# Patient Record
Sex: Female | Born: 1935 | Race: White | Hispanic: No | Marital: Married | Smoking: Former smoker
Health system: Southern US, Community
[De-identification: ages and names within clinical notes are randomized; demographics above are authoritative.]

## PROBLEM LIST (undated history)

## (undated) DIAGNOSIS — I6350 Cerebral infarction due to unspecified occlusion or stenosis of unspecified cerebral artery: Secondary | ICD-10-CM

## (undated) DIAGNOSIS — I10 Essential (primary) hypertension: Secondary | ICD-10-CM

## (undated) DIAGNOSIS — J449 Chronic obstructive pulmonary disease, unspecified: Secondary | ICD-10-CM

## (undated) DIAGNOSIS — I82409 Acute embolism and thrombosis of unspecified deep veins of unspecified lower extremity: Secondary | ICD-10-CM

## (undated) DIAGNOSIS — I4891 Unspecified atrial fibrillation: Secondary | ICD-10-CM

## (undated) DIAGNOSIS — G459 Transient cerebral ischemic attack, unspecified: Secondary | ICD-10-CM

## (undated) DIAGNOSIS — I679 Cerebrovascular disease, unspecified: Secondary | ICD-10-CM

## (undated) DIAGNOSIS — I499 Cardiac arrhythmia, unspecified: Secondary | ICD-10-CM

## (undated) DIAGNOSIS — K219 Gastro-esophageal reflux disease without esophagitis: Secondary | ICD-10-CM

## (undated) DIAGNOSIS — I714 Abdominal aortic aneurysm, without rupture: Secondary | ICD-10-CM

## (undated) DIAGNOSIS — I739 Peripheral vascular disease, unspecified: Secondary | ICD-10-CM

## (undated) DIAGNOSIS — I6529 Occlusion and stenosis of unspecified carotid artery: Secondary | ICD-10-CM

## (undated) HISTORY — DX: Essential (primary) hypertension: I10

## (undated) HISTORY — DX: Acute embolism and thrombosis of unspecified deep veins of unspecified lower extremity: I82.409

## (undated) HISTORY — DX: Peripheral vascular disease, unspecified: I73.9

## (undated) HISTORY — DX: Gastro-esophageal reflux disease without esophagitis: K21.9

## (undated) HISTORY — DX: Unspecified atrial fibrillation: I48.91

## (undated) HISTORY — PX: PARTIAL HYSTERECTOMY: SHX80

## (undated) HISTORY — PX: GALLBLADDER SURGERY: SHX652

## (undated) HISTORY — DX: Cerebrovascular disease, unspecified: I67.9

## (undated) HISTORY — DX: Transient cerebral ischemic attack, unspecified: G45.9

## (undated) HISTORY — DX: Cardiac arrhythmia, unspecified: I49.9

## (undated) HISTORY — DX: Chronic obstructive pulmonary disease, unspecified: J44.9

---
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
## 1964-07-08 HISTORY — PX: CHOLECYSTECTOMY: SHX55

## 2000-08-19 ENCOUNTER — Encounter: Payer: Self-pay | Admitting: Cardiovascular Disease

## 2000-08-19 ENCOUNTER — Ambulatory Visit (HOSPITAL_COMMUNITY): Admission: RE | Admit: 2000-08-19 | Discharge: 2000-08-20 | Payer: Self-pay | Admitting: Cardiovascular Disease

## 2001-01-29 ENCOUNTER — Ambulatory Visit (HOSPITAL_COMMUNITY): Admission: RE | Admit: 2001-01-29 | Discharge: 2001-01-29 | Payer: Self-pay | Admitting: *Deleted

## 2001-01-29 ENCOUNTER — Encounter: Payer: Self-pay | Admitting: *Deleted

## 2001-05-19 ENCOUNTER — Encounter: Payer: Self-pay | Admitting: Family Medicine

## 2001-05-19 ENCOUNTER — Ambulatory Visit (HOSPITAL_COMMUNITY): Admission: RE | Admit: 2001-05-19 | Discharge: 2001-05-19 | Payer: Self-pay | Admitting: Family Medicine

## 2002-01-11 ENCOUNTER — Ambulatory Visit (HOSPITAL_COMMUNITY): Admission: RE | Admit: 2002-01-11 | Discharge: 2002-01-11 | Payer: Self-pay | Admitting: Family Medicine

## 2002-01-11 ENCOUNTER — Encounter: Payer: Self-pay | Admitting: Family Medicine

## 2002-02-23 ENCOUNTER — Ambulatory Visit (HOSPITAL_COMMUNITY): Admission: RE | Admit: 2002-02-23 | Discharge: 2002-02-23 | Payer: Self-pay | Admitting: Family Medicine

## 2002-02-23 ENCOUNTER — Encounter: Payer: Self-pay | Admitting: Family Medicine

## 2002-05-14 ENCOUNTER — Encounter: Payer: Self-pay | Admitting: Internal Medicine

## 2002-05-15 ENCOUNTER — Observation Stay (HOSPITAL_COMMUNITY): Admission: EM | Admit: 2002-05-15 | Discharge: 2002-05-16 | Payer: Self-pay | Admitting: Internal Medicine

## 2002-08-24 ENCOUNTER — Ambulatory Visit (HOSPITAL_COMMUNITY): Admission: RE | Admit: 2002-08-24 | Discharge: 2002-08-24 | Payer: Self-pay | Admitting: Internal Medicine

## 2002-08-24 ENCOUNTER — Encounter: Payer: Self-pay | Admitting: Internal Medicine

## 2002-08-27 ENCOUNTER — Encounter: Payer: Self-pay | Admitting: Internal Medicine

## 2002-08-27 ENCOUNTER — Ambulatory Visit (HOSPITAL_COMMUNITY): Admission: RE | Admit: 2002-08-27 | Discharge: 2002-08-27 | Payer: Self-pay | Admitting: Internal Medicine

## 2003-08-07 ENCOUNTER — Inpatient Hospital Stay (HOSPITAL_COMMUNITY): Admission: EM | Admit: 2003-08-07 | Discharge: 2003-08-09 | Payer: Self-pay | Admitting: Emergency Medicine

## 2004-04-25 ENCOUNTER — Emergency Department (HOSPITAL_COMMUNITY): Admission: EM | Admit: 2004-04-25 | Discharge: 2004-04-25 | Payer: Self-pay | Admitting: Emergency Medicine

## 2004-12-09 ENCOUNTER — Emergency Department (HOSPITAL_COMMUNITY): Admission: EM | Admit: 2004-12-09 | Discharge: 2004-12-09 | Payer: Self-pay | Admitting: Emergency Medicine

## 2004-12-17 ENCOUNTER — Ambulatory Visit (HOSPITAL_COMMUNITY): Admission: RE | Admit: 2004-12-17 | Discharge: 2004-12-17 | Payer: Self-pay | Admitting: Internal Medicine

## 2004-12-21 ENCOUNTER — Emergency Department (HOSPITAL_COMMUNITY): Admission: EM | Admit: 2004-12-21 | Discharge: 2004-12-21 | Payer: Self-pay | Admitting: Emergency Medicine

## 2005-01-27 ENCOUNTER — Emergency Department (HOSPITAL_COMMUNITY): Admission: EM | Admit: 2005-01-27 | Discharge: 2005-01-27 | Payer: Self-pay | Admitting: Emergency Medicine

## 2005-03-04 ENCOUNTER — Ambulatory Visit (HOSPITAL_COMMUNITY): Admission: RE | Admit: 2005-03-04 | Discharge: 2005-03-04 | Payer: Self-pay | Admitting: Internal Medicine

## 2005-05-19 ENCOUNTER — Emergency Department (HOSPITAL_COMMUNITY): Admission: EM | Admit: 2005-05-19 | Discharge: 2005-05-19 | Payer: Self-pay | Admitting: Emergency Medicine

## 2005-08-19 ENCOUNTER — Ambulatory Visit: Payer: Self-pay | Admitting: Cardiology

## 2005-09-03 ENCOUNTER — Ambulatory Visit: Payer: Self-pay

## 2005-09-10 ENCOUNTER — Ambulatory Visit: Payer: Self-pay | Admitting: Cardiology

## 2005-10-01 ENCOUNTER — Ambulatory Visit: Payer: Self-pay | Admitting: Cardiology

## 2006-08-07 ENCOUNTER — Emergency Department (HOSPITAL_COMMUNITY): Admission: EM | Admit: 2006-08-07 | Discharge: 2006-08-07 | Payer: Self-pay | Admitting: Emergency Medicine

## 2007-01-21 ENCOUNTER — Ambulatory Visit: Payer: Self-pay | Admitting: Cardiology

## 2007-03-24 ENCOUNTER — Ambulatory Visit: Payer: Self-pay | Admitting: Cardiology

## 2007-10-22 ENCOUNTER — Ambulatory Visit: Payer: Self-pay | Admitting: Cardiology

## 2007-10-22 ENCOUNTER — Ambulatory Visit: Payer: Self-pay

## 2008-03-21 ENCOUNTER — Ambulatory Visit: Payer: Self-pay | Admitting: Cardiology

## 2008-03-23 ENCOUNTER — Ambulatory Visit (HOSPITAL_COMMUNITY): Admission: RE | Admit: 2008-03-23 | Discharge: 2008-03-23 | Payer: Self-pay | Admitting: Cardiology

## 2008-03-25 ENCOUNTER — Ambulatory Visit: Payer: Self-pay | Admitting: Cardiology

## 2008-05-29 ENCOUNTER — Emergency Department (HOSPITAL_COMMUNITY): Admission: EM | Admit: 2008-05-29 | Discharge: 2008-05-29 | Payer: Self-pay | Admitting: Emergency Medicine

## 2008-06-21 ENCOUNTER — Encounter: Payer: Self-pay | Admitting: Internal Medicine

## 2008-10-25 DIAGNOSIS — M545 Low back pain, unspecified: Secondary | ICD-10-CM | POA: Insufficient documentation

## 2008-10-25 DIAGNOSIS — I6350 Cerebral infarction due to unspecified occlusion or stenosis of unspecified cerebral artery: Secondary | ICD-10-CM | POA: Insufficient documentation

## 2008-10-25 DIAGNOSIS — I4891 Unspecified atrial fibrillation: Secondary | ICD-10-CM | POA: Insufficient documentation

## 2008-10-25 HISTORY — DX: Cerebral infarction due to unspecified occlusion or stenosis of unspecified cerebral artery: I63.50

## 2008-10-26 ENCOUNTER — Ambulatory Visit: Payer: Self-pay | Admitting: Cardiology

## 2008-11-02 ENCOUNTER — Ambulatory Visit: Payer: Self-pay

## 2008-11-02 ENCOUNTER — Encounter: Payer: Self-pay | Admitting: Cardiology

## 2009-02-15 ENCOUNTER — Ambulatory Visit: Payer: Self-pay | Admitting: Orthopedic Surgery

## 2009-02-15 DIAGNOSIS — M7020 Olecranon bursitis, unspecified elbow: Secondary | ICD-10-CM

## 2009-02-22 ENCOUNTER — Encounter (INDEPENDENT_AMBULATORY_CARE_PROVIDER_SITE_OTHER): Payer: Self-pay | Admitting: *Deleted

## 2009-04-19 ENCOUNTER — Ambulatory Visit: Payer: Self-pay | Admitting: Cardiology

## 2009-11-01 ENCOUNTER — Ambulatory Visit: Payer: Self-pay | Admitting: Cardiology

## 2009-11-03 ENCOUNTER — Encounter: Payer: Self-pay | Admitting: Cardiology

## 2009-11-06 ENCOUNTER — Encounter: Payer: Self-pay | Admitting: Cardiology

## 2009-11-06 DIAGNOSIS — I6529 Occlusion and stenosis of unspecified carotid artery: Secondary | ICD-10-CM

## 2009-11-07 ENCOUNTER — Ambulatory Visit: Payer: Self-pay

## 2009-11-07 ENCOUNTER — Encounter: Payer: Self-pay | Admitting: Cardiology

## 2009-11-12 LAB — CONVERTED CEMR LAB
BUN: 13 mg/dL (ref 6–23)
CO2: 30 meq/L (ref 19–32)
Calcium: 8.8 mg/dL (ref 8.4–10.5)
Chloride: 107 meq/L (ref 96–112)
Creatinine, Ser: 1.06 mg/dL (ref 0.40–1.20)
Glucose, Bld: 148 mg/dL — ABNORMAL HIGH (ref 70–99)
Potassium: 4.4 meq/L (ref 3.5–5.3)
Sodium: 144 meq/L (ref 135–145)

## 2009-11-17 ENCOUNTER — Encounter: Payer: Self-pay | Admitting: Cardiology

## 2009-11-17 ENCOUNTER — Telehealth (INDEPENDENT_AMBULATORY_CARE_PROVIDER_SITE_OTHER): Payer: Self-pay | Admitting: *Deleted

## 2009-11-21 ENCOUNTER — Telehealth: Payer: Self-pay | Admitting: Cardiology

## 2009-12-05 ENCOUNTER — Encounter: Payer: Self-pay | Admitting: Cardiology

## 2009-12-06 LAB — CONVERTED CEMR LAB
HCT: 52.4 % — ABNORMAL HIGH (ref 36.0–46.0)
Hemoglobin: 17.2 g/dL — ABNORMAL HIGH (ref 12.0–15.0)
MCHC: 32.8 g/dL (ref 30.0–36.0)
MCV: 93.7 fL (ref 78.0–100.0)
Platelets: 187 10*3/uL (ref 150–400)
RBC: 5.59 M/uL — ABNORMAL HIGH (ref 3.87–5.11)
RDW: 14.8 % (ref 11.5–15.5)
WBC: 8.2 10*3/uL (ref 4.0–10.5)

## 2010-02-23 ENCOUNTER — Inpatient Hospital Stay (HOSPITAL_COMMUNITY): Admission: EM | Admit: 2010-02-23 | Discharge: 2010-02-25 | Payer: Self-pay | Admitting: Emergency Medicine

## 2010-05-15 ENCOUNTER — Telehealth: Payer: Self-pay | Admitting: Cardiology

## 2010-05-16 ENCOUNTER — Ambulatory Visit: Payer: Self-pay

## 2010-05-16 ENCOUNTER — Ambulatory Visit: Payer: Self-pay | Admitting: Cardiology

## 2010-05-16 DIAGNOSIS — R609 Edema, unspecified: Secondary | ICD-10-CM

## 2010-05-16 DIAGNOSIS — F17200 Nicotine dependence, unspecified, uncomplicated: Secondary | ICD-10-CM

## 2010-05-17 DIAGNOSIS — I739 Peripheral vascular disease, unspecified: Secondary | ICD-10-CM

## 2010-05-24 ENCOUNTER — Telehealth: Payer: Self-pay | Admitting: Cardiology

## 2010-05-28 ENCOUNTER — Ambulatory Visit: Payer: Self-pay | Admitting: Cardiovascular Disease

## 2010-05-28 DIAGNOSIS — I70229 Atherosclerosis of native arteries of extremities with rest pain, unspecified extremity: Secondary | ICD-10-CM | POA: Insufficient documentation

## 2010-06-04 ENCOUNTER — Ambulatory Visit: Payer: Self-pay | Admitting: Cardiovascular Disease

## 2010-06-04 ENCOUNTER — Ambulatory Visit: Payer: Self-pay

## 2010-06-22 ENCOUNTER — Ambulatory Visit (HOSPITAL_COMMUNITY)
Admission: RE | Admit: 2010-06-22 | Discharge: 2010-06-22 | Payer: Self-pay | Source: Home / Self Care | Attending: Internal Medicine | Admitting: Internal Medicine

## 2010-08-07 NOTE — Assessment & Plan Note (Signed)
Summary: f/u lea and aorta   Visit Type:  Follow-up Referring [place]:  Dr Sherwood Gambler  Primary Maytal Mijangos:  Dr. Sherwood Gambler  CC:  Bilateral leg pain- edema.  History of Present Illness: 76 year-old woman presenting for followup of lower extremity PAD. She was seen recently and returned for noninvasive studies today. She has undergone left SFA stenting in 2002 with overlapping stents. She has developed left leg swelling and bilateral foot pain and redness. Also complains of left calf cramping with walking. If she walks slowly, she can walk about one city block. No ulcerations.  Current Medications (verified): 1)  Hydrochlorothiazide 12.5 Mg Tabs (Hydrochlorothiazide) .... Take One Tablet By Mouth Daily. 2)  Calan Sr 240 Mg Cr-Tabs (Verapamil Hcl) .Marland Kitchen.. 1 By Mouth Daiy 3)  Cilostazol 100 Mg Tabs (Cilostazol) .Marland Kitchen.. 1 By Mouth Daily 4)  Pradaxa 150 Mg Caps (Dabigatran Etexilate Mesylate) .... One Twice A Day: Do Not Expose To Light  Allergies: 1)  ! * Oxycodone 2)  ! Codeine 3)  ! Nubain 4)  ! Tylox 5)  ! Ibuprofen 6)  ! Cephalexin 7)  ! Motrin 8)  ! Amoxicillin 9)  ! * Enbrel 10)  ! * Gualen 11)  ! * Roxicodone 12)  ! Ativan 13)  ! * Advair 14)  ! * Albuterol  Past History:  Past medical history reviewed for relevance to current acute and chronic problems.  Past Medical History: Reviewed history from 05/28/2010 and no changes required. HTN GERD Cerebrovascular disease Peripheral vascular disease (40-59% left carotid stenosis, 0-39% right) Lower extremity PAD with bilateral leg stenting TIAs COPD  Vital Signs:  Patient profile:   76 year old female Height:      64 inches Weight:      136 pounds BMI:     23.43 Pulse rate:   94 / minute Pulse rhythm:   regular Resp:     18 per minute BP sitting:   124 / 74  (left arm) Cuff size:   large  Vitals Entered By: Vikki Ports (June 04, 2010 2:29 PM)  Serial Vital Signs/Assessments:  Time      Position  BP       Pulse  Resp   Temp     By           R Arm     126/72                         Vikki Ports   Physical Exam  General:  Pt is an elderly woman, alert and oriented, no acute distress HEENT: normal Lungs: CTA Chest: equal expansion - scattered rhonchi CV:  irregular without murmur or gallop Abd: soft, NT, positive BS, no HSM, no bruit Back: no CVA tenderness Ext: 1+ left pretibial edema, improved from last visit        femoral pulses diminished bilaterally        pedal pulses nonpalpable       Skin: dependent rubor bilaterally, callus formation both feet     Arterial Doppler  Procedure date:  06/04/2010  Findings:      Aortoiliac doppler: Small infrarenal AAA 2.9x3.1 cm, normal iliac arteries  ABI's  Procedure date:  06/04/2010  Findings:      Right: 0.96 with small vessel disease, TBI adequate for tissue healing Left: 0.69, long total occlusion of left SFA stent from prox to distal vessel (reconstitutes in Hunter's canal). Small vessel disease with no demonstrable flow in  the left 1st-3rd toes.  Impression & Recommendations:  Problem # 1:  ATHEROSCLEROSIS W/ REST PAIN (ICD-440.22) The patient has diffuse multilevel PAD, worse on the left. Her anatomy is unfavorable for PTA as the SFA has a long total occlusion. She does not have ulcers or nonhealing wounds of the feet. We discussed risks/potential benefits to angiography. I advised that her anatomy would likely warrant surgical revascularization. She would like to defer angiography for now. Plan followup in 6 months and will review again at that point. She was advised on continuing exquisite foot care to avoid foot injury or wounds.  Patient Instructions: 1)  Your physician recommends that you schedule a follow-up appointment in: 6 months with Dr. Excell Seltzer 2)  Your physician recommends that you continue on your current medications as directed. Please refer to the Current Medication list given to you today.

## 2010-08-07 NOTE — Miscellaneous (Signed)
Summary: Orders Update  Clinical Lists Changes  Medications: Added new medication of PRADAXA 150 MG CAPS (DABIGATRAN ETEXILATE MESYLATE) one twice a day: DO NOT EXPOSE TO LIGHT - Signed Rx of PRADAXA 150 MG CAPS (DABIGATRAN ETEXILATE MESYLATE) one twice a day: DO NOT EXPOSE TO LIGHT;  #60 x 6;  Signed;  Entered by: Charolotte Capuchin, RN;  Authorized by: Rollene Rotunda, MD, [facility];  Method used: Electronically to [facility] [address] [place], [place], [place]  [address], Ph: [phone], Fax: [phone]    Prescriptions: PRADAXA 150 MG CAPS (DABIGATRAN ETEXILATE MESYLATE) one twice a day: DO NOT EXPOSE TO LIGHT  #60 x 6   Entered by:   Charolotte Capuchin, RN   Authorized by:   Rollene Rotunda, MD, [facility]   Signed by:   Charolotte Capuchin, RN on 11/22/2009   Method used:   Electronically to        [facility] (retail)       [address]       [place], [place]  [address]       Ph: [phone]       Fax: [phone]   RxID:   [id number]

## 2010-08-07 NOTE — Progress Notes (Signed)
Summary: pv consult  Phone Note Call from Patient Call back at Home Phone [phone]   Caller: Patient Reason for Call: Talk to Nurse Details for Reason: pt check on status of her pv consult. Initial call taken by: Lorne Skeens,  May 24, 2010 1:23 PM  Follow-up for Phone Call        pt to be called with next avaiable appt for [facility] consult    Appt scheduled with Dr Excell Seltzer Follow-up by: Charolotte Capuchin, RN,  May 24, 2010 1:50 PM

## 2010-08-07 NOTE — Progress Notes (Signed)
Summary: PT/ INR results pt on new meds  Phone Note Call from Patient Call back at Home Phone [phone]   Caller: Patient Reason for Call: Talk to Nurse Summary of Call: Dr. Antoine Poche / Dr. Phillips Odor is putting pt on new meds. pt/inr does pt need to come in office/ Initial call taken by: Lorne Skeens,  Nov 21, 2009 12:26 PM  Follow-up for Phone Call        Pt's coumadin was decreased to 1mg  a day on Friday.  Per Dr Antoine Poche re check PT/INR and call us with results.  Pt aware we are in the [place] office 11/22/2009 Follow-up by: Charolotte Capuchin, RN,  Nov 21, 2009 3:33 PM

## 2010-08-07 NOTE — Miscellaneous (Signed)
Summary: Orders Update  Clinical Lists Changes  Problems: Added new problem of CAROTID ARTERY DISEASE (ICD-433.10) Orders: Added new Test order of Carotid Duplex (Carotid Duplex) - Signed 

## 2010-08-07 NOTE — Assessment & Plan Note (Signed)
Summary: occluded left sfa/edema--NPV   Visit Type:  Initial Consult Referring Provider:  Dr Sherwood Gambler  Primary Provider:  Dr. Sherwood Gambler  CC:  Leg swelling.  History of Present Illness: 76 year-old woman recently evaluated by Dr Antoine Poche for asymmetric lower extremity swelling, referred for evaluation of lower extremity PAD. She underwent a bilateral venous duplex study May 16, 2010 showing no evidence of DVT, but noted SFA stent occlusion. She presents today for further evaluation.  Has had SFA stenting in 2002 by Dr Alanda Amass and noted improvement in her walking capacity following that procedure. She has done well for several years until recently. She now notes increased left leg swelling over the past month, and also pain in the calf with rest and ambulation. She notes longstanding dependent rubor. She has developed calluses on her feet, but hasn't been to see a podiatrist. She denies ulcerations.     Current Medications (verified): 1)  Hydrochlorothiazide 12.5 Mg Tabs (Hydrochlorothiazide) .... Take One Tablet By Mouth Daily. 2)  Calan Sr 240 Mg Cr-Tabs (Verapamil Hcl) .Marland Kitchen.. 1 By Mouth Daiy 3)  Cilostazol 100 Mg Tabs (Cilostazol) .Marland Kitchen.. 1 By Mouth Daily 4)  Pradaxa 150 Mg Caps (Dabigatran Etexilate Mesylate) .... One Twice A Day: Do Not Expose To Light  Allergies: 1)  ! * Oxycodone 2)  ! Codeine 3)  ! Nubain 4)  ! Tylox 5)  ! Ibuprofen 6)  ! Cephalexin 7)  ! Motrin 8)  ! Amoxicillin 9)  ! * Enbrel 10)  ! * Gualen 11)  ! * Roxicodone 12)  ! Ativan 13)  ! * Advair  Past History:  Past medical, surgical, family and social histories (including risk factors) reviewed, and no changes noted (except as noted below).  Past Medical History: HTN GERD Cerebrovascular disease Peripheral vascular disease (40-59% left carotid stenosis, 0-39% right) Lower extremity PAD with bilateral leg stenting TIAs COPD  Past Surgical History: Reviewed history from 11/01/2009 and no changes  required. Appendix Gallbladder Partial hysterectomy  Family History: Reviewed history from 02/15/2009 and no changes required. na  Social History: Reviewed history from 02/15/2009 and no changes required. Patient is married.  unemployed  Review of Systems       Negative except as per HPI   Vital Signs:  Patient profile:   76 year old female Height:      64 inches Weight:      138 pounds BMI:     23.77 Pulse rate:   100 / minute Pulse rhythm:   irregular Resp:     18 per minute BP sitting:   120 / 80  (left arm) Cuff size:   large  Vitals Entered By: Vikki Ports (May 28, 2010 10:02 AM)  Serial Vital Signs/Assessments:  Time      Position  BP       Pulse  Resp  Temp     By           R Arm     128/84                         Vikki Ports   Physical Exam  General:  Pt is an elderly woman, alert and oriented, no acute distress HEENT: normal Neck: no thyromegaly           JVP normal, carotid upstrokes normal with soft bilateral bruits Lungs: CTA Chest: equal expansion - scattered rhonchi CV: Apical impulse nondisplaced, irregular without murmur or gallop Abd: soft, NT,  positive BS, no HSM, no bruit Back: no CVA tenderness Ext: 2+ left pretibial edema        femoral pulses diminished bilaterally        pedal pulses nonpalpable        doppler: poor signal on left PT, left DP monophasic. Right PT good signal, right DP decreased signal Skin: dependent rubor bilaterally, callus formation both feet Neuro: CNII-XII intact,strength 5/5 = b/l    Impression & Recommendations:  Problem # 1:  ATHEROSCLEROSIS W/ REST PAIN (ICD-440.22) Pt has hx PAD and progressive symptoms over past month suggestive of severe arterial insufficiency to her legs, left worse than right. She has diminished femoral pulses and I suspect she has multilevel arterial occlusive disease. Recommend abdominal aortic duplex scan to assess aortoiliac vessels and will do lower extremity duplex with  ABI's to assess peripheral arterial system. The patient is not on antiplatelet Rx because of systemic anticoagulation with pradaxa. She is tolerating cilostazol. Discussed tobacco cessation. Will followup same day as her arterial duplex to review treatment options.Marland KitchenMarland KitchenI suspect she will need an arteriogram.  Other Orders: Arterial Duplex Lower Extremity (Arterial Duplex Low) Abdominal Aorta Duplex (Abd Aorta Duplex)  Patient Instructions: 1)  Your physician recommends that you schedule a follow-up appointment in: 1 WEEK 2)  Your physician recommends that you continue on your current medications as directed. Please refer to the Current Medication list given to you today. 3)  Your physician has requested that you have an abdominal AORTA ILIAC duplex in 1 WEEK. During this test, an ultrasound is used to evaluate the aorta. Allow 30 minutes for this exam. Do not eat after midnight the day before and avoid carbonated beverages. There are no restrictions or special instructions. 4)  Your physician has requested that you have a lower extremity arterial duplex in 1 WEEK.  This test is an ultrasound of the arteries in the legs. It looks at arterial blood flow in the legs.  Allow one hour for Lower  Arterial scans. There are no restrictions or special instructions. 5)  Your physician has requested that you have an ankle brachial index (ABI) in 1 WEEK. During this test an ultrasound and blood pressure cuff are used to evaluate the arteries that supply the arms and legs with blood. Allow thirty minutes for this exam. There are no restrictions or special instructions.

## 2010-08-07 NOTE — Miscellaneous (Signed)
Summary: PV CONSULT  Clinical Lists Changes  Problems: Added new problem of PVD (ICD-443.9) Orders: Added new Referral order of PV Procedure (PV Procedure) - Signed

## 2010-08-07 NOTE — Progress Notes (Signed)
Summary: SWELLING IN FEET,HAVE BEEN TAKING LASIX line busy x 2  Phone Note Call from Patient Call back at Home Phone [phone]   Caller: Patient Summary of Call: SWELLING IN FEET FOR SIX WEEKS PT HAVE BEEN TAKING LASIX Initial call taken by: Judie Grieve,  May 15, 2010 3:39 PM  Follow-up for Phone Call        line busy X 2   Sander Nephew, RN  11/7/211 5:37 pm swelling in feet and legs up to her knee.  went to see PA at Dr Fusco's office. was put on Z-pack and Laxis but it hasn't helped.  Complains of redness in legs when they hang down and they hurt.  Appt given to pt for today at 12N. Follow-up by: Charolotte Capuchin, RN,  May 16, 2010 10:00 AM

## 2010-08-07 NOTE — Assessment & Plan Note (Signed)
Summary: [facility]   Visit Type:  Follow-up Primary Provider:  Dr. Sherwood Gambler  CC:  Atrial Fibrillation.  History of Present Illness: The patient presents for followup of atrial fibrillation. Since I last saw her she has had no new complaints. She denies any palpitations, presyncope or syncope. She has mild orthostatic symptoms. She denies any chest pressure, neck or arm discomfort. She has no new shortness of breath though she has chronic dyspnea related to cigarettes. She does not describe PND or orthopnea. She's had no new chest discomfort. She does not like to take Coumadin because of dietary restrictions in particular.  Current Medications (verified): 1)  Hydrochlorothiazide 25 Mg Tabs (Hydrochlorothiazide) .... 1/2 Podaily 2)  Coumadin 2 Mg Tabs (Warfarin Sodium) 3)  Calan Sr 240 Mg Cr-Tabs (Verapamil Hcl) 4)  Cilostazol 100 Mg Tabs (Cilostazol) .Marland Kitchen.. 1 By Mouth Daily  Allergies (verified): 1)  ! * Oxycodone 2)  ! Codeine 3)  ! Nubain 4)  ! Tylox 5)  ! Ibuprofen 6)  ! Cephalexin 7)  ! Motrin 8)  ! Amoxicillin 9)  ! * Enbrel 10)  ! * Gualen 11)  ! * Roxicodone 12)  ! Ativan 13)  ! * Advair  Past History:  Past Medical History: HTN GERD Cerebrovascular disease Peripheral vascular disease (40-59% left carotid stenosis, 0-39% right) TIAs COPD  Past Surgical History: Appendix Gallbladder Partial hysterectomy  Review of Systems       As stated in the HPI and negative for all other systems.   Vital Signs:  Patient profile:   76 year old female Height:      64 inches Weight:      136 pounds BMI:     23.43 Pulse rate:   98 / minute Resp:     16 per minute BP sitting:   124 / 70  (right arm)  Vitals Entered By: Marrion Coy, CNA (November 01, 2009 9:41 AM)  Physical Exam  General:  Well developed, well nourished, in no acute distress. Head:  normocephalic and atraumatic Eyes:  PERRLA/EOM intact; conjunctiva and lids normal. Mouth:  Oral mucosa  normal. Neck:  Neck supple, no JVD. No masses, thyromegaly or abnormal cervical nodes. Chest Wall:  no deformities or breast masses noted Lungs:  Clear bilaterally to auscultation and percussion. Heart:  S1 and S2 within normal limits, irregular, no S3, no clicks, no rubs, no murmurs Abdomen:  Bowel sounds positive; abdomen soft and non-tender without masses, organomegaly, or hernias noted. No hepatosplenomegaly. Msk:  Back normal, normal gait. Muscle strength and tone normal. Extremities:  No clubbing or cyanosis. Neurologic:  Alert and oriented x 3. Skin:  Intact without lesions or rashes. Psych:  Normal affect.    EKG  Procedure date:  11/01/2009  Findings:      Atrial fibrillation, rate 98, axis within normal limits, poor anterior R-wave progression, no acute ST-T wave changes  Impression & Recommendations:  Problem # 1:  FIBRILLATION, ATRIAL (ICD-427.31)  We discussed switching to Pradaxa since she does not like taking Coumadin. I will check her renal function before making this change. Otherwise she has good rate control.  Orders: EKG w/ Interpretation (93000)  Problem # 2:  EMPHYSEMA (ICD-492.8) We continue to discuss the need to stop smoking.  Patient Instructions: 1)  Your physician recommends that you schedule a follow-up appointment as directed 2)  Your physician recommends that you have lab work today basic metabolic panel 3)  Your physician recommends that you continue on your current  medications as directed. Please refer to the Current Medication list given to you today. 4)  You have been diagnosed with atrial fibrillation.  Atrial fibrillation is a condition in which one of the upper chambers of the heart has extra electrical cells causing it to beat very fast.  Please see the handout/brochure given to you today for further information.

## 2010-08-07 NOTE — Assessment & Plan Note (Signed)
Summary: [facility]   Visit Type:  Follow-up Primary Geena Weinhold:  Dr. Sherwood Gambler  CC:  Leg Pain.  History of Present Illness: The patient was added to my schedule today to evaluate leg swelling. This has been going on for about 4 weeks. He is more in the left greater than right  leg. She was treated with about 5 days of diuretics without improvement. She's also been treated for some upper respiratory infections and states she's had several Z-Paks. He was hospitalized in August for questionable pneumonia. She is on Pradaxa and she says she takes this religiously. She has not reported any fevers or chills. She has not reported any new chest pressure, neck or arm discomfort. She has had no PND or orthopnea. She has had some tenderness in her anterior pretibial area on the left greater than right leg.   Current Medications (verified): 1)  Hydrochlorothiazide 25 Mg Tabs (Hydrochlorothiazide) .Marland Kitchen.. 1 By Mouth Daily 2)  Calan Sr 240 Mg Cr-Tabs (Verapamil Hcl) .Marland Kitchen.. 1 By Mouth Daiy 3)  Cilostazol 100 Mg Tabs (Cilostazol) .Marland Kitchen.. 1 By Mouth Daily 4)  Pradaxa 150 Mg Caps (Dabigatran Etexilate Mesylate) .... One Twice A Day: Do Not Expose To Light  Allergies (verified): 1)  ! * Oxycodone 2)  ! Codeine 3)  ! Nubain 4)  ! Tylox 5)  ! Ibuprofen 6)  ! Cephalexin 7)  ! Motrin 8)  ! Amoxicillin 9)  ! * Enbrel 10)  ! * Gualen 11)  ! * Roxicodone 12)  ! Ativan 13)  ! * Advair  Past History:  Past Medical History: Reviewed history from 11/01/2009 and no changes required. HTN GERD Cerebrovascular disease Peripheral vascular disease (40-59% left carotid stenosis, 0-39% right) TIAs COPD  Past Surgical History: Reviewed history from 11/01/2009 and no changes required. Appendix Gallbladder Partial hysterectomy  Review of Systems       As stated in the HPI and negative for all other systems.   Vital Signs:  Patient profile:   76 year old female Height:      64 inches Weight:      139  pounds BMI:     23.95 Pulse rate:   90 / minute Resp:     18 per minute BP sitting:   108 / 76  (right arm)  Vitals Entered By: Marrion Coy, CNA (May 16, 2010 12:16 PM)  Physical Exam  General:  Well developed, well nourished, in no acute distress. Head:  normocephalic and atraumatic Eyes:  PERRLA/EOM intact; conjunctiva and lids normal. Mouth:  Oral mucosa normal. Neck:  Neck supple, no JVD. No masses, thyromegaly or abnormal cervical nodes. Chest Wall:  no deformities or breast masses noted Lungs:  Clear bilaterally to auscultation and percussion. Heart:  S1 and S2 within normal limits, irregular, no S3, no clicks, no rubs, no murmurs Abdomen:  Bowel sounds positive; abdomen soft and non-tender without masses, organomegaly, or hernias noted. No hepatosplenomegaly. Msk:  Back normal, normal gait. Muscle strength and tone normal. Extremities:  No clubbing or cyanosis, significant deep tendon rubor, left greater than right lower extremity swelling with no lower calf tenderness or erythema Skin:  Intact without lesions or rashes. Cervical Nodes:  no significant adenopathy Inguinal Nodes:  no significant adenopathy Psych:  Normal affect.   EKG  Procedure date:  05/16/2010  Findings:      atrial fibrillation, right axis deviation, poor anterior R-wave progression, no acute ST-T wave changes  Impression & Recommendations:  Problem # 1:  EDEMA (ICD-782.3) I am more concerned about possible DVT even though she's been on blood thinner.  Her edema or swelling is much greater on the left than right. I will start with venous Dopplers. Further evaluation will be based on this result. Orders: Venous Duplex Lower Extremity (Venous Duplex Lower)  Problem # 2:  FIBRILLATION, ATRIAL (ICD-427.31) She remains on rate control and anticoagulation Orders: EKG w/ Interpretation (93000)  Problem # 3:  TOBACCO ABUSE (ICD-305.1) As always I told her to stop smoking  Patient  Instructions: 1)  Your physician recommends that you schedule a follow-up appointment as scheduled 2)  Your physician recommends that you continue on your current medications as directed. Please refer to the Current Medication list given to you today. 3)  Your physician has requested that you have a lower or upper extremity venous duplex.  This test is an ultrasound of the veins in the legs or arms.  It looks at venous blood flow that carries blood from the heart to the legs or arms.  Allow one hour for a Lower Venous exam.  Allow thirty minutes for an Upper Venous exam. There are no restrictions or special instructions.  To be done at [facility]

## 2010-08-07 NOTE — Progress Notes (Signed)
Summary: lab results  Phone Note Call from Patient Call back at Home Phone [phone]   Caller: Patient Reason for Call: Lab or Test Results Summary of Call: Call later this evening will not be home this morning Initial call taken by: Judie Grieve,  Nov 17, 2009 11:17 AM  Follow-up for Phone Call        11/17/09--11am--pt calling stating dr hochrein wanted her to start prdaxa if lab work comes back OK--reviewed lab which appears to be WNL --pt states she does not have RX for pradaxa and is on her way to have PT/INR drawn--advised will review lab with dr hochrein and get back to her about pradaxa--nt 11/17/09  5:10pm-awaiting instructions from dr hochrein--will notify pt of this and to call back 5/16 Follow-up by: Ledon Snare, RN,  Nov 17, 2009 5:14 PM  Additional Follow-up for Phone Call Additional follow up Details #1::        I need to know this most recent PT.  Then I can advise her on starting the Pradaxa. Additional Follow-up by: Rollene Rotunda, MD, [facility],  Nov 17, 2009 5:17 PM    Additional Follow-up for Phone Call Additional follow up Details #2::    11/17/09--5pm--phoned pt to find out what PT was today--she states she doesn't remember but whoever checked it-?PCP--stated it was high and he mite decrease warfarin to one mg--he also thought it was a good idea for her to start pradaxa--pt states she will call PCP on monday and have results faxed to us--dr hochrein aware of all above--nt Follow-up by: Ledon Snare, RN,  Nov 17, 2009 5:38 PM

## 2010-09-20 LAB — DIFFERENTIAL
Basophils Absolute: 0 10*3/uL (ref 0.0–0.1)
Basophils Absolute: 0 10*3/uL (ref 0.0–0.1)
Basophils Absolute: 0 10*3/uL (ref 0.0–0.1)
Basophils Relative: 0 % (ref 0–1)
Basophils Relative: 1 % (ref 0–1)
Basophils Relative: 1 % (ref 0–1)
Eosinophils Absolute: 0.1 10*3/uL (ref 0.0–0.7)
Eosinophils Relative: 0 % (ref 0–5)
Eosinophils Relative: 2 % (ref 0–5)
Eosinophils Relative: 2 % (ref 0–5)
Lymphs Abs: 2.3 10*3/uL (ref 0.7–4.0)
Monocytes Absolute: 0.5 10*3/uL (ref 0.1–1.0)
Monocytes Absolute: 0.6 10*3/uL (ref 0.1–1.0)
Monocytes Relative: 7 % (ref 3–12)
Neutro Abs: 5.6 10*3/uL (ref 1.7–7.7)
Neutro Abs: 6 10*3/uL (ref 1.7–7.7)

## 2010-09-20 LAB — APTT: aPTT: 33 seconds (ref 24–37)

## 2010-09-20 LAB — CARDIAC PANEL(CRET KIN+CKTOT+MB+TROPI)
CK, MB: 1.8 ng/mL (ref 0.3–4.0)
Relative Index: INVALID (ref 0.0–2.5)
Relative Index: INVALID (ref 0.0–2.5)
Total CK: 41 U/L (ref 7–177)
Total CK: 47 U/L (ref 7–177)
Troponin I: 0.02 ng/mL (ref 0.00–0.06)

## 2010-09-20 LAB — URINALYSIS, ROUTINE W REFLEX MICROSCOPIC
Bilirubin Urine: NEGATIVE
Glucose, UA: NEGATIVE mg/dL
Protein, ur: NEGATIVE mg/dL
Specific Gravity, Urine: 1.01 (ref 1.005–1.030)

## 2010-09-20 LAB — CBC
HCT: 45.9 % (ref 36.0–46.0)
HCT: 52 % — ABNORMAL HIGH (ref 36.0–46.0)
Hemoglobin: 17.3 g/dL — ABNORMAL HIGH (ref 12.0–15.0)
MCH: 30 pg (ref 26.0–34.0)
MCH: 30.2 pg (ref 26.0–34.0)
MCHC: 32.8 g/dL (ref 30.0–36.0)
MCHC: 33.3 g/dL (ref 30.0–36.0)
MCHC: 33.3 g/dL (ref 30.0–36.0)
MCV: 89.7 fL (ref 78.0–100.0)
MCV: 90.7 fL (ref 78.0–100.0)
MCV: 91.2 fL (ref 78.0–100.0)
Platelets: 179 10*3/uL (ref 150–400)
Platelets: 196 10*3/uL (ref 150–400)
RBC: 5.74 MIL/uL — ABNORMAL HIGH (ref 3.87–5.11)
RDW: 14.4 % (ref 11.5–15.5)
RDW: 14.4 % (ref 11.5–15.5)
RDW: 14.8 % (ref 11.5–15.5)
WBC: 8.9 10*3/uL (ref 4.0–10.5)

## 2010-09-20 LAB — BASIC METABOLIC PANEL
BUN: 10 mg/dL (ref 6–23)
BUN: 10 mg/dL (ref 6–23)
CO2: 32 mEq/L (ref 19–32)
Calcium: 8.8 mg/dL (ref 8.4–10.5)
Calcium: 9 mg/dL (ref 8.4–10.5)
Chloride: 98 mEq/L (ref 96–112)
Creatinine, Ser: 0.71 mg/dL (ref 0.4–1.2)
Creatinine, Ser: 0.75 mg/dL (ref 0.4–1.2)
GFR calc Af Amer: >60 mL/min (ref >60)
GFR calc Af Amer: >60 mL/min (ref >60)
GFR calc non Af Amer: >60 mL/min (ref >60)
GFR calc non Af Amer: >60 mL/min (ref >60)
GFR calc non Af Amer: >60 mL/min (ref >60)
Glucose, Bld: 104 mg/dL — ABNORMAL HIGH (ref 70–99)
Glucose, Bld: 151 mg/dL — ABNORMAL HIGH (ref 70–99)
Potassium: 3.8 mEq/L (ref 3.5–5.1)
Potassium: 4.3 mEq/L (ref 3.5–5.1)
Sodium: 140 mEq/L (ref 135–145)
Sodium: 141 mEq/L (ref 135–145)

## 2010-09-20 LAB — PROTIME-INR
INR: 1.3 (ref 0.00–1.49)
Prothrombin Time: 16.4 seconds — ABNORMAL HIGH (ref 11.6–15.2)

## 2010-09-20 LAB — URINE MICROSCOPIC-ADD ON

## 2010-09-20 LAB — BRAIN NATRIURETIC PEPTIDE
Pro B Natriuretic peptide (BNP): 217 pg/mL — ABNORMAL HIGH (ref 0.0–100.0)
Pro B Natriuretic peptide (BNP): 225 pg/mL — ABNORMAL HIGH (ref 0.0–100.0)

## 2010-09-20 LAB — EXPECTORATED SPUTUM ASSESSMENT W GRAM STAIN, RFLX TO RESP C

## 2010-09-20 LAB — POCT CARDIAC MARKERS
CKMB, poc: <1 ng/mL — ABNORMAL LOW (ref 1.0–8.0)
CKMB, poc: <1 ng/mL — ABNORMAL LOW (ref 1.0–8.0)
Myoglobin, poc: 30.6 ng/mL (ref 12–200)
Troponin i, poc: <0.05 ng/mL (ref 0.00–0.09)

## 2010-11-20 NOTE — Assessment & Plan Note (Signed)
[facility]                            CARDIOLOGY OFFICE NOTE   Caroline [name], Caroline [name]                       MRN:          [id number]  DATE:10/22/2007                            DOB:          17-Jan-1935    PRIMARY:  Dr. Sherwood Gambler.   REASON FOR PRESENTATION:  Evaluate the patient with peripheral vascular  disease and atrial fibrillation.   HISTORY OF PRESENT ILLNESS:  The patient returns for followup.  She had  done well from a cardiovascular standpoint since I last saw her.  She is  actually not describing much in the way of leg discomfort.  She tries to  walk as much as she can.  She is her Pletal.  She had atrial  fibrillation but really does not notice any palpitations.  She had no  presyncope or syncope.  She has had no new dyspnea.  She has no chest  discomfort, neck or arm discomfort.  Unfortunately, she still continues  to smoke cigarettes.  Says she is not doing well simply because she is  so stressed.  She worries about her family a great deal.   PAST MEDICAL HISTORY:  Emphysema, reflux, cerebrovascular accident with  residual weakness in her index and middle fingers on the right hand,  permanent atrial fibrillation on chronic Coumadin therapy, lower back  pain, hysterectomy, cholecystectomy, appendectomy.   ALLERGIES:  Intolerances CODEINE, NUBAIN, TYLOX, CAPOTEN, CEPHALEXIN,  SULFA, AMOXICILLIN , ENTEX, GUAIFENESIN, OXYCODONE, ATIVAN.   MEDICATIONS:  Hydrochlorothiazide 12.5 mg daily, verapamil 240 mg daily,  Coumadin per Dr. Sherwood Gambler, Pletal 100 mg b.i.d.   REVIEW OF SYSTEMS:  As stated in HPI otherwise negative for other  systems.   PHYSICAL EXAMINATION:  The patient is in no distress.  Blood pressure 100/72, heart rate 79 and regular.  HEENT:  Eyelids unremarkable.  Pupils are equal, round, and reactive to  light and accommodation.  Fundi are not visualized.  Oral mucosa  unremarkable.  NECK:  No jugular venous distension at 45 degrees,  carotid upstroke  brisk and symmetric, no bruits, thyromegaly.  LYMPHATICS:  No adenopathy.  LUNGS:  Clear to auscultation bilaterally.  BACK:  No costovertebral angle tenderness.  CHEST:  Unremarkable.  HEART:  PMI not displaced or sustained, S1 and S2 within normal limits,  no S3, no murmurs.  ABDOMEN:  Flat, positive bowel sounds, normal in frequency and pitch, no  bruits, rebound, guarding.  No midline pulsatile masses, hepatomegaly,  splenomegaly.  SKIN:  No rashes, no nodules.  EXTREMITIES:  With 2+ pulses upper pulses, 1+ posterior tibialis  bilaterally, no cyanosis, clubbing, dependent rubor, no edema.  NEURO:  Grossly intact.   EKG atrial fibrillation, left axis deviation, intervals within normal  limits, no acute ST-wave changes.   ASSESSMENT:  1. Peripheral vascular disease.  Patient is doing better on the      current dose of Pletal.  No further cardiovascular testing is      suggested.  She is encouraged to walk to continue to try to improve      symptoms.  2. Tobacco.  She says she  cannot quit smoking.  She is to stressed.      We discussed this again today.  3. Atrial fibrillation.  The patient is maintaining a reasonable rate.      She is on Coumadin.  She is not having any symptoms.  No change in      therapy is indicated.  4. Hypertension.  Blood pressure is well-controlled.  She will      continue other medications as listed.  5. Followup.  Will see back in 6 months or sooner if needed.  6. Carotid stenosis.  The patient did have carotid Doppler today.      This demonstrates moderate carotid plaque with 0-39% right stenosis      and 40-59% left stenosis.  Will follow this up in the year.     Rollene Rotunda, MD, [facility]  Electronically Signed    JH/MedQ  DD: 10/22/2007  DT: 10/22/2007  Job #: [id number]   cc:   Madelin Rear. Sherwood Gambler, MD

## 2010-11-20 NOTE — Assessment & Plan Note (Signed)
[facility]                            CARDIOLOGY OFFICE NOTE   Caroline [name], Caroline [name]                       MRN:          [id number]  DATE:10/26/2008                            DOB:          1935-05-11    PRIMARY CARE PHYSICIAN:  Madelin Rear. Fusco, MD   REASON FOR PRESENTATION:  Evaluate the patient with peripheral vascular  disease and atrial fibrillation.   HISTORY OF PRESENT ILLNESS:  The patient is 76 years old.  She presents  for followup of the above.  Since I last saw her, she has had no new  complaints.  She continues to smoke cigarettes and has no desire to  quit.  She does not have any new shortness of breath.  She denies any  PND or orthopnea.  She has had no palpitations, presyncope, or syncope.  She has had no chest discomfort, neck or arm discomfort.  She has lower  extremity claudication, but this is much improved on Pletal.   PAST MEDICAL HISTORY:  1. Emphysema.  2. Gastroesophageal reflux disease.  3. Cerebrovascular accident with residual weakness in her index and      middle fingers on the right hand.  4. Permanent atrial fibrillation.  5. Low back pain.  6. Peripheral vascular disease with reduced ABIs (she did have      stenting of her left superficial femoral artery in 2002 by Dr.      Alanda Amass).  7. Chronic low back pain.  8. Hysterectomy.  9. Cholecystectomy.  10.Appendectomy.   ALLERGIES AND INTOLERANCES:  CODEINE, NUBAIN, TYLOX, CAPOTEN,  CEPHALEXIN, SULFA, AMOXICILLIN, ENTEX, FLUPHENAZINE, OXYCODONE, and  ATIVAN.   MEDICATIONS:  1. Hydrochlorothiazide 12.5 mg daily.  2. Verapamil 240 mg daily.  3. Coumadin.  4. Pletal 50 mg b.i.d.   REVIEW OF SYSTEMS:  As stated in the HPI and otherwise negative for all  other systems.   PHYSICAL EXAMINATION:  GENERAL:  The patient is in no distress.  VITAL SIGNS:  Blood pressure 138/78 and heart rate 101 and regular.  NECK:  No jugular venous distention at 45 degrees,  carotid upstroke  brisk and symmetric, no bruits, no thyromegaly.  LYMPHATICS:  No adenopathy.  LUNGS:  Clear to auscultation bilaterally.  BACK:  No costovertebral angle tenderness.  CHEST:  Unremarkable.  HEART:  PMI not displaced or sustained; S1 and S2 within normal limits,  no S3, no S4; no clicks, no rubs, no murmurs.  ABDOMEN:  Flat; positive bowel sounds, normal in frequency and pitch; no  bruits, no rebound, no guarding, no midline pulsatile mass; no  organomegaly.  SKIN:  No rashes, no nodules.  EXTREMITIES:  Upper pulses 2+, 1+ posterior tibialis bilaterally, no  cyanosis, no clubbing.  NEURO:  Grossly intact.   EKG, atrial fibrillation, rate 100, axis within normal limits, intervals  within normal limits, poor anterior R-wave progression.   ASSESSMENT AND PLAN:  1. Atrial fibrillation.  The patient is tolerating this.  She has      reasonable rate control.  She is tolerating Coumadin.  No further  therapy is planned.  2. Tobacco.  She has no desire to quit smoking and we have talked      about this at length in the past.  3. Peripheral vascular disease.  Symptomatically, she is improved with      Pletal and she will continue this.  4. Risk reduction.  She has not tolerated statins in the past.  I      doubt that she is going to change her diet.  I will defer      management to Dr. Sherwood Gambler.  5. Followup.  I will see her back in 6 months or sooner.     Rollene Rotunda, MD, [facility]  Electronically Signed    JH/MedQ  DD: 10/26/2008  DT: 10/27/2008  Job #: [id number]   cc:   Madelin Rear. Sherwood Gambler, MD

## 2010-11-20 NOTE — Consult Note (Signed)
Caroline [name], Caroline [name]                ACCOUNT NO.:  [id number]   MEDICAL RECORD NO.:  [id number]          PATIENT TYPE:  OUT   LOCATION:  XRAY                         FACILITY:  [facility]   PHYSICIAN:  Sanjeev K. Deveshwar, M.D.DATE OF BIRTH:  [birth year]   DATE OF CONSULTATION:  06/21/2008  DATE OF DISCHARGE:                                 CONSULTATION   CHIEF COMPLAINT:  Cerebrovascular disease.   HISTORY OF PRESENT ILLNESS:  This is a very pleasant 76 year old female  referred to Dr. Corliss Skains through the courtesy of Dr. Sherwood Gambler.  The  patient has a history of having had a left CVA in 2006 at which time she  was treated at [facility].  She had right-sided weakness, however,  this significantly improved and now she just has mild residual weakness  of the right hand.  She was recently seen at the emergency department at  [facility] on May 29, 2008 after she developed left facial  numbness and drawing of the left side of her face.  He felt  presyncopal and nauseated.  It was felt that she had a TIA.  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  She also had a mid  left M1 segment stenosis with marked diminished right posterior cerebral  artery flow.  She has been referred to Dr. Corliss Skains for further  evaluation.  She presents today accompanied by her husband and one of  her daughters.  She brought a disk containing her recent MRI.   PAST MEDICAL HISTORY:  Significant for:  1. Diabetes mellitus which is diet-controlled.  2. Hyperlipidemia.  3. As noted, she had a CVA in 2006 with mild residual right-sided      weakness.  4. She has a history of hypertension.  5. She has a history of COPD with ongoing tobacco use.  She has smoked      a pack of cigarettes per day since age 89.  She has no intentions      on quitting.  6. There is a history of congestive heart failure from her  previous      records, although the patient denies ever having congestive heart      failure.  7. She has gastroesophageal reflux disease and gastritis.  8. She has chronic atrial fibrillation and is on Coumadin therapy.  9. She has severe peripheral vascular disease for which she is on      Pletal and aspirin.  She has had a previous stent to the left      superficial femoral artery in 2002 performed by Dr. Alanda Amass.  10.She has chronic back pain.   SURGICAL HISTORY:  Significant for a hysterectomy, cholecystectomy and  appendectomy.  She states that she has felt under sedated during  previous surgeries.   CURRENT MEDICATIONS:  She is now on Coumadin, hydrochlorothiazide,  verapamil and Pletal.  She had previously been on Altace and aspirin.  She no longer takes these medications.   ALLERGIES:  The patient is allergic to multiple medications.  The list  includes ALLEGRA, BIAXIN, CODEINE, GUAIFENESIN, NUBAIN, KEFLEX, TYLOX,  MOTRIN, ENTEX, CIPRO, ATIVAN, and SULFA.  She also reports being  allergic to IBUPROFEN,  TETRACYCLINE, CEPHALOSPORINS, AMOXICILLIN,  ROXICODONE, FLAGYL, and ACIPHEX.   SOCIAL HISTORY:  The patient is married.  She and her husband live in  [place].  They have four children.  As noted, she has been smoking a  pack of cigarettes per day since age 37.  She does not plan to quit.  She does not use alcohol.  She worked in the [employer].  She is  now retired.   FAMILY HISTORY:  Her mother died at age 32 from natural causes.  Her  father died in his 56s in a motor vehicle accident.  She is not aware of  any family history of cerebral aneurysms.   The patient presents today accompanied by her family to discuss a  possible cerebral aneurysm and a possible significant stenosis of the  left vertebral artery.  Dr. Corliss Skains reviewed the results of the MRI  that was performed at [facility].  He felt 90% certain that she did  have an aneurysm.  He was also  concerned that she had significant  cerebrovascular disease as well.  The patient is on Coumadin due to  atrial fibrillation.  She would need to come off the Coumadin for any  intervention or for cerebral angiogram.  Dr. Corliss Skains strongly  recommended performing a cerebral angiogram for further evaluation of  both the aneurysm and the cerebrovascular disease.  After the results of  that study have been obtained, we could meet with the patient again to  discuss his recommendations for treatment.   We will contact Dr. Jenene Slicker office to clear the patient to come off  of Coumadin for the cerebral angiogram.  She also may need cardiac or  pulmonary clearance if intervention with general anesthesia is planned.  The patient would like to await until January for the cerebral  angiogram.  Dr. Corliss Skains also felt it might be beneficial to perform a  more detailed MRI/MRA in the interim, however, the patient is very  claustrophobic and she is somewhat opposed to another MRI.   Cerebral aneurysms as well as cerebrovascular disease was discussed in  detail along with treatment options including risks and benefits.  The  patient and her family were also given some patient education materials  to study at home.  They were asked to review this data and contact us if  she decides that she want to proceed with the cerebral angiogram.   Greater than 60 minutes was spent on this consult.      Delton See, P.A.    ______________________________  Grandville Silos. Corliss Skains, M.D.    DR/MEDQ  D:  06/21/2008  T:  06/22/2008  Job:  [id number]   cc:   Rollene Rotunda, MD, [facility]

## 2010-11-20 NOTE — Assessment & Plan Note (Signed)
[facility]                            CARDIOLOGY OFFICE NOTE   Caroline [name], Caroline [name]                       MRN:          [id number]  DATE:01/21/2007                            DOB:          Mar 31, 1935    PRIMARY CARE PHYSICIAN:  Madelin Rear. Sherwood Gambler, MD.   REASON FOR PRESENTATION:  Evaluate patient with peripheral vascular  disease.   HISTORY OF PRESENT ILLNESS:  Patient returns for follow-up.  Since I  last saw her, she has continued to have increased lower extremity pain.  She has cramping in her foot most nights.  This was more in the left  than the right.  She does have some pain in her feet at rest, though she  is not having any problems with nonhealing ulcers.  She is not  describing any calf discomfort or thigh discomfort.  She does have some  dizziness but has not had any syncope or presyncope.  She is not having  any significant palpitations.  She has not had any chest discomfort,  neck or arm discomfort.  She continues to smoke cigarettes.   PAST MEDICAL HISTORY:  1. Emphysema.  2. Reflux.  3. Cerebrovascular accident with residual weakness in her index,      middle fingers and right hand.  4. Permanent atrial fibrillation with chronic Coumadin.  5. Lower back pain.  6. Hysterectomy.  7. Cholecystectomy.  8. Appendectomy.   ALLERGIES:  CODEINE, NUBAIN, TYLOX, IBUPROFEN, CEPHALEXIN, SULFA,  AMOXICILLIN, ENTEX, GUAIFENESIN, OXYCODONE, ATIVAN.   MEDICATIONS:  1. Hydrochlorothiazide 12.5 mg daily.  2. Altace 5 mg daily.  3. Verapamil 240 mg daily.  4. Coumadin per [facility].   REVIEW OF SYSTEMS:  As stated in the HPI, otherwise negative for other  systems.   PHYSICAL EXAMINATION:  GENERAL APPEARANCE:  Patient is in no distress.  VITAL SIGNS:  Blood pressure 102/74, heart rate 99 and irregular, weight  148 pounds.  HEENT:  Eye lids unremarkable.  Pupils are equal, round and reactive to  light.  Fundi not visualized.  NECK:   No jugular venous distension at 45 degrees.  Carotid upstroke  brisk and symmetric, no bruits, no thyromegaly.  LYMPHATICS:  No adenopathy.  CHEST:  Unremarkable.  LUNGS:  Few expiratory wheezes anteriorly, decreased breath sounds, no  dullness to percussions, no crackles.  BACK:  No costovertebral angle tenderness.  CARDIOVASCULAR:  PMI not displaced or sustained.  Distant heart sounds.  S1 and S2 within normal limits.  No S3, no S4, no murmurs.  ABDOMEN:  Flat, positive bowel sounds, normal in frequency and pitch, no  bruits, no rebound, no guarding, no midline pulsatile mass, no megaly.  SKIN:  No rashes, nodules.  EXTREMITIES:  There was 2+ bilateral radial pulse, 2+ femorals without  bruits, 1+ dorsalis pedis pulse and posterior tibialis bilaterally.  Dependent rubor, no edema.  NEUROLOGIC:  Grossly intact.   EKG with atrial fibrillation, rate 70s to 90s, axis within normal  limits, intervals within normal limits, no acute STT wave change.   ASSESSMENT/PLAN:  1. Peripheral vascular disease:  This is the  patient's biggest      complaint.  She is having some resting discomfort.  She has been      unable to quit smoking which has been the primary therapy that we      have tried to instill.  She was prescribed Pletal by Dr. Samule Ohm      when she saw him.  However, she never got this filled.  I am going      to prescribe this to see if she will have any improvement.  She      will let me know if she tolerates it or if it does not make any      difference.  2. Tobacco:  She understands the need to stop smoking.  3. Follow-up:  I will see her back on March 26, 2007, when I am      also seeing her husband.  She will call me if she has any increased      complaints.     Rollene Rotunda, MD, [facility]  Electronically Signed    JH/MedQ  DD: 01/21/2007  DT: 01/22/2007  Job #: [id number]   cc:   Madelin Rear. Sherwood Gambler, MD

## 2010-11-20 NOTE — Assessment & Plan Note (Signed)
[facility]                            CARDIOLOGY OFFICE NOTE   REILEY, BERTAGNOLLI                       MRN:          [id number]  DATE:03/21/2008                            DOB:          02/26/1935    PRIMARY CARE PHYSICIAN:  Madelin Rear. Fusco, MD.   REASON FOR PRESENTATION:  Evaluate the patient with peripheral vascular  disease and atrial fibrillation.   HISTORY OF PRESENT ILLNESS:  The patient is a pleasant 76 year old white  female who presents for followup of the above.  Since I last saw her,  she has had some cramping in her feet and legs.  She uses a warm  compress.  This is not as severe it was prior to her lower extremity  stenting.  She has not felt any palpitations, presyncope, or syncope.  She has not had any chest discomfort, neck or arm discomfort.  She  continues to smoke cigarettes.   PAST MEDICAL HISTORY:  Emphysema, reflux, cerebrovascular accident with  residual weakness in her index and middle fingers on the right hand,  permanent atrial fibrillation, low back pain, peripheral vascular  disease with reduced ABIs, managed conservatively (she did have stenting  of her left superficial femoral artery in 2002 by Dr. Alanda Amass),  chronic low back pain, hysterectomy, cholecystectomy and appendectomy.   ALLERGIES:  Allergies intolerance is CODEINE, NUBAIN, TYLOX, CAPOTEN,  CEPHALEXIN, SULFA, AMOXICILLIN, ENTEX, FLUPHENAZINE, OXYCODONE, and  ATIVAN.   MEDICATIONS:  1. Hydrochlorothiazide 12.5 mg daily.  2. Calan 240 mg daily.  3. Coumadin.  4. Halobetasol.  5. Pletal 50 mg b.i.d.   REVIEW OF SYSTEMS:  As stated in the HPI and otherwise negative for  other systems.   PHYSICAL EXAMINATION:  GENERAL:  The patient is in no distress.  VITAL SIGNS:  Her blood pressure 144/95, heart rate 103 and irregular,  and weight 152 pounds.  NECK:  No jugular venous distension at 45 degrees, carotid upstroke  brisk and symmetrical; no  bruits; and no thyromegaly.  LYMPHATICS:  No adenopathy.  LUNGS: Clear to auscultation bilaterally.  BACK:  No costovertebral angle tenderness.  CHEST: Unremarkable.  HEART:  PMI not displaced or sustained; S1 and S2 within normal limits;  and no S3, no murmurs.  ABDOMEN:  Flat, positive bowel sounds, normal in frequency and pitch; no  bruits, no rebound, no guarding, no midline pulsatile mass, and no  organomegaly.  SKIN: No rashes, no nodules.  EXTREMITIES: 2+ upper pulses, 1+ posterior tibialis bilaterally.  No  cyanosis, clubbing, deep tendon rubor, no edema.  NEURO: Grossly intact.   EKG, atrial fibrillation, left axis deviation, rate 101, poor anterior R-  wave progression, no acute ST-T wave changes.   ASSESSMENT AND PLAN:  1. Atrial fibrillation.  The patient tolerates her Coumadin.  I am      going to place 24-hour Holter monitor to make sure she has      reasonable rate control.  Otherwise, no therapy is warranted.  2. Tobacco.  She never wants to quit smoking.  We have discussed this      multiple  times.  3. Peripheral vascular disease.  She seems to be better on the Pletal      and not back to her pre-intervention discomfort.  Therefore, we      will continue with his conservative management.  However, if she      has more cramping or symptoms in the future, I might reassess with      repeat ankle-brachial indexes.  She needs aggressive risk      reduction.  4. Dyslipidemia.  I will check a lipid profile when she is fasting.      It might be difficult to convince her that she would be benefit      from a statin but we will have this conservation.  5. Hypertension.  Blood pressure is controlled.  She will continue the      medications as listed.  6. Followup.  I will see her back in 6 months or sooner if needed.     Rollene Rotunda, MD, [facility]  Electronically Signed    JH/MedQ  DD: 03/21/2008  DT: 03/22/2008  Job #: [id number]   cc:   Madelin Rear. Sherwood Gambler, MD

## 2010-11-20 NOTE — Assessment & Plan Note (Signed)
[facility]                            CARDIOLOGY OFFICE NOTE   ASHANTY, COLTRANE                       MRN:          [id number]  DATE:03/24/2007                            DOB:          1935/05/13    PRIMARY:  Dr. Sherwood Gambler   REASON FOR PRESENTATION:  Evaluate patient with peripheral vascular  disease.   HISTORY OF PRESENT ILLNESS:  The patient presents for followup of her  peripheral vascular disease.  She is now 76 years old.  She has been on  the Pletal but had to reduce this to 100 mg b.i.d. since I last saw her.  She does think she has much less leg cramping since then.  Her legs are  not as dark.  She is having much less discomfort.  She denies any new  chest discomfort, neck or arm discomfort.  There are no new  palpitations, presyncope or syncope.   PAST MEDICAL HISTORY:  1. Emphysema.  2. Reflux.  3. Cerebrovascular accident with residual weakness in her index and      middle fingers in the right hand.  4. Permanent atrial fibrillation on chronic Coumadin.  5. Lower back pain.  6. Hysterectomy.  7. Cholecystectomy.  8. Appendectomy.   ALLERGIES AND INTOLERANCES:  1. CODEINE/NUBAIN.  2. TYLOX.  3. IBUPROFEN.  4. CEPHALEXIN.  5. SULFA.  6. AMOXICILLIN.  7. ENTEX.  8. GUAIFENESIN.  9. OXYCODONE.  10.ATIVAN.   MEDICATIONS:  1. Hydrochlorothiazide 12.5 mg daily.  2. Verapamil 240 mg daily.  3. Coumadin per Dr. Sherwood Gambler.  4. Pletal 100 mg b.i.d.   REVIEW OF SYSTEMS:  As stated in the HPI and otherwise negative for  other systems.   PHYSICAL EXAMINATION:  The patient is in no distress.  Blood pressure  138/79, heart rate 90 and irregular, weight 152 pounds.  NECK:  No jugular venous distention at 45 degrees.  Carotid upstroke  brisk and symmetric.  No bruits, no thyromegaly.  LYMPHATICS:  No adenopathy.  LUNGS:  Clear to auscultation bilaterally.  BACK:  No costovertebral angle tenderness.  CHEST:  Unremarkable.  HEART:  PMI  not displaced or sustained.  S1 and S2 within normal limits.  No S3, no murmurs.  ABDOMEN:  Flat.  Positive bowel sounds normal in frequency and pitch.  No bruits, no rebound, no guarding.  No midline pulsatile mass.  No  organomegaly.  SKIN:  No rashes, no nodules.  EXTREMITIES:  Show 2+ upper pulses, 1+ posterior tibialis bilaterally.  No cyanosis, no clubbing, dependent rubor, no edema.  NEUROLOGIC:  Oriented to person, place and time.  Cranial nerves II-XII  grossly intact.  Motor grossly intact.   ASSESSMENT AND PLAN:  1. Peripheral vascular disease.  The patient is doing better on      Pletal.  No further cardiovascular testing is suggested.  She is      going to continue with risk reduction.  2. Tobacco.  She is committed to stopping smoking as her husband has      quit as well.  I hope she sees this through to  the end.  3. Hypertension.  Blood pressure is well controlled and she will      continue the medications as listed.  4. Followup.  I will see her back in about 6 months or sooner if      needed.     Rollene Rotunda, MD, [facility]  Electronically Signed    JH/MedQ  DD: 03/24/2007  DT: 03/25/2007  Job #: [id number]   cc:   Madelin Rear. Sherwood Gambler, MD

## 2010-11-20 NOTE — Assessment & Plan Note (Signed)
[facility]                            CARDIOLOGY OFFICE NOTE   Caroline [name], Caroline [name]                       MRN:          [id number]  DATE:04/19/2009                            DOB:          1935/05/10    PRIMARY DOCTOR:  Madelin Rear. Fusco, MD   REASON FOR PRESENTATION:  Evaluate the patient with peripheral vascular  disease and atrial fibrillation.   HISTORY OF PRESENT ILLNESS:  The patient returns for 65-month followup.  She has done well since I last saw her.  She has not really noticed her  palpitations.  She has had no presyncope or syncope.  She tolerates her  Coumadin.  She will notice that her heart beating somewhat fast if she  forgets to take her verapamil or takes it late.  She denies any chest  pain.  She has had no new shortness of breath, PND, or orthopnea.  Unfortunately, she is still smoking cigarettes.  She continues to take  her Pletal.   PAST MEDICAL HISTORY:  Emphysema, esophageal reflux disease,  cerebrovascular accident (with residual weakness in her index and middle  fingers on the right hand), permanent atrial fibrillation, back pain,  peripheral vascular disease with reduced ABIs (she did have stenting of  her left superficial femoral artery in 2002 by Dr. Alanda Amass), chronic  low back pain, hysterectomy, cholecystectomy, appendectomy.   ALLERGIES:  Intolerance to CODEINE, NUBAIN, TYLOX, CAPOTEN, CEPHALEXIN,  SULFA, AMOXICILLIN, ENTEX, FLUPHENAZINE, OXYCODONE, and ATIVAN.   MEDICATIONS:  1. Hydrochlorothiazide 12.5 mg daily.  2. Verapamil 240 mg daily.  3. Coumadin.  4. Pletal.   REVIEW OF SYSTEMS:  As stated in the HPI, and otherwise negative for all  other systems.   PHYSICAL EXAMINATION:  GENERAL:  The patient is pleasant and in no  distress.  VITAL SIGNS:  Blood pressure 128/70, heart rate 91 and irregular, body  mass index 24, weight 137 pounds.  HEENT:  Eyes are unremarkable.  Pupils are equal, round, and  reactive to  light.  Fundi not visualized.  Oral mucosa unremarkable.  Edentulous.  NECK:  No jugular venous distention at 45 degrees.  Carotid upstroke  brisk and symmetrical.  No bruits, no thyromegaly.  LYMPHATICS:  No  cervical, axillary, or inguinal adenopathy.  LUNGS:  Decreased breath sounds with few expiratory wheezes, no  crackles.  BACK:  No costovertebral angle tenderness.  CHEST:  Unremarkable.  HEART:  PMI not displaced or sustained.  S1 and S2 within normal limits.  No S3, no clicks, no rubs, no murmurs.  ABDOMEN:  Flat, positive bowel sounds.  Normal in frequency and pitch.  No bruits, no rebound, no guarding, no midline pulsatile mass, no  organomegaly.  SKIN:  No rashes, no nodules.  EXTREMITIES:  2+ upper pulse, 1+ dorsalis pedis bilaterally, mild left  lower extremity edema.  NEURO:  Oriented to birth, place, and time.  Cranial nerves II through  XII grossly intact.  Motor grossly intact.   EKG; atrial fibrillation, rate 91, axis within normal limits, intervals  within normal limits, premature ectopic complexes, no acute ST-T wave  changes.   ASSESSMENT AND PLAN:  1. Atrial fibrillation.  The patient is tolerating this rhythm.  She      has had reasonable rate control.  She tolerates Coumadin.  No      further therapy is suggested.  2. Peripheral vascular disease.  We are going to continue to try a      risk reduction.  She will continue with Pletal.  She has not      tolerated Zocor in the past.  I will defer further attempts at      getting her on a statin to her primary physician.  3. Carotid stenosis.  This is 0-39% on the right and 40-59% on the      left, and will be followed up again in April.  4. Tobacco.  She has no desire to quit smoking, though I have      addressed this at every appointment.  5. Followup.  I will see her back in 6 months.     Rollene Rotunda, MD, [facility]  Electronically Signed    JH/MedQ  DD: 04/19/2009  DT: 04/20/2009  Job #:  [id number]   cc:   Madelin Rear. Sherwood Gambler, MD

## 2010-11-23 NOTE — Consult Note (Signed)
NAMECHANNON, BROUGHER                          ACCOUNT NO.:  [id number]   MEDICAL RECORD NO.:  [id number]                   PATIENT TYPE:  OBV   LOCATION:  A306                                 FACILITY:  [facility]   PHYSICIAN:  Kofi A. Gerilyn Pilgrim, M.D.              DATE OF BIRTH:  1935-06-07   DATE OF CONSULTATION:  DATE OF DISCHARGE:                                   CONSULTATION   NEUROLOGIC CONSULTATION:   IMPRESSION:  The tremors are likely a combination effect of panic attack and  betaadrenergic medication.  I believe seizures are less likely.   RECOMMENDATIONS:  1. EGD.  This may be done as an outpatient.  2. Low dose alprazolam to be used on a p.r.n. basis.   HISTORY OF PRESENT ILLNESS:  This is a 76 year old right-handed white female  with a history of high blood pressure and peripheral vascular disease.  She  also has history of TIAs in the past.  She apparently developed the acute  onset of violent tremors and shaking involving all extremities.  There is no  alteration of consciousness, no oral trauma, no bowel or bladder  incontinence.  The spell lasted several minutes. She apparently has had  these repeatedly over many years, however, she has had 3, severe attacks-  like today.  The husband reports that she has minor spells which are usually  relieved by deep breathing and other relaxation techniques.  They tend to  appear under significant psychosocial stressors. She has been under  significant stress recently.  Her husband was hospitalized a few days ago  and undergone considerable tests.  This is the first time that he has been  hospitalized.  She apparently stayed up most of the night, in the hospital,  while he was here.  Her daughter is in the room and does concur with this  history.  She probably was given Ativan, the exact dose is not known, and  developed hallucinations.  It appears that she got 2 mg IV of Ativan per the  order sheet.  She subsequently was given  Benadryl 50 mg to counteract the  visual hallucinations that she was having.  The patient denies any chest  pain, shortness of breath, focal numbness, or weakness with these spells.   PAST MEDICAL HISTORY:  As stated in the history of present illness.  She  also had stenting on the left in 2002, apparently has had multiple stents of  the left lower extremity. She has had cholecystectomy, abdominal  hysterectomy, history of degenerative joint disease, carcinoma history of  atrial fibrillation.  There is also a questionable history of diabetes.  There is a history of asthma/COPD.  She is on inhalers for bronchitis.   ALLERGIES:  Codeine, Nubain, cephalosporins, amoxicillin, Tylox, Motrin,  ibuprofen.   CURRENT MEDICATIONS:  Altace, verapamil, Levaquin, DiaBeta, guaifenesin,  albuterol.   PHYSICAL EXAMINATION:  GENERAL:  Physical examination  shows a mildly  overweight lady in no acute distress.  She is anxious and tremulous  throughout.  VITAL SIGNS:  Blood pressure is 133/64, pulse 74, respirations 20,  temperature 98.1.  LUNGS:  Lungs show prolonged expiratory phase and mild wheezes.  HEART:  Heart sounds normal S1 and S2 but are distant.  NECK:  Supple.  No carotid bruits noted.  NEUROLOGIC:  She is awake.  She is alert and conversational.  Speech,  language, and cognition are generally intact.  Cranial nerves II-XII are  intact and equal in visual fields.  Motor examination shows normal tone,  bulk, and strength. There is no cogwheel rigidity. No bradykinesis.  Coordination, again she has near continuous low amplitude high frequency  tremor.  The tremor is bilaterally.  No rest tremor, no dysmetria, no  intention tremor.  Pronator drift is absent.  Reflexes are brisk.  _________  reflexes are flexor.  Sensory examination is normal to light touch and  temperature.  Gait is wide based and slightly antalgic.   LABORATORY DATA:  CBC is normal, essentially completely normal.   Chemistries  are also normal.  No abnormalities noted including liver enzymes.  CT scan  of the brain shows old lacunar infarcts involving the right thalamus and  left basal ganglia.  No acute processes seen.   Thank you for this consultation.  Please see initial portion for assessment  and plan.                                               Kofi A. Gerilyn Pilgrim, M.D.    KAD/MEDQ  D:  05/15/2002  T:  05/16/2002  Job:  [id number]

## 2010-11-23 NOTE — H&P (Signed)
Caroline [name], Caroline [name]                          ACCOUNT NO.:  [id number]   MEDICAL RECORD NO.:  [id number]                   PATIENT TYPE:  OBV   LOCATION:  A306                                 FACILITY:  [facility]   PHYSICIAN:  Hanley Hays. Dechurch, M.D.           DATE OF BIRTH:  September 11, 1934   DATE OF ADMISSION:  05/14/2002  DATE OF DISCHARGE:                                HISTORY & PHYSICAL   HISTORY OF PRESENT ILLNESS:  The patient is a 76 year old Caucasian female  with a past medical history remarkable for hypertension, diabetes mellitus,  peripheral vascular disease, status post multiple stents of the left lower  extremity, presents with acute onset of uncontrolled tremor.  Apparently,  she has had intermittent problems with tremors that usually involve the  lower extremities for many years, possibly as many as 10, with exacerbations  every three months or so, according to the family.  The patient has had no  formal evaluation.  She has been to the emergency room on several occasions  for the same.  Tonight the emergency room physician felt that this was more  focal, with right-sided myoclonic-tonic activity.  Though the patient was  alert and could move the extremities, these tremors were uncontrolled.  She  received Ativan, and the tremors subsequently resolved.  However, the  patient developed hallucinations and confusion.  She is being admitted for  observation and workup of possible seizure disorder related to ischemic  disease.  The patient notes that these have been going on in a similar  fashion for years but have not been evaluated.  There was nothing different  about tonight's episodes except that they were prolonged.  The patient notes  she is developing upper respiratory symptoms with runny nose, right ear  pain, and cough.  She states she felt somewhat feverish today, though she  had no fever here in the emergency room.  At the time of my evaluation the  patient is  somewhat lethargic, as she has now received Benadryl in addition  to her Ativan.  She is not hallucinating.  She is able to answer questions  and is quite appropriate.   REVIEW OF SYSTEMS:  Is as noted above.  She is also complaining of dry  mouth.  She has had no GI or GU complaints.  She complains of being thirsty.  She denies any falls.  She has chronic left hip pain and back pain secondary  to spinal degenerative disease.   FAMILY MEDICAL HISTORY:  Noncontributory.   SOCIAL HISTORY:  She lives with her husband who, apparently, is having some  health problems as well.  She has four daughters who are alive and well and  supportive.  She smokes 1-1/2 packs per day.  No alcohol abuse.   PAST MEDICAL HISTORY:  1. Pertinent for history of TIA several years ago.  2. Peripheral vascular disease, status post multiple stents of left  leg.  3. Diabetes mellitus, control is unknown.  4. Total abdominal hysterectomy.  5. Status post cholecystectomy.  6. Gravida 4, para 4, abortus 0.  7. History of atrial fibrillation.  8. Degenerative disease of the spine.  9. Hypertension.   HOME MEDICATIONS:  1. Altace 10 mg daily.  2. Aspirin 324 daily.  3. Micronase 5 mg daily.  4. Verapamil SR 240 daily.   ALLERGIES:  She has multiple drug intolerances including CODEINE, NUBAIN,  TYLOX, IBUPROFEN, CEFALEXIN, AMOXICILLIN, MOTRIN, ENTEX, GUAIFENESIN PSE,  and ROXICODONE.   PHYSICAL EXAMINATION:  GENERAL:  Elderly female who appears older than her  stated age.  NEUROLOGIC:  Mental status as noted above.  She has a nonfocal neurologic  exam.  She has intermittent shaking of both lower extremities that she  states she is unable to control.  Nonfocal.  VITAL SIGNS:  Blood pressure 134/70, pulse is in the 80s and regular,  respirations are unlabored.  NECK:  Supple.  I did not hear any bruits.  HEENT:  Oropharynx is dry.  There is some erythema at the posterior pharynx.  No lesions.  LUNGS:   Diminished but clear bilaterally.  She has a coarse, congested upper  airway cough but no rales or rhonchi.  HEART:  Regular.  No murmurs noted.  ABDOMEN:  Obese, soft, nontender.  EXTREMITIES:  Without clubbing, cyanosis, or edema.  The feet are warm, and  pulses are intact bilaterally, 2+ on the right and 1+ on the left.  No edema  is present.  She had some chronic stasis changes.  SKIN:  Without rash, lesion, or breakdown.   ASSESSMENT AND PLAN:  Episodic myoclonic activity raising the question of  seizures though not typical.  CT scan reveals evidence of multiple infarcts.  Certainly this patient with her history should continue her current medical  regimen and question for a statin drug as well.  At some point,  consideration for acetycholine esterase inhibitor therapy would be  reasonable.  At this point, though, she needs evaluation of her carotids, to  consider an echocardiogram.  EEG has been ordered and neurology  consultation.  If the patient is stable, certainly this can be completed as  an outpatient.  Will continue her usual medical regimen and monitor closely,  particularly her glucoses, given the fact that her sugar is normal and she  has been taking Micronase.  The plan of care was discussed with the patient  and the family.  They seem to have reasonable understanding.                                               Hanley Hays Josefine Class, M.D.    FED/MEDQ  D:  05/15/2002  T:  05/15/2002  Job:  [id number]

## 2010-11-23 NOTE — Procedures (Signed)
   Caroline [name], Caroline [name]                          ACCOUNT NO.:  [id number]   MEDICAL RECORD NO.:  [id number]                   PATIENT TYPE:  OBV   LOCATION:  A306                                 FACILITY:  [facility]   PHYSICIAN:  Kofi A. Gerilyn Pilgrim, M.D.              DATE OF BIRTH:  06-16-1935   DATE OF PROCEDURE:  DATE OF DISCHARGE:  05/16/2002                                EEG INTERPRETATION   HISTORY:  This is a 76 year old lady who has had episodic generalized  tremors.   ANALYSIS:  A 16-channel recording conducted for approximately 20 minutes.  There was a posterior rhythm of 13 Hz.  There was significant beta activity  seen throughout the recorder.  Fasting beta activity was seen over the  frontal fields in the mid 20 Hz range.  Awake and drowsy architecture are  seen.  Photic stimulation did not elicit any abnormal responses.  There was  no focal slowing, localized slowing, or epileptiform activity seen.   IMPRESSION:  This was a normal recording of the awake and drowsy states.  There was no focal slowing or epileptiform activity seen.                                               Kofi A. Gerilyn Pilgrim, M.D.    KAD/MEDQ  D:  05/17/2002  T:  05/17/2002  Job:  [id number]

## 2010-11-23 NOTE — H&P (Signed)
Caroline [name], Caroline [name]                          ACCOUNT NO.:  [id number]   MEDICAL RECORD NO.:  [id number]                   PATIENT TYPE:  INP   LOCATION:  A329                                 FACILITY:  [facility]   PHYSICIAN:  Corrie Mckusick, M.D.               DATE OF BIRTH:  Apr 12, 1935   DATE OF ADMISSION:  08/07/2003  DATE OF DISCHARGE:                                HISTORY & PHYSICAL   ADMISSION DIAGNOSIS:  Chronic obstructive pulmonary disease exacerbation.   HISTORY OF PRESENT ILLNESS:  A 76 year old female with a longstanding  history of hypertension, peripheral vascular disease, GERD, and COPD who  presents with COPD exacerbation. She came in with shortness of breath and  cough. This has been the preceding 48 hours. No high fevers. She continues  to smoke. Cardiovascular and GI review of systems  are otherwise negative.   PAST MEDICAL HISTORY:  1. Hypertension.  2. Peripheral vascular disease.  3. GERD.  4. CVA.  5. COPD.   PAST SURGICAL HISTORY:  1. Appendectomy.  2. Hysterectomy.  3. Cholecystectomy.  4. Stents in the left groin.   MEDICATIONS ON ADMISSION:  1. Plavix 75 mg daily.  2. Verapamil 240 daily.  3. Baby aspirin 81 mg daily.  4. Atrovent q.i.d.  5. Nexium 40 mg daily.  6. Altace 5 mg daily.  7. Xanax 0.25 mg p.r.n.   ALLERGIES:  ALLEGRA, BIAXIN, CODEINE, GUAIFENESIN, NUBAIN, KEFLEX, TYLOX,  MOTRIN, ENTEX, CIPROFLOXACIN OTIC, ATIVAN, and SULFA.   SOCIAL HISTORY:  Continues to smoke a pack a day since age 21. No alcohol or  drugs.   FAMILY HISTORY:  Otherwise nonsignificant family history.   PHYSICAL EXAMINATION:  VITAL SIGNS: Temperature 98.4, blood pressure 132/78,  respiratory rate 18.  GENERAL: When I saw the patient she was pleasant, talkative, joking, and in  no acute distress.  HEENT: Nasopharynx clear with moist mucous membranes.  NECK: No JVD.  CHEST: Diffuse end-expiratory wheezes as well as rhonchi and rales heard in  the upper  airways. No areas of consolidation.  CARDIOVASCULAR: Regular rate and rhythm. Soft. S1 and S2. No murmurs.  ABDOMEN: Soft and nontender.  EXTREMITIES: No clubbing, cyanosis, erythema, or edema.   LABORATORY:  As per flow sheet.   ASSESSMENT:  A 76 year old with the above-stated medical problems with  chronic obstructive pulmonary disease exacerbation.   PLAN:  1. Admit for nebulizer treatment q.i.d.  2. Rocephin 1 gm IV q.24h. and Zithromax. The patient can take Zithromax.  3. Prednisone 50 mg p.o. daily.  4. Continue current medications.  5. Strongly encouraged patient to discontinue smoking.     ___________________________________________                                         Corrie Mckusick, M.D.   JCG/MEDQ  D:  08/08/2003  T:  08/08/2003  Job:  [id number]

## 2010-11-23 NOTE — Discharge Summary (Signed)
Caroline, [name]                          ACCOUNT NO.:  [id number]   MEDICAL RECORD NO.:  [id number]                   PATIENT TYPE:  INP   LOCATION:  A329                                 FACILITY:  [facility]   PHYSICIAN:  Corrie Mckusick, M.D.               DATE OF BIRTH:  July 21, 1934   DATE OF ADMISSION:  08/07/2003  DATE OF DISCHARGE:                                 DISCHARGE SUMMARY   DISCHARGE DIAGNOSIS:  Chronic obstructive pulmonary disease exacerbation,  bronchitis.   HISTORY OF PRESENTING ILLNESS AND PAST MEDICAL HISTORY:  Please see  admission H&P.   HOSPITAL COURSE:  A 76 year old female with longstanding history of  hypertension and peripheral vascular disease, GERD, and COPD who presented  with exacerbation.  She came in with shortness of breath the preceding 48  hours prior to admission.  She continues to smoke.  Cardiovascular review of  systems was negative.  She was admitted for IV Rocephin as well as  Zithromax.  She was admitted for aggressive nebulizer treatments.  She does  not have a nebulizer at home.  She was also placed on p.o. prednisone after  receiving Solu-Medrol in the emergency department.  Blood gas as stated in  HP was quite stable for the patient.  She has done quite well since  admission which much less shortness of breath.   Forty-eight hours after admission she had greatly improved aeration of the  lungs, was much less short of breath.  She has remained afebrile.   DISCHARGE PHYSICAL EXAMINATION:  VITAL SIGNS:  She was febrile, vital signs  stable.  GENERAL:  Pleasant female, joking, in no acute distress.  HEENT:  Nasal and oropharynx are clear.  CHEST:  Much better breath sounds are heard throughout.  Much less,  wheezing, much less rhonchi.  CARDIOVASCULAR:  Regular rate and rhythm, soft S1, S2.  No murmurs.  ABDOMEN:  Soft, nontender.  EXTREMITIES:  No cyanosis, clubbing, erythema, no edema.   DISCHARGE MEDICATIONS:  Same as admission  with the addition of prednisone  taper 40 mg p.o. daily for 3 days, 30 mg for 3 days, 20 mg for 3 days, 10 mg  for 3 days.  She will be sent home on Zithromax.  She has quite a bit of allergies but  can take azithromycin.  She will also go home on nebulizer treatments and  have a home nebulizer from this point on.  She is going to use Xopenex  nebulizers 1.25 mg t.i.d. as well as Atrovent nebulizers t.i.d.   I strongly encouraged her to discontinue smoking which she is going to work  on.  She is going to follow up in 1-2 weeks or sooner if need be.   DISCHARGE CONDITION:  Improved and stable.    ___________________________________________  Corrie Mckusick, M.D.   [place] Melter  D:  03-17-2004  T:  03-17-2004  Job:  [id number]

## 2010-11-23 NOTE — Discharge Summary (Signed)
[place]  Patient:    Caroline [name], Caroline [name]                         MRN: [id number] Adm. Date:  [id number] Disc. Date: [id number] Attending:  Ruta Hinds Dictator:   Halford Decamp Delanna Ahmadi, R.N., N.P.                           Discharge Summary  HISTORY OF PRESENT ILLNESS:  The patient is a 76 year old white married female patient of Dr. Mendel Ryder who was seen in the office with complaints of lower extremity claudication.  HOSPITAL COURSE:  She went on to have Dopplers done.  Her ABI on the left was 0.60, on the right it was 1.10.  She was scheduled for outpatient PV angiogram secondary to her ABIs and claudication.  This was performed on August 19, 2000, by Dr. Susa Griffins.  She was found to have a segmental subtotal, total distal left SFA, no significant iliac disease, essentially normal renals, and no disease on her right.  She went on to have a PTA and stenting of her left SFA at two separate sites.  It appears to have been a somewhat difficult procedure.  Please see Dr. Mendel Ryder Procedure Note for further details.  On the morning of August 20, 2000, she was seen again by Dr. Susa Griffins and considered stable to be discharged home.  She had right groin bruising (minimal), no hematoma.  She had 1+ to 2+ pedal pulses on her left.  Blood pressure was 140-150.  Her medications were adjusted.  DISCHARGE MEDICATIONS: 1. Wellbutrin SR 150 mg one q.d. 2. Altace 5 mg b.i.d. or two 5 mg once q.d. 3. Plavix 75 mg one q.d. 4. Toprol XL 12.5 mg one q.d. 5. Verapamil SR 240 mg one q.d. 6. Zocor 20 mg h.s. q.p.m.  DISCHARGE INSTRUCTIONS:  She is to do no strenuous activity, no driving or lifting x 4 days.  She will quit smoking.  DISCHARGE FOLLOWUP:   She will have follow-up Dopplers and then a follow-up appointment in the office.  Our office will call her with these appointments.  LABORATORY AND X-RAY DATA:  As an  outpatient her glucose was 113, BUN was 12, creatinine was 1.1, sodium was 141, potassium was 4.6.  Hemoglobin was 16, hematocrit was 48.1, platelets were 311, PTT and PT were within normal limits.  No chest x-ray is in the chart at the time of this dictation.  DISCHARGE DIAGNOSES: 1. Arteriosclerotic peripheral vascular disease with left subtotal superficial    femoral artery disease treated with percutaneous transluminal angioplasty    and stenting at two sites. 2. Tobacco use. 3. Previous transient ischemic attack symptoms. 4. Bilateral carotid disease, apparently mild at this time. 5. Hyperlipidemia. 6. History of spinal stenosis. 7. Chronic obstructive pulmonary disease. DD:  08/20/00 TD:  08/21/00 Job: [id number]

## 2010-11-23 NOTE — Op Note (Signed)
[facility]  Patient:    Caroline [name], Caroline [name]                         MRN: [id number] Proc. Date: 08/19/00 Adm. Date:  [id number] Disc. Date: [id number] Attending:  Ruta Hinds CC:         Runell Gess, M.D.  Loraine Leriche ____________, M.D., [facility], [place], [place]  CP Lab  6th Floor Peripheral Lab [facility], [place]   Operative Report  PROCEDURES:  Retrograde abdominal aortic catheterization, abdominal aortic angiogram midstream PA projection, bilateral iliac angiography PA and oblique projections, bilateral lower extremity runoff using bolus chase technique with digital subtraction angiography imaging, intra-arterial nitroglycerin administration, intra-arterial papaverine administration, percutaneous transluminal angioplasty distal, mid, and proximal superficial femoral artery, stenting distal and mid left superficial femoral artery, completion angiogram.  INDICATIONS FOR PROCEDURE:  Caroline [name] is a 76 year old married mother of four with a history of left lower extremity claudication that is chronic, worse over the last 4-6 months, limiting and less than 50 yards.  Fontain 2B classification.  She has a history of atrial fibrillation with hospitalization at [facility] March 08, 2000, treated medically with calcium blockers.  At that time, a 2-D echocardiogram showed normal LV function with a normal left atrial size.  She had a follow-up negative Cardiolite for ischemia April 24, 2000, with an EF of 70%.  There is a history of hypertension and continued cigarette abuse.  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  May 08, 2000, she had a RIND characterized by left upper extremity numbness and weakness with a negative CT at [facility] for acute event.  She was seen in follow-up by Dr. Shireen Quan and placed on Plavix in addition to aspirin and then was seen by me in consultation.  Outpatient carotid Dopplers showed no significant stenosis.  Lower  extremity Dopplers demonstrated a normal RABI of 1.10 and an abnormal LABI of 0.60 with monophasic left SFA and distal wave forms.  She was admitted for diagnostic angiography and possible intervention for symptom limiting left lower extremity claudication.  The patient was brought to the sixth floor peripheral lab in the postabsorptive stated after premedication with 5 mg Valium p.o.  Xylocaine 1% was used for local anesthesia.  The RFA was entered with an anterior punch using an 18 thin-walled needle and a 6 French short Cordis side-arm sheath was inserted without difficulty.  A 5 French Cordis pigtail catheter was advanced above the level of the renal arteries over a 0.035 inch Wholey wire which was used for exchange catheters.  Mid stream abdominal angiography was done at 30 cc, 20 cc per second with DSA.  A second injection was done above the iliac bifurcation at 88 cc, 8 cc per second with bilateral lower extremity runoff using step-table and DSA imaging with visualization to the seat bilateral. Arterial pressures were monitored throughout the procedure and ranged from 160-200 mmHg.  The patient tolerated the diagnostic procedure well.  Abdominal angiogram:  Revealed the patent proximal celiac and SMA axis.  The left renal artery was single and had a 30% narrowing at its proximal portion with good flow.  The right renal artery was single and normal.  There was mild infrarenal atherosclerotic disease with no significant stenosis or aneurysm.  The right common iliac had 20-30% calcified narrowing and +2 calcification. The left common iliac was near normal with mild calcification.  The hypogastric arteries were intact bilaterally with no  significant stenosis.  The external iliacs were relatively small in caliber, but there was no significant stenosis bilaterally.  The right profundus were intact bilaterally.  The right SFA had mild disease throughout with less than 20% narrowing  and good flow.  The right popliteal was normal.  There was three-vessel runoff to the right foot with no significant tibial stenosis.  The LSFA had tandem 60 and 70% proximal narrowings with segmental narrowing of about 50% throughout the proximal third of the vessel.  There was high-grade stenosis beginning in the mid left SFA that was segmental approximately 10 cm in length with total occlusion in the mid portion after a 90% stenosis and profunda and SFA collaterals reconstituting the distal LSFA just below Hunters canal.  The left popliteal was intact slightly underfilled but no significant stenosis.  The left tibial vessel showed a normal trifurcation with mild disease and three-vessel runoff to the left foot.  The anterior tibials were dominant bilaterally.  There was a left ______ with a attempted recanalization of the LSFA.  The 6 French sheath was used with a 5 [place] IMA catheter to intubate the LCIA after right iliac injections were done through the side-arm sheath in the PA and oblique projections.  The [facility] wire was used to traverse the left iliac system contralaterally.  The 6 French sheath was exchanged for a 7 [place] crossover Balkan sheath positioned in the LEIA and the dilator removed.  The [facility] wire was advanced under fluoroscopic control to the level of total occlusion but would not cross easily.  Initially a right coronary catheter was used to direct the [facility] wire into the area of the true lumen.  The wire was then exchanged for a 0.018 inch Cordis SV wire.  This wire was used to recannulize the total occlusion but did not cross it completely.  The catheter was exchanged for a Terumo straight 5 [place] guiding catheter.  The guidewire was exchanged for a 0.035 inch hydrophilic guidewire which was used to cross the total stenosis.  It was free in the distal LSFA and positioned in the  distal popliteal artery.  The end-hole catheter was removed.  The  total occlusion was predilated with a 3 mm x 4 cm Cordis Savvy balloon throughout the segmental area of stenosis and occlusion at 8-30, 8-30, 8-30, and 12-30. An adequate lumen was obtained for further intervention.  The end-hole catheter was then advanced over this in exchange for the balloon, and the wire was replaced with a 0.035 inch Wholey wire which was free in the distal popliteal vessel confirmed by fluoroscopy.  A Cordis Powerflex 4 mm x 8 mm balloon was then used to dilated the mid and distal LSFA stenosis at 7-30 and 9-30.  The balloon was removed.  A 7 mm x 120 mm Cordis nitinol self-expanding S.M.A.R.T. stent was prepared.  This was used to cross into the LSFA.  The stent, however, became entrapped in the proximal and mid LSFA, probably related to more significant stenosis in this area than was realized. Intra-arterial nitroglycerin, a total of 900 mcg was administered.  The stent was then able to be passed into the proximal portion of the distal SFA stenosis, covering the mid LSFA and the proximal portion of the segmental lesion.  It could not be advanced any further despite the use of 10 mg of intra-arterial papaverine administration.  For this reason, the stent was deployed in this area.  It was unsheathed under fluoroscopic control successfully, and the  delivery system was removed.  The stent was then postdilated with the 4 mm x 8 cm Cordis Powerflex balloon.  The proximal LSFA on angiographic injection was found to have significant stenosis and disruption after passage of the stent sheath.  The proximal LSFA was then dilated with the 4 mm x 8 cm Cordis Powerflex balloon with a high-grade stenosis relieved with good balloon expansion from the proximal LSFA down to the junction of the mid third.  The balloon was upgraded to a 5 mm x 10 cm Cordis Powerflex balloon, and the stent was dilated at 6 atmospheres for 20 seconds.  A second self-expanding nitinol Cordis S.M.A.R.T.  stent 7 mm x 120 mm was then prepared and delivered over the [facility] wire through the previous stent, covering the SFA lesion, landing in the proximal popliteal area beyond Hunters canal.  It was deployed under fluoroscopic control and postdilated with a 5 mm x 10 cm Cordis Powerflex balloon at 9-30 and 9-25.  Good flow was established.  Angiography of the proximal left SFA revealed residual narrowing, so this area was redilated with a 5 mm x 10 cm Cordis Powerflex balloon at 6 atmospheres for 60 seconds.  The balloon was pulled back, and final injections were obtained by hand.  The proximal left SFA demonstrated a good angiographic result with less than 20% residual narrowing.  There was a focal linear dissection at the junction of the proximal third of the LSFA, but this was not flow limiting and was angiographically smooth, so it was not stented.  The stents were widely patent with less than 10% residual stenosis down to the distal left SFA.  There was good flow through the popliteal and good flow into the tibial trifurcation with good runoff.  The dilatation system was removed.  The crossover sheath was removed with the dilator in place under fluoroscopic control and replaced with a 7 French short Cordis side-arm sheath in the RFA.  During the procedure, the patient received intermittent heparin totalling 4500 units monitoring ACTs.  She was sedated with Versed a total of 3 mg IV and fentanyl a total of 50 mcg IV.  The patient has had successful tandem stenting of her mid and distal left SFA, as outlined above, and successful PTA of her proximal left SFA with good flow and good angiographic result and runoff.  Because of the extent of her stented area and disease in the LSFA, she is at high risk of restenosis of this area.  Should this happen, she might be a candidate for repeat dilatation, depending on the angiographic results or may require subsequent left FPBG.  CATHETERIZATION  DIAGNOSES: 1. Atherosclerotic peripheral vascular disease - symptom limiting left lower    extremity claudication Fontain 2B. 2. Successful tandem segmental long stenting left mid and distal superficial    femoral artery; successful proximal left superficial femoral artery    percutaneous transluminal angioplasty. 3. History of atrial fibrillation March 08, 2000, treated medically. 4. Right brain reversible intermittent or ischemic neurologic deficit without    residual.  On chronic aspirin and Plavix without recurrence, possibly    embolic with negative carotid Dopplers May 14, 2000. DD:  08/20/00 TD:  08/20/00 Job: [id number]

## 2010-12-11 ENCOUNTER — Encounter: Payer: Self-pay | Admitting: Cardiology

## 2010-12-18 ENCOUNTER — Other Ambulatory Visit: Payer: Self-pay | Admitting: Cardiology

## 2010-12-18 DIAGNOSIS — I6529 Occlusion and stenosis of unspecified carotid artery: Secondary | ICD-10-CM

## 2010-12-19 ENCOUNTER — Encounter (INDEPENDENT_AMBULATORY_CARE_PROVIDER_SITE_OTHER): Payer: Medicare Other | Admitting: *Deleted

## 2010-12-19 ENCOUNTER — Other Ambulatory Visit: Payer: Self-pay | Admitting: *Deleted

## 2010-12-19 DIAGNOSIS — I6529 Occlusion and stenosis of unspecified carotid artery: Secondary | ICD-10-CM

## 2010-12-21 ENCOUNTER — Encounter: Payer: Self-pay | Admitting: Cardiology

## 2010-12-25 ENCOUNTER — Telehealth: Payer: Self-pay | Admitting: Cardiology

## 2010-12-25 NOTE — Telephone Encounter (Signed)
Called pt with results of carotid doppler

## 2010-12-25 NOTE — Progress Notes (Signed)
Called to review results with pt however family member states she is asleep and he will have her call back.

## 2010-12-25 NOTE — Telephone Encounter (Signed)
Per pt calling back, someone called her home.

## 2011-01-02 ENCOUNTER — Ambulatory Visit (INDEPENDENT_AMBULATORY_CARE_PROVIDER_SITE_OTHER): Payer: Medicare Other | Admitting: Cardiology

## 2011-01-02 ENCOUNTER — Encounter: Payer: Self-pay | Admitting: Cardiology

## 2011-01-02 DIAGNOSIS — F17200 Nicotine dependence, unspecified, uncomplicated: Secondary | ICD-10-CM

## 2011-01-02 DIAGNOSIS — I6529 Occlusion and stenosis of unspecified carotid artery: Secondary | ICD-10-CM

## 2011-01-02 DIAGNOSIS — I4891 Unspecified atrial fibrillation: Secondary | ICD-10-CM

## 2011-01-02 DIAGNOSIS — R5383 Other fatigue: Secondary | ICD-10-CM

## 2011-01-02 NOTE — Patient Instructions (Signed)
Please continue your current medications Please have lab work drawn at your primary care MD - CBC/TSH Please follow up with Dr Antoine Poche in 6 months in [place]

## 2011-01-02 NOTE — Progress Notes (Signed)
HPI The patient presents for followup. Since I saw her previously she has had no new cardiovascular complaints. Lower extremity swelling is less problematic. She is denying any chest discomfort, neck or arm discomfort. She has no weight gain or edema. She has had no PND or orthopnea. She only notices her heart racing she forgets her medications and she's not had any presyncope or syncope. Unfortunately she continues to smoke cigarettes. She does complain of fatigue which is episodic.  Allergies  Allergen Reactions  . Advair Hfa   . Albuterol   . Amoxicillin   . Cephalexin   . Codeine   . Etanercept   . Ibuprofen   . Lorazepam   . Nalbuphine   . Oxycodone Hcl   . Oxycodone-Acetaminophen     Current Outpatient Prescriptions  Medication Sig Dispense Refill  . cilostazol (PLETAL) 100 MG tablet Take 100 mg by mouth daily.        . dabigatran (PRADAXA) 150 MG CAPS Take 150 mg by mouth 2 (two) times daily. Do not expose to light       . hydrochlorothiazide (HYDRODIURIL) 12.5 MG tablet Take 12.5 mg by mouth daily.        . verapamil (CALAN-SR) 240 MG CR tablet Take 240 mg by mouth daily.          Past Medical History  Diagnosis Date  . Hypertension   . GERD (gastroesophageal reflux disease)   . Cerebrovascular disease   . Peripheral vascular disease     40-59% left carotid stenosis, 0-39% right  . PAD (peripheral artery disease)     lower extremity PAD with bilateral leg stenting  . TIA (transient ischemic attack)   . COPD (chronic obstructive pulmonary disease)     Past Surgical History  Procedure Date  . Appendectomy   . Partial hysterectomy   . Gallbladder surgery     ROS:  As stated in the HPI and negative for all other systems.  PHYSICAL EXAM BP 142/78  Pulse 94  Resp 18  Ht 5\' 5"  (1.651 m)  Wt 133 lb (60.328 kg)  BMI 22.13 kg/m2 GENERAL:  Well appearing HEENT:  Pupils equal round and reactive, fundi not visualized, oral mucosa unremarkable NECK:  No jugular  venous distention, waveform within normal limits, carotid upstroke brisk and symmetric, no bruits, no thyromegaly LYMPHATICS:  No cervical, inguinal adenopathy LUNGS:  Clear to auscultation bilaterally, decreased BS BACK:  No CVA tenderness CHEST:  Unremarkable HEART:  PMI not displaced or sustained,S1 and S2 within normal limits, no S3, no S4, no clicks, no rubs, no murmurs ABD:  Flat, positive bowel sounds normal in frequency in pitch, no bruits, no rebound, no guarding, no midline pulsatile mass, no hepatomegaly, no splenomegaly EXT:  2 plus pulses upper and diminished bilateral PT/DP, no edema, no cyanosis no clubbing SKIN:  No rashes no nodules NEURO:  Cranial nerves II through XII grossly intact, motor grossly intact throughout PSYCH:  Cognitively intact, oriented to person place and time  EKG:  Atrial fibrillation, rate controlled, no acute ST T wave changes  ASSESSMENT AND PLAN

## 2011-01-02 NOTE — Assessment & Plan Note (Signed)
I will check a TSH.

## 2011-01-02 NOTE — Assessment & Plan Note (Signed)
She is up-to-date with followup of this and had recent nonobstructive disease documented. We will follow this in one year.

## 2011-01-02 NOTE — Assessment & Plan Note (Signed)
She tolerates rate control and anticoagulation.  I will check a CBC.  She will continue current meds.

## 2011-01-02 NOTE — Assessment & Plan Note (Signed)
She is not going to quit smoking.  I counseled her again.

## 2011-01-10 ENCOUNTER — Other Ambulatory Visit: Payer: Self-pay | Admitting: Cardiology

## 2011-01-10 LAB — CBC
HCT: 49.1 % — ABNORMAL HIGH (ref 36.0–46.0)
MCV: 88.6 fL (ref 78.0–100.0)
RBC: 5.54 MIL/uL — ABNORMAL HIGH (ref 3.87–5.11)
WBC: 6.6 10*3/uL (ref 4.0–10.5)

## 2011-01-23 ENCOUNTER — Other Ambulatory Visit: Payer: Self-pay | Admitting: *Deleted

## 2011-01-23 ENCOUNTER — Other Ambulatory Visit: Payer: Self-pay | Admitting: Cardiology

## 2011-01-23 MED ORDER — DABIGATRAN ETEXILATE MESYLATE 150 MG PO CAPS: 150.0000 mg | ORAL_CAPSULE | Freq: Two times a day (BID) | ORAL | Status: DC

## 2011-04-09 LAB — URINALYSIS, ROUTINE W REFLEX MICROSCOPIC
Bilirubin Urine: NEGATIVE
Glucose, UA: NEGATIVE
Ketones, ur: NEGATIVE
Leukocytes, UA: NEGATIVE
pH: 6.5

## 2011-04-09 LAB — DIFFERENTIAL
Basophils Absolute: 0.1
Basophils Relative: 1
Eosinophils Absolute: 0.2
Eosinophils Relative: 2
Monocytes Absolute: 0.6

## 2011-04-09 LAB — CBC
Hemoglobin: 17.2 — ABNORMAL HIGH
MCHC: 33.6
MCV: 90.3
RBC: 5.66 — ABNORMAL HIGH

## 2011-04-09 LAB — COMPREHENSIVE METABOLIC PANEL
AST: 25
Albumin: 3.7
Alkaline Phosphatase: 99
BUN: 4 — ABNORMAL LOW
Chloride: 99
GFR calc Af Amer: >60
Potassium: 3.8
Total Bilirubin: 1.1

## 2011-04-09 LAB — URINE CULTURE: Culture: NO GROWTH

## 2011-06-12 ENCOUNTER — Encounter: Payer: Self-pay | Admitting: Cardiology

## 2011-06-19 ENCOUNTER — Other Ambulatory Visit: Payer: Self-pay | Admitting: Cardiology

## 2011-06-19 ENCOUNTER — Encounter: Payer: Self-pay | Admitting: Cardiology

## 2011-06-19 ENCOUNTER — Ambulatory Visit (INDEPENDENT_AMBULATORY_CARE_PROVIDER_SITE_OTHER): Payer: Medicare Other | Admitting: Cardiology

## 2011-06-19 DIAGNOSIS — I4891 Unspecified atrial fibrillation: Secondary | ICD-10-CM

## 2011-06-19 DIAGNOSIS — F17200 Nicotine dependence, unspecified, uncomplicated: Secondary | ICD-10-CM

## 2011-06-19 DIAGNOSIS — R109 Unspecified abdominal pain: Secondary | ICD-10-CM

## 2011-06-19 DIAGNOSIS — I739 Peripheral vascular disease, unspecified: Secondary | ICD-10-CM

## 2011-06-19 DIAGNOSIS — I6529 Occlusion and stenosis of unspecified carotid artery: Secondary | ICD-10-CM

## 2011-06-19 MED ORDER — ESOMEPRAZOLE MAGNESIUM 40 MG PO CPDR: 40.0000 mg | DELAYED_RELEASE_CAPSULE | Freq: Every day | ORAL | Status: DC

## 2011-06-19 NOTE — Patient Instructions (Signed)
Please start Nexium 40 mg a day.  Continue all other medications as listed  Please have CBC drawn at your primary care MD office.  Follow up in 6 months with Dr Antoine Poche.  You will receive a letter in the mail 2 months before you are due.  Please call us when you receive this letter to schedule your follow up appointment.

## 2011-06-19 NOTE — Progress Notes (Signed)
HPI The patient presents for followup. Since I saw her previously she has had no new cardiovascular complaints. She has continued fatigue but this is unchanged.  She is not is bothered by lower extremities swelling as she has been in the past. She does her daily activities. She continues to smoke cigarettes. The patient denies any new symptoms such as chest discomfort, neck or arm discomfort. There has been no new shortness of breath, PND or orthopnea. There have been no reported palpitations, presyncope or syncope.   Allergies  Allergen Reactions  . Advair Hfa   . Albuterol   . Amoxicillin   . Cephalexin   . Codeine   . Etanercept   . Ibuprofen   . Lorazepam   . Nalbuphine   . Oxycodone Hcl   . Oxycodone-Acetaminophen     Current Outpatient Prescriptions  Medication Sig Dispense Refill  . cilostazol (PLETAL) 100 MG tablet Take 100 mg by mouth daily.        . dabigatran (PRADAXA) 150 MG CAPS Take 1 capsule (150 mg total) by mouth every 12 (twelve) hours.  60 capsule  6  . hydrochlorothiazide (HYDRODIURIL) 12.5 MG tablet Take 12.5 mg by mouth daily.        . verapamil (CALAN-SR) 240 MG CR tablet Take 240 mg by mouth daily.          Past Medical History  Diagnosis Date  . Hypertension   . GERD (gastroesophageal reflux disease)   . Cerebrovascular disease   . Peripheral vascular disease     40-59% left carotid stenosis, 0-39% right  . PAD (peripheral artery disease)     lower extremity PAD with bilateral leg stenting  . TIA (transient ischemic attack)   . COPD (chronic obstructive pulmonary disease)     Past Surgical History  Procedure Date  . Appendectomy   . Partial hysterectomy   . Gallbladder surgery     ROS:  Abdominal pain.  Otherwise as stated in the HPI and negative for all other systems.  PHYSICAL EXAM BP 112/72  Pulse 97  Resp 18  Ht 5\' 4"  (1.626 m)  Wt 130 lb (58.968 kg)  BMI 22.31 kg/m2 GENERAL:  Well appearing HEENT:  Pupils equal round and reactive,  fundi not visualized, oral mucosa unremarkable, dentures NECK:  No jugular venous distention, waveform within normal limits, carotid upstroke brisk and symmetric, no bruits, no thyromegaly LYMPHATICS:  No cervical, inguinal adenopathy LUNGS:  Clear to auscultation bilaterally, decreased BS BACK:  No CVA tenderness CHEST:  Unremarkable HEART:  PMI not displaced or sustained,S1 and S2 within normal limits, no S3, no clicks, no rubs, no murmurs, irregular ABD:  Flat, positive bowel sounds normal in frequency in pitch, no bruits, no rebound, no guarding, no midline pulsatile mass, no hepatomegaly, no splenomegaly EXT:  2 plus pulses upper and diminished bilateral PT/DP, no edema, no cyanosis no clubbing SKIN:  No rashes no nodules NEURO:  Cranial nerves II through XII grossly intact, motor grossly intact throughout PSYCH:  Cognitively intact, oriented to person place and time  EKG:  Atrial fibrillation, rate controlled, no acute ST T wave changes rate 99.  06/19/2011   ASSESSMENT AND PLAN

## 2011-06-19 NOTE — Assessment & Plan Note (Addendum)
The patient  tolerates this rhythm and rate control and anticoagulation. We will continue with the meds as listed. I will be checking a CBC which I think should be done twice yearly

## 2011-06-19 NOTE — Assessment & Plan Note (Signed)
She refuses to quit smoking.  We have talked and talked about this.

## 2011-06-19 NOTE — Assessment & Plan Note (Signed)
She is bothered by some abdominal bloating and discomfort. Nexium has helped in the past. I will prescribe this

## 2011-06-19 NOTE — Assessment & Plan Note (Signed)
We are managing this medically.  I will continue to attempt risk reduction.

## 2011-06-19 NOTE — Assessment & Plan Note (Signed)
This is mild to moderate and will be followed up with carotid Doppl.er again in June

## 2011-06-20 LAB — CBC
HCT: 48.2 % — ABNORMAL HIGH (ref 36.0–46.0)
MCH: 30.1 pg (ref 26.0–34.0)
MCHC: 34.2 g/dL (ref 30.0–36.0)
RDW: 14.6 % (ref 11.5–15.5)

## 2011-06-24 ENCOUNTER — Encounter: Payer: Medicare Other | Admitting: Cardiology

## 2011-07-09 DIAGNOSIS — R0602 Shortness of breath: Secondary | ICD-10-CM | POA: Diagnosis not present

## 2011-07-10 ENCOUNTER — Emergency Department (HOSPITAL_COMMUNITY): Payer: Medicare Other

## 2011-07-10 ENCOUNTER — Inpatient Hospital Stay (HOSPITAL_COMMUNITY): Payer: Medicare Other

## 2011-07-10 ENCOUNTER — Inpatient Hospital Stay (HOSPITAL_COMMUNITY)
Admission: AD | Admit: 2011-07-10 | Discharge: 2011-07-15 | DRG: 192 | Disposition: A | Discharge status: Home / Self Care | Payer: Medicare Other | Attending: Internal Medicine | Admitting: Internal Medicine

## 2011-07-10 ENCOUNTER — Encounter (HOSPITAL_COMMUNITY): Payer: Self-pay

## 2011-07-10 ENCOUNTER — Other Ambulatory Visit: Payer: Self-pay

## 2011-07-10 DIAGNOSIS — I672 Cerebral atherosclerosis: Secondary | ICD-10-CM | POA: Diagnosis present

## 2011-07-10 DIAGNOSIS — I6529 Occlusion and stenosis of unspecified carotid artery: Secondary | ICD-10-CM | POA: Diagnosis present

## 2011-07-10 DIAGNOSIS — IMO0002 Reserved for concepts with insufficient information to code with codable children: Secondary | ICD-10-CM

## 2011-07-10 DIAGNOSIS — J449 Chronic obstructive pulmonary disease, unspecified: Secondary | ICD-10-CM | POA: Diagnosis not present

## 2011-07-10 DIAGNOSIS — Z888 Allergy status to other drugs, medicaments and biological substances status: Secondary | ICD-10-CM | POA: Diagnosis not present

## 2011-07-10 DIAGNOSIS — I10 Essential (primary) hypertension: Secondary | ICD-10-CM | POA: Diagnosis present

## 2011-07-10 DIAGNOSIS — E875 Hyperkalemia: Secondary | ICD-10-CM | POA: Diagnosis not present

## 2011-07-10 DIAGNOSIS — Z9981 Dependence on supplemental oxygen: Secondary | ICD-10-CM

## 2011-07-10 DIAGNOSIS — H9209 Otalgia, unspecified ear: Secondary | ICD-10-CM | POA: Diagnosis present

## 2011-07-10 DIAGNOSIS — K219 Gastro-esophageal reflux disease without esophagitis: Secondary | ICD-10-CM | POA: Diagnosis present

## 2011-07-10 DIAGNOSIS — R109 Unspecified abdominal pain: Secondary | ICD-10-CM | POA: Diagnosis not present

## 2011-07-10 DIAGNOSIS — I4891 Unspecified atrial fibrillation: Secondary | ICD-10-CM | POA: Diagnosis not present

## 2011-07-10 DIAGNOSIS — E876 Hypokalemia: Secondary | ICD-10-CM | POA: Diagnosis present

## 2011-07-10 DIAGNOSIS — R0602 Shortness of breath: Secondary | ICD-10-CM | POA: Diagnosis not present

## 2011-07-10 DIAGNOSIS — Z79899 Other long term (current) drug therapy: Secondary | ICD-10-CM | POA: Diagnosis not present

## 2011-07-10 DIAGNOSIS — Z8673 Personal history of transient ischemic attack (TIA), and cerebral infarction without residual deficits: Secondary | ICD-10-CM

## 2011-07-10 DIAGNOSIS — J441 Chronic obstructive pulmonary disease with (acute) exacerbation: Secondary | ICD-10-CM | POA: Diagnosis not present

## 2011-07-10 DIAGNOSIS — Z4789 Encounter for other orthopedic aftercare: Secondary | ICD-10-CM | POA: Diagnosis not present

## 2011-07-10 DIAGNOSIS — F17200 Nicotine dependence, unspecified, uncomplicated: Secondary | ICD-10-CM | POA: Diagnosis not present

## 2011-07-10 DIAGNOSIS — I739 Peripheral vascular disease, unspecified: Secondary | ICD-10-CM | POA: Insufficient documentation

## 2011-07-10 DIAGNOSIS — Z880 Allergy status to penicillin: Secondary | ICD-10-CM | POA: Diagnosis not present

## 2011-07-10 DIAGNOSIS — R918 Other nonspecific abnormal finding of lung field: Secondary | ICD-10-CM | POA: Diagnosis not present

## 2011-07-10 LAB — TSH: TSH: 1.251 u[IU]/mL (ref 0.350–4.500)

## 2011-07-10 LAB — CBC
MCH: 29.9 pg (ref 26.0–34.0)
MCV: 87.4 fL (ref 78.0–100.0)
Platelets: 159 10*3/uL (ref 150–400)
Platelets: 152 10*3/uL (ref 150–400)
RBC: 5.22 MIL/uL — ABNORMAL HIGH (ref 3.87–5.11)
RDW: 14.3 % (ref 11.5–15.5)
RDW: 14.1 % (ref 11.5–15.5)
WBC: 4.1 10*3/uL (ref 4.0–10.5)

## 2011-07-10 LAB — COMPREHENSIVE METABOLIC PANEL
ALT: 15 U/L (ref 0–35)
AST: 21 U/L (ref 0–37)
Albumin: 3.6 g/dL (ref 3.5–5.2)
CO2: 31 mEq/L (ref 19–32)
Calcium: 8.8 mg/dL (ref 8.4–10.5)
GFR calc non Af Amer: 83 mL/min — ABNORMAL LOW (ref >90)
Sodium: 134 mEq/L — ABNORMAL LOW (ref 135–145)
Total Protein: 6.3 g/dL (ref 6.0–8.3)

## 2011-07-10 LAB — INFLUENZA PANEL BY PCR (TYPE A & B)
H1N1 flu by pcr: NOT DETECTED
Influenza A By PCR: NEGATIVE

## 2011-07-10 LAB — POCT I-STAT, CHEM 8
BUN: 10 mg/dL (ref 6–23)
Calcium, Ion: 1.08 mmol/L — ABNORMAL LOW (ref 1.12–1.32)
Chloride: 94 mEq/L — ABNORMAL LOW (ref 96–112)
Creatinine, Ser: 0.9 mg/dL (ref 0.50–1.10)
Glucose, Bld: 127 mg/dL — ABNORMAL HIGH (ref 70–99)
Potassium: 3.1 mEq/L — ABNORMAL LOW (ref 3.5–5.1)

## 2011-07-10 LAB — CREATININE, SERUM: GFR calc Af Amer: >90 mL/min (ref >90)

## 2011-07-10 MED ORDER — LEVALBUTEROL HCL 0.63 MG/3ML IN NEBU
0.6300 mg | INHALATION_SOLUTION | Freq: Four times a day (QID) | RESPIRATORY_TRACT | Status: DC
  Administered 2011-07-10 – 2011-07-12 (×9): 0.63 mg via RESPIRATORY_TRACT
  Filled 2011-07-10 (×13): qty 3

## 2011-07-10 MED ORDER — LEVOFLOXACIN IN D5W 750 MG/150ML IV SOLN
750.0000 mg | INTRAVENOUS | Status: DC
  Administered 2011-07-10 – 2011-07-14 (×3): 750 mg via INTRAVENOUS
  Filled 2011-07-10 (×4): qty 150

## 2011-07-10 MED ORDER — VERAPAMIL HCL ER 240 MG PO TBCR
240.0000 mg | EXTENDED_RELEASE_TABLET | Freq: Every day | ORAL | Status: DC
  Administered 2011-07-10 – 2011-07-15 (×6): 240 mg via ORAL
  Filled 2011-07-10 (×6): qty 1

## 2011-07-10 MED ORDER — METHYLPREDNISOLONE SODIUM SUCC 125 MG IJ SOLR
80.0000 mg | Freq: Two times a day (BID) | INTRAMUSCULAR | Status: DC
  Administered 2011-07-10 – 2011-07-12 (×4): 80 mg via INTRAVENOUS
  Administered 2011-07-12: 125 mg via INTRAVENOUS
  Administered 2011-07-13 – 2011-07-15 (×5): 80 mg via INTRAVENOUS
  Filled 2011-07-10 (×12): qty 1.28

## 2011-07-10 MED ORDER — POTASSIUM CHLORIDE CRYS ER 20 MEQ PO TBCR
40.0000 meq | EXTENDED_RELEASE_TABLET | Freq: Every day | ORAL | Status: DC
  Administered 2011-07-10: 40 meq via ORAL
  Filled 2011-07-10 (×2): qty 2

## 2011-07-10 MED ORDER — ONDANSETRON HCL 4 MG/2ML IJ SOLN: 4.0000 mg | Freq: Four times a day (QID) | INTRAMUSCULAR | Status: DC | PRN

## 2011-07-10 MED ORDER — GUAIFENESIN ER 600 MG PO TB12
600.0000 mg | ORAL_TABLET | Freq: Two times a day (BID) | ORAL | Status: DC
  Administered 2011-07-10 – 2011-07-15 (×11): 600 mg via ORAL
  Filled 2011-07-10 (×12): qty 1

## 2011-07-10 MED ORDER — DABIGATRAN ETEXILATE MESYLATE 150 MG PO CAPS
150.0000 mg | ORAL_CAPSULE | Freq: Two times a day (BID) | ORAL | Status: DC
  Administered 2011-07-10 – 2011-07-15 (×11): 150 mg via ORAL
  Filled 2011-07-10 (×12): qty 1

## 2011-07-10 MED ORDER — PREDNISONE 20 MG PO TABS
60.0000 mg | ORAL_TABLET | Freq: Once | ORAL | Status: AC
  Administered 2011-07-10: 60 mg via ORAL
  Filled 2011-07-10: qty 3

## 2011-07-10 MED ORDER — IPRATROPIUM BROMIDE 0.02 % IN SOLN
0.5000 mg | Freq: Four times a day (QID) | RESPIRATORY_TRACT | Status: DC
  Administered 2011-07-10 – 2011-07-12 (×9): 0.5 mg via RESPIRATORY_TRACT
  Filled 2011-07-10 (×9): qty 2.5

## 2011-07-10 MED ORDER — POTASSIUM CHLORIDE CRYS ER 20 MEQ PO TBCR
40.0000 meq | EXTENDED_RELEASE_TABLET | Freq: Once | ORAL | Status: AC
  Administered 2011-07-10: 40 meq via ORAL
  Filled 2011-07-10: qty 2

## 2011-07-10 MED ORDER — NICOTINE 21 MG/24HR TD PT24
21.0000 mg | MEDICATED_PATCH | Freq: Every day | TRANSDERMAL | Status: DC
  Administered 2011-07-10 – 2011-07-15 (×6): 21 mg via TRANSDERMAL
  Filled 2011-07-10 (×6): qty 1

## 2011-07-10 MED ORDER — CILOSTAZOL 100 MG PO TABS
100.0000 mg | ORAL_TABLET | Freq: Every day | ORAL | Status: DC
Start: 2011-07-10 — End: 2011-07-15
  Administered 2011-07-10 – 2011-07-15 (×6): 100 mg via ORAL
  Filled 2011-07-10 (×6): qty 1

## 2011-07-10 MED ORDER — POTASSIUM CHLORIDE IN NACL 40-0.9 MEQ/L-% IV SOLN
INTRAVENOUS | Status: DC
  Administered 2011-07-10 – 2011-07-11 (×2): via INTRAVENOUS
  Filled 2011-07-10 (×4): qty 1000

## 2011-07-10 MED ORDER — LEVALBUTEROL HCL 1.25 MG/3ML IN NEBU
1.2500 mg | INHALATION_SOLUTION | RESPIRATORY_TRACT | Status: AC
  Administered 2011-07-10: 1.25 mg via RESPIRATORY_TRACT
  Filled 2011-07-10: qty 3

## 2011-07-10 MED ORDER — ACETAMINOPHEN 650 MG RE SUPP: 650.0000 mg | Freq: Four times a day (QID) | RECTAL | Status: DC | PRN

## 2011-07-10 MED ORDER — MORPHINE SULFATE 2 MG/ML IJ SOLN
1.0000 mg | INTRAMUSCULAR | Status: DC | PRN
  Filled 2011-07-10: qty 1

## 2011-07-10 MED ORDER — ONDANSETRON HCL 4 MG PO TABS: 4.0000 mg | ORAL_TABLET | Freq: Four times a day (QID) | ORAL | Status: DC | PRN

## 2011-07-10 MED ORDER — METHYLPREDNISOLONE SODIUM SUCC 125 MG IJ SOLR
125.0000 mg | Freq: Four times a day (QID) | INTRAMUSCULAR | Status: DC
  Administered 2011-07-10: 125 mg via INTRAVENOUS
  Filled 2011-07-10: qty 2

## 2011-07-10 MED ORDER — PANTOPRAZOLE SODIUM 40 MG PO TBEC
40.0000 mg | DELAYED_RELEASE_TABLET | Freq: Every day | ORAL | Status: DC
  Administered 2011-07-10 – 2011-07-15 (×6): 40 mg via ORAL
  Filled 2011-07-10 (×6): qty 1

## 2011-07-10 MED ORDER — ACETAMINOPHEN 325 MG PO TABS
650.0000 mg | ORAL_TABLET | Freq: Four times a day (QID) | ORAL | Status: DC | PRN
  Administered 2011-07-12: 650 mg via ORAL
  Filled 2011-07-10: qty 2

## 2011-07-10 MED ORDER — HYDROCHLOROTHIAZIDE 12.5 MG PO CAPS
12.5000 mg | ORAL_CAPSULE | Freq: Every day | ORAL | Status: DC
  Administered 2011-07-10 – 2011-07-15 (×6): 12.5 mg via ORAL
  Filled 2011-07-10 (×6): qty 1

## 2011-07-10 MED ORDER — ENOXAPARIN SODIUM 40 MG/0.4ML ~~LOC~~ SOLN: 40.0000 mg | SUBCUTANEOUS | Status: DC

## 2011-07-10 MED ORDER — HYDROCHLOROTHIAZIDE 25 MG PO TABS
12.5000 mg | ORAL_TABLET | Freq: Every day | ORAL | Status: DC
  Filled 2011-07-10: qty 0.5

## 2011-07-10 NOTE — H&P (Signed)
LAVETA GILKEY is an 77 y.o. female.   Chief Complaint: Shortness of Breath HPI: 76 yo with known COPD and Tobacco abuse here with 3 days of progressive SOB and cough. No Chest pain, no orthopnea, no PND. Has been wheezing. Has tried her home therapy but no relief. Denies hemoptysis. Has felt a little better in the ER but still wheezing. Had some cold symptoms recently which might have triggered her attack.  Past Medical History  Diagnosis Date  . Hypertension   . GERD (gastroesophageal reflux disease)   . Cerebrovascular disease   . Peripheral vascular disease     40-59% left carotid stenosis, 0-39% right  . PAD (peripheral artery disease)     lower extremity PAD with bilateral leg stenting  . TIA (transient ischemic attack)   . COPD (chronic obstructive pulmonary disease)     Past Surgical History  Procedure Date  . Appendectomy   . Partial hysterectomy   . Gallbladder surgery   . Cholecystectomy     History reviewed. No pertinent family history. Social History:  reports that she has been smoking.  She does not have any smokeless tobacco history on file. She reports that she does not drink alcohol. Her drug history not on file.  Allergies:  Allergies  Allergen Reactions  . Ibuprofen     Gi upset  . Advair Hfa     Confusion and shortness of breath  . Albuterol     Confusion and shortness of breath  . Amoxicillin Hives  . Cephalexin Hives  . Codeine     confusion  . Etanercept     vertigo  . Lorazepam     hallucinations  . Nalbuphine   . Oxycodone Hcl Nausea And Vomiting  . Oxycodone-Acetaminophen Nausea And Vomiting    Medications Prior to Admission  Medication Dose Route Frequency Provider Last Rate Last Dose  . levalbuterol (XOPENEX) nebulizer solution 1.25 mg  1.25 mg Nebulization To Major Sunnie Nielsen, MD   1.25 mg at 07/10/11 0130  . levalbuterol (XOPENEX) nebulizer solution 1.25 mg  1.25 mg Nebulization To Major Sunnie Nielsen, MD   1.25 mg at 07/10/11 0306  .  potassium chloride SA (K-DUR,KLOR-CON) CR tablet 40 mEq  40 mEq Oral Once Sunnie Nielsen, MD   40 mEq at 07/10/11 0152  . predniSONE (DELTASONE) tablet 60 mg  60 mg Oral Once Sunnie Nielsen, MD   60 mg at 07/10/11 0108   Medications Prior to Admission  Medication Sig Dispense Refill  . cilostazol (PLETAL) 100 MG tablet Take 100 mg by mouth daily.       . dabigatran (PRADAXA) 150 MG CAPS Take 1 capsule (150 mg total) by mouth every 12 (twelve) hours.  60 capsule  6  . esomeprazole (NEXIUM) 40 MG capsule Take 1 capsule (40 mg total) by mouth daily before breakfast.  30 capsule  11  . hydrochlorothiazide (HYDRODIURIL) 12.5 MG tablet Take 12.5 mg by mouth daily.        . verapamil (CALAN-SR) 240 MG CR tablet Take 240 mg by mouth daily.          Results for orders placed during the hospital encounter of 07/10/11 (from the past 48 hour(s))  CBC     Status: Abnormal   Collection Time   07/10/11 12:46 AM      Component Value Range Comment   WBC 6.2  4.0 - 10.5 (K/uL)    RBC 5.22 (*) 3.87 - 5.11 (MIL/uL)    Hemoglobin  15.6 (*) 12.0 - 15.0 (g/dL)    HCT 16.1  09.6 - 04.5 (%)    MCV 87.0  78.0 - 100.0 (fL)    MCH 29.9  26.0 - 34.0 (pg)    MCHC 34.4  30.0 - 36.0 (g/dL)    RDW 40.9  81.1 - 91.4 (%)    Platelets 159  150 - 400 (K/uL)   COMPREHENSIVE METABOLIC PANEL     Status: Abnormal   Collection Time   07/10/11 12:46 AM      Component Value Range Comment   Sodium 134 (*) 135 - 145 (mEq/L)    Potassium 3.0 (*) 3.5 - 5.1 (mEq/L)    Chloride 94 (*) 96 - 112 (mEq/L)    CO2 31  19 - 32 (mEq/L)    Glucose, Bld 128 (*) 70 - 99 (mg/dL)    BUN 11  6 - 23 (mg/dL)    Creatinine, Ser 7.82  0.50 - 1.10 (mg/dL)    Calcium 8.8  8.4 - 10.5 (mg/dL)    Total Protein 6.3  6.0 - 8.3 (g/dL)    Albumin 3.6  3.5 - 5.2 (g/dL)    AST 21  0 - 37 (U/L)    ALT 15  0 - 35 (U/L)    Alkaline Phosphatase 86  39 - 117 (U/L)    Total Bilirubin 0.5  0.3 - 1.2 (mg/dL)    GFR calc non Af Amer 83 (*) >90 (mL/min)    GFR calc Af  Amer >90  >90 (mL/min)   POCT I-STAT, CHEM 8     Status: Abnormal   Collection Time   07/10/11  1:04 AM      Component Value Range Comment   Sodium 137  135 - 145 (mEq/L)    Potassium 3.1 (*) 3.5 - 5.1 (mEq/L)    Chloride 94 (*) 96 - 112 (mEq/L)    BUN 10  6 - 23 (mg/dL)    Creatinine, Ser 9.56  0.50 - 1.10 (mg/dL)    Glucose, Bld 213 (*) 70 - 99 (mg/dL)    Calcium, Ion 0.86 (*) 1.12 - 1.32 (mmol/L)    TCO2 32  0 - 100 (mmol/L)    Hemoglobin 16.3 (*) 12.0 - 15.0 (g/dL)    HCT 57.8 (*) 46.9 - 46.0 (%)   POCT I-STAT TROPONIN I     Status: Normal   Collection Time   07/10/11  1:05 AM      Component Value Range Comment   Troponin i, poc 0.01  0.00 - 0.08 (ng/mL)    Comment 3             Dg Chest Portable 1 View  07/10/2011  *RADIOLOGY REPORT*  Clinical Data: Shortness of breath  PORTABLE CHEST - 1 VIEW  Comparison: 06/22/2010  Findings: Shallow inspiration.  Heart size and pulmonary vascularity are normal.  Mild hyperinflation consistent with emphysematous change.  No focal airspace consolidation in the lungs.  No blunting of costophrenic angles.  No pneumothorax. Calcification of the aorta.  Old right rib fractures.  New healing left rib fractures since the previous study.  IMPRESSION: Emphysematous changes in the lungs.  No evidence of active pulmonary disease.  New healing left rib fractures.  Old right rib fractures.  Original Report Authenticated By: Marlon Pel, M.D.    Review of Systems  Constitutional: Negative.   HENT: Negative.   Eyes: Negative.   Respiratory: Positive for cough, sputum production, shortness of breath and wheezing. Negative  for hemoptysis.   Cardiovascular: Positive for palpitations. Negative for orthopnea, claudication, leg swelling and PND.  Gastrointestinal: Negative.   Genitourinary: Negative.   Musculoskeletal: Negative.   Skin: Negative.   Neurological: Negative.   Endo/Heme/Allergies: Negative.   Psychiatric/Behavioral: Negative.     Blood  pressure 108/66, pulse 121, temperature 98.2 F (36.8 C), temperature source Oral, resp. rate 22, height 5\' 4"  (1.626 m), weight 56.246 kg (124 lb), SpO2 94.00%. Physical Exam  Nursing note and vitals reviewed. Constitutional: She is oriented to person, place, and time. She appears well-developed and well-nourished.  HENT:  Head: Normocephalic and atraumatic.  Right Ear: External ear normal.  Left Ear: External ear normal.  Nose: Nose normal.  Mouth/Throat: Oropharynx is clear and moist.  Eyes: Conjunctivae and EOM are normal. Pupils are equal, round, and reactive to light.  Neck: Normal range of motion. Neck supple.  Cardiovascular: An irregularly irregular rhythm present. Exam reveals no friction rub.   No murmur heard. Respiratory: No respiratory distress. She has wheezes. She has rales. She exhibits no tenderness.  GI: Soft. Bowel sounds are normal.  Musculoskeletal: Normal range of motion.  Neurological: She is alert and oriented to person, place, and time. She has normal reflexes.  Skin: Skin is warm and dry.  Psychiatric: She has a normal mood and affect. Her behavior is normal. Judgment and thought content normal.     Assessment/Plan 1. COPD Exacerbation: Continue Steroids, Nebs and antibiotics with Oxygen 2. Afib: Some RVr from tachypnea. Continue Verapamil and avoid albuterol 3.Hypokalemia: Replete 4: Tobacco abuse: tobacco cessation counseling. Nicotine patch if needed.  GARBA,LAWAL 07/10/2011, 5:52 AM

## 2011-07-10 NOTE — ED Notes (Signed)
Makenze Ellett is daughter and her number is [phone] cell. Clide Deutscher is also daughter and her numbers are [phone] home and [phone] cell.

## 2011-07-10 NOTE — Progress Notes (Signed)
Pt and her daughter state that patient is recurrent ear infections yearly.  Pt and daughter feel that pt may have one now and requesting this be evaluated while she is in the hospital.  MD paged and made aware.

## 2011-07-10 NOTE — Progress Notes (Signed)
Utilization review completed.  

## 2011-07-10 NOTE — Progress Notes (Signed)
Subjective: Patient seen and examined this am. feels her SOB to better with treatment but felt very tired. Also c/o off and on ear aches without discharge and tenderness over rt mastoid on pressure  Objective:  Vital signs in last 24 hours:  Filed Vitals:   07/10/11 0653 07/10/11 0908 07/10/11 1119 07/10/11 1407  BP: 116/74  109/71   Pulse: 113  110   Temp: 97.5 F (36.4 C)     TempSrc: Oral     Resp: 22  24   Height: 5\' 4"  (1.626 m)     Weight: 57.5 kg (126 lb 12.2 oz)     SpO2: 91% 93% 96% 97%    Intake/Output from previous day:  No intake or output data in the 24 hours ending 07/10/11 1606  Physical Exam:  General:elderly thin built female in no acute distress. HEENT: no pallor, no icterus, moist oral mucosa, no JVD, no lymphadenopathy. Ear examined with otoscope, no tenderness over external ear canal. Significant wax over rt ear canal obscuring view of TM, no bleeding noted. Wax over left ear canal, normal TM. Tender to pressure over rt mastoid prominence.  Heart:  s1 &s2  Irregular,  without murmurs, rubs, gallops. Lungs: scattered ronchi Abdomen: Soft, nontender, nondistended, positive bowel sounds. Extremities: No clubbing cyanosis or edema with positive pedal pulses. Neuro: Alert, awake, oriented x3, nonfocal.   Lab Results:  Basic Metabolic Panel:    Component Value Date/Time   NA 137 07/10/2011 0104   K 3.1* 07/10/2011 0104   CL 94* 07/10/2011 0104   CO2 31 07/10/2011 0046   BUN 10 07/10/2011 0104   CREATININE 0.65 07/10/2011 0908   GLUCOSE 127* 07/10/2011 0104   CALCIUM 8.8 07/10/2011 0046   CBC:    Component Value Date/Time   WBC 4.1 07/10/2011 0908   HGB 15.5* 07/10/2011 0908   HCT 45.9 07/10/2011 0908   PLT 152 07/10/2011 0908   MCV 87.4 07/10/2011 0908   NEUTROABS 6.0 02/25/2010 0552   LYMPHSABS 0.8 02/25/2010 0552   MONOABS 0.5 02/25/2010 0552   EOSABS 0.0 02/25/2010 0552   BASOSABS 0.0 02/25/2010 0552    No results found for this or any previous visit (from the past  240 hour(s)).  Studies/Results: Dg Chest Portable 1 View  07/10/2011  *RADIOLOGY REPORT*  Clinical Data: Shortness of breath  PORTABLE CHEST - 1 VIEW  Comparison: 06/22/2010  Findings: Shallow inspiration.  Heart size and pulmonary vascularity are normal.  Mild hyperinflation consistent with emphysematous change.  No focal airspace consolidation in the lungs.  No blunting of costophrenic angles.  No pneumothorax. Calcification of the aorta.  Old right rib fractures.  New healing left rib fractures since the previous study.  IMPRESSION: Emphysematous changes in the lungs.  No evidence of active pulmonary disease.  New healing left rib fractures.  Old right rib fractures.  Original Report Authenticated By: Marlon Pel, M.D.    Medications: Scheduled Meds:   . cilostazol  100 mg Oral Daily  . dabigatran  150 mg Oral Q12H  . guaiFENesin  600 mg Oral BID  . hydrochlorothiazide  12.5 mg Oral Daily  . ipratropium  0.5 mg Nebulization Q6H  . levalbuterol  0.63 mg Nebulization Q6H  . levalbuterol  1.25 mg Nebulization To Major  . levalbuterol  1.25 mg Nebulization To Major  . levofloxacin (LEVAQUIN) IV  750 mg Intravenous Q48H  . methylPREDNISolone (SOLU-MEDROL) injection  80 mg Intravenous Q12H  . nicotine  21 mg Transdermal Daily  .  pantoprazole  40 mg Oral Daily  . potassium chloride  40 mEq Oral Once  . predniSONE  60 mg Oral Once  . verapamil  240 mg Oral Daily  . DISCONTD: enoxaparin  40 mg Subcutaneous Q24H  . DISCONTD: hydrochlorothiazide  12.5 mg Oral Daily  . DISCONTD: methylPREDNISolone (SOLU-MEDROL) injection  125 mg Intravenous Q6H   Continuous Infusions:   . 0.9 % NaCl with KCl 40 mEq / L 100 mL/hr at 07/10/11 0740   PRN Meds:.acetaminophen, acetaminophen, morphine, ondansetron (ZOFRAN) IV, ondansetron  Assessment 24 female with active tobacco use , known COPD, afib , HTN, hx of CVA, and PVD   presented with progressive SOB with cough likely in the setting of COPD  exacerbation.  Plan: COPD  Exacerbation  cont IV solumedrol. Dose reduced to 80 mh bid  cont nebs, on xopenex as patient not able to tolerate albuterol 02 via [place] Po levaquin Will order nicotine patch  counseled on smoking cessations  HTN Cont HCTZ,     GERD  cont PPI  PVD  cont cilostazol   Afib with RVR  cont pradaxa Cont verapamil. Rate controlled   Rt ear pain with mastoid tenderness  given persistent recurrent b/l ear pain symptoms with rt mastoid tenderness, will get head CT to r/o masstoiditis   Hypokalemia  being repleted with kcl   Full code  LOS: 0 days   Caroline [name] 07/10/2011, 4:06 PM

## 2011-07-10 NOTE — ED Provider Notes (Signed)
History     CSN: [id number]  Arrival date & time 07/10/11  0030   First MD Initiated Contact with Patient 07/10/11 0041      Chief Complaint  Patient presents with  . Shortness of Breath    (Consider location/radiation/quality/duration/timing/severity/associated sxs/prior treatment) The history is provided by the patient.   patient lives in [place] has history of COPD has been worse over the last week and more sore last few days. He has had a cough some congestion and increased wheezing despite medications at home. No chest pain. No fevers or chills. No known sick contacts. Moderate in severity. No significantly factors. Gets worse with any type of exertion or getting up walking around. Symptoms persisted and constant and progressive.  Past Medical History  Diagnosis Date  . Hypertension   . GERD (gastroesophageal reflux disease)   . Cerebrovascular disease   . Peripheral vascular disease     40-59% left carotid stenosis, 0-39% right  . PAD (peripheral artery disease)     lower extremity PAD with bilateral leg stenting  . TIA (transient ischemic attack)   . COPD (chronic obstructive pulmonary disease)     Past Surgical History  Procedure Date  . Appendectomy   . Partial hysterectomy   . Gallbladder surgery   . Cholecystectomy     History reviewed. No pertinent family history.  History  Substance Use Topics  . Smoking status: Current Everyday Smoker -- 1.0 packs/day  . Smokeless tobacco: Not on file  . Alcohol Use: No    OB History    Grav Para Term Preterm Abortions TAB SAB Ect Mult Living                  Review of Systems  Constitutional: Negative for fever and chills.  HENT: Positive for congestion. Negative for neck pain and neck stiffness.   Eyes: Negative for pain.  Respiratory: Positive for cough and shortness of breath.   Cardiovascular: Negative for chest pain.  Gastrointestinal: Negative for abdominal pain.  Genitourinary: Negative for dysuria.   Musculoskeletal: Negative for back pain.  Skin: Negative for rash.  Neurological: Negative for headaches.  All other systems reviewed and are negative.    Allergies  Ibuprofen; Advair hfa; Albuterol; Amoxicillin; Cephalexin; Codeine; Etanercept; Lorazepam; Nalbuphine; Oxycodone hcl; and Oxycodone-acetaminophen  Home Medications   Current Outpatient Rx  Name Route Sig Dispense Refill  . CILOSTAZOL 100 MG PO TABS Oral Take 100 mg by mouth daily.     Marland Kitchen DABIGATRAN ETEXILATE MESYLATE 150 MG PO CAPS Oral Take 1 capsule (150 mg total) by mouth every 12 (twelve) hours. 60 capsule 6  . ESOMEPRAZOLE MAGNESIUM 40 MG PO CPDR Oral Take 1 capsule (40 mg total) by mouth daily before breakfast. 30 capsule 11  . HYDROCHLOROTHIAZIDE 12.5 MG PO TABS Oral Take 12.5 mg by mouth daily.      Marland Kitchen VERAPAMIL HCL 240 MG PO TBCR Oral Take 240 mg by mouth daily.        BP 93/61  Pulse 101  Temp(Src) 99.9 F (37.7 C) (Rectal)  Resp 24  Ht 5\' 4"  (1.626 m)  Wt 124 lb (56.246 kg)  BMI 21.28 kg/m2  SpO2 97%  Physical Exam  Constitutional: She is oriented to person, place, and time. She appears well-developed and well-nourished.  HENT:  Head: Normocephalic and atraumatic.  Eyes: Conjunctivae and EOM are normal. Pupils are equal, round, and reactive to light.  Neck: Trachea normal. Neck supple. No thyromegaly present.  Cardiovascular: Normal  rate, regular rhythm, S1 normal, S2 normal and normal pulses.     No systolic murmur is present   No diastolic murmur is present  Pulses:      Radial pulses are 2+ on the right side, and 2+ on the left side.  Pulmonary/Chest: She has no rhonchi.       Inspiratory and expiratory wheezes with mild tachypnea and mildly decreased air movement  Abdominal: Soft. Normal appearance and bowel sounds are normal. There is no tenderness. There is no CVA tenderness and negative Murphy's sign.  Musculoskeletal:       BLE:s Calves nontender, no cords or erythema, negative Homans  sign  Neurological: She is alert and oriented to person, place, and time. She has normal strength. No cranial nerve deficit or sensory deficit. GCS eye subscore is 4. GCS verbal subscore is 5. GCS motor subscore is 6.  Skin: Skin is warm and dry. No rash noted. She is not diaphoretic.  Psychiatric: Her speech is normal.       Cooperative and appropriate    ED Course  Procedures (including critical care time)  Labs Reviewed  CBC - Abnormal; Notable for the following:    RBC 5.22 (*)    Hemoglobin 15.6 (*)    All other components within normal limits  COMPREHENSIVE METABOLIC PANEL - Abnormal; Notable for the following:    Sodium 134 (*)    Potassium 3.0 (*)    Chloride 94 (*)    Glucose, Bld 128 (*)    GFR calc non Af Amer 83 (*)    All other components within normal limits  POCT I-STAT, CHEM 8 - Abnormal; Notable for the following:    Potassium 3.1 (*)    Chloride 94 (*)    Glucose, Bld 127 (*)    Calcium, Ion 1.08 (*)    Hemoglobin 16.3 (*)    HCT 48.0 (*)    All other components within normal limits  POCT I-STAT TROPONIN I  I-STAT, CHEM 8  I-STAT TROPONIN I   Dg Chest Portable 1 View  07/10/2011  *RADIOLOGY REPORT*  Clinical Data: Shortness of breath  PORTABLE CHEST - 1 VIEW  Comparison: 06/22/2010  Findings: Shallow inspiration.  Heart size and pulmonary vascularity are normal.  Mild hyperinflation consistent with emphysematous change.  No focal airspace consolidation in the lungs.  No blunting of costophrenic angles.  No pneumothorax. Calcification of the aorta.  Old right rib fractures.  New healing left rib fractures since the previous study.  IMPRESSION: Emphysematous changes in the lungs.  No evidence of active pulmonary disease.  New healing left rib fractures.  Old right rib fractures.  Original Report Authenticated By: Marlon Pel, M.D.    Serial evaluations and multiple breathing treatments with persistent wheezes and shortness of breath consistent with COPD  exacerbation,   Case discussed as above with triad hospitalist at 4:10 AM and agreed to admission.   MDM   COPD exacerbation likely complicated by viral URI. Medicine consultation and admit.        Sunnie Nielsen, MD 07/11/11 [phone]

## 2011-07-10 NOTE — ED Notes (Signed)
Spoke to Dr. Mikeal Hawthorne about getting an order for telemetry. He said that the order is in there.

## 2011-07-10 NOTE — ED Notes (Signed)
Pt states that she has had a cough for some time. Pt states that she became short of breath this afternoon at 1700. Pt is in via ems. Room air sats is 98%.

## 2011-07-11 DIAGNOSIS — F17200 Nicotine dependence, unspecified, uncomplicated: Secondary | ICD-10-CM | POA: Diagnosis not present

## 2011-07-11 DIAGNOSIS — J449 Chronic obstructive pulmonary disease, unspecified: Secondary | ICD-10-CM | POA: Diagnosis not present

## 2011-07-11 DIAGNOSIS — E875 Hyperkalemia: Secondary | ICD-10-CM | POA: Diagnosis not present

## 2011-07-11 LAB — BASIC METABOLIC PANEL
CO2: 29 mEq/L (ref 19–32)
Calcium: 9.1 mg/dL (ref 8.4–10.5)
GFR calc non Af Amer: >90 mL/min (ref >90)
Sodium: 140 mEq/L (ref 135–145)

## 2011-07-11 MED ORDER — SODIUM CHLORIDE 0.9 % IV SOLN
INTRAVENOUS | Status: DC
  Administered 2011-07-11 – 2011-07-12 (×3): via INTRAVENOUS

## 2011-07-11 MED ORDER — SODIUM POLYSTYRENE SULFONATE 15 GM/60ML PO SUSP
30.0000 g | Freq: Once | ORAL | Status: AC
  Administered 2011-07-11: 30 g via ORAL
  Filled 2011-07-11: qty 120

## 2011-07-11 NOTE — Progress Notes (Signed)
Subjective: Patient seen and examined this am. Feels her breathing to be better. Denies ear pains. Could not tolerate head CT due to anxierty and was cancelled  Objective:  Vital signs in last 24 hours:  Filed Vitals:   07/11/11 0916 07/11/11 1203 07/11/11 1300 07/11/11 1434  BP:  141/82 128/67   Pulse:  111 88   Temp:   97.5 F (36.4 C)   TempSrc:      Resp:  22 24   Height:      Weight:      SpO2: 95% 96% 94% 94%    Intake/Output from previous day:   Intake/Output Summary (Last 24 hours) at 07/11/11 1822 Last data filed at 07/11/11 1500  Gross per 24 hour  Intake 2731.66 ml  Output      0 ml  Net 2731.66 ml    Physical Exam: General:elderly thin built female in no acute distress.  HEENT: no pallor, no icterus, moist oral mucosa, no JVD, no lymphadenopathy. No mastoid tenderness Heart: s1 &s2 Irregular, without murmurs, rubs, gallops.  Lungs: scattered ronchi , improved from yesterday Abdomen: Soft, nontender, nondistended, positive bowel sounds.  Extremities: No clubbing cyanosis or edema with positive pedal pulses.  Neuro: Alert, awake, oriented x3, nonfocal.    Lab Results:  Basic Metabolic Panel:    Component Value Date/Time   NA 140 07/11/2011 0450   K 5.6* 07/11/2011 0450   CL 102 07/11/2011 0450   CO2 29 07/11/2011 0450   BUN 10 07/11/2011 0450   CREATININE 0.55 07/11/2011 0450   GLUCOSE 148* 07/11/2011 0450   CALCIUM 9.1 07/11/2011 0450   CBC:    Component Value Date/Time   WBC 4.1 07/10/2011 0908   HGB 15.5* 07/10/2011 0908   HCT 45.9 07/10/2011 0908   PLT 152 07/10/2011 0908   MCV 87.4 07/10/2011 0908   NEUTROABS 6.0 02/25/2010 0552   LYMPHSABS 0.8 02/25/2010 0552   MONOABS 0.5 02/25/2010 0552   EOSABS 0.0 02/25/2010 0552   BASOSABS 0.0 02/25/2010 0552    No results found for this or any previous visit (from the past 240 hour(s)).  Studies/Results: Dg Chest Portable 1 View  07/10/2011  *RADIOLOGY REPORT*  Clinical Data: Shortness of breath  PORTABLE CHEST - 1  VIEW  Comparison: 06/22/2010  Findings: Shallow inspiration.  Heart size and pulmonary vascularity are normal.  Mild hyperinflation consistent with emphysematous change.  No focal airspace consolidation in the lungs.  No blunting of costophrenic angles.  No pneumothorax. Calcification of the aorta.  Old right rib fractures.  New healing left rib fractures since the previous study.  IMPRESSION: Emphysematous changes in the lungs.  No evidence of active pulmonary disease.  New healing left rib fractures.  Old right rib fractures.  Original Report Authenticated By: Marlon Pel, M.D.    Medications: Scheduled Meds:   . cilostazol  100 mg Oral Daily  . dabigatran  150 mg Oral Q12H  . guaiFENesin  600 mg Oral BID  . hydrochlorothiazide  12.5 mg Oral Daily  . ipratropium  0.5 mg Nebulization Q6H  . levalbuterol  0.63 mg Nebulization Q6H  . levofloxacin (LEVAQUIN) IV  750 mg Intravenous Q48H  . methylPREDNISolone (SOLU-MEDROL) injection  80 mg Intravenous Q12H  . nicotine  21 mg Transdermal Daily  . pantoprazole  40 mg Oral Daily  . sodium polystyrene  30 g Oral Once  . verapamil  240 mg Oral Daily  . DISCONTD: potassium chloride  40 mEq Oral Daily  Continuous Infusions:   . sodium chloride 100 mL/hr at 07/11/11 1643  . DISCONTD: 0.9 % NaCl with KCl 40 mEq / L 100 mL/hr at 07/11/11 0505   PRN Meds:.acetaminophen, acetaminophen, morphine, ondansetron (ZOFRAN) IV, ondansetron  Assessment/Plan: Assessment  24 female with active tobacco use , known COPD, afib , HTN, hx of CVA, and PVD presented with progressive SOB with cough likely in the setting of COPD exacerbation.   Plan:  COPD Exacerbation  IV solumedrol80 mg bid  cont nebs, on xopenex as patient not able to tolerate albuterol  02 via [place]  Po levaquin   nicotine patch  counseled on smoking cessations    HTN  Cont HCTZ,   GERD  cont PPI   PVD  cont cilostazol   Afib with RVR  cont pradaxa  Cont verapamil. Rate  controlled   Rt ear pain with mastoid tenderness  given persistent recurrent b/l ear pain symptoms with rt mastoid tenderness, planned on getting head CT which she says  couldn't tolerate and got cancelled. denies any ear pain and no mastoid tenderness on exam  Hyperkalemia  Was being repleted with kcl for low k on admission  this morning k elevated to 5.6. Hold supplementation and and given 30 gm kayexalate. monitor k in am   Full code  If stable will d/c home on po steroids    LOS: 1 day   Braxdon Gappa 07/11/2011, 6:22 PM

## 2011-07-12 DIAGNOSIS — J449 Chronic obstructive pulmonary disease, unspecified: Secondary | ICD-10-CM | POA: Diagnosis not present

## 2011-07-12 DIAGNOSIS — I4891 Unspecified atrial fibrillation: Secondary | ICD-10-CM | POA: Diagnosis not present

## 2011-07-12 DIAGNOSIS — F17200 Nicotine dependence, unspecified, uncomplicated: Secondary | ICD-10-CM | POA: Diagnosis not present

## 2011-07-12 LAB — BASIC METABOLIC PANEL
Chloride: 102 mEq/L (ref 96–112)
Creatinine, Ser: 0.57 mg/dL (ref 0.50–1.10)
GFR calc Af Amer: >90 mL/min (ref >90)
GFR calc non Af Amer: 89 mL/min — ABNORMAL LOW (ref >90)

## 2011-07-12 MED ORDER — LEVALBUTEROL HCL 0.63 MG/3ML IN NEBU
0.6300 mg | INHALATION_SOLUTION | RESPIRATORY_TRACT | Status: DC | PRN
  Filled 2011-07-12: qty 3

## 2011-07-12 MED ORDER — METOPROLOL TARTRATE 12.5 MG HALF TABLET
12.5000 mg | ORAL_TABLET | Freq: Two times a day (BID) | ORAL | Status: DC
  Administered 2011-07-12 – 2011-07-15 (×7): 12.5 mg via ORAL
  Filled 2011-07-12 (×8): qty 1

## 2011-07-12 MED ORDER — IPRATROPIUM BROMIDE 0.02 % IN SOLN
0.5000 mg | RESPIRATORY_TRACT | Status: DC
  Administered 2011-07-12 – 2011-07-14 (×12): 0.5 mg via RESPIRATORY_TRACT
  Filled 2011-07-12 (×12): qty 2.5

## 2011-07-12 MED ORDER — LEVALBUTEROL HCL 0.63 MG/3ML IN NEBU
0.6300 mg | INHALATION_SOLUTION | RESPIRATORY_TRACT | Status: DC
  Administered 2011-07-12 – 2011-07-14 (×12): 0.63 mg via RESPIRATORY_TRACT
  Filled 2011-07-12 (×18): qty 3

## 2011-07-12 NOTE — Progress Notes (Signed)
Subjective: Patient seen and examined this am. Feels tired. Also noted to be still tachy on the monitor  Objective:  Vital signs in last 24 hours:  Filed Vitals:   07/12/11 0500 07/12/11 1105 07/12/11 1115 07/12/11 1235  BP: 154/77 160/92    Pulse: 116  104   Temp: 97.2 F (36.2 C)     TempSrc: Oral     Resp: 24     Height:      Weight:      SpO2: 95%   93%    Intake/Output from previous day:   Intake/Output Summary (Last 24 hours) at 07/12/11 1242 Last data filed at 07/12/11 0900  Gross per 24 hour  Intake    360 ml  Output      0 ml  Net    360 ml    Physical Exam:    General:elderly thin built female in no acute distress.  HEENT: no pallor, no icterus, moist oral mucosa, no JVD, no lymphadenopathy. No mastoid tenderness  Heart: s1 &s2 Irregular, without murmurs, rubs, gallops.  Lungs: scattered ronchi ,unchaged from 1/3 Abdomen: Soft, nontender, nondistended, positive bowel sounds.  Extremities: No clubbing cyanosis or edema with positive pedal pulses.  Neuro: Alert, awake, oriented x3, nonfocal.  Lab Results:  Basic Metabolic Panel:    Component Value Date/Time   NA 138 07/12/2011 0525   K 4.4 07/12/2011 0525   CL 102 07/12/2011 0525   CO2 32 07/12/2011 0525   BUN 11 07/12/2011 0525   CREATININE 0.57 07/12/2011 0525   GLUCOSE 146* 07/12/2011 0525   CALCIUM 8.7 07/12/2011 0525   CBC:    Component Value Date/Time   WBC 4.1 07/10/2011 0908   HGB 15.5* 07/10/2011 0908   HCT 45.9 07/10/2011 0908   PLT 152 07/10/2011 0908   MCV 87.4 07/10/2011 0908   NEUTROABS 6.0 02/25/2010 0552   LYMPHSABS 0.8 02/25/2010 0552   MONOABS 0.5 02/25/2010 0552   EOSABS 0.0 02/25/2010 0552   BASOSABS 0.0 02/25/2010 0552    No results found for this or any previous visit (from the past 240 hour(s)).  Studies/Results: No results found.  Medications: Scheduled Meds:   . cilostazol  100 mg Oral Daily  . dabigatran  150 mg Oral Q12H  . guaiFENesin  600 mg Oral BID  . hydrochlorothiazide  12.5  mg Oral Daily  . ipratropium  0.5 mg Nebulization Q4H  . levalbuterol  0.63 mg Nebulization Q4H  . levofloxacin (LEVAQUIN) IV  750 mg Intravenous Q48H  . methylPREDNISolone (SOLU-MEDROL) injection  80 mg Intravenous Q12H  . metoprolol tartrate  12.5 mg Oral BID  . nicotine  21 mg Transdermal Daily  . pantoprazole  40 mg Oral Daily  . verapamil  240 mg Oral Daily  . DISCONTD: ipratropium  0.5 mg Nebulization Q6H  . DISCONTD: levalbuterol  0.63 mg Nebulization Q6H   Continuous Infusions:   . DISCONTD: sodium chloride 100 mL/hr at 07/12/11 0133   PRN Meds:.acetaminophen, acetaminophen, morphine, ondansetron (ZOFRAN) IV, ondansetron    Assessment  58 female with active tobacco use , known COPD, afib , HTN, hx of CVA, and PVD presented with progressive SOB with cough likely in the setting of COPD exacerbation.   Plan:  COPD Exacerbation  Continue IV solumedrol80 mg bid  cont nebs, on xopenex as patient not able to tolerate albuterol , will increase frequency to q 4hr as she is more short of breath today 02 via [place]  Po levaquin  nicotine patch  counseled on smoking cessations  Planned on d/c today but patient still quite symptomatic. Will monitor one more day  HTN  Cont HCTZ,   GERD  cont PPI   PVD  cont cilostazol   Afib with RVR  cont pradaxa  Cont verapamil. Still tachy on monitor . Will ad low dose metoprolol  Rt ear pain with mastoid tenderness  given persistent recurrent b/l ear pain symptoms with rt mastoid tenderness, planned on getting head CT which she says couldn't tolerate and got cancelled. denies any ear pain and no mastoid tenderness on exam   Hyperkalemia  resolved    LOS: 2 days   Caroline [name] 07/12/2011, 12:42 PM

## 2011-07-12 NOTE — Progress Notes (Signed)
Physical Therapy Evaluation  See Oxygen readings 07/12/11 at 15:45 under vitals Patient Details Name: Caroline [name] MRN: [id number] DOB: [birth year] Today's Date: 07/12/2011  Problem List:  Patient Active Problem List  Diagnoses  . TOBACCO ABUSE  . FIBRILLATION, ATRIAL  . CAROTID ARTERY DISEASE  . CVA  . ATHEROSCLEROSIS W/ REST PAIN  . PVD  . EMPHYSEMA  . LOW BACK PAIN, CHRONIC  . BURSITIS, OLECRANON  . EDEMA  . Fatigue  . Abdominal discomfort  . COPD with acute exacerbation  . Hypokalemia    Past Medical History:  Past Medical History  Diagnosis Date  . Hypertension   . GERD (gastroesophageal reflux disease)   . Cerebrovascular disease   . Peripheral vascular disease     40-59% left carotid stenosis, 0-39% right  . PAD (peripheral artery disease)     lower extremity PAD with bilateral leg stenting  . TIA (transient ischemic attack)   . COPD (chronic obstructive pulmonary disease)    Past Surgical History:  Past Surgical History  Procedure Date  . Appendectomy   . Partial hysterectomy   . Gallbladder surgery   . Cholecystectomy     PT Assessment/Plan/Recommendation PT Assessment Clinical Impression Statement: pt is a 77 y/o female adm with progressive SOB.  Pt needs acute and HHPT to address decr activity tolerance and energy conservation techniiques PT Recommendation/Assessment: Patient will need skilled PT in the acute care venue PT Problem List: Decreased activity tolerance;Decreased mobility;Decreased knowledge of precautions;Cardiopulmonary status limiting activity Barriers to Discharge: None PT Therapy Diagnosis :  (decr activity tolerance) PT Plan PT Frequency: Min 3X/week PT Treatment/Interventions: Functional mobility training;Therapeutic activities;Gait training PT Recommendation Follow Up Recommendations: Home health PT Equipment Recommended: None recommended by PT PT Goals  Acute Rehab PT Goals PT Goal Formulation: With patient/family Time For  Goal Achievement: 7 days Pt will Ambulate: 51 - 150 feet;with modified independence;with least restrictive assistive device;Other (comment) (maintaining sats >90% on O2) PT Goal: Ambulate - Progress: Not met Pt will Go Up / Down Stairs: 1-2 stairs;with modified independence;with rail(s) PT Goal: Up/Down Stairs - Progress: Not met  PT Evaluation Precautions/Restrictions    Prior Functioning  Home Living Lives With: Spouse Receives Help From: Family Type of Home: House Home Layout: One level;Able to live on main level with bedroom/bathroom Home Access: Stairs to enter Entrance Stairs-Number of Steps: 2 Bathroom Shower/Tub: Tub/shower unit;Curtain Bathroom Toilet: Standard Home Adaptive Equipment: Straight cane;Walker - rolling;Shower chair with back;Grab bars around toilet Prior Function Level of Independence: Independent with transfers;Independent with gait;Independent with homemaking with wheelchair;Independent with homemaking with ambulation;Independent with basic ADLs Able to Take Stairs?: Yes Cognition Cognition Arousal/Alertness: Awake/alert Overall Cognitive Status: Appears within functional limits for tasks assessed Sensation/Coordination Coordination Gross Motor Movements are Fluid and Coordinated: Yes Fine Motor Movements are Fluid and Coordinated: Not tested Extremity Assessment RUE Assessment RUE Assessment: Within Functional Limits LUE Assessment LUE Assessment: Within Functional Limits RLE Assessment RLE Assessment: Within Functional Limits LLE Assessment LLE Assessment: Within Functional Limits Mobility (including Balance) Bed Mobility Bed Mobility: No Transfers Transfers: Yes Sit to Stand: 5: Supervision;With upper extremity assist;From bed Stand to Sit: Other (comment);To bed;With upper extremity assist (min guard a) Ambulation/Gait Ambulation/Gait: Yes Ambulation/Gait Assistance: 5: Supervision Ambulation/Gait Assistance Details (indicate cue type and  reason): steady initially then became more unsteady with fatigue and inablitiy to get enough oxygen Ambulation Distance (Feet): 60 Feet Assistive device: Other (Comment) (pushing IV pole) Gait Pattern: Within Functional Limits;Shuffle  Posture/Postural Control Posture/Postural Control: No  significant limitations Balance Balance Assessed: No Exercise    End of Session PT - End of Session Activity Tolerance: Patient limited by fatigue;Other (comment) (lack of O2) Patient left: Other (comment);with call bell in reach;with family/visitor present (sittibng EOB) Nurse Communication: Other (comment) (need to get O2 consult) General Behavior During Session: [facility] for tasks performed Cognition: [facility] for tasks performed  Fernanda Twaddell, Eliseo Gum 07/12/2011, 4:29 PM  07/12/2011  Lasker Bing, PT [phone] (pager)

## 2011-07-13 DIAGNOSIS — J449 Chronic obstructive pulmonary disease, unspecified: Secondary | ICD-10-CM | POA: Diagnosis not present

## 2011-07-13 DIAGNOSIS — F17200 Nicotine dependence, unspecified, uncomplicated: Secondary | ICD-10-CM | POA: Diagnosis not present

## 2011-07-13 DIAGNOSIS — I4891 Unspecified atrial fibrillation: Secondary | ICD-10-CM | POA: Diagnosis not present

## 2011-07-13 NOTE — Progress Notes (Signed)
Subjective: Patient seen and examined this am. Husband and daughter at bedside  Objective:  Vital signs in last 24 hours:  Filed Vitals:   07/12/11 2100 07/13/11 0424 07/13/11 0500 07/13/11 1106  BP: 142/78  156/72 149/93  Pulse: 98  110 115  Temp: 97.7 F (36.5 C)  97.5 F (36.4 C)   TempSrc:      Resp: 20  24   Height:      Weight:      SpO2: 93% 92% 96%     Intake/Output from previous day:   Intake/Output Summary (Last 24 hours) at 07/13/11 1430 Last data filed at 07/13/11 0700  Gross per 24 hour  Intake    240 ml  Output      0 ml  Net    240 ml    Physical Exam:   General:elderly thin built female in no acute distress.  HEENT: no pallor, no icterus, moist oral mucosa, no JVD, no lymphadenopathy. No mastoid tenderness  Heart: s1 &s2 Irregular, without murmurs, rubs, gallops.  Lungs: scattered ronchi , b/l wheeze unchaged for past 2 days Abdomen: Soft, nontender, nondistended, positive bowel sounds.  Extremities: No clubbing cyanosis or edema with positive pedal pulses.  Neuro: Alert, awake, oriented x3, nonfocal.   Lab Results:  Basic Metabolic Panel:    Component Value Date/Time   NA 138 07/12/2011 0525   K 4.4 07/12/2011 0525   CL 102 07/12/2011 0525   CO2 32 07/12/2011 0525   BUN 11 07/12/2011 0525   CREATININE 0.57 07/12/2011 0525   GLUCOSE 146* 07/12/2011 0525   CALCIUM 8.7 07/12/2011 0525   CBC:    Component Value Date/Time   WBC 4.1 07/10/2011 0908   HGB 15.5* 07/10/2011 0908   HCT 45.9 07/10/2011 0908   PLT 152 07/10/2011 0908   MCV 87.4 07/10/2011 0908   NEUTROABS 6.0 02/25/2010 0552   LYMPHSABS 0.8 02/25/2010 0552   MONOABS 0.5 02/25/2010 0552   EOSABS 0.0 02/25/2010 0552   BASOSABS 0.0 02/25/2010 0552    No results found for this or any previous visit (from the past 240 hour(s)).  Studies/Results: No results found.  Medications: Scheduled Meds:   . cilostazol  100 mg Oral Daily  . dabigatran  150 mg Oral Q12H  . guaiFENesin  600 mg Oral BID  .  hydrochlorothiazide  12.5 mg Oral Daily  . ipratropium  0.5 mg Nebulization Q4H  . levalbuterol  0.63 mg Nebulization Q4H  . levofloxacin (LEVAQUIN) IV  750 mg Intravenous Q48H  . methylPREDNISolone (SOLU-MEDROL) injection  80 mg Intravenous Q12H  . metoprolol tartrate  12.5 mg Oral BID  . nicotine  21 mg Transdermal Daily  . pantoprazole  40 mg Oral Daily  . verapamil  240 mg Oral Daily   Continuous Infusions:  PRN Meds:.acetaminophen, acetaminophen, levalbuterol, morphine, ondansetron (ZOFRAN) IV, ondansetron Assessment  89 female with active tobacco use , known COPD, afib , HTN, hx of CVA, and PVD presented with progressive SOB with cough likely in the setting of COPD exacerbation.   Plan:  COPD Exacerbation  Continue IV solumedrol 80 mg bid  cont nebs, on xopenex as patient not able to tolerate albuterol , will increased frequency to q 4hr . She is very slowly responding to treatment and has been needing IV steroid as well as frequent nebs thus prolonging her stay 02 via [place] ,  desaturates on ambulation and will need to reassess need for home o2 prior to discharge Po levaquin  nicotine patch  counseled on smoking cessations   HTN  Cont HCTZ,   GERD  cont PPI   PVD  cont cilostazol   Afib with RVR  cont pradaxa  Cont verapamil. Still tachy on monitor . Added  low dose metoprolol   Rt ear pain with mastoid tenderness  given persistent recurrent b/l ear pain symptoms with rt mastoid tenderness, planned on getting head CT which she says couldn't tolerate and got cancelled. denies any ear pain and no mastoid tenderness on exam   Hyperkalemia  resolved   Full code   LOS: 3 days   Shameek Nyquist 07/13/2011, 2:30 PM

## 2011-07-13 NOTE — Progress Notes (Signed)
CARE MANAGEMENT NOTE 07/13/2011  Patient:  GENOVEVA, SINGLETON   Account Number:  [id number]  Date Initiated:  07/10/2011  Documentation initiated by:  Donn Pierini  Subjective/Objective Assessment:   Pt admitted with COPD exacerbation     Action/Plan:   PTA pt lived at home with spouse, was independent with ADLs   Anticipated DC Date:  07/12/2011   Anticipated DC Plan:  HOME/SELF CARE      DC Planning Services  CM consult      PAC Choice  DURABLE MEDICAL EQUIPMENT   Choice offered to / List presented to:  C-1 Patient   DME arranged  [facility]      DME agency  [facility]        Status of service:  In process, will continue to follow Medicare Important Message given?   (If response is "NO", the following Medicare IM given date fields will be blank) Date Medicare IM given:   Date Additional Medicare IM given:    Discharge Disposition:    Per UR Regulation:    Comments:  07/13/2011 1300 Call from Unit RN. Pt O2 sat dropping when ambulating, will need to evaluate close to d/c for home oxygen. Spoke to pt and daughter. Pt states she prefers [place] for oxygen. Will fax orders to [place] once orders received. Isidoro Donning RN CCM Case Mgmt phone [phone]

## 2011-07-14 DIAGNOSIS — F17200 Nicotine dependence, unspecified, uncomplicated: Secondary | ICD-10-CM | POA: Diagnosis not present

## 2011-07-14 DIAGNOSIS — J449 Chronic obstructive pulmonary disease, unspecified: Secondary | ICD-10-CM | POA: Diagnosis not present

## 2011-07-14 MED ORDER — LEVALBUTEROL HCL 0.63 MG/3ML IN NEBU
0.6300 mg | INHALATION_SOLUTION | Freq: Four times a day (QID) | RESPIRATORY_TRACT | Status: DC
  Administered 2011-07-14 – 2011-07-15 (×4): 0.63 mg via RESPIRATORY_TRACT
  Filled 2011-07-14 (×7): qty 3

## 2011-07-14 MED ORDER — TIOTROPIUM BROMIDE MONOHYDRATE 18 MCG IN CAPS
18.0000 ug | ORAL_CAPSULE | Freq: Every day | RESPIRATORY_TRACT | Status: DC
  Administered 2011-07-15: 18 ug via RESPIRATORY_TRACT
  Filled 2011-07-14: qty 5

## 2011-07-14 NOTE — Progress Notes (Signed)
Patient ambulated in hallway without oxygen.  Oxygen saturation at the beginning of ambulation 96%,  81% to 85% while ambulating, then 94% with Oxygen at 2L at the end.    Lethea Killings, RN

## 2011-07-14 NOTE — Progress Notes (Signed)
Subjective: Patient seen and examined this am. Feels her breathing to be better.  Objective:  Vital signs in last 24 hours:  Filed Vitals:   07/14/11 0848 07/14/11 1144 07/14/11 1208 07/14/11 1311  BP:  152/92  147/82  Pulse:  114  108  Temp:    98 F (36.7 C)  TempSrc:    Oral  Resp:    20  Height:      Weight:      SpO2: 96%  95% 94%    Intake/Output from previous day:   Intake/Output Summary (Last 24 hours) at 07/14/11 1454 Last data filed at 07/14/11 0500  Gross per 24 hour  Intake      0 ml  Output   1200 ml  Net  -1200 ml    Physical Exam: General:elderly thin built female in no acute distress.  HEENT: no pallor, no icterus, moist oral mucosa, no JVD, no lymphadenopathy. No mastoid tenderness  Heart: s1 &s2 Irregular, without murmurs, rubs, gallops.  Lungs: few scattered ronchi , better today  Abdomen: Soft, nontender, nondistended, positive bowel sounds.  Extremities: No clubbing cyanosis or edema with positive pedal pulses.  Neuro: Alert, awake, oriented x3, nonfocal.    Lab Results:  Basic Metabolic Panel:    Component Value Date/Time   NA 138 07/12/2011 0525   K 4.4 07/12/2011 0525   CL 102 07/12/2011 0525   CO2 32 07/12/2011 0525   BUN 11 07/12/2011 0525   CREATININE 0.57 07/12/2011 0525   GLUCOSE 146* 07/12/2011 0525   CALCIUM 8.7 07/12/2011 0525   CBC:    Component Value Date/Time   WBC 4.1 07/10/2011 0908   HGB 15.5* 07/10/2011 0908   HCT 45.9 07/10/2011 0908   PLT 152 07/10/2011 0908   MCV 87.4 07/10/2011 0908   NEUTROABS 6.0 02/25/2010 0552   LYMPHSABS 0.8 02/25/2010 0552   MONOABS 0.5 02/25/2010 0552   EOSABS 0.0 02/25/2010 0552   BASOSABS 0.0 02/25/2010 0552    No results found for this or any previous visit (from the past 240 hour(s)).  Studies/Results: No results found.  Medications: Scheduled Meds:   . cilostazol  100 mg Oral Daily  . dabigatran  150 mg Oral Q12H  . guaiFENesin  600 mg Oral BID  . hydrochlorothiazide  12.5 mg Oral Daily  .  levalbuterol  0.63 mg Nebulization QID  . levofloxacin (LEVAQUIN) IV  750 mg Intravenous Q48H  . methylPREDNISolone (SOLU-MEDROL) injection  80 mg Intravenous Q12H  . metoprolol tartrate  12.5 mg Oral BID  . nicotine  21 mg Transdermal Daily  . pantoprazole  40 mg Oral Daily  . tiotropium  18 mcg Inhalation Daily  . verapamil  240 mg Oral Daily  . DISCONTD: ipratropium  0.5 mg Nebulization Q4H  . DISCONTD: levalbuterol  0.63 mg Nebulization Q4H   Continuous Infusions:  PRN Meds:.acetaminophen, acetaminophen, levalbuterol, morphine, ondansetron (ZOFRAN) IV, ondansetron   Assessment  44 female with active tobacco use , known COPD, afib , HTN, hx of CVA, and PVD presented with progressive SOB with cough likely in the setting of COPD exacerbation.   Plan:  COPD Exacerbation  Continue IV solumedrol 80 mg bid  cont nebs, on xopenex as patient not able to tolerate albuterol , will increased frequency to q 4hr . She is very slowly responding to treatment and has been needing IV steroid as well as frequent nebs thus prolonging her stay  02 via [place] , desaturates on ambulation and will need to  reassess need for home o2 prior to discharge  Po levaquin  nicotine patch  counseled on smoking cessations   HTN  Cont HCTZ,   GERD  cont PPI   PVD  cont cilostazol   Afib with RVR  cont pradaxa  Cont verapamil. Still tachy on monitor . Added low dose metoprolol   Rt ear pain with mastoid tenderness  given persistent recurrent b/l ear pain symptoms with rt mastoid tenderness, planned on getting head CT which she says couldn't tolerate and got cancelled. denies any ear pain and no mastoid tenderness on exam   Hyperkalemia  resolved    Dispo: likely home tomorrow , with possibly home o2 and also needs nebs   LOS: 4 days   Caroline [name] 07/14/2011, 2:54 PM

## 2011-07-15 DIAGNOSIS — J449 Chronic obstructive pulmonary disease, unspecified: Secondary | ICD-10-CM | POA: Diagnosis not present

## 2011-07-15 DIAGNOSIS — F17200 Nicotine dependence, unspecified, uncomplicated: Secondary | ICD-10-CM | POA: Diagnosis not present

## 2011-07-15 DIAGNOSIS — I4891 Unspecified atrial fibrillation: Secondary | ICD-10-CM | POA: Diagnosis not present

## 2011-07-15 DIAGNOSIS — E876 Hypokalemia: Secondary | ICD-10-CM | POA: Diagnosis not present

## 2011-07-15 LAB — BLOOD GAS, ARTERIAL
Acid-Base Excess: 5 mmol/L — ABNORMAL HIGH (ref 0.0–2.0)
Drawn by: 35135
O2 Content: 2 L/min
O2 Saturation: 91.8 %
Patient temperature: 98.6
pCO2 arterial: 54.5 mmHg — ABNORMAL HIGH (ref 35.0–45.0)

## 2011-07-15 MED ORDER — PREDNISONE (PAK) 10 MG PO TABS: 10.0000 mg | ORAL_TABLET | Freq: Every day | ORAL | Status: AC

## 2011-07-15 MED ORDER — TIOTROPIUM BROMIDE MONOHYDRATE 18 MCG IN CAPS: 18.0000 ug | ORAL_CAPSULE | Freq: Every day | RESPIRATORY_TRACT | Status: DC

## 2011-07-15 MED ORDER — ESOMEPRAZOLE MAGNESIUM 40 MG PO CPDR: 40.0000 mg | DELAYED_RELEASE_CAPSULE | Freq: Every day | ORAL | Status: DC

## 2011-07-15 MED ORDER — LEVALBUTEROL HCL 0.63 MG/3ML IN NEBU: 0.6300 mg | INHALATION_SOLUTION | Freq: Four times a day (QID) | RESPIRATORY_TRACT | Status: DC | PRN

## 2011-07-15 MED ORDER — METOPROLOL TARTRATE 12.5 MG HALF TABLET: 12.5000 mg | ORAL_TABLET | Freq: Two times a day (BID) | ORAL | Status: DC

## 2011-07-15 MED ORDER — GUAIFENESIN ER 600 MG PO TB12: 600.0000 mg | ORAL_TABLET | Freq: Two times a day (BID) | ORAL | Status: DC

## 2011-07-15 MED ORDER — NICOTINE 21 MG/24HR TD PT24: MEDICATED_PATCH | TRANSDERMAL | Status: DC

## 2011-07-15 NOTE — Progress Notes (Signed)
Physical Therapy Cancel Patient Details Name: Caroline [name] MRN: [id number] DOB: [birth year] Today's Date: 07/15/2011  Pt currently discharging from the hospital.  Per RN pt has no further PT needs or questions.  Will continue to follow if does not d/c home today.  Thanks!!!  Ardath Lepak 07/15/2011, 3:05 PM [phone]

## 2011-07-15 NOTE — Progress Notes (Signed)
   CARE MANAGEMENT NOTE 07/15/2011  Patient:  Caroline [name], Caroline [name]   Account Number:  [id number]  Date Initiated:  07/10/2011  Documentation initiated by:  Donn Pierini  Subjective/Objective Assessment:   Pt admitted with COPD exacerbation     Action/Plan:   PTA pt lived at home with spouse, was independent with ADLs   Anticipated DC Date:  07/15/2011   Anticipated DC Plan:  HOME/SELF CARE      DC Planning Services  CM consult      PAC Choice  DURABLE MEDICAL EQUIPMENT   Choice offered to / List presented to:  C-1 Patient   DME arranged  [facility]  NEBULIZER MACHINE      DME agency  [facility]        Status of service:  Completed, signed off Medicare Important Message given?   (If response is "NO", the following Medicare IM given date fields will be blank) Date Medicare IM given:   Date Additional Medicare IM given:    Discharge Disposition:  HOME/SELF CARE  Per UR Regulation:    Comments:  07/15/11 14:27 Letha Cape RN, I received text message at 13:48 that patient needs home oxygen.  Then a call came in to [place] from the Director of [id number] that she needs to know what time the oxygen will be there from [facility], patient daughter was upset and did not want to wait for [place] [facility] to bring the oxygen, so  I asked if they would like to go with [facility] since they were onsight, she states yes.  Referral made to [place] with [facility],  for oxygen and nebulizer machine.  07/13/2011 1300 Call from Unit RN. Pt O2 sat dropping when ambulating, will need to evaluate close to d/c for home oxygen. Spoke to pt and daughter. Pt states she prefers [place] for oxygen. Will fax orders to [place] once orders received. Isidoro Donning RN CCM Case Mgmt phone [phone]  PCP- Sherwood Gambler

## 2011-07-15 NOTE — Progress Notes (Addendum)
Patient ambulated in hallway without oxygen.  Room air oxygen saturation 96%, during ambulation patient dropped 81% to 85% at room air, then applied oxygen 2L [place] while ambulating, oxygen saturation up to 94%.  Lethea Killings, RN

## 2011-07-15 NOTE — Discharge Summary (Signed)
Patient ID: Caroline [name] MRN: [id number] DOB/AGE: 1934/11/06 77 y.o.  Admit date: 07/10/2011 Discharge date: 07/15/2011  Primary Care Physician:  Cassell Smiles., MD, MD  Discharge Diagnoses:    Principal Problem:  *COPD with acute exacerbation  Active Problems:  TOBACCO ABUSE  FIBRILLATION, ATRIAL  PVD  EMPHYSEMA  Hypokalemia   Current Discharge Medication List    START taking these medications   Details       guaiFENesin (MUCINEX) 600 MG 12 hr tablet Take 1 tablet (600 mg total) by mouth 2 (two) times daily. Qty: 10 tablet, Refills: 0    levalbuterol (XOPENEX) 0.63 MG/3ML nebulizer solution Take 3 mLs (0.63 mg total) by nebulization every 6 (six) hours as needed for wheezing or shortness of breath. Qty: 60 mL, Refills: 5    metoprolol tartrate (LOPRESSOR) 12.5 mg TABS Take 0.5 tablets (12.5 mg total) by mouth 2 (two) times daily. Qty: 60 tablet, Refills: 0    nicotine (NICODERM CQ - DOSED IN MG/24 HOURS) 21 mg/24hr patch 21 mg Qty: 21 patch, Refills: 0    predniSONE (STERAPRED UNI-PAK) 10 MG tablet Take 1 tablet (10 mg total) by mouth daily. Take 50 mg daily for 3 days, then 40 mg daily for next 3 days, then 30 mg daily for next 3 days, then 20 mg daily for next 3 days then 10 gm daily for next 3 days then stop Qty: 45 tablet, Refills: 0    tiotropium (SPIRIVA) 18 MCG inhalation capsule Place 1 capsule (18 mcg total) into inhaler and inhale daily. Qty: 30 capsule, Refills: 0     !! - Potential duplicate medications found. Please discuss with provider.    CONTINUE these medications which have NOT CHANGED   Details  cilostazol (PLETAL) 100 MG tablet Take 100 mg by mouth daily.     dabigatran (PRADAXA) 150 MG CAPS Take 1 capsule (150 mg total) by mouth every 12 (twelve) hours. Qty: 60 capsule, Refills: 6    !! esomeprazole (NEXIUM) 40 MG capsule Take 1 capsule (40 mg total) by mouth daily before breakfast. Qty: 30 capsule, Refills: 11   Associated Diagnoses:  Abdominal discomfort    hydrochlorothiazide (HYDRODIURIL) 12.5 MG tablet Take 12.5 mg by mouth daily.      verapamil (CALAN-SR) 240 MG CR tablet Take 240 mg by mouth daily.       !! - Potential duplicate medications found. Please discuss with provider.      Disposition and Follow-up: Follow up with PCP in 1 week  will need follow up with outpatient pulmonologist  Consults:  none  Significant Diagnostic Studies:  Dg Chest Portable 1 View  07/10/2011  *RADIOLOGY REPORT*  Clinical Data: Shortness of breath  PORTABLE CHEST - 1 VIEW  Comparison: 06/22/2010  Findings: Shallow inspiration.  Heart size and pulmonary vascularity are normal.  Mild hyperinflation consistent with emphysematous change.  No focal airspace consolidation in the lungs.  No blunting of costophrenic angles.  No pneumothorax. Calcification of the aorta.  Old right rib fractures.  New healing left rib fractures since the previous study.  IMPRESSION: Emphysematous changes in the lungs.  No evidence of active pulmonary disease.  New healing left rib fractures.  Old right rib fractures.  Original Report Authenticated By: Marlon Pel, M.D.    Brief H and P: For complete details please refer to admission H and P, but in brief 77 yo with known COPD and Tobacco abuse here with 3 days of progressive SOB and cough. No Chest  pain, no orthopnea, no PND. Has been wheezing. Has tried her home therapy but no relief. Denies hemoptysis. Has felt a little better in the ER but still wheezing. Had some cold symptoms recently which might have triggered her attack.    Physical Exam on Discharge:  Filed Vitals:   07/15/11 0540 07/15/11 0807 07/15/11 1036 07/15/11 1215  BP: 167/90  176/96   Pulse: 82  82   Temp: 97.6 F (36.4 C)     TempSrc:      Resp: 20     Height:      Weight:      SpO2: 98% 98%  97%     Intake/Output Summary (Last 24 hours) at 07/15/11 1311 Last data filed at 07/15/11 0500  Gross per 24 hour  Intake      0  ml  Output   1100 ml  Net  -1100 ml    General:elderly thin built female in no acute distress.  HEENT: no pallor, no icterus, moist oral mucosa, no JVD, no lymphadenopathy. No mastoid tenderness  Heart: s1 &s2 Irregular, without murmurs, rubs, gallops.  Lungs: few scattered ronchi , better today  Abdomen: Soft, nontender, nondistended, positive bowel sounds.  Extremities: No clubbing cyanosis or edema with positive pedal pulses.  Neuro: Alert, awake, oriented x3, nonfocal.   CBC:    Component Value Date/Time   WBC 4.1 07/10/2011 0908   HGB 15.5* 07/10/2011 0908   HCT 45.9 07/10/2011 0908   PLT 152 07/10/2011 0908   MCV 87.4 07/10/2011 0908   NEUTROABS 6.0 02/25/2010 0552   LYMPHSABS 0.8 02/25/2010 0552   MONOABS 0.5 02/25/2010 0552   EOSABS 0.0 02/25/2010 0552   BASOSABS 0.0 02/25/2010 0552    Basic Metabolic Panel:    Component Value Date/Time   NA 138 07/12/2011 0525   K 4.4 07/12/2011 0525   CL 102 07/12/2011 0525   CO2 32 07/12/2011 0525   BUN 11 07/12/2011 0525   CREATININE 0.57 07/12/2011 0525   GLUCOSE 146* 07/12/2011 0525   CALCIUM 8.7 07/12/2011 0525    Hospital Course:  COPD Exacerbation  Patient admitted to tele and started on IV solumedrol , nebs and levofloxacin for COPD exacerbation with slow improvement. patient desaturates on ambulation and will need  home o2 prior to discharge  nicotine patch given while in hospital and strongly counseled on smoking cessation   Afib with RVR  cont pradaxa  Continued  verapamil. . Added low dose metoprolol as she was still tachy on monitor   Hyperkalemia  resolved  patient clinically stable and will ne discharged on prednisone taper, xopenex and atrovent nebs prn with spiriva. She will need to be on home o2 continuously 2L via [place] She needs a pulmonary evaluation as outpatient for management of her COPD and a PFT as well.   Time spent on Discharge: 45 minutes  Signed: Eddie North 07/15/2011, 1:11 PM

## 2011-07-22 ENCOUNTER — Encounter: Payer: Medicare Other | Admitting: Cardiology

## 2011-07-23 DIAGNOSIS — N390 Urinary tract infection, site not specified: Secondary | ICD-10-CM | POA: Diagnosis not present

## 2011-07-23 DIAGNOSIS — IMO0002 Reserved for concepts with insufficient information to code with codable children: Secondary | ICD-10-CM | POA: Diagnosis not present

## 2011-07-23 DIAGNOSIS — J449 Chronic obstructive pulmonary disease, unspecified: Secondary | ICD-10-CM | POA: Diagnosis not present

## 2011-07-23 DIAGNOSIS — E785 Hyperlipidemia, unspecified: Secondary | ICD-10-CM | POA: Diagnosis not present

## 2011-07-23 DIAGNOSIS — J209 Acute bronchitis, unspecified: Secondary | ICD-10-CM | POA: Diagnosis not present

## 2011-07-23 DIAGNOSIS — Z23 Encounter for immunization: Secondary | ICD-10-CM | POA: Diagnosis not present

## 2011-07-23 DIAGNOSIS — J438 Other emphysema: Secondary | ICD-10-CM | POA: Diagnosis not present

## 2011-08-01 ENCOUNTER — Encounter: Payer: Medicare Other | Admitting: Cardiology

## 2011-08-07 DIAGNOSIS — L708 Other acne: Secondary | ICD-10-CM | POA: Diagnosis not present

## 2011-08-07 DIAGNOSIS — Z85828 Personal history of other malignant neoplasm of skin: Secondary | ICD-10-CM | POA: Diagnosis not present

## 2011-08-07 DIAGNOSIS — L723 Sebaceous cyst: Secondary | ICD-10-CM | POA: Diagnosis not present

## 2011-08-19 ENCOUNTER — Other Ambulatory Visit: Payer: Self-pay | Admitting: Cardiology

## 2011-08-19 NOTE — Telephone Encounter (Signed)
..   Requested Prescriptions   Signed Prescriptions Disp Refills  . PRADAXA 150 MG CAPS 60 capsule 3    Sig: TAKE (1) CAPSULE BY MOUTH TWICE DAILY.    Authorizing Provider: Rollene Rotunda    Ordering User: Fidelia Cathers M  see med added on 07/20/2011 while in hospital

## 2011-08-26 DIAGNOSIS — H524 Presbyopia: Secondary | ICD-10-CM | POA: Diagnosis not present

## 2011-08-26 DIAGNOSIS — H52229 Regular astigmatism, unspecified eye: Secondary | ICD-10-CM | POA: Diagnosis not present

## 2011-08-26 DIAGNOSIS — H2589 Other age-related cataract: Secondary | ICD-10-CM | POA: Diagnosis not present

## 2011-08-26 DIAGNOSIS — H5200 Hypermetropia, unspecified eye: Secondary | ICD-10-CM | POA: Diagnosis not present

## 2011-12-17 ENCOUNTER — Other Ambulatory Visit: Payer: Self-pay | Admitting: Cardiology

## 2011-12-17 DIAGNOSIS — I714 Abdominal aortic aneurysm, without rupture: Secondary | ICD-10-CM

## 2011-12-20 ENCOUNTER — Encounter (INDEPENDENT_AMBULATORY_CARE_PROVIDER_SITE_OTHER): Payer: Medicare Other

## 2011-12-20 ENCOUNTER — Other Ambulatory Visit: Payer: Self-pay | Admitting: Cardiology

## 2011-12-20 DIAGNOSIS — I714 Abdominal aortic aneurysm, without rupture: Secondary | ICD-10-CM | POA: Diagnosis not present

## 2011-12-20 NOTE — Telephone Encounter (Signed)
..   Requested Prescriptions   Pending Prescriptions Disp Refills  . PRADAXA 150 MG CAPS [Pharmacy Med Name: PRADAXA 150MG  CAPSULE] 60 capsule 2    Sig: TAKE (1) CAPSULE BY MOUTH TWICE DAILY.  Patient needs to call office to make appointment

## 2011-12-25 DIAGNOSIS — M999 Biomechanical lesion, unspecified: Secondary | ICD-10-CM | POA: Diagnosis not present

## 2011-12-25 DIAGNOSIS — IMO0002 Reserved for concepts with insufficient information to code with codable children: Secondary | ICD-10-CM | POA: Diagnosis not present

## 2011-12-25 DIAGNOSIS — M5137 Other intervertebral disc degeneration, lumbosacral region: Secondary | ICD-10-CM | POA: Diagnosis not present

## 2011-12-26 ENCOUNTER — Other Ambulatory Visit: Payer: Self-pay | Admitting: *Deleted

## 2011-12-26 DIAGNOSIS — I6529 Occlusion and stenosis of unspecified carotid artery: Secondary | ICD-10-CM

## 2011-12-27 ENCOUNTER — Encounter (INDEPENDENT_AMBULATORY_CARE_PROVIDER_SITE_OTHER): Payer: Medicare Other

## 2011-12-27 DIAGNOSIS — M5137 Other intervertebral disc degeneration, lumbosacral region: Secondary | ICD-10-CM | POA: Diagnosis not present

## 2011-12-27 DIAGNOSIS — I6529 Occlusion and stenosis of unspecified carotid artery: Secondary | ICD-10-CM

## 2011-12-27 DIAGNOSIS — M999 Biomechanical lesion, unspecified: Secondary | ICD-10-CM | POA: Diagnosis not present

## 2011-12-27 DIAGNOSIS — IMO0002 Reserved for concepts with insufficient information to code with codable children: Secondary | ICD-10-CM | POA: Diagnosis not present

## 2011-12-30 DIAGNOSIS — M999 Biomechanical lesion, unspecified: Secondary | ICD-10-CM | POA: Diagnosis not present

## 2011-12-30 DIAGNOSIS — IMO0002 Reserved for concepts with insufficient information to code with codable children: Secondary | ICD-10-CM | POA: Diagnosis not present

## 2011-12-30 DIAGNOSIS — M5137 Other intervertebral disc degeneration, lumbosacral region: Secondary | ICD-10-CM | POA: Diagnosis not present

## 2012-01-01 DIAGNOSIS — M5137 Other intervertebral disc degeneration, lumbosacral region: Secondary | ICD-10-CM | POA: Diagnosis not present

## 2012-01-01 DIAGNOSIS — M999 Biomechanical lesion, unspecified: Secondary | ICD-10-CM | POA: Diagnosis not present

## 2012-01-01 DIAGNOSIS — IMO0002 Reserved for concepts with insufficient information to code with codable children: Secondary | ICD-10-CM | POA: Diagnosis not present

## 2012-01-03 ENCOUNTER — Other Ambulatory Visit: Payer: Self-pay | Admitting: *Deleted

## 2012-01-03 DIAGNOSIS — M999 Biomechanical lesion, unspecified: Secondary | ICD-10-CM | POA: Diagnosis not present

## 2012-01-03 DIAGNOSIS — I6529 Occlusion and stenosis of unspecified carotid artery: Secondary | ICD-10-CM

## 2012-01-03 DIAGNOSIS — IMO0002 Reserved for concepts with insufficient information to code with codable children: Secondary | ICD-10-CM | POA: Diagnosis not present

## 2012-01-03 DIAGNOSIS — M5137 Other intervertebral disc degeneration, lumbosacral region: Secondary | ICD-10-CM | POA: Diagnosis not present

## 2012-01-06 DIAGNOSIS — M999 Biomechanical lesion, unspecified: Secondary | ICD-10-CM | POA: Diagnosis not present

## 2012-01-06 DIAGNOSIS — IMO0002 Reserved for concepts with insufficient information to code with codable children: Secondary | ICD-10-CM | POA: Diagnosis not present

## 2012-01-06 DIAGNOSIS — M5137 Other intervertebral disc degeneration, lumbosacral region: Secondary | ICD-10-CM | POA: Diagnosis not present

## 2012-01-07 DIAGNOSIS — IMO0002 Reserved for concepts with insufficient information to code with codable children: Secondary | ICD-10-CM | POA: Diagnosis not present

## 2012-01-07 DIAGNOSIS — M5137 Other intervertebral disc degeneration, lumbosacral region: Secondary | ICD-10-CM | POA: Diagnosis not present

## 2012-01-07 DIAGNOSIS — M999 Biomechanical lesion, unspecified: Secondary | ICD-10-CM | POA: Diagnosis not present

## 2012-01-08 DIAGNOSIS — M999 Biomechanical lesion, unspecified: Secondary | ICD-10-CM | POA: Diagnosis not present

## 2012-01-08 DIAGNOSIS — IMO0002 Reserved for concepts with insufficient information to code with codable children: Secondary | ICD-10-CM | POA: Diagnosis not present

## 2012-01-08 DIAGNOSIS — M5137 Other intervertebral disc degeneration, lumbosacral region: Secondary | ICD-10-CM | POA: Diagnosis not present

## 2012-01-15 DIAGNOSIS — M999 Biomechanical lesion, unspecified: Secondary | ICD-10-CM | POA: Diagnosis not present

## 2012-01-15 DIAGNOSIS — IMO0002 Reserved for concepts with insufficient information to code with codable children: Secondary | ICD-10-CM | POA: Diagnosis not present

## 2012-01-15 DIAGNOSIS — M5137 Other intervertebral disc degeneration, lumbosacral region: Secondary | ICD-10-CM | POA: Diagnosis not present

## 2012-01-22 DIAGNOSIS — M999 Biomechanical lesion, unspecified: Secondary | ICD-10-CM | POA: Diagnosis not present

## 2012-01-22 DIAGNOSIS — IMO0002 Reserved for concepts with insufficient information to code with codable children: Secondary | ICD-10-CM | POA: Diagnosis not present

## 2012-01-22 DIAGNOSIS — M5137 Other intervertebral disc degeneration, lumbosacral region: Secondary | ICD-10-CM | POA: Diagnosis not present

## 2012-02-19 DIAGNOSIS — M545 Low back pain: Secondary | ICD-10-CM | POA: Diagnosis not present

## 2012-02-19 DIAGNOSIS — M999 Biomechanical lesion, unspecified: Secondary | ICD-10-CM | POA: Diagnosis not present

## 2012-02-19 DIAGNOSIS — M5137 Other intervertebral disc degeneration, lumbosacral region: Secondary | ICD-10-CM | POA: Diagnosis not present

## 2012-03-10 DIAGNOSIS — J019 Acute sinusitis, unspecified: Secondary | ICD-10-CM | POA: Diagnosis not present

## 2012-03-10 DIAGNOSIS — K219 Gastro-esophageal reflux disease without esophagitis: Secondary | ICD-10-CM | POA: Diagnosis not present

## 2012-03-10 DIAGNOSIS — J449 Chronic obstructive pulmonary disease, unspecified: Secondary | ICD-10-CM | POA: Diagnosis not present

## 2012-03-10 DIAGNOSIS — I10 Essential (primary) hypertension: Secondary | ICD-10-CM | POA: Diagnosis not present

## 2012-03-10 DIAGNOSIS — IMO0002 Reserved for concepts with insufficient information to code with codable children: Secondary | ICD-10-CM | POA: Diagnosis not present

## 2012-03-18 DIAGNOSIS — M5137 Other intervertebral disc degeneration, lumbosacral region: Secondary | ICD-10-CM | POA: Diagnosis not present

## 2012-03-18 DIAGNOSIS — M545 Low back pain: Secondary | ICD-10-CM | POA: Diagnosis not present

## 2012-03-18 DIAGNOSIS — M999 Biomechanical lesion, unspecified: Secondary | ICD-10-CM | POA: Diagnosis not present

## 2012-03-24 ENCOUNTER — Telehealth: Payer: Self-pay | Admitting: Cardiology

## 2012-03-24 MED ORDER — DABIGATRAN ETEXILATE MESYLATE 150 MG PO CAPS: 150.0000 mg | ORAL_CAPSULE | Freq: Two times a day (BID) | ORAL | Status: DC

## 2012-03-24 NOTE — Telephone Encounter (Signed)
..   Requested Prescriptions   Signed Prescriptions Disp Refills  . dabigatran (PRADAXA) 150 MG CAPS 60 capsule 2    Sig: Take 1 capsule (150 mg total) by mouth every 12 (twelve) hours.    Authorizing Provider: Rollene Rotunda    Ordering User: Sayan Aldava M  .Patient needs to contact office to schedule  Appointment  for future refills.Ph:[phone]. Thank you. Second request for appointment, This will be the last refill.

## 2012-03-25 DIAGNOSIS — M545 Low back pain: Secondary | ICD-10-CM | POA: Diagnosis not present

## 2012-03-25 DIAGNOSIS — M5137 Other intervertebral disc degeneration, lumbosacral region: Secondary | ICD-10-CM | POA: Diagnosis not present

## 2012-03-25 DIAGNOSIS — M999 Biomechanical lesion, unspecified: Secondary | ICD-10-CM | POA: Diagnosis not present

## 2012-03-28 DIAGNOSIS — J449 Chronic obstructive pulmonary disease, unspecified: Secondary | ICD-10-CM | POA: Diagnosis not present

## 2012-03-28 DIAGNOSIS — IMO0002 Reserved for concepts with insufficient information to code with codable children: Secondary | ICD-10-CM | POA: Diagnosis not present

## 2012-03-28 DIAGNOSIS — J209 Acute bronchitis, unspecified: Secondary | ICD-10-CM | POA: Diagnosis not present

## 2012-03-28 DIAGNOSIS — I10 Essential (primary) hypertension: Secondary | ICD-10-CM | POA: Diagnosis not present

## 2012-04-23 ENCOUNTER — Encounter (HOSPITAL_COMMUNITY): Payer: Self-pay | Admitting: *Deleted

## 2012-04-23 ENCOUNTER — Emergency Department (HOSPITAL_COMMUNITY)
Admission: EM | Admit: 2012-04-23 | Discharge: 2012-04-24 | Disposition: A | Discharge status: Home / Self Care | Payer: Medicare Other | Attending: Emergency Medicine | Admitting: Emergency Medicine

## 2012-04-23 ENCOUNTER — Emergency Department (HOSPITAL_COMMUNITY): Payer: Medicare Other

## 2012-04-23 DIAGNOSIS — R5381 Other malaise: Secondary | ICD-10-CM | POA: Diagnosis not present

## 2012-04-23 DIAGNOSIS — F17200 Nicotine dependence, unspecified, uncomplicated: Secondary | ICD-10-CM | POA: Insufficient documentation

## 2012-04-23 DIAGNOSIS — R5383 Other fatigue: Secondary | ICD-10-CM | POA: Diagnosis not present

## 2012-04-23 DIAGNOSIS — J449 Chronic obstructive pulmonary disease, unspecified: Secondary | ICD-10-CM | POA: Insufficient documentation

## 2012-04-23 DIAGNOSIS — K219 Gastro-esophageal reflux disease without esophagitis: Secondary | ICD-10-CM | POA: Diagnosis not present

## 2012-04-23 DIAGNOSIS — I739 Peripheral vascular disease, unspecified: Secondary | ICD-10-CM | POA: Diagnosis not present

## 2012-04-23 DIAGNOSIS — I700 Atherosclerosis of aorta: Secondary | ICD-10-CM | POA: Diagnosis not present

## 2012-04-23 DIAGNOSIS — E876 Hypokalemia: Secondary | ICD-10-CM | POA: Insufficient documentation

## 2012-04-23 DIAGNOSIS — J4489 Other specified chronic obstructive pulmonary disease: Secondary | ICD-10-CM | POA: Insufficient documentation

## 2012-04-23 DIAGNOSIS — I10 Essential (primary) hypertension: Secondary | ICD-10-CM | POA: Insufficient documentation

## 2012-04-23 DIAGNOSIS — R6889 Other general symptoms and signs: Secondary | ICD-10-CM | POA: Diagnosis not present

## 2012-04-23 DIAGNOSIS — Z8673 Personal history of transient ischemic attack (TIA), and cerebral infarction without residual deficits: Secondary | ICD-10-CM | POA: Diagnosis not present

## 2012-04-23 DIAGNOSIS — R531 Weakness: Secondary | ICD-10-CM

## 2012-04-23 DIAGNOSIS — R404 Transient alteration of awareness: Secondary | ICD-10-CM | POA: Diagnosis not present

## 2012-04-23 LAB — CBC WITH DIFFERENTIAL/PLATELET
Basophils Absolute: 0 10*3/uL (ref 0.0–0.1)
Basophils Relative: 0 % (ref 0–1)
Eosinophils Absolute: 0.1 10*3/uL (ref 0.0–0.7)
Eosinophils Relative: 2 % (ref 0–5)
HCT: 47.9 % — ABNORMAL HIGH (ref 36.0–46.0)
MCH: 30.1 pg (ref 26.0–34.0)
MCHC: 35.3 g/dL (ref 30.0–36.0)
MCV: 85.4 fL (ref 78.0–100.0)
Monocytes Absolute: 0.8 10*3/uL (ref 0.1–1.0)
Platelets: 164 10*3/uL (ref 150–400)
RDW: 14.6 % (ref 11.5–15.5)
WBC: 8.8 10*3/uL (ref 4.0–10.5)

## 2012-04-23 MED ORDER — SODIUM CHLORIDE 0.9 % IV BOLUS (SEPSIS)
500.0000 mL | Freq: Once | INTRAVENOUS | Status: AC
Start: 2012-04-23 — End: 2012-04-24
  Administered 2012-04-23: 500 mL via INTRAVENOUS

## 2012-04-23 MED ORDER — SODIUM CHLORIDE 0.9 % IV SOLN: INTRAVENOUS | Status: DC

## 2012-04-23 NOTE — ED Notes (Addendum)
Pt to department via EMS.  Reports general fatigue and weakness for "a couple weeks".  Denies known fever.  Denies pain, nausea or vomiting.  EMS checked blood sugar, results 91.

## 2012-04-23 NOTE — ED Provider Notes (Signed)
History     CSN: [id number]  Arrival date & time 04/23/12  2206   First MD Initiated Contact with Patient 04/23/12 2305      Chief Complaint  Patient presents with  . Fatigue    (Consider location/radiation/quality/duration/timing/severity/associated sxs/prior treatment) HPI Comments: Caroline [name] is a 77 y.o. Female  Who is here for weakness, and hot flashes. She's had similar episodes in the past. She has not taken her temperature. Her weaknesses, is global. There is no focal weakness. She has chronic cough that is unchanged. She has chronic shortness of breath that is unchanged. She continues to smoke. She denies chest pain. There is no headache, or vision, paresthesia. There are no modifying factors.  The history is provided by the patient.    Past Medical History  Diagnosis Date  . Hypertension   . GERD (gastroesophageal reflux disease)   . Cerebrovascular disease   . Peripheral vascular disease     40-59% left carotid stenosis, 0-39% right  . PAD (peripheral artery disease)     lower extremity PAD with bilateral leg stenting  . TIA (transient ischemic attack)   . COPD (chronic obstructive pulmonary disease)     Past Surgical History  Procedure Date  . Appendectomy   . Partial hysterectomy   . Gallbladder surgery   . Cholecystectomy     History reviewed. No pertinent family history.  History  Substance Use Topics  . Smoking status: Current Every Day Smoker -- 1.0 packs/day  . Smokeless tobacco: Not on file  . Alcohol Use: No    OB History    Grav Para Term Preterm Abortions TAB SAB Ect Mult Living                  Review of Systems  All other systems reviewed and are negative.    Allergies  Motrin; Albuterol; Amoxicillin; Cephalexin; Codeine; Entex pac; Etanercept; Fluticasone-salmeterol; Gualenic acid; Lorazepam; Nubain; Oxycodone hcl; and Oxycodone-acetaminophen  Home Medications   Current Outpatient Rx  Name Route Sig Dispense Refill  .  CILOSTAZOL 100 MG PO TABS Oral Take 50 mg by mouth 2 (two) times daily.     Marland Kitchen DABIGATRAN ETEXILATE MESYLATE 150 MG PO CAPS Oral Take 1 capsule (150 mg total) by mouth every 12 (twelve) hours. 60 capsule 2    .Marland KitchenPatient needs to contact office to schedule  App ...  . HYDROCHLOROTHIAZIDE 12.5 MG PO TABS Oral Take 12.5 mg by mouth every morning.     . THEOPHYLLINE ER 200 MG PO TB12 Oral Take 200 mg by mouth daily.    Marland Kitchen VERAPAMIL HCL 240 MG PO TBCR Oral Take 240 mg by mouth daily. At lunch    . AZITHROMYCIN 200 MG/5ML PO SUSR Oral Take 5 mLs (200 mg total) by mouth daily. 22.5 mL 0  . POTASSIUM CHLORIDE CRYS ER 20 MEQ PO TBCR Oral Take 1 tablet (20 mEq total) by mouth 2 (two) times daily. 10 tablet 0  . TIOTROPIUM BROMIDE MONOHYDRATE 18 MCG IN CAPS Inhalation Place 1 capsule (18 mcg total) into inhaler and inhale daily. 30 capsule 0    BP 118/60  Pulse 90  Temp 98.3 F (36.8 C) (Oral)  Resp 21  Ht 5\' 4"  (1.626 m)  Wt 117 lb (53.071 kg)  BMI 20.08 kg/m2  SpO2 89%  Physical Exam  Nursing note and vitals reviewed. Constitutional: She is oriented to person, place, and time. She appears well-developed and well-nourished.       Appears  older than stated age. Frail  HENT:  Head: Normocephalic and atraumatic.  Eyes: Conjunctivae normal and EOM are normal. Pupils are equal, round, and reactive to light.  Neck: Normal range of motion and phonation normal. Neck supple.  Cardiovascular: Normal rate, regular rhythm and intact distal pulses.   Pulmonary/Chest: Effort normal. No respiratory distress. She has wheezes (Scattered). She exhibits no tenderness.       Bilateral rhonchi  Abdominal: Soft. She exhibits no distension. There is no tenderness. There is no guarding.  Musculoskeletal: Normal range of motion. She exhibits no edema and no tenderness.  Neurological: She is alert and oriented to person, place, and time. She has normal strength. She exhibits normal muscle tone.  Skin: Skin is warm and  dry.  Psychiatric: She has a normal mood and affect. Her behavior is normal. Judgment and thought content normal.    ED Course  Procedures (including critical care time)    Date: 04/24/2012  Rate: 92  Rhythm: atrial fibrillation  QRS Axis: normal  Intervals: Normal QT  ST/T Wave abnormalities: normal  Conduction Disutrbances:Atrial Fib.  Narrative Interpretation:   Old EKG Reviewed: changes noted- rate slower      Labs Reviewed  CBC WITH DIFFERENTIAL - Abnormal; Notable for the following:    RBC 5.61 (*)     Hemoglobin 16.9 (*)     HCT 47.9 (*)     All other components within normal limits  BASIC METABOLIC PANEL - Abnormal; Notable for the following:    Potassium 2.9 (*)     CO2 33 (*)     Glucose, Bld 102 (*)     GFR calc non Af Amer 85 (*)     All other components within normal limits  URINALYSIS, ROUTINE W REFLEX MICROSCOPIC - Abnormal; Notable for the following:    Hgb urine dipstick TRACE (*)     All other components within normal limits  TROPONIN I  D-DIMER, QUANTITATIVE  URINE MICROSCOPIC-ADD ON  URINE CULTURE   Dg Chest 2 View  04/24/2012  *RADIOLOGY REPORT*  Clinical Data: Cough.  Weakness.  COPD.  CHEST - 2 VIEW  Comparison: 07/10/2011  Findings: Hyperinflation/COPD.  Mild osteopenia.  Aortic atherosclerosis.  Remote left rib fracture. Midline trachea.  Normal heart size. Aortic atherosclerosis No pleural effusion or pneumothorax.  Lower lobe predominant interstitial thickening.  This is asymmetric, and greater on the left than right.  There may be mild concurrent left base scarring.  No correlate consolidation on the lateral view. This appearance is quite similar to the exam of 02/23/2010.  IMPRESSION: COPD/chronic bronchitis with probable scarring at the left lung base. No acute superimposed process.   Original Report Authenticated By: Consuello Bossier, M.D.      1. Weakness   2. Hypokalemia       MDM  Nonspecific weakness, with hypokalemia. Doubt  metabolic instability, serious bacterial infection or impending vascular collapse; the patient is stable for discharge.   Plan: Home Medications- Potassium for 5 days; Home Treatments- rest, fluids, good nutrition; Recommended follow up- PCP for checkup in 5 days         Flint Melter, MD 04/24/12 [phone]

## 2012-04-24 DIAGNOSIS — I700 Atherosclerosis of aorta: Secondary | ICD-10-CM | POA: Diagnosis not present

## 2012-04-24 LAB — URINALYSIS, ROUTINE W REFLEX MICROSCOPIC
Glucose, UA: NEGATIVE mg/dL
Ketones, ur: NEGATIVE mg/dL
Leukocytes, UA: NEGATIVE
Protein, ur: NEGATIVE mg/dL
Urobilinogen, UA: 0.2 mg/dL (ref 0.0–1.0)

## 2012-04-24 LAB — URINE MICROSCOPIC-ADD ON

## 2012-04-24 LAB — BASIC METABOLIC PANEL
Calcium: 8.8 mg/dL (ref 8.4–10.5)
Creatinine, Ser: 0.65 mg/dL (ref 0.50–1.10)
GFR calc non Af Amer: 85 mL/min — ABNORMAL LOW (ref >90)
Sodium: 138 mEq/L (ref 135–145)

## 2012-04-24 LAB — TROPONIN I: Troponin I: <0.3 ng/mL (ref <0.30)

## 2012-04-24 MED ORDER — POTASSIUM CHLORIDE CRYS ER 20 MEQ PO TBCR: 20.0000 meq | EXTENDED_RELEASE_TABLET | Freq: Two times a day (BID) | ORAL | Status: DC

## 2012-04-24 MED ORDER — AZITHROMYCIN 200 MG/5ML PO SUSR: 200.0000 mg | Freq: Every day | ORAL | Status: DC

## 2012-04-24 MED ORDER — POTASSIUM CHLORIDE CRYS ER 20 MEQ PO TBCR
40.0000 meq | EXTENDED_RELEASE_TABLET | Freq: Once | ORAL | Status: AC
  Administered 2012-04-24: 40 meq via ORAL
  Filled 2012-04-24: qty 2

## 2012-04-24 NOTE — ED Notes (Signed)
Ambulated to bathroom and back to room with minimal assistance.  Daughter states this is how she usually walks

## 2012-04-24 NOTE — ED Notes (Signed)
Patient did not receive Rx for Zithromax.  [id number]

## 2012-04-25 LAB — URINE CULTURE
Colony Count: NO GROWTH
Culture: NO GROWTH

## 2012-04-29 DIAGNOSIS — J449 Chronic obstructive pulmonary disease, unspecified: Secondary | ICD-10-CM | POA: Diagnosis not present

## 2012-04-29 DIAGNOSIS — D510 Vitamin B12 deficiency anemia due to intrinsic factor deficiency: Secondary | ICD-10-CM | POA: Diagnosis not present

## 2012-04-29 DIAGNOSIS — IMO0002 Reserved for concepts with insufficient information to code with codable children: Secondary | ICD-10-CM | POA: Diagnosis not present

## 2012-04-29 DIAGNOSIS — E876 Hypokalemia: Secondary | ICD-10-CM | POA: Diagnosis not present

## 2012-04-29 DIAGNOSIS — R5383 Other fatigue: Secondary | ICD-10-CM | POA: Diagnosis not present

## 2012-05-02 ENCOUNTER — Observation Stay (HOSPITAL_COMMUNITY): Payer: Medicare Other

## 2012-05-02 ENCOUNTER — Emergency Department (HOSPITAL_COMMUNITY): Payer: Medicare Other

## 2012-05-02 ENCOUNTER — Encounter (HOSPITAL_COMMUNITY): Payer: Self-pay | Admitting: *Deleted

## 2012-05-02 ENCOUNTER — Inpatient Hospital Stay (HOSPITAL_COMMUNITY)
Admission: AD | Admit: 2012-05-02 | Discharge: 2012-05-12 | DRG: 039 | Disposition: A | Discharge status: Home Health | Payer: Medicare Other | Attending: Internal Medicine | Admitting: Internal Medicine

## 2012-05-02 DIAGNOSIS — M1990 Unspecified osteoarthritis, unspecified site: Secondary | ICD-10-CM | POA: Diagnosis present

## 2012-05-02 DIAGNOSIS — F17200 Nicotine dependence, unspecified, uncomplicated: Secondary | ICD-10-CM

## 2012-05-02 DIAGNOSIS — J449 Chronic obstructive pulmonary disease, unspecified: Secondary | ICD-10-CM | POA: Diagnosis not present

## 2012-05-02 DIAGNOSIS — J4489 Other specified chronic obstructive pulmonary disease: Secondary | ICD-10-CM | POA: Diagnosis present

## 2012-05-02 DIAGNOSIS — I6789 Other cerebrovascular disease: Secondary | ICD-10-CM | POA: Diagnosis not present

## 2012-05-02 DIAGNOSIS — G459 Transient cerebral ischemic attack, unspecified: Secondary | ICD-10-CM

## 2012-05-02 DIAGNOSIS — M545 Low back pain, unspecified: Secondary | ICD-10-CM

## 2012-05-02 DIAGNOSIS — Z8673 Personal history of transient ischemic attack (TIA), and cerebral infarction without residual deficits: Secondary | ICD-10-CM

## 2012-05-02 DIAGNOSIS — I6340 Cerebral infarction due to embolism of unspecified cerebral artery: Principal | ICD-10-CM | POA: Diagnosis present

## 2012-05-02 DIAGNOSIS — I6350 Cerebral infarction due to unspecified occlusion or stenosis of unspecified cerebral artery: Secondary | ICD-10-CM

## 2012-05-02 DIAGNOSIS — E876 Hypokalemia: Secondary | ICD-10-CM

## 2012-05-02 DIAGNOSIS — I639 Cerebral infarction, unspecified: Secondary | ICD-10-CM

## 2012-05-02 DIAGNOSIS — R220 Localized swelling, mass and lump, head: Secondary | ICD-10-CM | POA: Diagnosis not present

## 2012-05-02 DIAGNOSIS — I4891 Unspecified atrial fibrillation: Secondary | ICD-10-CM

## 2012-05-02 DIAGNOSIS — Z79899 Other long term (current) drug therapy: Secondary | ICD-10-CM

## 2012-05-02 DIAGNOSIS — I6529 Occlusion and stenosis of unspecified carotid artery: Secondary | ICD-10-CM

## 2012-05-02 DIAGNOSIS — J438 Other emphysema: Secondary | ICD-10-CM | POA: Diagnosis not present

## 2012-05-02 DIAGNOSIS — R627 Adult failure to thrive: Secondary | ICD-10-CM | POA: Diagnosis present

## 2012-05-02 DIAGNOSIS — R29898 Other symptoms and signs involving the musculoskeletal system: Secondary | ICD-10-CM | POA: Diagnosis not present

## 2012-05-02 DIAGNOSIS — I959 Hypotension, unspecified: Secondary | ICD-10-CM

## 2012-05-02 DIAGNOSIS — F411 Generalized anxiety disorder: Secondary | ICD-10-CM | POA: Diagnosis present

## 2012-05-02 DIAGNOSIS — R5381 Other malaise: Secondary | ICD-10-CM | POA: Diagnosis present

## 2012-05-02 DIAGNOSIS — R259 Unspecified abnormal involuntary movements: Secondary | ICD-10-CM | POA: Diagnosis not present

## 2012-05-02 DIAGNOSIS — G255 Other chorea: Secondary | ICD-10-CM

## 2012-05-02 DIAGNOSIS — R221 Localized swelling, mass and lump, neck: Secondary | ICD-10-CM | POA: Diagnosis not present

## 2012-05-02 DIAGNOSIS — K219 Gastro-esophageal reflux disease without esophagitis: Secondary | ICD-10-CM | POA: Diagnosis present

## 2012-05-02 DIAGNOSIS — I739 Peripheral vascular disease, unspecified: Secondary | ICD-10-CM

## 2012-05-02 DIAGNOSIS — I10 Essential (primary) hypertension: Secondary | ICD-10-CM | POA: Diagnosis present

## 2012-05-02 DIAGNOSIS — Z23 Encounter for immunization: Secondary | ICD-10-CM

## 2012-05-02 DIAGNOSIS — Z66 Do not resuscitate: Secondary | ICD-10-CM | POA: Diagnosis present

## 2012-05-02 HISTORY — DX: Occlusion and stenosis of unspecified carotid artery: I65.29

## 2012-05-02 HISTORY — DX: Cerebral infarction due to unspecified occlusion or stenosis of unspecified cerebral artery: I63.50

## 2012-05-02 LAB — CBC WITH DIFFERENTIAL/PLATELET
Hemoglobin: 17.1 g/dL — ABNORMAL HIGH (ref 12.0–15.0)
Lymphocytes Relative: 27 % (ref 12–46)
Lymphs Abs: 2.2 10*3/uL (ref 0.7–4.0)
MCV: 86.3 fL (ref 78.0–100.0)
Neutrophils Relative %: 62 % (ref 43–77)
Platelets: 179 10*3/uL (ref 150–400)
RBC: 5.69 MIL/uL — ABNORMAL HIGH (ref 3.87–5.11)
WBC: 8 10*3/uL (ref 4.0–10.5)

## 2012-05-02 LAB — COMPREHENSIVE METABOLIC PANEL
ALT: 17 U/L (ref 0–35)
Alkaline Phosphatase: 85 U/L (ref 39–117)
CO2: 35 mEq/L — ABNORMAL HIGH (ref 19–32)
GFR calc Af Amer: 79 mL/min — ABNORMAL LOW (ref >90)
GFR calc non Af Amer: 68 mL/min — ABNORMAL LOW (ref >90)
Glucose, Bld: 83 mg/dL (ref 70–99)
Potassium: 3.4 mEq/L — ABNORMAL LOW (ref 3.5–5.1)
Sodium: 135 mEq/L (ref 135–145)
Total Bilirubin: 0.3 mg/dL (ref 0.3–1.2)

## 2012-05-02 MED ORDER — ALPRAZOLAM 0.25 MG PO TABS: 0.2500 mg | ORAL_TABLET | Freq: Every evening | ORAL | Status: DC | PRN

## 2012-05-02 MED ORDER — ATORVASTATIN CALCIUM 40 MG PO TABS
40.0000 mg | ORAL_TABLET | Freq: Every day | ORAL | Status: DC
  Administered 2012-05-03: 40 mg via ORAL
  Filled 2012-05-02: qty 1

## 2012-05-02 MED ORDER — ACETAMINOPHEN 325 MG PO TABS: 650.0000 mg | ORAL_TABLET | ORAL | Status: DC | PRN

## 2012-05-02 MED ORDER — SENNOSIDES-DOCUSATE SODIUM 8.6-50 MG PO TABS
1.0000 | ORAL_TABLET | Freq: Every evening | ORAL | Status: DC | PRN
  Filled 2012-05-02: qty 1

## 2012-05-02 MED ORDER — TRIAMTERENE-HCTZ 37.5-25 MG PO TABS
1.0000 | ORAL_TABLET | Freq: Every day | ORAL | Status: DC
  Administered 2012-05-03: 1 via ORAL
  Filled 2012-05-02: qty 1

## 2012-05-02 MED ORDER — TIOTROPIUM BROMIDE MONOHYDRATE 18 MCG IN CAPS
18.0000 ug | ORAL_CAPSULE | Freq: Every day | RESPIRATORY_TRACT | Status: DC
  Administered 2012-05-03 – 2012-05-13 (×7): 18 ug via RESPIRATORY_TRACT
  Filled 2012-05-02 (×3): qty 5

## 2012-05-02 MED ORDER — ONDANSETRON HCL 4 MG/2ML IJ SOLN: 4.0000 mg | Freq: Four times a day (QID) | INTRAMUSCULAR | Status: DC | PRN

## 2012-05-02 MED ORDER — CILOSTAZOL 100 MG PO TABS
50.0000 mg | ORAL_TABLET | Freq: Two times a day (BID) | ORAL | Status: DC
  Administered 2012-05-03 (×3): 50 mg via ORAL
  Filled 2012-05-02 (×4): qty 1

## 2012-05-02 MED ORDER — VERAPAMIL HCL ER 240 MG PO TBCR
240.0000 mg | EXTENDED_RELEASE_TABLET | Freq: Every day | ORAL | Status: DC
  Administered 2012-05-03 – 2012-05-10 (×8): 240 mg via ORAL
  Filled 2012-05-02 (×11): qty 1

## 2012-05-02 MED ORDER — SODIUM CHLORIDE 0.9 % IJ SOLN: 3.0000 mL | INTRAMUSCULAR | Status: DC | PRN

## 2012-05-02 MED ORDER — SODIUM CHLORIDE 0.9 % IV SOLN: 250.0000 mL | INTRAVENOUS | Status: DC | PRN

## 2012-05-02 MED ORDER — SODIUM CHLORIDE 0.9 % IJ SOLN
3.0000 mL | Freq: Two times a day (BID) | INTRAMUSCULAR | Status: DC
  Administered 2012-05-03: 3 mL via INTRAVENOUS
  Administered 2012-05-03: 10 mL via INTRAVENOUS
  Administered 2012-05-03 – 2012-05-13 (×8): 3 mL via INTRAVENOUS
  Filled 2012-05-02: qty 3

## 2012-05-02 MED ORDER — DABIGATRAN ETEXILATE MESYLATE 150 MG PO CAPS
150.0000 mg | ORAL_CAPSULE | Freq: Two times a day (BID) | ORAL | Status: DC
  Administered 2012-05-03 (×3): 150 mg via ORAL
  Filled 2012-05-02 (×8): qty 1

## 2012-05-02 MED ORDER — ACETAMINOPHEN 650 MG RE SUPP: 650.0000 mg | RECTAL | Status: DC | PRN

## 2012-05-02 NOTE — ED Notes (Signed)
Called out for stroke. Weakness in right arm. Patient seen on 17th for same. Family thought she was having a stroke due to right arm weak and shaking

## 2012-05-02 NOTE — ED Provider Notes (Signed)
History     CSN: [id number]  Arrival date & time 05/02/12  1907   First MD Initiated Contact with Patient 05/02/12 1910      Chief Complaint  Patient presents with  . Extremity Weakness    (Consider location/radiation/quality/duration/timing/severity/associated sxs/prior treatment) Patient is a 77 y.o. female presenting with extremity weakness. The history is provided by the patient (the pt states she had some facial drooping around 2pm and went to lie down.  when she got up she noticed her right arm was weak). No language interpreter was used.  Extremity Weakness This is a new problem. The current episode started 6 to 12 hours ago. The problem occurs constantly. The problem has not changed since onset.Pertinent negatives include no chest pain, no abdominal pain and no headaches. Nothing aggravates the symptoms. Nothing relieves the symptoms. She has tried nothing for the symptoms.    Past Medical History  Diagnosis Date  . Hypertension   . GERD (gastroesophageal reflux disease)   . Cerebrovascular disease   . Peripheral vascular disease     40-59% left carotid stenosis, 0-39% right  . PAD (peripheral artery disease)     lower extremity PAD with bilateral leg stenting  . TIA (transient ischemic attack)   . COPD (chronic obstructive pulmonary disease)     Past Surgical History  Procedure Date  . Appendectomy   . Partial hysterectomy   . Gallbladder surgery   . Cholecystectomy     No family history on file.  History  Substance Use Topics  . Smoking status: Current Every Day Smoker -- 1.0 packs/day  . Smokeless tobacco: Not on file  . Alcohol Use: No    OB History    Grav Para Term Preterm Abortions TAB SAB Ect Mult Living                  Review of Systems  Constitutional: Negative for fatigue.  HENT: Negative for congestion, sinus pressure and ear discharge.   Eyes: Negative for discharge.  Respiratory: Negative for cough.   Cardiovascular: Negative for  chest pain.  Gastrointestinal: Negative for abdominal pain and diarrhea.  Genitourinary: Negative for frequency and hematuria.  Musculoskeletal: Positive for extremity weakness. Negative for back pain.  Skin: Negative for rash.  Neurological: Positive for weakness. Negative for seizures and headaches.       Weakness right arm  Hematological: Negative.   Psychiatric/Behavioral: Negative for hallucinations.    Allergies  Motrin; Albuterol; Amoxicillin; Cephalexin; Codeine; Entex pac; Etanercept; Fluticasone-salmeterol; Gualenic acid; Lorazepam; Nubain; Oxycodone hcl; and Oxycodone-acetaminophen  Home Medications   Current Outpatient Rx  Name Route Sig Dispense Refill  . CILOSTAZOL 100 MG PO TABS Oral Take 50 mg by mouth 2 (two) times daily.     Marland Kitchen DABIGATRAN ETEXILATE MESYLATE 150 MG PO CAPS Oral Take 1 capsule (150 mg total) by mouth every 12 (twelve) hours. 60 capsule 2    .Marland KitchenPatient needs to contact office to schedule  App ...  . HYDROCHLOROTHIAZIDE 12.5 MG PO TABS Oral Take 12.5 mg by mouth every morning.     Marland Kitchen VERAPAMIL HCL 240 MG PO TBCR Oral Take 240 mg by mouth daily. At lunch      BP 130/74  Pulse 97  Temp 97.5 F (36.4 C) (Oral)  Resp 20  SpO2 95%  Physical Exam  Constitutional: She is oriented to person, place, and time. She appears well-developed.  HENT:  Head: Normocephalic and atraumatic.  Eyes: Conjunctivae normal and EOM are normal. No  scleral icterus.  Neck: Neck supple. No thyromegaly present.  Cardiovascular: Normal rate and regular rhythm.  Exam reveals no gallop and no friction rub.   No murmur heard. Pulmonary/Chest: No stridor. She has no wheezes. She has no rales. She exhibits no tenderness.  Abdominal: She exhibits no distension. There is no tenderness. There is no rebound.  Musculoskeletal: Normal range of motion. She exhibits no edema.  Lymphadenopathy:    She has no cervical adenopathy.  Neurological: She is alert and oriented to person, place, and  time. No cranial nerve deficit. Coordination abnormal.       Poor coordination right arm and right leg  Skin: No rash noted. No erythema.  Psychiatric: She has a normal mood and affect. Her behavior is normal.    ED Course  Procedures (including critical care time)  Labs Reviewed  CBC WITH DIFFERENTIAL - Abnormal; Notable for the following:    RBC 5.69 (*)     Hemoglobin 17.1 (*)     HCT 49.1 (*)     All other components within normal limits  COMPREHENSIVE METABOLIC PANEL - Abnormal; Notable for the following:    Potassium 3.4 (*)     Chloride 94 (*)     CO2 35 (*)     Total Protein 5.9 (*)     Albumin 3.4 (*)     GFR calc non Af Amer 68 (*)     GFR calc Af Amer 79 (*)     All other components within normal limits   Ct Head Wo Contrast  05/02/2012  *RADIOLOGY REPORT*  Clinical Data: 77 year old female with extremity weakness.  CT HEAD WITHOUT CONTRAST  Technique:  Contiguous axial images were obtained from the base of the skull through the vertex without contrast.  Comparison: 05/29/2008.  Findings: Mucosal thickening and bubbly opacity in the sphenoid sinuses.  Other Visualized paranasal sinuses and mastoids are clear.  No acute osseous abnormality identified.  Visualized orbits and scalp soft tissues are within normal limits.  Small chronic left superior frontal gyrus cortical infarct on series 2 image 20 appears not significantly changed.  Stable cerebral volume.  No ventriculomegaly.  Chronic-appearing lacunar infarct at the anterior limbs of the right external and internal capsule is new from prior. No evidence of cortically based acute infarction identified.  No midline shift, mass effect, or evidence of mass lesion.  No acute intracranial hemorrhage identified.  No suspicious intracranial vascular hyperdensity.  IMPRESSION: 1.  Chronic small and medium-sized vessel ischemia.  No acute infarct identified. 2.  Mild acute sphenoid sinus inflammatory changes.   Original Report  Authenticated By: Harley Hallmark, M.D.      No diagnosis found.   CRITICAL CARE Performed by: Katie Faraone L   Total critical care time: 35  Critical care time was exclusive of separately billable procedures and treating other patients.  Critical care was necessary to treat or prevent imminent or life-threatening deterioration.  Critical care was time spent personally by me on the following activities: development of treatment plan with patient and/or surrogate as well as nursing, discussions with consultants, evaluation of patient's response to treatment, examination of patient, obtaining history from patient or surrogate, ordering and performing treatments and interventions, ordering and review of laboratory studies, ordering and review of radiographic studies, pulse oximetry and re-evaluation of patient's condition.  MDM       Date: 05/02/2012  Rate: 102  Rhythm: atrial fibrillation  QRS Axis: normal  Intervals: normal  ST/T Wave abnormalities: nonspecific  ST changes  Conduction Disutrbances:none  Narrative Interpretation:   Old EKG Reviewed: none available     Benny Lennert, MD 05/02/12 2038  Benny Lennert, MD 05/02/12 [phone]

## 2012-05-02 NOTE — ED Notes (Signed)
Applied ice pack to right wrist where heplock was removed .bruising noted at site

## 2012-05-02 NOTE — ED Notes (Signed)
Patient has a jerking motion to right arm.

## 2012-05-02 NOTE — H&P (Addendum)
[facility] History and Physical  Caroline [name] WJX:[id number] DOB: 23-Nov-1934 DOA: 05/02/2012  Referring physician: EDP Zammit PCP: Cassell Smiles., MD  Specialists: [facility], [facility]  Chief Complaint: Left facial numbness, left eye lid droop, right arm spasms  HPI: Caroline [name] is a 77 y.o. female with severe PVD with bilateral lower extremity stents, advanced COPD, A-Fib, ongoing tobacco abuse and known CVD/previous lacunar strokes presented to [facility] c/o acute onset left facial numbness, "drawing of her left face", and eye lid droop that started after waking this morning, became progressively worse and she began to have right arm symptoms of a clumsy hand, and arm spontaneously twitching and jumping. She is on Pradaxa for stroke prevention in setting of chronic rate controlled A-Fib and Pletal for her PVD. She reports complete and accurate medication administration and adherence.   Patient has prior history of stroke remotely and evidence of chronic lacunar stroke on her CT scan- at baseline she has a clumsy right hand and difficulty writing from her old lacunar stroke, but this is much worse today. EDP requested admission for stroke work-up and further diagnostic evaluation, however patient is refusing MRI, adamantly and will not take medication for anxiety. She has agreed to come into hospital overnight for monitoring on tele and neuro-checks as well as to optimize medical secondary prevention regimen.   Review of Systems: Review of Systems  Constitutional: Positive for weight loss and malaise/fatigue.  HENT: Positive for ear pain and congestion.   Eyes: Negative.   Respiratory: Positive for cough and shortness of breath.   Cardiovascular: Positive for claudication.  Gastrointestinal: Positive for abdominal pain.  Genitourinary: Negative.   Musculoskeletal: Positive for back pain.  Skin: Negative.   Neurological: Positive for dizziness, tremors, sensory change, focal  weakness and weakness.  Endo/Heme/Allergies: Negative.   Psychiatric/Behavioral: Positive for depression. The patient is nervous/anxious.   All other systems reviewed and are negative.     Past Medical History  Diagnosis Date  . Hypertension   . GERD (gastroesophageal reflux disease)   . Cerebrovascular disease   . Peripheral vascular disease     40-59% left carotid stenosis, 0-39% right  . PAD (peripheral artery disease)     lower extremity PAD with bilateral leg stenting  . TIA (transient ischemic attack)   . COPD (chronic obstructive pulmonary disease)   . CVA 10/25/2008    Qualifier: Diagnosis of  By: Marciano Sequin     Past Surgical History  Procedure Date  . Appendectomy   . Partial hysterectomy   . Gallbladder surgery   . Cholecystectomy    Social History:  reports that she has been smoking.  She does not have any smokeless tobacco history on file. She reports that she does not drink alcohol. Her drug history not on file. Lives at home with her husband who is in very poor health as well, she is his primary caregiver.  Allergies  Allergen Reactions  . Motrin (Ibuprofen)     Gi upset  . Albuterol     Confusion and shortness of breath  . Amoxicillin Hives  . Cephalexin Hives  . Codeine     confusion  . Entex Pac (Pseudoephedrine-Gg & Dm)   . Etanercept     vertigo  . Fluticasone-Salmeterol     Confusion and shortness of breath  . Gualenic Acid   . Lorazepam     hallucinations  . Nubain (Nalbuphine Hcl)   . Oxycodone Hcl Nausea And Vomiting  . Oxycodone-Acetaminophen  Nausea And Vomiting    Family History: Patient has family history of stroke, COPD, and CAD.   Prior to Admission medications   Medication Sig Start Date End Date Taking? Authorizing Provider  cilostazol (PLETAL) 100 MG tablet Take 50 mg by mouth 2 (two) times daily.    Yes Historical Provider, MD  dabigatran (PRADAXA) 150 MG CAPS Take 1 capsule (150 mg total) by mouth every 12 (twelve)  hours. 03/24/12  Yes Rollene Rotunda, MD  hydrochlorothiazide (HYDRODIURIL) 12.5 MG tablet Take 12.5 mg by mouth every morning.    Yes Historical Provider, MD  verapamil (CALAN-SR) 240 MG CR tablet Take 240 mg by mouth daily. At lunch   Yes Historical Provider, MD   Physical Exam: Filed Vitals:   05/02/12 1907 05/02/12 2036 05/02/12 2050 05/02/12 2100  BP: 130/74 148/79  126/90  Pulse: 97 94  72  Temp: 97.5 F (36.4 C)  98.4 F (36.9 C)   TempSrc: Oral Oral    Resp: 20 20  21   SpO2: 95%   94%     General:  Frail, ill-appearing, but alert, talkative and very "stubborn" regarding medical recommendations, significant anxiety  Eyes: PERRL  ENT: congestion evident, no oral lesions  Neck: normal  Cardiovascular: IRIR  Respiratory: No wheezing, hyperexpanded  Abdomen: soft, non-tender  Skin: Poorly perfused LE, hyperemic with gravity, no rest pain, dry-scaling and red/purple appearing  Musculoskeletal: generalized muscle atrophy  Psychiatric: anxious, scared, mistrusting of medical providers  Neurologic: Left facial numbness, no obvious droop, slight left sided lid lag, right arm is clumsy and has chorea-like movements/hemiballimus evident.  Labs on Admission:  Basic Metabolic Panel:  Lab 05/02/12 1610  NA 135  K 3.4*  CL 94*  CO2 35*  GLUCOSE 83  BUN 16  CREATININE 0.82  CALCIUM 8.9  MG --  PHOS --   Liver Function Tests:  Lab 05/02/12 1940  AST 17  ALT 17  ALKPHOS 85  BILITOT 0.3  PROT 5.9*  ALBUMIN 3.4*   CBC:  Lab 05/02/12 1940  WBC 8.0  NEUTROABS 4.9  HGB 17.1*  HCT 49.1*  MCV 86.3  PLT 179   Radiological Exams on Admission: Ct Head Wo Contrast  05/02/2012  *RADIOLOGY REPORT*  Clinical Data: 77 year old female with extremity weakness.  CT HEAD WITHOUT CONTRAST  Technique:  Contiguous axial images were obtained from the base of the skull through the vertex without contrast.  Comparison: 05/29/2008.  Findings: Mucosal thickening and bubbly  opacity in the sphenoid sinuses.  Other Visualized paranasal sinuses and mastoids are clear.  No acute osseous abnormality identified.  Visualized orbits and scalp soft tissues are within normal limits.  Small chronic left superior frontal gyrus cortical infarct on series 2 image 20 appears not significantly changed.  Stable cerebral volume.  No ventriculomegaly.  Chronic-appearing lacunar infarct at the anterior limbs of the right external and internal capsule is new from prior. No evidence of cortically based acute infarction identified.  No midline shift, mass effect, or evidence of mass lesion.  No acute intracranial hemorrhage identified.  No suspicious intracranial vascular hyperdensity.  IMPRESSION: 1.  Chronic small and medium-sized vessel ischemia.  No acute infarct identified. 2.  Mild acute sphenoid sinus inflammatory changes.   Original Report Authenticated By: Harley Hallmark, M.D.     EKG: Independently reviewed. A-FIB.  Problem List: 1. CVD, Stroke, Acute and Chronic 2. Severe PAD/PVD, bilateral leg stents, claudication 3. Carotid Artery Disease, significant irregular plaques 12/2011 outpatient dopplers 4. Advanced  COPD, not on treatment due to medication reactions/patient choice 5. Maxillary Sinus Disease, chronic ear infections 6. GERD, chronic abdominal pain, likely vascular as well 7. Ongoing Tobacco Abuse, severe addiction-will not quit, cut-back or attempt to quit 8. Anxiety disorder, refuses medical treatment or procedures-phobias 9. Atrial Fibrillation, rate controlled on medication 10. Osteoarthritis, DJD 11. Hypokalemia  Assessment: 1. Acute Stroke, probable Lacunar/Embolic This 77 yo woman with multiple chronic, end stage medical problems comes in with probable acute stroke, lacunar most likely given clinical exam findings-especially with hemiballismus/clumsy hand on the right side. She has known A-Fib, is rate controlled on verapamil and on Pradaxa for secondary embolic  stroke prevention- she is also on Pletal/antiplatelet for her PVD and stents. She reports being 100% compliant with these medications- she has chosen to follow the recommendation of Dr. Antoine Poche to take these medications because she has "complete trust" in him and views them as being safe and important for her to take. She however will not take inhalers for her COPD, will not use nicotine replacement or medication for anxiety.   Unfortunately even with both anticoagulation and anti-platelet therapy she appears to have had another stroke in the setting of A-Fib and CVD. She is absolutely refusing MRI, does not want ANY invasive procedures done - While an MRI may be helpful in proving stroke or the extent of her disease I am not sure it will change our management in terms of secondary prevention anyway and she would probably not be a candidate for TPA now or in the future given her stated goals of care. I offered her discharge home with the addition of a statin for plaque stabilization and out patient follow-up with Dr. Antoine Poche who she has the most therapeutic relationship with in terms of accepting medical care- but she and her family would like her to be observed overnight and re-consider options for diagnostics and treatment in the AM. I agreed to an observation admission to make sure she does not have evidence of hypoperfusion/hypotension, rapid A-Fib, or acutely worsening symptoms/bleeding. I spoke with on call neurologist at [facility] who advised continuation of the Pradaxa and Pletal while in patient as long as there is no evidence of ICH. Will check 2D echo for possile thrombus, but again this will not change current management which is antiplatelet and anti-coagulation-she has indications for both therapies, any potential benefit must take into account an increased risk of bleeding.  2. Hypokalemia I also changed the patients HCTZ to Triamterene-HCTZ because she had significant hypokalemia after starting HCTZ  and her potasium supplementation had been stopped 2 days ago. 3.4 on admission, no supplementation given.  3. COPD, appears to have very advanced disease with frailty, but she will not use inhalers or oxygen, her sats at rest are around 95%, but I suspect she has significant desaturation with activity leading to air hunger and anxiety, as well as the underlying reason for her fatigue. CXR shows nothing acute.  4. Failure to Thrive: Her weight was 133 lbs in Dr. Jenene Slicker office 1 year ago with BMI of 22, She now weighs 114 lbs now BMI 19.   5. Anxiety, debilitating: Her poorly controlled anxiety makes providing medical care extremely difficult, will continue to support her in goals of care, choices and provide education.  Code Status: DNR, clearly stated and confirmed with patient and her daughter, she says she carries her DNR in her pocket book, but "don't give up on me". I assured her DNR does not mean we do  not treat her if she accepts recommendations. Family Communication: Plan discussed in detail with patient, her daughter and son at bedside. Disposition Plan: Home tomorrow if medically stable.  Time spent: 80 minutes  [facility] Pager [phone]  If 7PM-7AM, please contact night-coverage [email or web address] Password [email or web address] 05/02/2012, 10:11 PM      i

## 2012-05-03 ENCOUNTER — Observation Stay (HOSPITAL_COMMUNITY): Payer: Medicare Other

## 2012-05-03 DIAGNOSIS — J449 Chronic obstructive pulmonary disease, unspecified: Secondary | ICD-10-CM | POA: Diagnosis not present

## 2012-05-03 DIAGNOSIS — F17200 Nicotine dependence, unspecified, uncomplicated: Secondary | ICD-10-CM | POA: Diagnosis not present

## 2012-05-03 DIAGNOSIS — I658 Occlusion and stenosis of other precerebral arteries: Secondary | ICD-10-CM | POA: Diagnosis not present

## 2012-05-03 DIAGNOSIS — J4489 Other specified chronic obstructive pulmonary disease: Secondary | ICD-10-CM

## 2012-05-03 DIAGNOSIS — G255 Other chorea: Secondary | ICD-10-CM | POA: Diagnosis present

## 2012-05-03 DIAGNOSIS — I4891 Unspecified atrial fibrillation: Secondary | ICD-10-CM | POA: Diagnosis not present

## 2012-05-03 DIAGNOSIS — I6350 Cerebral infarction due to unspecified occlusion or stenosis of unspecified cerebral artery: Secondary | ICD-10-CM | POA: Diagnosis not present

## 2012-05-03 LAB — URINALYSIS, ROUTINE W REFLEX MICROSCOPIC
Bilirubin Urine: NEGATIVE
Nitrite: NEGATIVE
Protein, ur: NEGATIVE mg/dL
Specific Gravity, Urine: 1.015 (ref 1.005–1.030)
Urobilinogen, UA: 0.2 mg/dL (ref 0.0–1.0)

## 2012-05-03 LAB — BASIC METABOLIC PANEL
BUN: 11 mg/dL (ref 6–23)
Calcium: 9.2 mg/dL (ref 8.4–10.5)
GFR calc Af Amer: >90 mL/min (ref >90)
GFR calc non Af Amer: 86 mL/min — ABNORMAL LOW (ref >90)
Glucose, Bld: 92 mg/dL (ref 70–99)
Potassium: 3.5 mEq/L (ref 3.5–5.1)
Sodium: 139 mEq/L (ref 135–145)

## 2012-05-03 LAB — CBC
MCH: 29.5 pg (ref 26.0–34.0)
MCHC: 34.1 g/dL (ref 30.0–36.0)
MCV: 86.4 fL (ref 78.0–100.0)
Platelets: 174 10*3/uL (ref 150–400)
RBC: 5.67 MIL/uL — ABNORMAL HIGH (ref 3.87–5.11)
RDW: 15 % (ref 11.5–15.5)

## 2012-05-03 LAB — LIPID PANEL
Cholesterol: 186 mg/dL (ref 0–200)
HDL: 71 mg/dL (ref >39)
LDL Cholesterol: 101 mg/dL — ABNORMAL HIGH (ref 0–99)
Triglycerides: 72 mg/dL (ref <150)
VLDL: 14 mg/dL (ref 0–40)

## 2012-05-03 LAB — URINE MICROSCOPIC-ADD ON

## 2012-05-03 LAB — HEMOGLOBIN A1C: Hgb A1c MFr Bld: 6.1 % — ABNORMAL HIGH (ref <5.7)

## 2012-05-03 MED ORDER — INFLUENZA VIRUS VACC SPLIT PF IM SUSP
0.5000 mL | INTRAMUSCULAR | Status: AC
  Administered 2012-05-03: 0.5 mL via INTRAMUSCULAR
  Filled 2012-05-03: qty 0.5

## 2012-05-03 MED ORDER — CILOSTAZOL 100 MG PO TABS
ORAL_TABLET | ORAL | Status: AC
  Filled 2012-05-03: qty 1

## 2012-05-03 MED ORDER — DABIGATRAN ETEXILATE MESYLATE 150 MG PO CAPS
ORAL_CAPSULE | ORAL | Status: AC
  Filled 2012-05-03: qty 1

## 2012-05-03 NOTE — Progress Notes (Signed)
Subjective: This lady came in yesterday with just a few minutes of left facial weakness but many hours of right arm shaking. She was unable to control her right arm, even though she was conscious. The shaking finally stopped yesterday evening, having started approximately 2 PM, ending at approximately 10 PM yesterday. She now feels back to her usual self. She does have a history of CVA in the past. She's been very resistant to have further testing at this time but may consider it.           Physical Exam: Blood pressure 93/79, pulse 78, temperature 97.8 F (36.6 C), temperature source Oral, resp. rate 20, height 5\' 4"  (1.626 m), weight 51.7 kg (113 lb 15.7 oz), SpO2 95.00%. She looks systemically well. Heart sounds are present and appear to be irregular, consistent with atrial fibrillation. Lung fields are clear except for poor air entry bilaterally. She is alert and orientated. She really does not have any focal neurological signs this morning.   Investigations:  Recent Results (from the past 240 hour(s))  URINE CULTURE     Status: Normal   Collection Time   04/24/12 12:05 AM      Component Value Range Status Comment   Specimen Description URINE, CLEAN CATCH   Final    Special Requests NONE   Final    Culture  Setup Time 04/24/2012 00:20   Final    Colony Count NO GROWTH   Final    Culture NO GROWTH   Final    Report Status 04/25/2012 FINAL   Final      Basic Metabolic Panel:  Basename 05/03/12 0645 05/02/12 1940  NA 139 135  K 3.5 3.4*  CL 97 94*  CO2 35* 35*  GLUCOSE 92 83  BUN 11 16  CREATININE 0.63 0.82  CALCIUM 9.2 8.9  MG -- --  PHOS -- --   Liver Function Tests:  [facility] 05/02/12 1940  AST 17  ALT 17  ALKPHOS 85  BILITOT 0.3  PROT 5.9*  ALBUMIN 3.4*     CBC:  Basename 05/03/12 0645 05/02/12 1940  WBC 6.2 8.0  NEUTROABS -- 4.9  HGB 16.7* 17.1*  HCT 49.0* 49.1*  MCV 86.4 86.3  PLT 174 179    Dg Chest 2 View  05/02/2012  *RADIOLOGY  REPORT*  Clinical Data: Shortness of breath and stroke symptoms.  CHEST - 2 VIEW  Comparison: 04/23/2012  Findings: Normal heart size and pulmonary vascularity. Hyperinflation suggests emphysema.  Fibrosis in the lung bases. Old left rib fractures.  No focal airspace consolidation.  No pneumothorax.  No blunting of costophrenic angles.  Mediastinal contours are intact.  Calcification of the aorta.  Stable appearance since previous study.  IMPRESSION: Chronic emphysematous changes and fibrosis in the lungs.  No evidence of active pulmonary disease.   Original Report Authenticated By: Marlon Pel, M.D.    Ct Head Wo Contrast  05/02/2012  *RADIOLOGY REPORT*  Clinical Data: 77 year old female with extremity weakness.  CT HEAD WITHOUT CONTRAST  Technique:  Contiguous axial images were obtained from the base of the skull through the vertex without contrast.  Comparison: 05/29/2008.  Findings: Mucosal thickening and bubbly opacity in the sphenoid sinuses.  Other Visualized paranasal sinuses and mastoids are clear.  No acute osseous abnormality identified.  Visualized orbits and scalp soft tissues are within normal limits.  Small chronic left superior frontal gyrus cortical infarct on series 2 image 20 appears not significantly changed.  Stable  cerebral volume.  No ventriculomegaly.  Chronic-appearing lacunar infarct at the anterior limbs of the right external and internal capsule is new from prior. No evidence of cortically based acute infarction identified.  No midline shift, mass effect, or evidence of mass lesion.  No acute intracranial hemorrhage identified.  No suspicious intracranial vascular hyperdensity.  IMPRESSION: 1.  Chronic small and medium-sized vessel ischemia.  No acute infarct identified. 2.  Mild acute sphenoid sinus inflammatory changes.   Original Report Authenticated By: Harley Hallmark, M.D.       Medications: I have reviewed the patient's current medications.  Impression: 1.  Probable seizure affecting right arm. 2. Possible new TIA/CVA. 3. Chronic atrial fibrillation, on anticoagulation. 4. Peripheral vascular disease. 5. Ongoing tobacco abuse.     Plan: 1. Continue current therapy. 2. I have urged her to reconsider having MRI brain scan. She will think about this. If she elects not to undergo MRI brain scan, then we can discharge her home today, otherwise we will have to wait until tomorrow to obtain the MRI.     LOS: 1 day   [name] Singer Pager [phone]  05/03/2012, 8:15 AM

## 2012-05-03 NOTE — Plan of Care (Signed)
Problem: Phase I Progression Outcomes Goal: NIH Stroke Scale-NIHSS done on admission Outcome: Completed/Met Date Met:  05/03/12 Done in ED

## 2012-05-04 ENCOUNTER — Ambulatory Visit (HOSPITAL_COMMUNITY): Payer: Medicare Other

## 2012-05-04 ENCOUNTER — Inpatient Hospital Stay (HOSPITAL_COMMUNITY): Admit: 2012-05-04 | Discharge: 2012-05-04 | Disposition: A | Discharge status: Home / Self Care | Payer: Medicare Other

## 2012-05-04 DIAGNOSIS — E876 Hypokalemia: Secondary | ICD-10-CM | POA: Diagnosis not present

## 2012-05-04 DIAGNOSIS — R221 Localized swelling, mass and lump, neck: Secondary | ICD-10-CM | POA: Diagnosis not present

## 2012-05-04 DIAGNOSIS — I4891 Unspecified atrial fibrillation: Secondary | ICD-10-CM | POA: Diagnosis present

## 2012-05-04 DIAGNOSIS — I6529 Occlusion and stenosis of unspecified carotid artery: Secondary | ICD-10-CM | POA: Diagnosis present

## 2012-05-04 DIAGNOSIS — Z23 Encounter for immunization: Secondary | ICD-10-CM | POA: Diagnosis not present

## 2012-05-04 DIAGNOSIS — F17200 Nicotine dependence, unspecified, uncomplicated: Secondary | ICD-10-CM | POA: Diagnosis not present

## 2012-05-04 DIAGNOSIS — R4182 Altered mental status, unspecified: Secondary | ICD-10-CM | POA: Insufficient documentation

## 2012-05-04 DIAGNOSIS — Z66 Do not resuscitate: Secondary | ICD-10-CM | POA: Diagnosis present

## 2012-05-04 DIAGNOSIS — I6789 Other cerebrovascular disease: Secondary | ICD-10-CM | POA: Insufficient documentation

## 2012-05-04 DIAGNOSIS — J4489 Other specified chronic obstructive pulmonary disease: Secondary | ICD-10-CM | POA: Diagnosis not present

## 2012-05-04 DIAGNOSIS — F411 Generalized anxiety disorder: Secondary | ICD-10-CM | POA: Diagnosis present

## 2012-05-04 DIAGNOSIS — R569 Unspecified convulsions: Secondary | ICD-10-CM | POA: Diagnosis not present

## 2012-05-04 DIAGNOSIS — R220 Localized swelling, mass and lump, head: Secondary | ICD-10-CM | POA: Diagnosis not present

## 2012-05-04 DIAGNOSIS — Z79899 Other long term (current) drug therapy: Secondary | ICD-10-CM | POA: Diagnosis not present

## 2012-05-04 DIAGNOSIS — F29 Unspecified psychosis not due to a substance or known physiological condition: Secondary | ICD-10-CM | POA: Diagnosis not present

## 2012-05-04 DIAGNOSIS — R627 Adult failure to thrive: Secondary | ICD-10-CM | POA: Diagnosis not present

## 2012-05-04 DIAGNOSIS — R209 Unspecified disturbances of skin sensation: Secondary | ICD-10-CM | POA: Diagnosis not present

## 2012-05-04 DIAGNOSIS — M1990 Unspecified osteoarthritis, unspecified site: Secondary | ICD-10-CM | POA: Diagnosis present

## 2012-05-04 DIAGNOSIS — K219 Gastro-esophageal reflux disease without esophagitis: Secondary | ICD-10-CM | POA: Diagnosis not present

## 2012-05-04 DIAGNOSIS — I10 Essential (primary) hypertension: Secondary | ICD-10-CM | POA: Diagnosis not present

## 2012-05-04 DIAGNOSIS — R5381 Other malaise: Secondary | ICD-10-CM | POA: Insufficient documentation

## 2012-05-04 DIAGNOSIS — I63239 Cerebral infarction due to unspecified occlusion or stenosis of unspecified carotid arteries: Secondary | ICD-10-CM | POA: Diagnosis not present

## 2012-05-04 DIAGNOSIS — I959 Hypotension, unspecified: Secondary | ICD-10-CM | POA: Diagnosis not present

## 2012-05-04 DIAGNOSIS — G459 Transient cerebral ischemic attack, unspecified: Secondary | ICD-10-CM | POA: Diagnosis not present

## 2012-05-04 DIAGNOSIS — J449 Chronic obstructive pulmonary disease, unspecified: Secondary | ICD-10-CM | POA: Diagnosis not present

## 2012-05-04 DIAGNOSIS — Z8673 Personal history of transient ischemic attack (TIA), and cerebral infarction without residual deficits: Secondary | ICD-10-CM | POA: Diagnosis not present

## 2012-05-04 DIAGNOSIS — I6350 Cerebral infarction due to unspecified occlusion or stenosis of unspecified cerebral artery: Secondary | ICD-10-CM | POA: Diagnosis not present

## 2012-05-04 DIAGNOSIS — I739 Peripheral vascular disease, unspecified: Secondary | ICD-10-CM | POA: Diagnosis not present

## 2012-05-04 DIAGNOSIS — I6340 Cerebral infarction due to embolism of unspecified cerebral artery: Secondary | ICD-10-CM | POA: Diagnosis present

## 2012-05-04 DIAGNOSIS — I517 Cardiomegaly: Secondary | ICD-10-CM

## 2012-05-04 MED ORDER — SODIUM CHLORIDE 0.9 % IJ SOLN
INTRAMUSCULAR | Status: AC
  Administered 2012-05-04: 10 mL
  Filled 2012-05-04: qty 3

## 2012-05-04 MED ORDER — HEPARIN (PORCINE) IN NACL 100-0.45 UNIT/ML-% IJ SOLN
15.0000 [IU]/kg/h | INTRAMUSCULAR | Status: DC
  Administered 2012-05-04 (×2): 15 [IU]/kg/h via INTRAVENOUS
  Filled 2012-05-04: qty 250

## 2012-05-04 MED ORDER — SODIUM CHLORIDE 0.9 % IJ SOLN
INTRAMUSCULAR | Status: AC
  Filled 2012-05-04: qty 3

## 2012-05-04 MED ORDER — ALPRAZOLAM 1 MG PO TABS
1.0000 mg | ORAL_TABLET | Freq: Once | ORAL | Status: AC
  Administered 2012-05-04: 1 mg via ORAL
  Filled 2012-05-04: qty 1

## 2012-05-04 MED ORDER — ATORVASTATIN CALCIUM 80 MG PO TABS
80.0000 mg | ORAL_TABLET | Freq: Every day | ORAL | Status: DC
  Administered 2012-05-04 – 2012-05-12 (×8): 80 mg via ORAL
  Filled 2012-05-04 (×10): qty 1

## 2012-05-04 MED ORDER — HEPARIN BOLUS VIA INFUSION
1000.0000 [IU] | Freq: Once | INTRAVENOUS | Status: AC
  Administered 2012-05-04: 1000 [IU] via INTRAVENOUS
  Filled 2012-05-04: qty 1000

## 2012-05-04 NOTE — Progress Notes (Signed)
*  PRELIMINARY RESULTS* Echocardiogram 2D Echocardiogram has been performed.  Caroline [name] 05/04/2012, 10:01 AM

## 2012-05-04 NOTE — Progress Notes (Signed)
UR Chart Review Completed  

## 2012-05-04 NOTE — Progress Notes (Signed)
ANTICOAGULATION CONSULT NOTE - Initial Consult  Pharmacy Consult for Heparin IV Indication: bridge for surgery in patient with AFib previously on Pradaxa and h/o CVA  Allergies  Allergen Reactions  . Motrin (Ibuprofen)     Gi upset  . Albuterol     Confusion and shortness of breath  . Amoxicillin Hives  . Cephalexin Hives  . Codeine     confusion  . Entex Pac (Pseudoephedrine-Gg & Dm)   . Etanercept     vertigo  . Fluticasone-Salmeterol     Confusion and shortness of breath  . Gualenic Acid   . Lorazepam     hallucinations  . Nubain (Nalbuphine Hcl)   . Oxycodone Hcl Nausea And Vomiting  . Oxycodone-Acetaminophen Nausea And Vomiting    Patient Measurements: Height: 5\' 4"  (162.6 cm) Weight: 113 lb 12.8 oz (51.619 kg) IBW/kg (Calculated) : 54.7   Vital Signs: Temp: 98.1 F (36.7 C) (10/28 0618) Temp src: Oral (10/28 0620) BP: 111/74 mmHg (10/28 1100) Pulse Rate: 106  (10/28 1100)  Labs:  [facility] 05/03/12 0645 05/02/12 1940  HGB 16.7* 17.1*  HCT 49.0* 49.1*  PLT 174 179  APTT -- --  LABPROT -- --  INR -- --  HEPARINUNFRC -- --  CREATININE 0.63 0.82  CKTOTAL -- --  CKMB -- --  TROPONINI -- --    Estimated Creatinine Clearance: 49.5 ml/min (by C-G formula based on Cr of 0.63).   Medical History: Past Medical History  Diagnosis Date  . Hypertension   . GERD (gastroesophageal reflux disease)   . Cerebrovascular disease   . Peripheral vascular disease     40-59% left carotid stenosis, 0-39% right  . PAD (peripheral artery disease)     lower extremity PAD with bilateral leg stenting  . TIA (transient ischemic attack)   . COPD (chronic obstructive pulmonary disease)   . CVA 10/25/2008    Qualifier: Diagnosis of  By: Cristela Felt, CNA, Christy      Medications:  Scheduled:    . ALPRAZolam  1 mg Oral Once  . atorvastatin  80 mg Oral q1800  . heparin  1,000 Units Intravenous Once  . sodium chloride  3 mL Intravenous Q12H  . tiotropium  18 mcg Inhalation  Daily  . verapamil  240 mg Oral Daily  . DISCONTD: atorvastatin  40 mg Oral q1800  . DISCONTD: cilostazol  50 mg Oral BID  . DISCONTD: dabigatran  150 mg Oral Q12H  . DISCONTD: triamterene-hydrochlorothiazide  1 each Oral Daily    Assessment: 77yo female with h/o CVA and AFib.  Was on Pradaxa PTA.  Pradaxa stopped and heparin ordered as bridge for CEA. Goal of Therapy:  Heparin level 0.3-0.7 units/ml Monitor platelets by anticoagulation protocol: Yes   Plan: Heparin 1000 units iv x 1 then 750 units/hr Titrate for heparin level 0.3-0.7 Check heparin level tonight and daily CBC tomorrow, monitor platelets per protocol  Valrie Hart A 05/04/2012,1:13 PM

## 2012-05-04 NOTE — Progress Notes (Signed)
eeg completed ° °

## 2012-05-04 NOTE — Procedures (Signed)
[facility] Jahkeem Kurka A. Gerilyn Pilgrim, MD     [email or web address]  FACILITY: [facility]  PHYSICIAN: Holly Pring A. Gerilyn Pilgrim, M.D.  DATE OF PROCEDURE: 05/04/2012 EEG INTERPRETATION  INDICATION: This is a 77 year old female who presents with episodic shaking of the right upper extremity.  MEDICATIONS:  Scheduled Meds:   . ALPRAZolam  1 mg Oral Once  . atorvastatin  80 mg Oral q1800  . heparin  1,000 Units Intravenous Once  . sodium chloride  3 mL Intravenous Q12H  . sodium chloride      . sodium chloride      . tiotropium  18 mcg Inhalation Daily  . verapamil  240 mg Oral Daily  . DISCONTD: atorvastatin  40 mg Oral q1800  . DISCONTD: cilostazol  50 mg Oral BID  . DISCONTD: dabigatran  150 mg Oral Q12H  . DISCONTD: triamterene-hydrochlorothiazide  1 each Oral Daily   Continuous Infusions:   . heparin 15 Units/kg/hr (05/04/12 1625)   PRN Meds:.sodium chloride, acetaminophen, acetaminophen, ALPRAZolam, ondansetron (ZOFRAN) IV, senna-docusate, sodium chloride   ANALYSIS: A 16-channel recording using standard 10/20 measurements is conducted for 23 minutes. There is a low voltage beta activity in the background posteriorly of 12 Hz which attenuates with eye opening. There is also beta activity in the frontal areas. Awake and sleep activities are observed. K complexes and sleep spindles are recorded. Photic stimulation is carried out without abnormal changes in the background activity. There is no focal or lateralized slowing. There is no epileptiform activity observed.   IMPRESSION:  This is a normal recording of the awake and sleep states. However, a recording does not rule out epileptic seizures. If clinically indicated a sleep deprived or prolonged recording could be useful.  Yariah Selvey A. Gerilyn Pilgrim, M.D. Diplomate, Biomedical engineer of Psychiatry and Neurology ( Neurology).

## 2012-05-04 NOTE — Progress Notes (Signed)
Call received by Mat Carne RN this AM regarding right arm swelling and pain in right arm and request for extremity imaging. Right arm is neurovascularly intact, EMS IV sticks and pain, hematoma from anticoagulation. This arm is also arm affected by stroke. Team will evaluate on rounds and determine if additional imaging is necessary. Continue cold compresses and immobilization if needed to reduce pain.

## 2012-05-04 NOTE — Therapy (Signed)
SPEECH PATHOLOGY  Consult received for SLP eval and treat. Chart reviewed and acknowledge that patient passed RN swallow screen and has been tolerating diet. Pt is being transferred to [facility]. Reconsult SLP if indicated after transfer.  Havery Moros, CCC-SLP [phone]

## 2012-05-04 NOTE — Progress Notes (Addendum)
Subjective: This lady came in  with just a few minutes of left facial weakness but many hours of right arm shaking. She was unable to control her right arm, even though she was conscious. The shaking finally stopped, having started approximately 2 PM, ending at approximately 10 PM on the day prior to admission. She now feels back to her usual self. She does have a history of CVA in the past. She's been very resistant to have further testing at this time but did finally agree to have MRI brain scan done which she will have this morning. Bilateral carotid Dopplers are significant for hemodynamically significant stenosis in the left internal carotid artery.           Physical Exam: Blood pressure 89/63, pulse 135, temperature 98.1 F (36.7 C), temperature source Oral, resp. rate 20, height 5\' 4"  (1.626 m), weight 51.619 kg (113 lb 12.8 oz), SpO2 94.00%. She looks systemically well. Heart sounds are present and appear to be irregular, consistent with atrial fibrillation. Lung fields are clear except for poor air entry bilaterally. She is alert and orientated. She really does not have any focal neurological signs this morning.   Investigations:  No results found for this or any previous visit (from the past 240 hour(s)).   Basic Metabolic Panel:  Basename 05/03/12 0645 05/02/12 1940  NA 139 135  K 3.5 3.4*  CL 97 94*  CO2 35* 35*  GLUCOSE 92 83  BUN 11 16  CREATININE 0.63 0.82  CALCIUM 9.2 8.9  MG -- --  PHOS -- --   Liver Function Tests:  [facility] 05/02/12 1940  AST 17  ALT 17  ALKPHOS 85  BILITOT 0.3  PROT 5.9*  ALBUMIN 3.4*     CBC:  Basename 05/03/12 0645 05/02/12 1940  WBC 6.2 8.0  NEUTROABS -- 4.9  HGB 16.7* 17.1*  HCT 49.0* 49.1*  MCV 86.4 86.3  PLT 174 179    Dg Chest 2 View  05/02/2012  *RADIOLOGY REPORT*  Clinical Data: Shortness of breath and stroke symptoms.  CHEST - 2 VIEW  Comparison: 04/23/2012  Findings: Normal heart size and pulmonary  vascularity. Hyperinflation suggests emphysema.  Fibrosis in the lung bases. Old left rib fractures.  No focal airspace consolidation.  No pneumothorax.  No blunting of costophrenic angles.  Mediastinal contours are intact.  Calcification of the aorta.  Stable appearance since previous study.  IMPRESSION: Chronic emphysematous changes and fibrosis in the lungs.  No evidence of active pulmonary disease.   Original Report Authenticated By: Marlon Pel, M.D.    Ct Head Wo Contrast  05/02/2012  *RADIOLOGY REPORT*  Clinical Data: 77 year old female with extremity weakness.  CT HEAD WITHOUT CONTRAST  Technique:  Contiguous axial images were obtained from the base of the skull through the vertex without contrast.  Comparison: 05/29/2008.  Findings: Mucosal thickening and bubbly opacity in the sphenoid sinuses.  Other Visualized paranasal sinuses and mastoids are clear.  No acute osseous abnormality identified.  Visualized orbits and scalp soft tissues are within normal limits.  Small chronic left superior frontal gyrus cortical infarct on series 2 image 20 appears not significantly changed.  Stable cerebral volume.  No ventriculomegaly.  Chronic-appearing lacunar infarct at the anterior limbs of the right external and internal capsule is new from prior. No evidence of cortically based acute infarction identified.  No midline shift, mass effect, or evidence of mass lesion.  No acute intracranial hemorrhage identified.  No suspicious intracranial vascular  hyperdensity.  IMPRESSION: 1.  Chronic small and medium-sized vessel ischemia.  No acute infarct identified. 2.  Mild acute sphenoid sinus inflammatory changes.   Original Report Authenticated By: Harley Hallmark, M.D.    US Carotid Duplex Bilateral  05/03/2012  *RADIOLOGY REPORT*  Clinical Data: Carotid stenosis, hypertension, cerebrovascular disease.  BILATERAL CAROTID DUPLEX ULTRASOUND  Technique: Wallace Cullens scale imaging, color Doppler and duplex ultrasound was  performed of bilateral carotid and vertebral arteries in the neck.  Comparison: None available  Criteria:  Quantification of carotid stenosis is based on velocity parameters that correlate the residual internal carotid diameter with NASCET-based stenosis levels, using the diameter of the distal internal carotid lumen as the denominator for stenosis measurement.  The following velocity measurements were obtained:                   PEAK SYSTOLIC/END DIASTOLIC RIGHT ICA:                        81/24cm/sec CCA:                        81/20cm/sec SYSTOLIC ICA/CCA RATIO:     1.0 DIASTOLIC ICA/CCA RATIO:    1.1 ECA:                        93cm/sec  LEFT ICA:                        333/83cm/sec CCA:                        55/17cm/sec SYSTOLIC ICA/CCA RATIO:     6.05 DIASTOLIC ICA/CCA RATIO:    4.99 ECA:                        184cm/sec  Findings:  RIGHT CAROTID ARTERY: There is mild diffuse irregular partially calcified plaque in the mid and distal common carotid artery, bulb, and proximal ICA without high-grade stenosis.  Normal wave forms and color Doppler signal.  RIGHT VERTEBRAL ARTERY:  Normal flow direction and waveform.  LEFT CAROTID ARTERY: Moderate eccentric irregular partially calcified plaque in the mid and distal common carotid artery.  More heavily calcified plaque effaces the carotid bulb and extends into the proximal ICA.  There is at least moderate short segment stenosis.  There is focal aliasing just distal to this segment on color Doppler interrogation, with markedly elevated peak systolic velocities.  In the more distal ICA, normal wave forms and color Doppler signal.  LEFT VERTEBRAL ARTERY:  Normal flow direction and waveform.  IMPRESSION:  1.  Bilateral carotid bifurcation and proximal ICA plaque, resulting in 70-99% diameter stenosis on the left, less than 50% diameter stenosis on the right.  Consider vascular surgical or neurointerventional   radiology consultation.   Original Report Authenticated By:  Osa Craver, M.D.       Medications: I have reviewed the patient's current medications.  Impression: 1. Probable seizure affecting right arm. 2. Possible new TIA/CVA. 70-99% left internal carotid artery stenosis. 3. Chronic atrial fibrillation, on anticoagulation with Pradaxa. 4. Peripheral vascular disease. On Pletal. 5. Ongoing tobacco abuse.     Plan: 1. Continue current therapy. 2. Await MRI brain scan report today. 3. Will contact vascular surgery for further recommendations regarding the hemodynamically significant left internal carotid artery stenosis.  LOS: 2 days   [name] Singer Pager [phone]  05/04/2012, 10:07 AM Addendum: I have spoken with vascular surgery who agrees that patient likely needs a carotid endarterectomy in view of symptomatic hemodynamically significant left internal carotid artery stenosis. I spoke with my colleague Dr. Jerral Ralph, who has accepted the patient in transfer today. Pletal will be discontinued. Pradaxa  needs to be discontinued, probably use intravenous heparin as a bridge prior to surgery. Cardiology and vascular surgery need to be consulted upon arrival to [facility].

## 2012-05-04 NOTE — Progress Notes (Signed)
Report called to [facility]  Pt transferred via care link

## 2012-05-04 NOTE — Care Management Note (Addendum)
    Page 1 of 2   05/13/2012     11:21:11 AM   CARE MANAGEMENT NOTE 05/13/2012  Patient:  VENNESA, BASTEDO   Account Number:  [id number]  Date Initiated:  05/04/2012  Documentation initiated by:  Rosemary Holms  Subjective/Objective Assessment:   Pt admitted from home where she lives with her spouse.  positive CVA, s/p Carotid     Action/Plan:   Transfer to [facility].   Anticipated DC Date:  05/13/2012   Anticipated DC Plan:  HOME W HOME HEALTH SERVICES      DC Planning Services  CM consult      [facility] Choice  HOME HEALTH   Choice offered to / List presented to:  C-1 Patient        HH arranged  HH-2 PT      [facility] agency  [facility]   Status of service:  Completed, signed off Medicare Important Message given?   (If response is "NO", the following Medicare IM given date fields will be blank) Date Medicare IM given:   Date Additional Medicare IM given:    Discharge Disposition:  HOME W HOME HEALTH SERVICES  Per UR Regulation:  Reviewed for med. necessity/level of care/duration of stay  If discussed at Long Length of Stay Meetings, dates discussed:    Comments:  05/13/12- 1045- Donn Pierini RN, BSN [phone] Pt s/p Carotid, with afib adjusting meds for HR control. Orders for HH-PT- in to speak with pt at bedside- pt up OOB in chair with daughter at bedside. Per conversation pt states that she has had HH in past with [facility]- and per pt choice will use them again for HH-PT services. Pt states that she has had home O2 in the past but does not currently have home O2 has she returned in and had them come pick it up from her home- due to her husband still smokes in the home and has dementia. Referral for HH-PT called to Hilda Lias with [facility]- services to begin within 24-48 post discharge.  05/04/12 1100 Amy Leanord Hawking RN BSN CM

## 2012-05-04 NOTE — Consult Note (Signed)
[facility] Qadir Folks A. Gerilyn Pilgrim, MD     [email or web address]         Caroline [name] is an 77 y.o. female.  HPI: The patient is 77 year old white female who presented with the acute onset of left facial weakness involving both the upper and lower facial muscles lasting for about 10-15 minutes. About the same time, she also developed shaking of the right upper extremity. The right upper extremity shaking has been more persistent however and seems to be induced by extension and movements of the right upper extremity. Sometimes however especially at the onset of the initial event, she had spontaneous shaking of the right upper extremity. The patient has had a previous infarct. She tells me that she was weak on the right side after the previous stroke. The patient's workup shows a high-grade stenosis involving the left internal carotid artery with a velocity of 300. She tells me that there is a known history of left ICA stenosis which is being followed by a physician at [facility] health care. The patient is on anticoagulation for atrial fibrillation and is also taking antiplatelet agent for peripheral artery stenosis/disease. She reports being compliant with his medications. The patient has had multiple admissions in the past for palpitations of COPD. He saw the patient several years ago for bilateral symmetric tremors. EEG at that time was unremarkable.  Past Medical History  Diagnosis Date  . Hypertension   . GERD (gastroesophageal reflux disease)   . Cerebrovascular disease   . Peripheral vascular disease     40-59% left carotid stenosis, 0-39% right  . PAD (peripheral artery disease)     lower extremity PAD with bilateral leg stenting  . TIA (transient ischemic attack)   . COPD (chronic obstructive pulmonary disease)   . CVA 10/25/2008    Qualifier: Diagnosis of  By: Marciano Sequin      Past Surgical History  Procedure Date  . Appendectomy   . Partial hysterectomy   . Gallbladder  surgery   . Cholecystectomy     History reviewed. No pertinent family history.  Social History:  reports that she has been smoking.  She uses smokeless tobacco. She reports that she does not drink alcohol. Her drug history not on file.  Allergies:  Allergies  Allergen Reactions  . Motrin (Ibuprofen)     Gi upset  . Albuterol     Confusion and shortness of breath  . Amoxicillin Hives  . Cephalexin Hives  . Codeine     confusion  . Entex Pac (Pseudoephedrine-Gg & Dm)   . Etanercept     vertigo  . Fluticasone-Salmeterol     Confusion and shortness of breath  . Gualenic Acid   . Lorazepam     hallucinations  . Nubain (Nalbuphine Hcl)   . Oxycodone Hcl Nausea And Vomiting  . Oxycodone-Acetaminophen Nausea And Vomiting    Medications:  Prior to Admission medications   Medication Sig Start Date End Date Taking? Authorizing Provider  cilostazol (PLETAL) 100 MG tablet Take 50 mg by mouth 2 (two) times daily.    Yes Historical Provider, MD  dabigatran (PRADAXA) 150 MG CAPS Take 1 capsule (150 mg total) by mouth every 12 (twelve) hours. 03/24/12  Yes Rollene Rotunda, MD  hydrochlorothiazide (HYDRODIURIL) 12.5 MG tablet Take 12.5 mg by mouth every morning.    Yes Historical Provider, MD  verapamil (CALAN-SR) 240 MG CR tablet Take 240 mg by mouth daily. At lunch   Yes Historical Provider, MD  Scheduled Meds:   . atorvastatin  40 mg Oral q1800  . cilostazol  50 mg Oral BID  . dabigatran  150 mg Oral Q12H  . influenza  inactive virus vaccine  0.5 mL Intramuscular Tomorrow-1000  . sodium chloride  3 mL Intravenous Q12H  . tiotropium  18 mcg Inhalation Daily  . triamterene-hydrochlorothiazide  1 each Oral Daily  . verapamil  240 mg Oral Daily   Continuous Infusions:  PRN Meds:.sodium chloride, acetaminophen, acetaminophen, ALPRAZolam, ondansetron (ZOFRAN) IV, senna-docusate, sodium chloride   Results for orders placed during the hospital encounter of 05/02/12 (from the past 48  hour(s))  CBC WITH DIFFERENTIAL     Status: Abnormal   Collection Time   05/02/12  7:40 PM      Component Value Range Comment   WBC 8.0  4.0 - 10.5 K/uL    RBC 5.69 (*) 3.87 - 5.11 MIL/uL    Hemoglobin 17.1 (*) 12.0 - 15.0 g/dL    HCT 60.4 (*) 54.0 - 46.0 %    MCV 86.3  78.0 - 100.0 fL    MCH 30.1  26.0 - 34.0 pg    MCHC 34.8  30.0 - 36.0 g/dL    RDW 98.1  19.1 - 47.8 %    Platelets 179  150 - 400 K/uL    Neutrophils Relative 62  43 - 77 %    Neutro Abs 4.9  1.7 - 7.7 K/uL    Lymphocytes Relative 27  12 - 46 %    Lymphs Abs 2.2  0.7 - 4.0 K/uL    Monocytes Relative 10  3 - 12 %    Monocytes Absolute 0.8  0.1 - 1.0 K/uL    Eosinophils Relative 1  0 - 5 %    Eosinophils Absolute 0.1  0.0 - 0.7 K/uL    Basophils Relative 0  0 - 1 %    Basophils Absolute 0.0  0.0 - 0.1 K/uL   COMPREHENSIVE METABOLIC PANEL     Status: Abnormal   Collection Time   05/02/12  7:40 PM      Component Value Range Comment   Sodium 135  135 - 145 mEq/L    Potassium 3.4 (*) 3.5 - 5.1 mEq/L    Chloride 94 (*) 96 - 112 mEq/L    CO2 35 (*) 19 - 32 mEq/L    Glucose, Bld 83  70 - 99 mg/dL    BUN 16  6 - 23 mg/dL    Creatinine, Ser 2.95  0.50 - 1.10 mg/dL    Calcium 8.9  8.4 - 62.1 mg/dL    Total Protein 5.9 (*) 6.0 - 8.3 g/dL    Albumin 3.4 (*) 3.5 - 5.2 g/dL    AST 17  0 - 37 U/L    ALT 17  0 - 35 U/L    Alkaline Phosphatase 85  39 - 117 U/L    Total Bilirubin 0.3  0.3 - 1.2 mg/dL    GFR calc non Af Amer 68 (*) >90 mL/min    GFR calc Af Amer 79 (*) >90 mL/min   TSH     Status: Normal   Collection Time   05/02/12  7:40 PM      Component Value Range Comment   TSH 2.886  0.350 - 4.500 uIU/mL   URINALYSIS, ROUTINE W REFLEX MICROSCOPIC     Status: Abnormal   Collection Time   05/03/12 12:25 AM      Component Value Range Comment  Color, Urine YELLOW  YELLOW    APPearance CLEAR  CLEAR    Specific Gravity, Urine 1.015  1.005 - 1.030    pH 7.0  5.0 - 8.0    Glucose, UA NEGATIVE  NEGATIVE mg/dL    Hgb  urine dipstick TRACE (*) NEGATIVE    Bilirubin Urine NEGATIVE  NEGATIVE    Ketones, ur NEGATIVE  NEGATIVE mg/dL    Protein, ur NEGATIVE  NEGATIVE mg/dL    Urobilinogen, UA 0.2  0.0 - 1.0 mg/dL    Nitrite NEGATIVE  NEGATIVE    Leukocytes, UA NEGATIVE  NEGATIVE   URINE MICROSCOPIC-ADD ON     Status: Normal   Collection Time   05/03/12 12:25 AM      Component Value Range Comment   Squamous Epithelial / LPF RARE  RARE    WBC, UA 0-2  <3 WBC/hpf    RBC / HPF 0-2  <3 RBC/hpf    Bacteria, UA RARE  RARE   HEMOGLOBIN A1C     Status: Abnormal   Collection Time   05/03/12  6:45 AM      Component Value Range Comment   Hemoglobin A1C 6.1 (*) <5.7 %    Mean Plasma Glucose 128 (*) <117 mg/dL   BASIC METABOLIC PANEL     Status: Abnormal   Collection Time   05/03/12  6:45 AM      Component Value Range Comment   Sodium 139  135 - 145 mEq/L    Potassium 3.5  3.5 - 5.1 mEq/L    Chloride 97  96 - 112 mEq/L    CO2 35 (*) 19 - 32 mEq/L    Glucose, Bld 92  70 - 99 mg/dL    BUN 11  6 - 23 mg/dL    Creatinine, Ser 1.61  0.50 - 1.10 mg/dL    Calcium 9.2  8.4 - 09.6 mg/dL    GFR calc non Af Amer 86 (*) >90 mL/min    GFR calc Af Amer >90  >90 mL/min   CBC     Status: Abnormal   Collection Time   05/03/12  6:45 AM      Component Value Range Comment   WBC 6.2  4.0 - 10.5 K/uL    RBC 5.67 (*) 3.87 - 5.11 MIL/uL    Hemoglobin 16.7 (*) 12.0 - 15.0 g/dL    HCT 04.5 (*) 40.9 - 46.0 %    MCV 86.4  78.0 - 100.0 fL    MCH 29.5  26.0 - 34.0 pg    MCHC 34.1  30.0 - 36.0 g/dL    RDW 81.1  91.4 - 78.2 %    Platelets 174  150 - 400 K/uL   LIPID PANEL     Status: Abnormal   Collection Time   05/03/12  6:45 AM      Component Value Range Comment   Cholesterol 186  0 - 200 mg/dL    Triglycerides 72  <956 mg/dL    HDL 71  >21 mg/dL    Total CHOL/HDL Ratio 2.6      VLDL 14  0 - 40 mg/dL    LDL Cholesterol 308 (*) 0 - 99 mg/dL     Dg Chest 2 View  65/78/4696  *RADIOLOGY REPORT*  Clinical Data: Shortness  of breath and stroke symptoms.  CHEST - 2 VIEW  Comparison: 04/23/2012  Findings: Normal heart size and pulmonary vascularity. Hyperinflation suggests emphysema.  Fibrosis in the lung bases. Old left  rib fractures.  No focal airspace consolidation.  No pneumothorax.  No blunting of costophrenic angles.  Mediastinal contours are intact.  Calcification of the aorta.  Stable appearance since previous study.  IMPRESSION: Chronic emphysematous changes and fibrosis in the lungs.  No evidence of active pulmonary disease.   Original Report Authenticated By: Marlon Pel, M.D.    Ct Head Wo Contrast  05/02/2012  *RADIOLOGY REPORT*  Clinical Data: 77 year old female with extremity weakness.  CT HEAD WITHOUT CONTRAST  Technique:  Contiguous axial images were obtained from the base of the skull through the vertex without contrast.  Comparison: 05/29/2008.  Findings: Mucosal thickening and bubbly opacity in the sphenoid sinuses.  Other Visualized paranasal sinuses and mastoids are clear.  No acute osseous abnormality identified.  Visualized orbits and scalp soft tissues are within normal limits.  Small chronic left superior frontal gyrus cortical infarct on series 2 image 20 appears not significantly changed.  Stable cerebral volume.  No ventriculomegaly.  Chronic-appearing lacunar infarct at the anterior limbs of the right external and internal capsule is new from prior. No evidence of cortically based acute infarction identified.  No midline shift, mass effect, or evidence of mass lesion.  No acute intracranial hemorrhage identified.  No suspicious intracranial vascular hyperdensity.  IMPRESSION: 1.  Chronic small and medium-sized vessel ischemia.  No acute infarct identified. 2.  Mild acute sphenoid sinus inflammatory changes.   Original Report Authenticated By: Harley Hallmark, M.D.    US Carotid Duplex Bilateral  05/03/2012  *RADIOLOGY REPORT*  Clinical Data: Carotid stenosis, hypertension, cerebrovascular  disease.  BILATERAL CAROTID DUPLEX ULTRASOUND  Technique: Wallace Cullens scale imaging, color Doppler and duplex ultrasound was performed of bilateral carotid and vertebral arteries in the neck.  Comparison: None available  Criteria:  Quantification of carotid stenosis is based on velocity parameters that correlate the residual internal carotid diameter with NASCET-based stenosis levels, using the diameter of the distal internal carotid lumen as the denominator for stenosis measurement.  The following velocity measurements were obtained:                   PEAK SYSTOLIC/END DIASTOLIC RIGHT ICA:                        81/24cm/sec CCA:                        81/20cm/sec SYSTOLIC ICA/CCA RATIO:     1.0 DIASTOLIC ICA/CCA RATIO:    1.1 ECA:                        93cm/sec  LEFT ICA:                        333/83cm/sec CCA:                        55/17cm/sec SYSTOLIC ICA/CCA RATIO:     6.05 DIASTOLIC ICA/CCA RATIO:    4.99 ECA:                        184cm/sec  Findings:  RIGHT CAROTID ARTERY: There is mild diffuse irregular partially calcified plaque in the mid and distal common carotid artery, bulb, and proximal ICA without high-grade stenosis.  Normal wave forms and color Doppler signal.  RIGHT VERTEBRAL ARTERY:  Normal flow direction and waveform.  LEFT  CAROTID ARTERY: Moderate eccentric irregular partially calcified plaque in the mid and distal common carotid artery.  More heavily calcified plaque effaces the carotid bulb and extends into the proximal ICA.  There is at least moderate short segment stenosis.  There is focal aliasing just distal to this segment on color Doppler interrogation, with markedly elevated peak systolic velocities.  In the more distal ICA, normal wave forms and color Doppler signal.  LEFT VERTEBRAL ARTERY:  Normal flow direction and waveform.  IMPRESSION:  1.  Bilateral carotid bifurcation and proximal ICA plaque, resulting in 70-99% diameter stenosis on the left, less than 50% diameter stenosis on the  right.  Consider vascular surgical or neurointerventional   radiology consultation.   Original Report Authenticated By: Osa Craver, M.D.     Review of Systems  Constitutional: Negative.   HENT: Negative.   Cardiovascular: Negative.   Gastrointestinal: Negative.   Genitourinary: Negative.   Musculoskeletal: Negative.   Skin: Negative.   Neurological: Positive for tremors and focal weakness.  Endo/Heme/Allergies: Negative.   Psychiatric/Behavioral: Negative.    Blood pressure 89/63, pulse 135, temperature 98.1 F (36.7 C), temperature source Oral, resp. rate 20, height 5\' 4"  (1.626 m), weight 51.619 kg (113 lb 12.8 oz), SpO2 94.00%. Physical Exam GENERAL: This thin pleasant lady who is in no acute distress.  HEENT: Unremarkable.  ABDOMEN: soft  EXTREMITIES: No edema   BACK: Unremarkable.  SKIN: Normal by inspection.    MENTAL STATUS: Alert and oriented. The patient immediately recognized me from previous encounter. Speech, language and cognition are generally intact. Judgment and insight normal.   CRANIAL NERVES: Pupils are equal, round and reactive to light and accomodation; extra ocular movements are full, there is no significant nystagmus; visual fields are full; upper and lower facial muscles are normal in strength and symmetric, there is no flattening of the nasolabial folds; tongue is midline; uvula is midline; shoulder elevation is normal.  MOTOR: Normal tone, bulk and strength In the upper extremities; no pronator drift. She has right leg weakness proximally graded as 4+/5. Distal strength on the right is 5/5. Tone and bulk is normal throughout. Left lower extremity shows normal tone, bulk and strength.  COORDINATION: Left finger to nose is normal, right finger to nose is normal, No rest tremor; no intention tremor; no postural tremor; no bradykinesia.  REFLEXES: Deep tendon reflexes are symmetrical and normal In the upper extremities but diminished 1+ in the  legs. Babinski reflexes are flexor bilaterally.   SENSATION: Normal to light touch..   Assessment/Plan: Possible left posterior circulation TIA. Right LU shaking/seizure. Symptomatic L-ICA high grade stenosis. Suggest semi -urgent vascular surgery consult.  DISCUSSION: The patient presentation is somewhat confusing. She does have bilateral old infarcts which could explain the bilateral spread symptoms. The cross pattern however is worse over possible posterior circulation ischemia. She clearly has symptomatic left ICA stenosis which needs to be adjusted urgently. She has had a clear cortical infarct involving the left frontal lobe. The patient could be having was known a shaking TIA. Additionally, she also could be having small seizures from her old cortical infarct. The EEG is therefore recommended. She is continue with the current anticoagulation and antiplatelet agents for now. Vascular surgery should be consulted for surgery of symptomatic high-grade carotid stenosis. She obviously has to be taken of the antecubital ablation before surgery could be done. MRI/MRAs as suggested above the patient has been very resistant to have this done.   Alucard Fearnow 05/04/2012, 8:39  AM

## 2012-05-05 DIAGNOSIS — I63239 Cerebral infarction due to unspecified occlusion or stenosis of unspecified carotid arteries: Secondary | ICD-10-CM

## 2012-05-05 DIAGNOSIS — G459 Transient cerebral ischemic attack, unspecified: Secondary | ICD-10-CM | POA: Insufficient documentation

## 2012-05-05 DIAGNOSIS — I739 Peripheral vascular disease, unspecified: Secondary | ICD-10-CM

## 2012-05-05 LAB — CBC
HCT: 46.2 % — ABNORMAL HIGH (ref 36.0–46.0)
Hemoglobin: 16.3 g/dL — ABNORMAL HIGH (ref 12.0–15.0)
MCH: 29.8 pg (ref 26.0–34.0)
MCV: 84.5 fL (ref 78.0–100.0)
Platelets: 159 10*3/uL (ref 150–400)
RBC: 5.47 MIL/uL — ABNORMAL HIGH (ref 3.87–5.11)
WBC: 7 10*3/uL (ref 4.0–10.5)

## 2012-05-05 MED ORDER — HEPARIN (PORCINE) IN NACL 100-0.45 UNIT/ML-% IJ SOLN
550.0000 [IU]/h | INTRAMUSCULAR | Status: DC
  Filled 2012-05-05: qty 250

## 2012-05-05 MED ORDER — HEPARIN (PORCINE) IN NACL 100-0.45 UNIT/ML-% IJ SOLN: 550.0000 [IU]/h | INTRAMUSCULAR | Status: DC

## 2012-05-05 NOTE — Progress Notes (Signed)
ANTICOAGULATION CONSULT NOTE - Follow Up Consult  Pharmacy Consult for heparin Indication: CVA with hx of afib  Allergies  Allergen Reactions  . Motrin (Ibuprofen)     Gi upset  . Albuterol     Confusion and shortness of breath  . Amoxicillin Hives  . Cephalexin Hives  . Codeine     confusion  . Entex Pac (Pseudoephedrine-Gg & Dm)   . Etanercept     vertigo  . Fluticasone-Salmeterol     Confusion and shortness of breath  . Gualenic Acid   . Lorazepam     hallucinations  . Nubain (Nalbuphine Hcl)   . Oxycodone Hcl Nausea And Vomiting  . Oxycodone-Acetaminophen Nausea And Vomiting    Patient Measurements: Height: 5\' 4"  (162.6 cm) Weight: 114 lb (51.71 kg) IBW/kg (Calculated) : 54.7  Heparin Dosing Weight: 51.7 kg  Vital Signs:    Labs:  Basename 05/05/12 2319 05/05/12 1100 05/05/12 0200 05/05/12 0151 05/03/12 0645  HGB -- -- 16.3* -- 16.7*  HCT -- -- 46.2* -- 49.0*  PLT -- -- 159 -- 174  APTT -- -- -- -- --  LABPROT -- -- -- -- --  INR -- -- -- -- --  HEPARINUNFRC 0.43 1.01* -- 0.60 --  CREATININE -- -- -- -- 0.63  CKTOTAL -- -- -- -- --  CKMB -- -- -- -- --  TROPONINI -- -- -- -- --    Estimated Creatinine Clearance: 49.6 ml/min (by C-G formula based on Cr of 0.63).   Medications:  Scheduled:    . atorvastatin  80 mg Oral q1800  . sodium chloride  3 mL Intravenous Q12H  . sodium chloride      . tiotropium  18 mcg Inhalation Daily  . verapamil  240 mg Oral Daily   Infusions:    . heparin 550 Units/hr (05/05/12 1515)  . DISCONTD: heparin 15 Units/kg/hr (05/04/12 1625)  . DISCONTD: heparin      Assessment: 77 yo female with CVA and hx of afib is currently on therapeutic heparin.  Heparin level was 0.43 Goal of Therapy:  Heparin level 0.3-0.5 units/ml Monitor platelets by anticoagulation protocol: Yes   Plan:  1) Continue heparin at 550 units/hr 2) Heparin level and CBC in am.  Caroline [name], Caroline [name] 05/05/2012,11:59 PM

## 2012-05-05 NOTE — Progress Notes (Signed)
Event: Post transfer assessment. Subjective: Pt denies c/o's at this time. Reports she is very tired and looking forward to getting some rest. Is able to verbally demonstrate understanding of events over the last 2 to 3 days and understands current plan of care and need for possible surgery on her carotid artery.  Objective: Pt admitted to [facility] on 05/02/2012  S/p several hrs of (R) sided weakness. Sx's felt to be due to acute stroke. Pt w/ known h/o A-Fib on Verapamil and Pradaxa.Carotid studies on 05/03/12 showed significant stenosis in the (L) internal carotid artery. Pt was sent to [facility] for cardiology and vascular surgery consult for same.  Noted pt resting quietly in bed in no acute distress. Is easily aroused and alert and oriented x 3. Most recent VS are, T-97.3, P-66, R-18, 02 sats are 98% on R/A. BBS deminished but otherwise CTA, abd soft, nt w/ normal BS.  MAEx4.  Assessment/Plan: 1. Transfer from [facility] s/p acute stroke 2. (L) carotid artery stenosis (vascular surgery consult pending) 3. Chronic A-Fib/PVD (cardiology consult pending)  Leanne Chang, NP-C [facility] Pager [phone]

## 2012-05-05 NOTE — Progress Notes (Addendum)
Subjective: Was transferred from [facility] last night for VVS eval for symptomatic L ICA stenosis She feels well, denies any complaints, no weakness/numbness  Physical Exam: Blood pressure 111/67, pulse 72, temperature 97.6 F (36.4 C), temperature source Oral, resp. rate 18, height 5\' 4"  (1.626 m), weight 51.71 kg (114 lb), SpO2 97.00%. GEn: AAOx3, no distress HEENT: PERRLA, L carotid bruit CVS: S1S2/RRR, no m/r/g Lungs: CTAB Abd: soft, NT, BS present Ext: no edema/c/c Neuro: no focal signs currently   Investigations:  No results found for this or any previous visit (from the past 240 hour(s)).   Basic Metabolic Panel:  Basename 05/03/12 0645 05/02/12 1940  NA 139 135  K 3.5 3.4*  CL 97 94*  CO2 35* 35*  GLUCOSE 92 83  BUN 11 16  CREATININE 0.63 0.82  CALCIUM 9.2 8.9  MG -- --  PHOS -- --   Liver Function Tests:  [facility] 05/02/12 1940  AST 17  ALT 17  ALKPHOS 85  BILITOT 0.3  PROT 5.9*  ALBUMIN 3.4*     CBC:  Basename 05/05/12 0200 05/03/12 0645 05/02/12 1940  WBC 7.0 6.2 --  NEUTROABS -- -- 4.9  HGB 16.3* 16.7* --  HCT 46.2* 49.0* --  MCV 84.5 86.4 --  PLT 159 174 --    Mr Brain Wo Contrast  05/04/2012  *RADIOLOGY REPORT*  Clinical Data: 77 year old female confusion, weakness, possible seizure.  MRI HEAD WITHOUT CONTRAST  Technique:  Multiplanar, multiecho pulse sequences of the brain and surrounding structures were obtained according to standard protocol without intravenous contrast.  Comparison: Head CTs 05/02/2012 and earlier.  Findings: No restricted diffusion to suggest acute infarction.  No midline shift, mass effect, evidence of mass lesion, ventriculomegaly, extra-axial collection or acute intracranial hemorrhage.  Cervicomedullary junction and pituitary are within normal limits.  Major intracranial vascular flow voids are preserved.  Small chronic cortically based infarct in the left superior frontal gyrus motor strip (series 6 image 20).   Other nonspecific mild for age scattered white matter T2 and FLAIR hyperintensity.  There is evidence of a chronic lacunar infarct in the right thalamus. Brainstem, cerebellum, and visible cervical spine are within normal limits for age.  Normal marrow signal.  Visualized orbit soft tissues are within normal limits.  Paranasal sinuses are clear.  Mild right mastoid fluid.  Trace retained secretions in the nasopharynx.  Clear left mastoids.  Negative scalp soft tissues.  IMPRESSION: 1. No acute intracranial abnormality. 2.  Chronic small and medium-sized vessel ischemia. 3.  Mild right mastoid effusion.   Original Report Authenticated By: Harley Hallmark, M.D.    US Carotid Duplex Bilateral  05/03/2012  *RADIOLOGY REPORT*  Clinical Data: Carotid stenosis, hypertension, cerebrovascular disease.  BILATERAL CAROTID DUPLEX ULTRASOUND  Technique: Wallace Cullens scale imaging, color Doppler and duplex ultrasound was performed of bilateral carotid and vertebral arteries in the neck.  Comparison: None available  Criteria:  Quantification of carotid stenosis is based on velocity parameters that correlate the residual internal carotid diameter with NASCET-based stenosis levels, using the diameter of the distal internal carotid lumen as the denominator for stenosis measurement.  The following velocity measurements were obtained:                   PEAK SYSTOLIC/END DIASTOLIC RIGHT ICA:                        81/24cm/sec CCA:  81/20cm/sec SYSTOLIC ICA/CCA RATIO:     1.0 DIASTOLIC ICA/CCA RATIO:    1.1 ECA:                        93cm/sec  LEFT ICA:                        333/83cm/sec CCA:                        55/17cm/sec SYSTOLIC ICA/CCA RATIO:     6.05 DIASTOLIC ICA/CCA RATIO:    4.99 ECA:                        184cm/sec  Findings:  RIGHT CAROTID ARTERY: There is mild diffuse irregular partially calcified plaque in the mid and distal common carotid artery, bulb, and proximal ICA without high-grade stenosis.   Normal wave forms and color Doppler signal.  RIGHT VERTEBRAL ARTERY:  Normal flow direction and waveform.  LEFT CAROTID ARTERY: Moderate eccentric irregular partially calcified plaque in the mid and distal common carotid artery.  More heavily calcified plaque effaces the carotid bulb and extends into the proximal ICA.  There is at least moderate short segment stenosis.  There is focal aliasing just distal to this segment on color Doppler interrogation, with markedly elevated peak systolic velocities.  In the more distal ICA, normal wave forms and color Doppler signal.  LEFT VERTEBRAL ARTERY:  Normal flow direction and waveform.  IMPRESSION:  1.  Bilateral carotid bifurcation and proximal ICA plaque, resulting in 70-99% diameter stenosis on the left, less than 50% diameter stenosis on the right.  Consider vascular surgical or neurointerventional   radiology consultation.   Original Report Authenticated By: Osa Craver, M.D.       Medications: I have reviewed the patient's current medications.  Impression: 1. Probable seizure affecting right arm, EEG negative 2. L brain TIA, MRI negative 3. LICA Stenosis 60-79% 3. Chronic atrial fibrillation, on anticoagulation with Pradaxa. 4. Peripheral vascular disease. On Pletal. 5. Ongoing tobacco abuse.     Plan:  1. Hold Pradaxa and Pletal, was started on IV heparin at [facility] in the interim, pending VVS eval 2. I have requested VVS consult 3. H/o PAD, stents reportedly was done by a Surgeon in [place]  DNR   LOS: 3 days   [facility] Pager [phone] 05/05/2012, 8:56 AM

## 2012-05-05 NOTE — Progress Notes (Signed)
SLP Screen Note  Patient Details Name: Caroline [name] MRN: [id number] DOB: October 27, 1934  Screen:     Order for speech and language evaluation received.  Pt spoken to at bedside with family present. Pt assessed facial function in mirror and reports left facial droop has resolved.  Pt with clear speech, fluent, intact language and ability to recall events (medical - right arm "lifting", MRI completion, h/o TIA).   Dedicated SLE not indicated.  Pt and family agree.  Please reorder if desire.   Donavan Burnet, MS [facility] SLP [phone]    Chales Abrahams 05/05/2012, 12:03 PM

## 2012-05-05 NOTE — Evaluation (Signed)
Occupational Therapy Evaluation Patient Details Name: Caroline [name] MRN: [id number] DOB: 04/03/1935 Today's Date: 05/05/2012 Time: 0865-7846 OT Time Calculation (min): 22 min  OT Assessment / Plan / Recommendation Clinical Impression  Pt presents to OT s/p admission with CVA.  Pt feels she is basically back to baseline, but will benefit from 1-2 more skilled Ot visits to increase safety in pts bathroom.    OT Assessment  Patient needs continued OT Services    Follow Up Recommendations  No OT follow up       Equipment Recommendations  Other (comment) (family will obtain a tub bench)          Precautions / Restrictions Precautions Precautions: Fall Restrictions Weight Bearing Restrictions: No       ADL  Grooming: Simulated;Set up Where Assessed - Grooming: Unsupported sitting Upper Body Bathing: Simulated;Min guard Where Assessed - Upper Body Bathing: Unsupported sitting Lower Body Bathing: Min guard;Simulated Where Assessed - Lower Body Bathing: Unsupported sit to stand Upper Body Dressing: Simulated;Set up Where Assessed - Upper Body Dressing: Unsupported sitting Lower Body Dressing: Simulated;Min guard Where Assessed - Lower Body Dressing: Unsupported sit to stand Toilet Transfer: Simulated;Min guard Toilet Transfer Method: Sit to stand Toileting - Clothing Manipulation and Hygiene: Simulated;Min guard Where Assessed - Engineer, mining and Hygiene: Standing Tub/Shower Transfer Method: Not assessed Transfers/Ambulation Related to ADLs: Had conversation with pt and family regarding tub safety. Pt will benefit from a tub bench,. Pt and family agree.  Family will obtain tub bench from local medical supply store. Will provide tub bench education for pt next OT visit.    OT Diagnosis: Generalized weakness  OT Problem List: Decreased strength;Decreased activity tolerance OT Treatment Interventions: Self-care/ADL training;Patient/family education;DME and/or  AE instruction   OT Goals Acute Rehab OT Goals OT Goal Formulation: With patient ADL Goals Pt Will Perform Tub/Shower Transfer: Tub transfer;Transfer tub bench ADL Goal: Tub/Shower Transfer - Progress: Goal set today  Visit Information  Last OT Received On: 05/05/12 Assistance Needed: +1    Subjective Data  Subjective: i feel like i am back to normal- just a tad weak   Prior Functioning     Home Living Lives With: Spouse Available Help at Discharge: Family;Available PRN/intermittently Type of Home: House Home Access: Stairs to enter [employer] of Steps: 5-6 Entrance Stairs-Rails: Can reach both;Right;Left Home Layout: One level Bathroom Shower/Tub: Engineer, manufacturing systems: Handicapped height Home Adaptive Equipment: Grab bars in shower;Grab bars around toilet;Shower chair with back;Walker - rolling;Straight cane Additional Comments: Husband is available 24 hours for supervision however has his own impairments and uses his RW, pt reports he would fall faster than she would and he doesn't listen to any advice; pt wants to put in a walk in shower because she reports she almost fell getting into her tub recently  Prior Function Level of Independence: Independent Able to Take Stairs?: Yes Driving: Yes Vocation: Retired Musician: No difficulties            Cognition  Overall Cognitive Status: Appears within functional limits for tasks assessed/performed Arousal/Alertness: Awake/alert Orientation Level: Appears intact for tasks assessed Behavior During Session: [facility] for tasks performed    Extremity/Trunk Assessment Right Upper Extremity Assessment RUE ROM/Strength/Tone: WFL for tasks assessed RUE Sensation: WFL - Light Touch RUE Coordination: WFL - gross/fine motor Left Upper Extremity Assessment LUE ROM/Strength/Tone: WFL for tasks assessed LUE Sensation: WFL - Light Touch LUE Coordination: WFL - gross/fine motor Right Lower  Extremity Assessment RLE ROM/Strength/Tone: [facility]  for tasks assessed RLE Sensation: WFL - Light Touch RLE Coordination: WFL - gross/fine motor Left Lower Extremity Assessment LLE ROM/Strength/Tone: WFL for tasks assessed LLE Sensation: WFL - Light Touch LLE Coordination: WFL - gross/fine motor Trunk Assessment Trunk Assessment: Normal     Mobility Bed Mobility Bed Mobility: Supine to Sit Supine to Sit: 5: Supervision Details for Bed Mobility Assistance: pt sitting EOB on presentation Transfers Transfers: Sit to Stand;Stand to Sit Sit to Stand: 4: Min guard Stand to Sit: 4: Min guard Details for Transfer Assistance: cues for hand placement, close gaurding in standing until pt able to find her balance           Balance Balance Balance Assessed: Yes Static Standing Balance Static Standing - Balance Support: No upper extremity supported Static Standing - Level of Assistance: 4: Min assist Single Leg Stance - Right Leg: 1    End of Session OT - End of Session Activity Tolerance: Patient tolerated treatment well Patient left: in bed;Other (comment) (sitting EOB )  GO     Toshiyuki Fredell, [place]ung 05/05/2012, 1:57 PM

## 2012-05-05 NOTE — Consult Note (Addendum)
[facility] CONSULT NOTE 05/05/2012 DOB: May 05, 2035 MRN : [id number]  CC: Left brain TIA, previous CVA Referring Physician: Zannie Cove, MD  History of Present Illness: Caroline [name] is a 77 y.o. female with hx of HTN, PAD (S/P SFA stent on left), COPD - 0.5-1ppd smoker for many years, previous CVA with minimal residual Right sided weakness. Pt was admitted on 05/02/12 with symptoms of left facial and eyelid droop which lasted several minutes. She also had symptoms of right arm tremor and disassociation " my arm was lifting to the side". Family noted she was throwing out her right leg when she was walking. She denied weakness or clumsiness on the right at this time. She denies symptoms of amaurosis fugax or monocular blindness. Pt is RHD. Pt has had resolution of right sided weakness but still has mild facial and eyelid droop  Pt states Dr. Antoine Poche has been following known left ICA stenosis and there was significant change in velocities on 12/18/11 to the high end of 60 - 79% stenosis. The PSV in the Left ICA bulb was 378 and the EDV 114. In 2012 these velocities were in the 40-59% range.  Pt also states that she was seen by Dr Corliss Skains for cerebral aneurysm      Past Medical History  Diagnosis Date  . Hypertension   . GERD (gastroesophageal reflux disease)   . Cerebrovascular disease   . Peripheral vascular disease     40-59% left carotid stenosis, 0-39% right  . PAD (peripheral artery disease)     lower extremity PAD with bilateral leg stenting  . TIA (transient ischemic attack)   . COPD (chronic obstructive pulmonary disease)   . CVA 10/25/2008    Qualifier: Diagnosis of  By: Marciano Sequin      Past Surgical History  Procedure Date  . Appendectomy   . Partial hysterectomy   . Gallbladder surgery   . Cholecystectomy      ROS: [x]  Positive  [ ]  Denies    General: [ ]  Weight loss, [ ]  Fever, [ ]  chills Neurologic: [ ]  Dizziness, [ ]   Blackouts, [ ]  Seizure [x ] Stroke, [ ]  "Mini stroke", [ ]  Slurred speech, [ ]  Temporary blindness; [x ] weakness in arms or legs, [ ]  Hoarseness Cardiac: [ ]  Chest pain/pressure, [ ]  Shortness of breath at rest [ ]  Shortness of breath with exertion, [x ] Atrial fibrillation or irregular heartbeat Vascular: [x ] Pain in legs with walking, [ ]  Pain in legs at rest, [ ]  Pain in legs at night,  [ ]  Non-healing ulcer, [ ]  Blood clot in vein/DVT,   Pulmonary: [ ]  Home oxygen, [ ]  Productive cough, [ ]  Coughing up blood, [ ]  Asthma,  [ ]  Wheezing Musculoskeletal:  [ x] Arthritis, [x ] Low back pain, [ ]  Joint pain Hematologic: [ ]  Easy Bruising, [ ]  Anemia; [ ]  Hepatitis Gastrointestinal: [ ]  Blood in stool, [x ] Gastroesophageal Reflux/heartburn, [ ]  Trouble swallowing Urinary: [ ]  chronic Kidney disease, [ ]  on HD - [ ]  MWF or [ ]  TTHS, [ ]  Burning with urination, [ ]  Difficulty urinating Skin: [ ]  Rashes, [ ]  Wounds Psychological: [ ]  Anxiety, [ ]  Depression  Social History History  Substance Use Topics  . Smoking status: Current Every Day Smoker -- 1.0 packs/day  . Smokeless tobacco: Current User  . Alcohol Use: No    Family History History reviewed. No pertinent family history.  Allergies  Allergen Reactions  . Motrin (Ibuprofen)     Gi upset  . Albuterol     Confusion and shortness of breath  . Amoxicillin Hives  . Cephalexin Hives  . Codeine     confusion  . Entex Pac (Pseudoephedrine-Gg & Dm)   . Etanercept     vertigo  . Fluticasone-Salmeterol     Confusion and shortness of breath  . Gualenic Acid   . Lorazepam     hallucinations  . Nubain (Nalbuphine Hcl)   . Oxycodone Hcl Nausea And Vomiting  . Oxycodone-Acetaminophen Nausea And Vomiting    Current Facility-Administered Medications  Medication Dose Route Frequency Provider Last Rate Last Dose  . 0.9 %  sodium chloride infusion  250 mL Intravenous PRN Edsel Petrin, DO      . acetaminophen (TYLENOL) tablet  650 mg  650 mg Oral Q4H PRN Edsel Petrin, DO       Or  . acetaminophen (TYLENOL) suppository 650 mg  650 mg Rectal Q4H PRN Edsel Petrin, DO      . ALPRAZolam Prudy Feeler) tablet 0.25 mg  0.25 mg Oral QHS PRN Edsel Petrin, DO      . ALPRAZolam Prudy Feeler) tablet 1 mg  1 mg Oral Once [name] Singer, MD   1 mg at 05/04/12 1058  . atorvastatin (LIPITOR) tablet 80 mg  80 mg Oral q1800 Nimish C Karilyn Cota, MD   80 mg at 05/04/12 2133  . heparin ADULT infusion 100 units/mL (25000 units/250 mL)  15 Units/kg/hr Intravenous Continuous Nimish C Gosrani, MD 7.5 mL/hr at 05/04/12 1625 15 Units/kg/hr at 05/04/12 1625  . heparin bolus via infusion 1,000 Units  1,000 Units Intravenous Once [name] Singer, MD   1,000 Units at 05/04/12 1626  . ondansetron (ZOFRAN) injection 4 mg  4 mg Intravenous Q6H PRN Edsel Petrin, DO      . senna-docusate (Senokot-S) tablet 1 tablet  1 tablet Oral QHS PRN Edsel Petrin, DO      . sodium chloride 0.9 % injection 3 mL  3 mL Intravenous Q12H Edsel Petrin, DO   3 mL at 05/05/12 1018  . sodium chloride 0.9 % injection 3 mL  3 mL Intravenous PRN Edsel Petrin, DO      . sodium chloride 0.9 % injection        10 mL at 05/04/12 1339  . sodium chloride 0.9 % injection           . tiotropium (SPIRIVA) inhalation capsule 18 mcg  18 mcg Inhalation Daily Edsel Petrin, DO   18 mcg at 05/04/12 0756  . verapamil (CALAN-SR) CR tablet 240 mg  240 mg Oral Daily Edsel Petrin, DO   240 mg at 05/05/12 1018  . DISCONTD: dabigatran (PRADAXA) capsule 150 mg  150 mg Oral Q12H Edsel Petrin, DO   150 mg at 05/03/12 2121     Imaging: Mr Brain Wo Contrast  05/04/2012  *RADIOLOGY REPORT*  Clinical Data: 77 year old female confusion, weakness, possible seizure.  MRI HEAD WITHOUT CONTRAST  Technique:  Multiplanar, multiecho pulse sequences of the brain and surrounding structures were obtained according to standard protocol without intravenous  contrast.  Comparison: Head CTs 05/02/2012 and earlier.  Findings: No restricted diffusion to suggest acute infarction.  No midline shift, mass effect, evidence of mass lesion, ventriculomegaly, extra-axial collection or acute intracranial hemorrhage.  Cervicomedullary junction and pituitary are within normal limits.  Major intracranial vascular flow voids  are preserved.  Small chronic cortically based infarct in the left superior frontal gyrus motor strip (series 6 image 20).  Other nonspecific mild for age scattered white matter T2 and FLAIR hyperintensity.  There is evidence of a chronic lacunar infarct in the right thalamus. Brainstem, cerebellum, and visible cervical spine are within normal limits for age.  Normal marrow signal.  Visualized orbit soft tissues are within normal limits.  Paranasal sinuses are clear.  Mild right mastoid fluid.  Trace retained secretions in the nasopharynx.  Clear left mastoids.  Negative scalp soft tissues.  IMPRESSION: 1. No acute intracranial abnormality. 2.  Chronic small and medium-sized vessel ischemia. 3.  Mild right mastoid effusion.   Original Report Authenticated By: Harley Hallmark, M.D.     Significant Diagnostic Studies: CBC Lab Results  Component Value Date   WBC 7.0 05/05/2012   HGB 16.3* 05/05/2012   HCT 46.2* 05/05/2012   MCV 84.5 05/05/2012   PLT 159 05/05/2012    BMET    Component Value Date/Time   NA 139 05/03/2012 0645   K 3.5 05/03/2012 0645   CL 97 05/03/2012 0645   CO2 35* 05/03/2012 0645   GLUCOSE 92 05/03/2012 0645   BUN 11 05/03/2012 0645   CREATININE 0.63 05/03/2012 0645   CALCIUM 9.2 05/03/2012 0645   GFRNONAA 86* 05/03/2012 0645   GFRAA >90 05/03/2012 0645    COAG Lab Results  Component Value Date   INR 1.30 02/23/2010   INR 2.4* 05/29/2008   No results found for this basename: PTT     Physical Examination BP Readings from Last 3 Encounters:  05/05/12 126/77  04/24/12 118/60  07/15/11 176/96   Temp Readings  from Last 3 Encounters:  05/05/12 97.9 F (36.6 C) Oral  04/23/12 98.3 F (36.8 C) Oral  07/15/11 97.6 F (36.4 C)    SpO2 Readings from Last 3 Encounters:  05/05/12 98%  04/24/12 89%  07/15/11 97%   Pulse Readings from Last 3 Encounters:  05/05/12 84  04/24/12 90  07/15/11 82    General:  WDWN in NAD HENT: WNL Eyes: Pupils unequal but reactive Pulmonary: normal non-labored breathing , without Rales, rhonchi,  wheezing Cardiac: RRR, without  Murmurs, rubs or gallops; No carotid bruits noted Abdomen: soft, NT, no masses Skin: no rashes, ulcers noted Vascular Exam/Pulses: no palpable distal pulses Both legs warm with good sensation and motion Extremities without ischemic changes, no Gangrene , no cellulitis; no open wounds;  Musculoskeletal: no muscle wasting or atrophy  Neurologic: A&O X 3; Appropriate Affect ;  Mild left eyelid and facial droop SENSATION: normal; MOTOR FUNCTION: Pt has good and equal strength in all extremities - 5/5 all extemities Speech is fluent/normal  Non-Invasive Vascular Imaging: Previous study done 12/2011  The PSV in the Left ICA bulb was 378 and the EDV 114. In 2012 these velocities were in the 40-59% range.  05/03/12 PEAK SYSTOLIC/END DIASTOLIC  RIGHT  ICA: 81/24cm/sec  CCA: 81/20cm/sec  SYSTOLIC ICA/CCA RATIO: 1.0  DIASTOLIC ICA/CCA RATIO: 1.1  ECA: 93cm/sec  LEFT  ICA: 333/83cm/sec  CCA: 55/17cm/sec  SYSTOLIC ICA/CCA RATIO: 6.05  DIASTOLIC ICA/CCA RATIO: 4.99  ECA: 184cm/sec    ASSESSMENT/PLAN: Caroline [name] is a 77 y.o. female with HX PAD, CVA and known Left ICA stenosis who was admitted with new symptoms from left brain TIA/CVA. Carotid duplex showed increase in velocities from 1 year ago in to the high end 60-79% with EDV 114.  Per pt she was seen  for cerebral artery aneurysm - no intervention - by Dr Erlene Senters but those records are not in [facility] May need CTA vs a cerebral angiogram to further evaluate Left ICA stenosis as well  as possible cerebral aneurysm Dr. Darrick Penna to evaluate timing and necessity of surgical intervention in this complicated patient   History and exam details as above.  Velocities on duplex are not really suggestive of a high grade stenosis but probably in the 50-70% range.  Stroke could also have been cardiac source.  Pt needs carotid angio for further clarification of anatomy.    Risks benefits possible complications of carotid angio explained to pt and family.  NPO p midnight Consent Stop heparin at midnight.  Fabienne Bruns, MD [facility] [place] Office: [phone] Pager: [phone]    No change overnight.  Carotid angio today  Fabienne Bruns, MD [facility] [place] Office: [phone] Pager: [phone]

## 2012-05-05 NOTE — Progress Notes (Signed)
ANTICOAGULATION CONSULT NOTE - Follow Up Consult  Pharmacy Consult for heparin Indication: CVA with history of afib  Allergies  Allergen Reactions  . Motrin (Ibuprofen)     Gi upset  . Albuterol     Confusion and shortness of breath  . Amoxicillin Hives  . Cephalexin Hives  . Codeine     confusion  . Entex Pac (Pseudoephedrine-Gg & Dm)   . Etanercept     vertigo  . Fluticasone-Salmeterol     Confusion and shortness of breath  . Gualenic Acid   . Lorazepam     hallucinations  . Nubain (Nalbuphine Hcl)   . Oxycodone Hcl Nausea And Vomiting  . Oxycodone-Acetaminophen Nausea And Vomiting    Patient Measurements: Height: 5\' 4"  (162.6 cm) Weight: 114 lb (51.71 kg) IBW/kg (Calculated) : 54.7    Vital Signs: Temp: 97.9 F (36.6 C) (10/29 0958) Temp src: Oral (10/29 0958) BP: 126/77 mmHg (10/29 0958) Pulse Rate: 84  (10/29 0958)  Labs:  Basename 05/05/12 1100 05/05/12 0200 05/05/12 0151 05/03/12 0645 05/02/12 1940  HGB -- 16.3* -- 16.7* --  HCT -- 46.2* -- 49.0* 49.1*  PLT -- 159 -- 174 179  APTT -- -- -- -- --  LABPROT -- -- -- -- --  INR -- -- -- -- --  HEPARINUNFRC 1.01* -- 0.60 -- --  CREATININE -- -- -- 0.63 0.82  CKTOTAL -- -- -- -- --  CKMB -- -- -- -- --  TROPONINI -- -- -- -- --    Estimated Creatinine Clearance: 49.6 ml/min (by C-G formula based on Cr of 0.63).   Medications:  Scheduled:    . atorvastatin  80 mg Oral q1800  . heparin  1,000 Units Intravenous Once  . sodium chloride  3 mL Intravenous Q12H  . sodium chloride      . tiotropium  18 mcg Inhalation Daily  . verapamil  240 mg Oral Daily    Assessment: 77 yo female here from [facility] with probable TIA/CVA and known L ICA stenosis on heparin at 750 units/hr (15 units/kg/hr) and initial heparin level was 0.6 with a follow-up level of 1.06.  Goal of Therapy:  Heparin level 0.3-0.5 units/ml Monitor platelets by anticoagulation protocol: Yes   Plan:  -Hold heparin for 1 hr then decrease  to 550 units/hr. -Heparin level in 8 hrs   Harland German, Pharm D 05/05/2012 1:48 PM

## 2012-05-05 NOTE — Evaluation (Signed)
Physical Therapy Evaluation Patient Details Name: Caroline [name] MRN: [id number] DOB: 20-Sep-1934 Today's Date: 05/05/2012 Time: 4098-1191 PT Time Calculation (min): 25 min  PT Assessment / Plan / Recommendation Clinical Impression  Mrs. Mckellips is 77 y/o female with hx of HTN, PAD (S/P SFA stent on left), COPD - 0.5-1ppd smoker for many years, previous CVA with minimal residual Right sided weakness. Pt was admitted on 05/02/12 with symptoms of left facial and eyelid droop which lasted several minutes. She also had symptoms of right arm tremor and disassociation " my arm was lifting to the side". Family noted she was throwing out her right leg when she was walking.   During physical therapy evaluation today pt reports that these symptoms have resolved however pt presents with generalized weakness and balance impairments affecting her safety at home. Will benefit physical therapy in the acute setting to maxmize safety in prep for d/c home. Rec pt also be using RW for all OOB/mobility.     PT Assessment  Patient needs continued PT services    Follow Up Recommendations  Home health PT;Supervision for mobility/OOB    Does the patient have the potential to tolerate intense rehabilitation      Barriers to Discharge Decreased caregiver support husband can provide supervision only and she reports there is some question for his safety at home    Equipment Recommendations  Tub/shower bench (pt doesn;t appear receptive to this)    Recommendations for Other Services     Frequency Min 4X/week    Precautions / Restrictions Precautions Precautions: Fall Restrictions Weight Bearing Restrictions: No         Mobility  Bed Mobility Bed Mobility: Not assessed Details for Bed Mobility Assistance: pt sitting EOB on presentation Transfers Transfers: Sit to Stand;Stand to Sit Sit to Stand: With upper extremity assist;4: Min guard;From bed Stand to Sit: With upper extremity assist;4: Min guard;To  bed Details for Transfer Assistance: cues for hand placement, close gaurding in standing until pt able to find her balance Ambulation/Gait Ambulation/Gait Assistance: 4: Min guard;4: Min assist Ambulation Distance (Feet): 150 Feet Assistive device: None Ambulation/Gait Assistance Details: min stability assist especially during stance phase on RLE  Gait Pattern: Narrow base of support Gait velocity: slow cautious appearing gait General Gait Details: ambulates on the lateral border of her right foot secondary to a painful plantars wart, during stance phase on RLE pt has tendency to lose her balance and cross over with her left leg to try to catch her balance needing minA to correct and prevent fall Modified Rankin (Stroke Patients Only) Pre-Morbid Rankin Score: Slight disability Modified Rankin: Moderately severe disability              PT Diagnosis: Difficulty walking;Abnormality of gait;Generalized weakness  PT Problem List: Decreased strength;Decreased activity tolerance;Decreased balance;Pain PT Treatment Interventions: DME instruction;Gait training;Stair training;Functional mobility training;Therapeutic activities;Therapeutic exercise;Balance training;Patient/family education;Neuromuscular re-education   PT Goals Acute Rehab PT Goals PT Goal Formulation: With patient Time For Goal Achievement: 05/12/12 Potential to Achieve Goals: Good Pt will go Sit to Stand: with modified independence PT Goal: Sit to Stand - Progress: Goal set today Pt will go Stand to Sit: with modified independence PT Goal: Stand to Sit - Progress: Goal set today Pt will Transfer Bed to Chair/Chair to Bed: with modified independence PT Transfer Goal: Bed to Chair/Chair to Bed - Progress: Goal set today Pt will Ambulate: >150 feet;with modified independence;with least restrictive assistive device PT Goal: Ambulate - Progress: Goal set today Pt  will Go Up / Down Stairs: 3-5 stairs;with modified  independence;with rail(s) PT Goal: Up/Down Stairs - Progress: Goal set today  Visit Information  Last PT Received On: 05/05/12 Assistance Needed: +1    Subjective Data  Subjective: My left eye was drooping and my right arm would just move away from my body and I would have to bring it back. Patient Stated Goal: home   Prior Functioning  Home Living Lives With: Spouse Available Help at Discharge: Family;Available PRN/intermittently Type of Home: House Home Access: Stairs to enter [employer] of Steps: 5-6 Entrance Stairs-Rails: Can reach both;Right;Left Home Layout: One level Bathroom Shower/Tub: Engineer, manufacturing systems: Handicapped height Home Adaptive Equipment: Grab bars in shower;Grab bars around toilet;Shower chair with back;Walker - rolling;Straight cane Additional Comments: Husband is available 24 hours for supervision however has his own impairments and uses his RW, pt reports he would fall faster than she would and he doesn't listen to any advice; pt wants to put in a walk in shower because she reports she almost fell getting into her tub recently  Prior Function Level of Independence: Independent Able to Take Stairs?: Yes Driving: Yes Vocation: Retired Musician: No difficulties    Cognition  Overall Cognitive Status: Appears within functional limits for tasks assessed/performed Arousal/Alertness: Awake/alert Orientation Level: Appears intact for tasks assessed Behavior During Session: [facility] for tasks performed    Extremity/Trunk Assessment Right Upper Extremity Assessment RUE ROM/Strength/Tone: WFL for tasks assessed RUE Sensation: WFL - Light Touch RUE Coordination: WFL - gross/fine motor Left Upper Extremity Assessment LUE ROM/Strength/Tone: WFL for tasks assessed LUE Sensation: WFL - Light Touch LUE Coordination: WFL - gross/fine motor Right Lower Extremity Assessment RLE ROM/Strength/Tone: WFL for tasks assessed RLE  Sensation: WFL - Light Touch RLE Coordination: WFL - gross/fine motor Left Lower Extremity Assessment LLE ROM/Strength/Tone: WFL for tasks assessed LLE Sensation: WFL - Light Touch LLE Coordination: WFL - gross/fine motor Trunk Assessment Trunk Assessment: Normal   Balance Balance Balance Assessed: Yes Static Standing Balance Static Standing - Balance Support: No upper extremity supported Static Standing - Level of Assistance: 4: Min assist Single Leg Stance - Right Leg: 1   End of Session PT - End of Session Equipment Utilized During Treatment: Gait belt Activity Tolerance: Patient tolerated treatment well Patient left: in bed;with call bell/phone within reach;with bed alarm set Nurse Communication: Mobility status  GP     [facility] HELEN 05/05/2012, 12:27 PM

## 2012-05-06 ENCOUNTER — Encounter (HOSPITAL_COMMUNITY)
Admission: AD | Disposition: A | Discharge status: Home Health | Payer: Self-pay | Source: Home / Self Care | Attending: Family Medicine

## 2012-05-06 DIAGNOSIS — G459 Transient cerebral ischemic attack, unspecified: Secondary | ICD-10-CM

## 2012-05-06 DIAGNOSIS — E876 Hypokalemia: Secondary | ICD-10-CM

## 2012-05-06 HISTORY — PX: CAROTID ANGIOGRAM: SHX5504

## 2012-05-06 LAB — BASIC METABOLIC PANEL
Chloride: 100 mEq/L (ref 96–112)
Creatinine, Ser: 0.68 mg/dL (ref 0.50–1.10)
GFR calc Af Amer: >90 mL/min (ref >90)
Sodium: 138 mEq/L (ref 135–145)

## 2012-05-06 LAB — PROTIME-INR: INR: 0.97 (ref 0.00–1.49)

## 2012-05-06 LAB — CBC
HCT: 49.5 % — ABNORMAL HIGH (ref 36.0–46.0)
Hemoglobin: 16.9 g/dL — ABNORMAL HIGH (ref 12.0–15.0)
MCH: 29.8 pg (ref 26.0–34.0)
MCV: 87.3 fL (ref 78.0–100.0)
Platelets: 169 10*3/uL (ref 150–400)
RBC: 5.67 MIL/uL — ABNORMAL HIGH (ref 3.87–5.11)
WBC: 6.2 10*3/uL (ref 4.0–10.5)

## 2012-05-06 LAB — APTT: aPTT: 32 seconds (ref 24–37)

## 2012-05-06 LAB — GLUCOSE, CAPILLARY: Glucose-Capillary: 93 mg/dL (ref 70–99)

## 2012-05-06 SURGERY — CAROTID ANGIOGRAM
Anesthesia: LOCAL | Laterality: Right

## 2012-05-06 MED ORDER — HEPARIN (PORCINE) IN NACL 2-0.9 UNIT/ML-% IJ SOLN
INTRAMUSCULAR | Status: AC
  Filled 2012-05-06: qty 1000

## 2012-05-06 MED ORDER — CLONIDINE HCL 0.2 MG PO TABS
0.2000 mg | ORAL_TABLET | ORAL | Status: DC | PRN
  Filled 2012-05-06: qty 1

## 2012-05-06 MED ORDER — DOCUSATE SODIUM 100 MG PO CAPS
100.0000 mg | ORAL_CAPSULE | Freq: Every day | ORAL | Status: DC
  Administered 2012-05-07 – 2012-05-13 (×6): 100 mg via ORAL
  Filled 2012-05-06 (×5): qty 1

## 2012-05-06 MED ORDER — SODIUM CHLORIDE 0.45 % IV SOLN
INTRAVENOUS | Status: DC
  Administered 2012-05-09 – 2012-05-10 (×3): via INTRAVENOUS
  Administered 2012-05-11: 100 mL/h via INTRAVENOUS
  Administered 2012-05-11 – 2012-05-12 (×2): via INTRAVENOUS

## 2012-05-06 MED ORDER — HYDRALAZINE HCL 20 MG/ML IJ SOLN
INTRAMUSCULAR | Status: AC
  Filled 2012-05-06: qty 1

## 2012-05-06 MED ORDER — ONDANSETRON HCL 4 MG/2ML IJ SOLN: 4.0000 mg | Freq: Four times a day (QID) | INTRAMUSCULAR | Status: DC | PRN

## 2012-05-06 MED ORDER — LIDOCAINE HCL (PF) 1 % IJ SOLN
INTRAMUSCULAR | Status: AC
  Filled 2012-05-06: qty 30

## 2012-05-06 MED ORDER — PHENOL 1.4 % MT LIQD: 1.0000 | OROMUCOSAL | Status: DC | PRN

## 2012-05-06 MED ORDER — HEPARIN (PORCINE) IN NACL 100-0.45 UNIT/ML-% IJ SOLN
700.0000 [IU]/h | INTRAMUSCULAR | Status: DC
  Administered 2012-05-07: 700 [IU]/h via INTRAVENOUS
  Filled 2012-05-06: qty 250

## 2012-05-06 NOTE — Progress Notes (Signed)
PT Cancellation Note  Patient Details Name: Caroline [name] MRN: [id number] DOB: [birth year]   Cancelled Treatment:     Pt currently at cath lab. Will f/u as time and activity orders allow.   [facility] HELEN 05/06/2012, 10:36 AM Pager: [phone]

## 2012-05-06 NOTE — Progress Notes (Signed)
MD called related to patient Left foot feeling cooler compared to Right foot. Pt also complained of numbness and tingling in Left leg. RN and other nurse listened to pulse with doppler's.  Pulse faint on both feet but audibile.  Pt under no s/s distress.

## 2012-05-06 NOTE — Progress Notes (Signed)
Called to see pt for numbness/ pins needles sensation right foot.  Foot is warm, motor sensation intact, no groin hematoma, DP PT doppler. Probably related to back and positional should resolve when bedrest over.  Left CEA on Monday.  Fabienne Bruns, MD [facility] [place] Office: [phone] Pager: [phone]

## 2012-05-06 NOTE — Progress Notes (Signed)
ANTICOAGULATION CONSULT NOTE - Follow Up Consult  Pharmacy Consult for heparin Indication: CVA with hx of afib  Allergies  Allergen Reactions  . Motrin (Ibuprofen)     Gi upset  . Albuterol     Confusion and shortness of breath  . Amoxicillin Hives  . Cephalexin Hives  . Codeine     confusion  . Entex Pac (Pseudoephedrine-Gg & Dm)   . Etanercept     vertigo  . Fluticasone-Salmeterol     Confusion and shortness of breath  . Gualenic Acid   . Lorazepam     hallucinations  . Nubain (Nalbuphine Hcl)   . Oxycodone Hcl Nausea And Vomiting  . Oxycodone-Acetaminophen Nausea And Vomiting    Patient Measurements: Height: 5\' 4"  (162.6 cm) Weight: 114 lb (51.71 kg) IBW/kg (Calculated) : 54.7  Heparin Dosing Weight: 51.7 kg  Vital Signs: Temp: 97.9 F (36.6 C) (10/30 1108) Temp src: Oral (10/30 1108) BP: 121/58 mmHg (10/30 1314) Pulse Rate: 99  (10/30 1314)  Labs:  Basename 05/06/12 0640 05/05/12 2319 05/05/12 1100 05/05/12 0200  HGB 16.9* -- -- 16.3*  HCT 49.5* -- -- 46.2*  PLT 169 -- -- 159  APTT 32 -- -- --  LABPROT 12.8 -- -- --  INR 0.97 -- -- --  HEPARINUNFRC <0.10* 0.43 1.01* --  CREATININE 0.68 -- -- --  CKTOTAL -- -- -- --  CKMB -- -- -- --  TROPONINI -- -- -- --    Estimated Creatinine Clearance: 49.6 ml/min (by C-G formula based on Cr of 0.68).   Medications:  Scheduled:     . atorvastatin  80 mg Oral q1800  . docusate sodium  100 mg Oral Daily  . heparin      . hydrALAZINE      . lidocaine      . sodium chloride  3 mL Intravenous Q12H  . tiotropium  18 mcg Inhalation Daily  . verapamil  240 mg Oral Daily   Infusions:     . sodium chloride 999 mL (05/06/12 1115)  . heparin 550 Units/hr (05/06/12 0000)  . DISCONTD: heparin 15 Units/kg/hr (05/04/12 1625)  . DISCONTD: heparin      Assessment: 77 yo female with CVA and hx of afib. Patient noted with significant L ICA stenosis and noted to CEA on 05/11/12. Heparin to restart at 4pm if no  groin hematoma. Last heparin level was 0.43 on 550 units/hr  Goal of Therapy:  Heparin level 0.3-0.5 units/ml Monitor platelets by anticoagulation protocol: Yes   Plan:  1) Restart heparin at 550 units/hr at 4pm 2) Heparin level and CBC in am.  Harland German, Pharm D 05/06/2012 1:28 PM

## 2012-05-06 NOTE — Progress Notes (Signed)
[facility] PROGRESS NOTE  Caroline [name] HYQ:[id number] DOB: 06-13-1935 DOA: 05/02/2012 PCP: Cassell Smiles., MD  Assessment/Plan: 1. LICA stenosis - VVS on board and managing - continue heparin - avoid pradaxa - Continue statin  2. Hypokalemia -resolved. Last potassium 4.7  3. COPD - Stable at this juncture  4. Atrial fibrillation - Stable on Verapamil - Continue to monitor heart rate and adjust medication pending heart rate while in house.  - on heparin at this juncture.  Code Status: DNR Family Communication: Spoke with patient and daughter in the room Disposition Plan: Pt has L CEA on Nov 4.   Consultants:  VVS: Dr. Darrick Penna  Procedures: Arch aortogram with bilateral carotid angiogram, right iliac angiogram, selective left subclavian angiogram 05/06/12  Antibiotics:  None  HPI/Subjective: Pt has no new complaints at this point.  Objective: Filed Vitals:   05/06/12 1314 05/06/12 1408 05/06/12 1515 05/06/12 1646  BP: 121/58 119/69 110/64 124/78  Pulse: 99 91 90 100  Temp:      TempSrc:      Resp:      Height:      Weight:      SpO2:       No intake or output data in the 24 hours ending 05/06/12 1741 Filed Weights   05/03/12 0422 05/04/12 0700 05/04/12 2000  Weight: 51.7 kg (113 lb 15.7 oz) 51.619 kg (113 lb 12.8 oz) 51.71 kg (114 lb)    Exam:   General:  Pt in NAD, Alert and Awake  Cardiovascular: S1 and S2, no murmurs, Left carotid bruit  Respiratory: CTA BL, no wheezes  Abdomen: soft, NT, ND  Data Reviewed: Basic Metabolic Panel:  Lab 05/06/12 9528 05/03/12 0645 05/02/12 1940  NA 138 139 135  K 4.7 3.5 3.4*  CL 100 97 94*  CO2 33* 35* 35*  GLUCOSE 84 92 83  BUN 10 11 16   CREATININE 0.68 0.63 0.82  CALCIUM 9.2 9.2 8.9  MG -- -- --  PHOS -- -- --   Liver Function Tests:  Lab 05/02/12 1940  AST 17  ALT 17  ALKPHOS 85  BILITOT 0.3  PROT 5.9*  ALBUMIN 3.4*   No results found for this basename: LIPASE:5,AMYLASE:5 in  the last 168 hours No results found for this basename: AMMONIA:5 in the last 168 hours CBC:  Lab 05/06/12 0640 05/05/12 0200 05/03/12 0645 05/02/12 1940  WBC 6.2 7.0 6.2 8.0  NEUTROABS -- -- -- 4.9  HGB 16.9* 16.3* 16.7* 17.1*  HCT 49.5* 46.2* 49.0* 49.1*  MCV 87.3 84.5 86.4 86.3  PLT 169 159 174 179   Cardiac Enzymes: No results found for this basename: CKTOTAL:5,CKMB:5,CKMBINDEX:5,TROPONINI:5 in the last 168 hours BNP (last 3 results) No results found for this basename: PROBNP:3 in the last 8760 hours CBG:  Lab 05/06/12 1011  GLUCAP 93    No results found for this or any previous visit (from the past 240 hour(s)).   Studies: No results found.  Scheduled Meds:   . atorvastatin  80 mg Oral q1800  . docusate sodium  100 mg Oral Daily  . heparin      . hydrALAZINE      . lidocaine      . sodium chloride  3 mL Intravenous Q12H  . tiotropium  18 mcg Inhalation Daily  . verapamil  240 mg Oral Daily   Continuous Infusions:   . sodium chloride 999 mL (05/06/12 1115)  . heparin 550 Units/hr (05/06/12 1615)  . DISCONTD: heparin  550 Units/hr (05/06/12 0000)    Principal Problem:  *Stroke Active Problems:  TOBACCO ABUSE  FIBRILLATION, ATRIAL  CAROTID ARTERY DISEASE  PVD  Hypokalemia  COPD (chronic obstructive pulmonary disease)  Hemiballismus  TIA (transient ischemic attack)    Time spent: > 35 minutes    Penny Pia  [facility] Pager [phone]. If 8PM-8AM, please contact night-coverage at [email or web address], password [facility] 05/06/2012, 5:41 PM  LOS: 4 days

## 2012-05-06 NOTE — Op Note (Signed)
Procedure: Arch aortogram with bilateral carotid angiogram, right iliac angiogram, selective left subclavian angiogram  Preoperative diagnosis: High grade left internal carotid artery stenosis  Postoperative diagnosis: Same  Anesthesia: Local   Operative details: After obtaining informed consent, the patient was taken to the PV lab. The patient was placed in supine position the Angio table. Both groins were prepped and draped in usual sterile fashion. Local anesthesia was infiltrated over the right common femoral artery. An introducer needle was placed into the right common femoral artery using ultrasound guidance after an initial failed stick on palpation alone.  There was good backbleeding from this during the ultrasound puncture although the anterior wall of the artery was calcified.  Next and 0.035 Versacore wire was threaded up into the abdominal aorta and into the right common iliac under fluoroscopic guidance.  The wire would not pass easily into the abdominal aorta.   A 5 French sheath was then placed over the guidewire into the right common femoral artery. This was thoroughly flushed with heparinized saline. A 5 French H1 catheter was then placed over the guidewire and advanced into the right common iliac.  A contrast injection was performed which showed calcification of the right common iliac origin but minimal stenosis.   With some manipulation the 035 Versacore wire was then advanced into the abdominal aorta and up in the aortic arch.  The H1 cathter was replaced with a pigtail catheter and an arch aortogram obtained in a 40 LAO view. This shows aberrant arch configuration.  There is a common trunk for the left and right common carotid arteries.  These are both patent with no narrowing.  The aortic arch is heavily calcified.  The next branch off the arch is the right subclavian artery followed by the left subclavian artery but there is overlap of the origin.   with occlusion of the right  subclavian artery. The right vertebral artery is patent with antegrade flow and is dominant it comes off normal position right subclavian artery. The H1 catheter was brought back onto the field and used to selectively cathterize the right common carotid over the versacore wire. The right common carotid artery is patent. The right internal and external carotid arteries are patent with no significant narrowing.  The external is small.Projections were done in AP and lateral projection. Intracranial views were also performed to be interpreted later by the neuroradiologist.   Next the H1 catheter was pulled back over guidewire and exchanged for a Berenstein 90 degree catheter.  This was used to select the left common carotid artery. The [place] was then exchanged over the guidewire for a 5 fr slip straight catheter.   After advancing the catheter into the left common carotid artery left carotid injection was performed also in AP and lateral projection. Intracranial views were also performed in AP and lateral projection to be interpreted by the neuroradiologist. On the left side there is heavy calcific plaque at the left carotid bifurcation with a >90% stenosis of the internal carotid artery. The carotid bifurcation is a few cm below the angle of the mandible.   The left internal and external carotid arteries are otherwise patent.  Next, the slip catheter was pulled back over the guidewire and out. The H1 catheter was then advanced back over the wire to select the left subclavian artery to determine if there was a common trunk.  This was performed in about 20 degree RAO.  The right subclavian is widely patent with a widely patent vertebral.  However the origin was still not delineated.  The H1 was removed and exchanged for the 5 Fr pigtail.  This was advanced to the subclavian origin in the arch.  The same angle was used and this shows separate subclavian origins with no stenosis.  The left subclavian comes off  prior to the right.  The right vertebral artery is small than the left but patent.  The pigtail catheter was removed over a guidewire.  The 5 French sheath was thoroughly flushed with heparinized saline. The patient was taken to the holding area in stable condition.  The patient tolerated the procedure well and there were no neurologic complications. The patient was transported to the holding area in stable condition.   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  Vertebrals come off normal position. Heavily calcified aortic arch    #2  >90% stenosis left internal carotid artery                #3 no significant right internal carotid artery stenosis   Disposition: The patient will be scheduled for left carotid endarterectomy Monday November 4.  Please keep her on heparin until then.  Do not restart Pradaxa.  Fabienne Bruns, MD  [facility] [place]  Office: [phone]  Pager: [phone]

## 2012-05-07 MED ORDER — HEPARIN (PORCINE) IN NACL 100-0.45 UNIT/ML-% IJ SOLN
850.0000 [IU]/h | INTRAMUSCULAR | Status: DC
  Administered 2012-05-08 – 2012-05-10 (×3): 850 [IU]/h via INTRAVENOUS
  Filled 2012-05-07 (×5): qty 250

## 2012-05-07 NOTE — Progress Notes (Signed)
ANTICOAGULATION CONSULT NOTE - Follow Up Consult  Pharmacy Consult for heparin Indication: CVA with hx of afib  Allergies  Allergen Reactions  . Motrin (Ibuprofen)     Gi upset  . Albuterol     Confusion and shortness of breath  . Amoxicillin Hives  . Cephalexin Hives  . Codeine     confusion  . Entex Pac (Pseudoephedrine-Gg & Dm)   . Etanercept     vertigo  . Fluticasone-Salmeterol     Confusion and shortness of breath  . Gualenic Acid   . Lorazepam     hallucinations  . Nubain (Nalbuphine Hcl)   . Oxycodone Hcl Nausea And Vomiting  . Oxycodone-Acetaminophen Nausea And Vomiting    Patient Measurements: Height: 5\' 4"  (162.6 cm) Weight: 114 lb (51.71 kg) IBW/kg (Calculated) : 54.7  Heparin Dosing Weight: 51.7 kg  Vital Signs: Temp: 98 F (36.7 C) (10/30 2143) Temp src: Oral (10/30 2143) BP: 135/80 mmHg (10/30 2143) Pulse Rate: 88  (10/30 2143)  Labs:  Basename 05/07/12 0041 05/06/12 0640 05/05/12 2319 05/05/12 0200  HGB -- 16.9* -- 16.3*  HCT -- 49.5* -- 46.2*  PLT -- 169 -- 159  APTT -- 32 -- --  LABPROT -- 12.8 -- --  INR -- 0.97 -- --  HEPARINUNFRC 0.18* <0.10* 0.43 --  CREATININE -- 0.68 -- --  CKTOTAL -- -- -- --  CKMB -- -- -- --  TROPONINI -- -- -- --    Estimated Creatinine Clearance: 49.6 ml/min (by C-G formula based on Cr of 0.68).   Medications:  Scheduled:     . atorvastatin  80 mg Oral q1800  . docusate sodium  100 mg Oral Daily  . heparin      . hydrALAZINE      . lidocaine      . sodium chloride  3 mL Intravenous Q12H  . tiotropium  18 mcg Inhalation Daily  . verapamil  240 mg Oral Daily   Infusions:     . sodium chloride 999 mL (05/06/12 1115)  . heparin 550 Units/hr (05/06/12 1615)  . DISCONTD: heparin 550 Units/hr (05/06/12 0000)    Assessment: 77 yo female with CVA and hx of afib. Patient noted with significant L ICA stenosis and noted for CEA on 05/11/12. Plan for heparin until CEA. Heparin level (0.18) is below-goal  on 550 units/hr. No problem with line / infusion per RN.   Goal of Therapy:  Heparin level 0.3-0.5 units/ml Monitor platelets by anticoagulation protocol: Yes   Plan:  1. Increase IV heparin to 700 units/hr. 2. Heparin level in 8 hours.   Lorre Munroe, PharmD  05/07/2012 1:41 AM

## 2012-05-07 NOTE — Progress Notes (Signed)
Physical Therapy Treatment Patient Details Name: Caroline [name] MRN: [id number] DOB: 1935-03-30 Today's Date: 05/07/2012 Time: 7829-5621 PT Time Calculation (min): 26 min  PT Assessment / Plan / Recommendation Comments on Treatment Session  Pt. presented to be eager to get out of bed and to go for a walk. Pt. states that she enjoyed being out of bed and able to walk around for a bit.   Ambulated with RW today due to pt stating although she feels her Rt side is stronger, she just feels generally weak from being immobile while in hospital.  Pt. needed some VC's for staying in the walker while ambulating and during transfers for hand placement.     Follow Up Recommendations  Home health PT;Supervision for mobility/OOB     Does the patient have the potential to tolerate intense rehabilitation     Barriers to Discharge        Equipment Recommendations  None recommended by OT    Recommendations for Other Services    Frequency Min 4X/week   Plan      Precautions / Restrictions Precautions Precautions: Fall Restrictions Weight Bearing Restrictions: No   Pertinent Vitals/Pain Pt. Reports no pain during PT session.    Mobility  Bed Mobility Bed Mobility: Sitting - Scoot to Edge of Bed Sitting - Scoot to Edge of Bed: 5: Supervision Details for Bed Mobility Assistance: Pt. was at EOB upon PT arrival to room. Transfers Transfers: Sit to Stand;Stand to Sit Sit to Stand: 5: Supervision;With upper extremity assist;With armrests;From bed;From toilet Stand to Sit: 5: Supervision;With upper extremity assist;With armrests;To chair/3-in-1;To toilet Details for Transfer Assistance: VC's for handplacement upon standing from the EOB to not get tangled in IV lines and for rising from toilet. Ambulation/Gait Ambulation/Gait Assistance: 4: Min guard;5: Supervision Ambulation Distance (Feet): 250 Feet Assistive device: Rolling walker Ambulation/Gait Assistance Details: Pt. began ambulation as min  guard for safety and prgressed to supervision as she got familiar with the RW. Per pt. she had not used a RW since her "original stroke". Pt. able to ambulate making head turns. starting/stopping on cue, and with speed changes. Gait Pattern: Step-through pattern;Narrow base of support Gait velocity: slow without cue General Gait Details: Pt. states that she feels some discomfort on the bottom of her Rt foot while she is ambulating, but states this is not a new thing since coming to [facility].  Stairs: No Wheelchair Mobility Wheelchair Mobility: No       PT Goals Acute Rehab PT Goals PT Goal Formulation: With patient Time For Goal Achievement: 05/12/12 Potential to Achieve Goals: Good Pt will go Sit to Stand: with modified independence PT Goal: Sit to Stand - Progress: Progressing toward goal Pt will go Stand to Sit: with modified independence PT Goal: Stand to Sit - Progress: Progressing toward goal Pt will Transfer Bed to Chair/Chair to Bed: with modified independence PT Transfer Goal: Bed to Chair/Chair to Bed - Progress: Progressing toward goal Pt will Ambulate: >150 feet;with modified independence;with least restrictive assistive device PT Goal: Ambulate - Progress: Progressing toward goal Pt will Go Up / Down Stairs: 3-5 stairs;with modified independence;with rail(s)  Visit Information  Last PT Received On: 05/07/12 Assistance Needed: +1    Subjective Data  Subjective: "I hate this IV pole" Patient Stated Goal: home   Cognition  Overall Cognitive Status: Appears within functional limits for tasks assessed/performed Arousal/Alertness: Awake/alert Orientation Level: Appears intact for tasks assessed Behavior During Session: [facility] for tasks performed    Balance  Balance Balance Assessed: No  End of Session PT - End of Session Equipment Utilized During Treatment: Gait belt Activity Tolerance: Patient tolerated treatment well Patient left: in chair;with call bell/phone within  reach Nurse Communication: Mobility status (Pt's concers for IV dripping)     Caroline [name] , SPTA 05/07/2012, 2:13 PM   Caroline [name], PTA [phone] 05/07/2012

## 2012-05-07 NOTE — Progress Notes (Signed)
ANTICOAGULATION CONSULT NOTE - Follow Up Consult  Pharmacy Consult for heparin Indication: CVA with hx of afib  Allergies  Allergen Reactions  . Motrin (Ibuprofen)     Gi upset  . Albuterol     Confusion and shortness of breath  . Amoxicillin Hives  . Cephalexin Hives  . Codeine     confusion  . Entex Pac (Pseudoephedrine-Gg & Dm)   . Etanercept     vertigo  . Fluticasone-Salmeterol     Confusion and shortness of breath  . Gualenic Acid   . Lorazepam     hallucinations  . Nubain (Nalbuphine Hcl)   . Oxycodone Hcl Nausea And Vomiting  . Oxycodone-Acetaminophen Nausea And Vomiting    Patient Measurements: Height: 5\' 4"  (162.6 cm) Weight: 114 lb (51.71 kg) IBW/kg (Calculated) : 54.7  Heparin Dosing Weight: 51.7 kg  Vital Signs: Temp: 97.4 F (36.3 C) (10/31 0943) Temp src: Oral (10/31 0943) BP: 106/62 mmHg (10/31 0943) Pulse Rate: 87  (10/31 0943)  Labs:  Basename 05/07/12 0935 05/07/12 0041 05/06/12 0640 05/05/12 0200  HGB -- -- 16.9* 16.3*  HCT -- -- 49.5* 46.2*  PLT -- -- 169 159  APTT -- -- 32 --  LABPROT -- -- 12.8 --  INR -- -- 0.97 --  HEPARINUNFRC 0.14* 0.18* <0.10* --  CREATININE -- -- 0.68 --  CKTOTAL -- -- -- --  CKMB -- -- -- --  TROPONINI -- -- -- --    Estimated Creatinine Clearance: 49.6 ml/min (by C-G formula based on Cr of 0.68).   Medications:  Scheduled:     . atorvastatin  80 mg Oral q1800  . docusate sodium  100 mg Oral Daily  . sodium chloride  3 mL Intravenous Q12H  . tiotropium  18 mcg Inhalation Daily  . verapamil  240 mg Oral Daily   Infusions:     . sodium chloride 999 mL (05/06/12 1115)  . heparin    . DISCONTD: heparin 550 Units/hr (05/06/12 0000)  . DISCONTD: heparin 700 Units/hr (05/07/12 0151)    Assessment: 77 yo female with CVA and hx of afib. Patient noted with significant L ICA stenosis and noted for CEA on 05/11/12. Plan for heparin until CEA. Heparin level (0.14) is below-goal on 700 units/hr. No problem  with line / infusion per RN. Some minor bleeding at IV site per RN but this is not a new problem. MD has already assessed.   Goal of Therapy:  Heparin level 0.3-0.5 units/ml Monitor platelets by anticoagulation protocol: Yes   Plan:  1. Increase IV heparin to 850 units/hr. 2. Heparin level in 8 hours.  3. Continue daily heparin level and CBC  Lysle Pearl, PharmD, BCPS Pager # [phone] 05/07/2012 10:59 AM

## 2012-05-07 NOTE — Progress Notes (Signed)
[facility] PROGRESS NOTE  Caroline [name] ZOX:[id number] DOB: Mar 26, 1935 DOA: 05/02/2012 PCP: Cassell Smiles., MD  Assessment/Plan: 1. LICA stenosis - VVS on board and managing - continue heparin - avoid pradaxa - Continue statin  2. Hypokalemia -resolved. Last potassium 4.7  3. COPD - Stable at this juncture  4. Atrial fibrillation - Stable on Verapamil - Continue to monitor heart rate and adjust medication pending heart rate while in house.  - on heparin at this juncture.  Code Status: DNR Family Communication: Spoke with patient and daughter in the room Disposition Plan: Pt has L CEA on Nov 4.   Consultants:  VVS: Dr. Darrick Penna  Procedures: Arch aortogram with bilateral carotid angiogram, right iliac angiogram, selective left subclavian angiogram 05/06/12  Antibiotics:  None  HPI/Subjective: Pt has no new complaints at this point.  Objective: Filed Vitals:   05/07/12 0700 05/07/12 0943 05/07/12 1100 05/07/12 1402  BP: 126/66 106/62 119/59 94/58  Pulse: 98 87 86 66  Temp: 97.5 F (36.4 C) 97.4 F (36.3 C) 97.4 F (36.3 C) 97.6 F (36.4 C)  TempSrc: Oral Oral Oral Oral  Resp: 16 18 16 18   Height:      Weight:      SpO2: 90% 99% 99% 100%    Intake/Output Summary (Last 24 hours) at 05/07/12 1452 Last data filed at 05/07/12 1300  Gross per 24 hour  Intake      0 ml  Output    300 ml  Net   -300 ml   Filed Weights   05/03/12 0422 05/04/12 0700 05/04/12 2000  Weight: 51.7 kg (113 lb 15.7 oz) 51.619 kg (113 lb 12.8 oz) 51.71 kg (114 lb)    Exam:   General:  Pt in NAD, Alert and Awake  Cardiovascular: S1 and S2, no murmurs, Left carotid bruit  Respiratory: CTA BL, no wheezes  Abdomen: soft, NT, ND  Data Reviewed: Basic Metabolic Panel:  Lab 05/06/12 8119 05/03/12 0645 05/02/12 1940  NA 138 139 135  K 4.7 3.5 3.4*  CL 100 97 94*  CO2 33* 35* 35*  GLUCOSE 84 92 83  BUN 10 11 16   CREATININE 0.68 0.63 0.82  CALCIUM 9.2 9.2 8.9    MG -- -- --  PHOS -- -- --   Liver Function Tests:  Lab 05/02/12 1940  AST 17  ALT 17  ALKPHOS 85  BILITOT 0.3  PROT 5.9*  ALBUMIN 3.4*   No results found for this basename: LIPASE:5,AMYLASE:5 in the last 168 hours No results found for this basename: AMMONIA:5 in the last 168 hours CBC:  Lab 05/06/12 0640 05/05/12 0200 05/03/12 0645 05/02/12 1940  WBC 6.2 7.0 6.2 8.0  NEUTROABS -- -- -- 4.9  HGB 16.9* 16.3* 16.7* 17.1*  HCT 49.5* 46.2* 49.0* 49.1*  MCV 87.3 84.5 86.4 86.3  PLT 169 159 174 179   Cardiac Enzymes: No results found for this basename: CKTOTAL:5,CKMB:5,CKMBINDEX:5,TROPONINI:5 in the last 168 hours BNP (last 3 results) No results found for this basename: PROBNP:3 in the last 8760 hours CBG:  Lab 05/06/12 1011  GLUCAP 93    No results found for this or any previous visit (from the past 240 hour(s)).   Studies: No results found.  Scheduled Meds:    . atorvastatin  80 mg Oral q1800  . docusate sodium  100 mg Oral Daily  . sodium chloride  3 mL Intravenous Q12H  . tiotropium  18 mcg Inhalation Daily  . verapamil  240 mg Oral  Daily   Continuous Infusions:    . sodium chloride 999 mL (05/06/12 1115)  . heparin 850 Units/hr (05/07/12 1100)  . DISCONTD: heparin 550 Units/hr (05/06/12 0000)  . DISCONTD: heparin 700 Units/hr (05/07/12 0151)    Principal Problem:  *Stroke Active Problems:  TOBACCO ABUSE  FIBRILLATION, ATRIAL  CAROTID ARTERY DISEASE  PVD  Hypokalemia  COPD (chronic obstructive pulmonary disease)  Hemiballismus  TIA (transient ischemic attack)    Time spent: > 35 minutes    Penny Pia  [facility] Pager [phone]. If 8PM-8AM, please contact night-coverage at [email or web address], password [facility] 05/07/2012, 2:52 PM  LOS: 5 days

## 2012-05-07 NOTE — Progress Notes (Signed)
Occupational Therapy Treatment and Discharge Patient Details Name: Caroline [name] MRN: [id number] DOB: 02/19/1935 Today's Date: 05/07/2012 Time: 4098-1191 OT Time Calculation (min): 35 min  OT Assessment / Plan / Recommendation Comments on Treatment Session Pt is knowledgeable in use and availability of tub transfer bench for safety at home.  No further OT needs.    Follow Up Recommendations  No OT follow up    Barriers to Discharge       Equipment Recommendations  None recommended by OT    Recommendations for Other Services    Frequency     Plan Discharge plan remains appropriate    Precautions / Restrictions Precautions Precautions: Fall   Pertinent Vitals/Pain No pain    ADL  Tub/Shower Transfer: Performed;Supervision/safety Tub/Shower Transfer Method: Science writer: Transfer tub bench ADL Comments: Pt is knowledgeable in use and where a tub bench may be obtained.  Left handout with pictures for family.  Recommendend hand held shower head with shut off at the handle to decrease build up of steam when showering.    OT Diagnosis:    OT Problem List:   OT Treatment Interventions:     OT Goals Acute Rehab OT Goals OT Goal Formulation: With patient ADL Goals Pt Will Perform Tub/Shower Transfer: Tub transfer;Transfer tub bench ADL Goal: Tub/Shower Transfer - Progress: Met  Visit Information  Last OT Received On: 05/07/12 Assistance Needed: +1    Subjective Data      Prior Functioning       Cognition  Overall Cognitive Status: Appears within functional limits for tasks assessed/performed Arousal/Alertness: Awake/alert Orientation Level: Appears intact for tasks assessed Behavior During Session: [facility] for tasks performed    Mobility  Shoulder Instructions Transfers Sit to Stand: 5: Supervision;From bed Stand to Sit: To bed;5: Supervision       Exercises      Balance     End of Session OT - End of Session Equipment Utilized  During Treatment: Gait belt Activity Tolerance: Patient tolerated treatment well Patient left: in bed;Other (comment) (EOB eating lunch)  GO     Evern Bio 05/07/2012, 12:49 PM [phone]

## 2012-05-07 NOTE — Progress Notes (Signed)
No new neuro events No hematoma right groin Plan for left CEA on Monday Continue heparin  Fabienne Bruns, MD [facility] [place] Office: [phone] Pager: [phone]

## 2012-05-07 NOTE — Progress Notes (Signed)
Pharmacy Consult-Anticoagulation  Pharmacy Consult:   77 y/o female is currently on heparin protocol for CVA with hx of afib   Patient Measurements:  Height: 5\' 4"  (162.6 cm)  Weight: 114 lb (51.71 kg)  IBW/kg (Calculated) : 54.7  Heparin Dosing Weight: 51.7 kg  Current Labs: Hematology  Basename 05/07/12 1911 05/07/12 0935 05/07/12 0041  HEPARINUNFRC 0.53 0.14* 0.18*   Lab Results  Component Value Date   INR 0.97 05/06/2012   INR 1.30 02/23/2010   INR 2.4* 05/29/2008    Estimated Creatinine Clearance: 49.6 ml/min (by C-G formula based on Cr of 0.68).  Current Meds:   Heparin Last Rate: 850 Units/hr (05/07/12 1100)   Assessment:  Heparin level is within the therapeutic target goal..  No bleeding complications observed.  Goal/Plan:  Heparin goal is 0.3 - 0.7 units/ml.  Heparin will be continued at the same rate.  Next Heparin level due with daily labs.  Daily Heparin level and CBC while on Heparin.  Magon Croson, Elisha Headland, Pharm.D. 05/07/2012  8:51 PM

## 2012-05-08 LAB — CBC
HCT: 45.3 % (ref 36.0–46.0)
MCHC: 34 g/dL (ref 30.0–36.0)
MCV: 86.5 fL (ref 78.0–100.0)
RDW: 14.9 % (ref 11.5–15.5)

## 2012-05-08 MED ORDER — VANCOMYCIN HCL IN DEXTROSE 1-5 GM/200ML-% IV SOLN
1000.0000 mg | Freq: Once | INTRAVENOUS | Status: AC
  Administered 2012-05-11: 1000 mg via INTRAVENOUS
  Filled 2012-05-08: qty 200

## 2012-05-08 NOTE — Progress Notes (Signed)
PT Cancellation Note  Patient Details Name: Caroline [name] MRN: [id number] DOB: 04/17/1935   Cancelled Treatment:    Reason Eval/Treat Not Completed: Other (comment) (IV battery dead and pt on heparin.  Unable to disconnect IV.)  RN notes she will walk pt later after battery charges.     Iyan Flett, Alison Murray 05/08/2012, 2:48 PM

## 2012-05-08 NOTE — Progress Notes (Signed)
ANTICOAGULATION CONSULT NOTE - Follow Up Consult  Pharmacy Consult for heparin Indication: CVA with hx of afib  Allergies  Allergen Reactions  . Motrin (Ibuprofen)     Gi upset  . Albuterol     Confusion and shortness of breath  . Amoxicillin Hives  . Cephalexin Hives  . Codeine     confusion  . Entex Pac (Pseudoephedrine-Gg & Dm)   . Etanercept     vertigo  . Fluticasone-Salmeterol     Confusion and shortness of breath  . Gualenic Acid   . Lorazepam     hallucinations  . Nubain (Nalbuphine Hcl)   . Oxycodone Hcl Nausea And Vomiting  . Oxycodone-Acetaminophen Nausea And Vomiting    Patient Measurements: Height: 5\' 4"  (162.6 cm) Weight: 121 lb (54.885 kg) IBW/kg (Calculated) : 54.7  Heparin Dosing Weight: 51.7 kg  Vital Signs: Temp: 98 F (36.7 C) (11/01 0606) Temp src: Oral (11/01 0606) BP: 124/83 mmHg (11/01 0606) Pulse Rate: 86  (11/01 0606)  Labs:  Basename 05/08/12 4098 05/07/12 1911 05/07/12 0935 05/06/12 0640  HGB 15.4* -- -- 16.9*  HCT 45.3 -- -- 49.5*  PLT 136* -- -- 169  APTT -- -- -- 32  LABPROT -- -- -- 12.8  INR -- -- -- 0.97  HEPARINUNFRC 0.51 0.53 0.14* --  CREATININE -- -- -- 0.68  CKTOTAL -- -- -- --  CKMB -- -- -- --  TROPONINI -- -- -- --    Estimated Creatinine Clearance: 52.5 ml/min (by C-G formula based on Cr of 0.68).   Medications:  Scheduled:     . atorvastatin  80 mg Oral q1800  . docusate sodium  100 mg Oral Daily  . sodium chloride  3 mL Intravenous Q12H  . tiotropium  18 mcg Inhalation Daily  . verapamil  240 mg Oral Daily   Infusions:     . sodium chloride 999 mL (05/06/12 1115)  . heparin 850 Units/hr (05/08/12 0534)    Assessment: 77 yo female with CVA and hx of afib. Patient noted with significant L ICA stenosis and noted for CEA on 05/11/12. Plan for heparin until CEA. Heparin level (0.51) is very slightly above goal of 0.3-0.5. No bleeding noted. Last nights heparin level was 0.53 but dose was not adjusted.    Goal of Therapy:  Heparin level 0.3-0.5 units/ml Monitor platelets by anticoagulation protocol: Yes   Plan:  1. Continue IV heparin at 850 units/hr (if heparin level is higher in the morning, would cut back dose) 2. Continue daily heparin level and CBC  Lysle Pearl, PharmD, BCPS Pager # [phone] 05/08/2012 1:14 PM

## 2012-05-08 NOTE — Progress Notes (Signed)
Patient ambulated in hallway as ordered tolerated well

## 2012-05-08 NOTE — Progress Notes (Signed)
[facility] PROGRESS NOTE  AJWA KIMBERLEY ZOX:[id number] DOB: [birth year] DOA: 05/02/2012 PCP: Cassell Smiles., MD  Assessment/Plan: 1. LICA stenosis - VVS on board and managing - continue heparin IV, given stricture and recent history of tia on pradaxa at this point admission warranted for close monitoring and IV anticoagulant administration while awaiting LCEA - avoid pradaxa - Continue statin  2. Hypokalemia -resolved. Last potassium 4.7  3. COPD - Stable at this juncture  4. Atrial fibrillation - Stable on Verapamil - Continue to monitor heart rate and adjust medication pending heart rate while in house.  - on heparin at this juncture.  Code Status: DNR Family Communication: Spoke with patient and daughter in the room Disposition Plan: Pt has L CEA on Nov 4.   Consultants:  VVS: Dr. Darrick Penna  Procedures: Arch aortogram with bilateral carotid angiogram, right iliac angiogram, selective left subclavian angiogram 05/06/12  Antibiotics:  None  HPI/Subjective: Pt has no new complaints at this point. Feeling better.  Would like to ambulate more.  Objective: Filed Vitals:   05/08/12 0148 05/08/12 0500 05/08/12 0606 05/08/12 0901  BP: 130/75  124/83   Pulse: 71  86   Temp: 97.8 F (36.6 C)  98 F (36.7 C)   TempSrc: Oral  Oral   Resp: 20  20   Height:      Weight:  54.885 kg (121 lb)    SpO2: 95%  97% 96%    Intake/Output Summary (Last 24 hours) at 05/08/12 1225 Last data filed at 05/07/12 1300  Gross per 24 hour  Intake      0 ml  Output    300 ml  Net   -300 ml   Filed Weights   05/04/12 0700 05/04/12 2000 05/08/12 0500  Weight: 51.619 kg (113 lb 12.8 oz) 51.71 kg (114 lb) 54.885 kg (121 lb)    Exam:   General:  Pt in NAD, Alert and Awake  Cardiovascular: S1 and S2, no murmurs, Left carotid bruit  Respiratory: CTA BL, no wheezes  Abdomen: soft, NT, ND  Data Reviewed: Basic Metabolic Panel:  Lab 05/06/12 8119 05/03/12 0645 05/02/12  1940  NA 138 139 135  K 4.7 3.5 3.4*  CL 100 97 94*  CO2 33* 35* 35*  GLUCOSE 84 92 83  BUN 10 11 16   CREATININE 0.68 0.63 0.82  CALCIUM 9.2 9.2 8.9  MG -- -- --  PHOS -- -- --   Liver Function Tests:  Lab 05/02/12 1940  AST 17  ALT 17  ALKPHOS 85  BILITOT 0.3  PROT 5.9*  ALBUMIN 3.4*   No results found for this basename: LIPASE:5,AMYLASE:5 in the last 168 hours No results found for this basename: AMMONIA:5 in the last 168 hours CBC:  Lab 05/08/12 0638 05/06/12 0640 05/05/12 0200 05/03/12 0645 05/02/12 1940  WBC 7.6 6.2 7.0 6.2 8.0  NEUTROABS -- -- -- -- 4.9  HGB 15.4* 16.9* 16.3* 16.7* 17.1*  HCT 45.3 49.5* 46.2* 49.0* 49.1*  MCV 86.5 87.3 84.5 86.4 86.3  PLT 136* 169 159 174 179   Cardiac Enzymes: No results found for this basename: CKTOTAL:5,CKMB:5,CKMBINDEX:5,TROPONINI:5 in the last 168 hours BNP (last 3 results) No results found for this basename: PROBNP:3 in the last 8760 hours CBG:  Lab 05/06/12 1011  GLUCAP 93    No results found for this or any previous visit (from the past 240 hour(s)).   Studies: No results found.  Scheduled Meds:    . atorvastatin  80 mg  Oral q1800  . docusate sodium  100 mg Oral Daily  . sodium chloride  3 mL Intravenous Q12H  . tiotropium  18 mcg Inhalation Daily  . verapamil  240 mg Oral Daily   Continuous Infusions:    . sodium chloride 999 mL (05/06/12 1115)  . heparin 850 Units/hr (05/08/12 0534)    Principal Problem:  *Stroke Active Problems:  TOBACCO ABUSE  FIBRILLATION, ATRIAL  CAROTID ARTERY DISEASE  PVD  Hypokalemia  COPD (chronic obstructive pulmonary disease)  Hemiballismus  TIA (transient ischemic attack)    Time spent: > 35 minutes    Penny Pia  [facility] Pager [phone]. If 8PM-8AM, please contact night-coverage at [email or web address], password [facility] 05/08/2012, 12:25 PM  LOS: 6 days

## 2012-05-09 NOTE — Progress Notes (Signed)
[facility] PROGRESS NOTE  CLOIE WOODEN [name]:[id number] DOB: 30-Jun-1935 DOA: 05/02/2012 PCP: Cassell Smiles., MD  Assessment/Plan: 1. LICA stenosis - VVS on board and managing - continue heparin IV, given stricture and recent history of tia on pradaxa at this point admission warranted for close monitoring and IV anticoagulant administration while awaiting LCEA - avoid pradaxa - Continue statin  2. Hypokalemia -resolved. Last potassium 4.7  3. COPD - Stable at this juncture  4. Atrial fibrillation - Stable on Verapamil - Continue to monitor heart rate and adjust medication pending heart rate while in house.  - on heparin at this juncture.  Code Status: DNR Family Communication: Spoke with patient and daughter in the room Disposition Plan: Pt has L CEA on Nov 4.   Consultants:  VVS: Dr. Darrick Penna  Procedures: Arch aortogram with bilateral carotid angiogram, right iliac angiogram, selective left subclavian angiogram 05/06/12  Antibiotics:  None  HPI/Subjective: Pt has no new complaints at this point. No acute issues reported overnight.  Objective: Filed Vitals:   05/09/12 0200 05/09/12 0600 05/09/12 0746 05/09/12 1058  BP: 108/66 129/70  126/69  Pulse: 65 71  88  Temp: 97.5 F (36.4 C) 97.7 F (36.5 C)  97.7 F (36.5 C)  TempSrc:      Resp: 16 16  20   Height:      Weight:  57.2 kg (126 lb 1.7 oz)    SpO2: 97% 95% 100% 100%    Intake/Output Summary (Last 24 hours) at 05/09/12 1132 Last data filed at 05/09/12 1104  Gross per 24 hour  Intake    240 ml  Output      0 ml  Net    240 ml   Filed Weights   05/04/12 2000 05/08/12 0500 05/09/12 0600  Weight: 51.71 kg (114 lb) 54.885 kg (121 lb) 57.2 kg (126 lb 1.7 oz)    Exam:   General:  Pt in NAD, Alert and Awake  Cardiovascular: S1 and S2, no murmurs, Left carotid bruit  Respiratory: CTA BL, no wheezes  Abdomen: soft, NT, ND  Data Reviewed: Basic Metabolic Panel:  Lab 05/06/12 8119  05/03/12 0645 05/02/12 1940  NA 138 139 135  K 4.7 3.5 3.4*  CL 100 97 94*  CO2 33* 35* 35*  GLUCOSE 84 92 83  BUN 10 11 16   CREATININE 0.68 0.63 0.82  CALCIUM 9.2 9.2 8.9  MG -- -- --  PHOS -- -- --   Liver Function Tests:  Lab 05/02/12 1940  AST 17  ALT 17  ALKPHOS 85  BILITOT 0.3  PROT 5.9*  ALBUMIN 3.4*   No results found for this basename: LIPASE:5,AMYLASE:5 in the last 168 hours No results found for this basename: AMMONIA:5 in the last 168 hours CBC:  Lab 05/08/12 0638 05/06/12 0640 05/05/12 0200 05/03/12 0645 05/02/12 1940  WBC 7.6 6.2 7.0 6.2 8.0  NEUTROABS -- -- -- -- 4.9  HGB 15.4* 16.9* 16.3* 16.7* 17.1*  HCT 45.3 49.5* 46.2* 49.0* 49.1*  MCV 86.5 87.3 84.5 86.4 86.3  PLT 136* 169 159 174 179   Cardiac Enzymes: No results found for this basename: CKTOTAL:5,CKMB:5,CKMBINDEX:5,TROPONINI:5 in the last 168 hours BNP (last 3 results) No results found for this basename: PROBNP:3 in the last 8760 hours CBG:  Lab 05/06/12 1011  GLUCAP 93    No results found for this or any previous visit (from the past 240 hour(s)).   Studies: No results found.  Scheduled Meds:    . atorvastatin  80 mg Oral q1800  . docusate sodium  100 mg Oral Daily  . sodium chloride  3 mL Intravenous Q12H  . tiotropium  18 mcg Inhalation Daily  . vancomycin  1,000 mg Intravenous Once  . verapamil  240 mg Oral Daily   Continuous Infusions:    . sodium chloride 100 mL/hr at 05/09/12 0936  . heparin 850 Units/hr (05/09/12 1103)    Principal Problem:  *Stroke Active Problems:  TOBACCO ABUSE  FIBRILLATION, ATRIAL  CAROTID ARTERY DISEASE  PVD  Hypokalemia  COPD (chronic obstructive pulmonary disease)  Hemiballismus  TIA (transient ischemic attack)    Time spent:    Caroline [name]  [facility] Pager [phone]. If 8PM-8AM, please contact night-coverage at [email or web address], password [facility] 05/09/2012, 11:32 AM  LOS: 7 days

## 2012-05-09 NOTE — Progress Notes (Signed)
ANTICOAGULATION CONSULT NOTE - Follow Up Consult  Pharmacy Consult for heparin Indication: CVA with hx of afib  Allergies  Allergen Reactions  . Motrin (Ibuprofen)     Gi upset  . Albuterol     Confusion and shortness of breath  . Amoxicillin Hives  . Cephalexin Hives  . Codeine     confusion  . Entex Pac (Pseudoephedrine-Gg & Dm)   . Etanercept     vertigo  . Fluticasone-Salmeterol     Confusion and shortness of breath  . Gualenic Acid   . Lorazepam     hallucinations  . Nubain (Nalbuphine Hcl)   . Oxycodone Hcl Nausea And Vomiting  . Oxycodone-Acetaminophen Nausea And Vomiting    Patient Measurements: Height: 5\' 4"  (162.6 cm) Weight: 126 lb 1.7 oz (57.2 kg) IBW/kg (Calculated) : 54.7  Heparin Dosing Weight: 51.7 kg  Vital Signs: Temp: 97.7 F (36.5 C) (11/02 1058) BP: 126/69 mmHg (11/02 1058) Pulse Rate: 88  (11/02 1058)  Labs:  Basename 05/09/12 0625 05/08/12 0638 05/07/12 1911  HGB -- 15.4* --  HCT -- 45.3 --  PLT -- 136* --  APTT -- -- --  LABPROT -- -- --  INR -- -- --  HEPARINUNFRC 0.54 0.51 0.53  CREATININE -- -- --  CKTOTAL -- -- --  CKMB -- -- --  TROPONINI -- -- --    Estimated Creatinine Clearance: 52.5 ml/min (by C-G formula based on Cr of 0.68).   Medications:  Scheduled:     . atorvastatin  80 mg Oral q1800  . docusate sodium  100 mg Oral Daily  . sodium chloride  3 mL Intravenous Q12H  . tiotropium  18 mcg Inhalation Daily  . vancomycin  1,000 mg Intravenous Once  . verapamil  240 mg Oral Daily   Infusions:     . sodium chloride 100 mL/hr at 05/09/12 0936  . heparin 850 Units/hr (05/09/12 1103)    Assessment: 77 yo female with CVA and hx of afib. Patient noted with significant L ICA stenosis and noted for CEA on 05/11/12. Plan for heparin until CEA. Heparin level (0.54) is very slightly above goal of 0.3-0.5. No bleeding noted. It is stable from yesterday, and given that it was challenging to reach a therapeutic heparin  level for her I am going to maintain her on the same infusion rate.  Goal of Therapy:  Heparin level 0.3-0.5 units/ml Monitor platelets by anticoagulation protocol: Yes   Plan:  1. Continue IV heparin at 850 units/hr 2. Continue daily heparin level and CBC  Estella Husk, Pharm.D., BCPS Clinical Pharmacist  Phone [phone] Pager [phone] 05/09/2012, 1:13 PM

## 2012-05-10 LAB — CBC
HCT: 42.7 % (ref 36.0–46.0)
MCV: 86.6 fL (ref 78.0–100.0)
RDW: 14.9 % (ref 11.5–15.5)
WBC: 7.1 10*3/uL (ref 4.0–10.5)

## 2012-05-10 NOTE — Progress Notes (Signed)
Patient ID: Caroline [name], female   DOB: 07/22/1934, 77 y.o.   MRN: [id number] No new neurologic changes. Discussed time for surgery in the morning with the patient who agrees. She will have her heparin held on call to the OR. 

## 2012-05-10 NOTE — Progress Notes (Addendum)
[facility] PROGRESS NOTE  LISA MILIAN AVW:[id number] DOB: 1935-06-27 DOA: 05/02/2012 PCP: Cassell Smiles., MD  Addendum: Brief HPI 77 y/o with h/o severe PVD that presented with acute onset of left facial weakness involving both upper and lower facial muscles lasting for 10-15 minutes.  After evaluation by neurologist patient was known to have L ICA stenosis but has got worse and Neurology recommended adjustment of symptomatic L ICA stenosis.  Subsequently Vascular surgery was consulted. Patient was taken off pradaxa and placed on heparin IV.  Scheduled for LCEA 05/11/12.  Assessment/Plan: 1. LICA stenosis - VVS on board and managing scheduled for LCEA 05/11/12 - continue heparin IV, given stricture and recent history of tia on pradaxa at this point admission warranted for close monitoring and IV anticoagulant administration while awaiting LCEA - avoid pradaxa, IV heparin to be held while patient on call to OR. - Continue statin  2. Hypokalemia -resolved. Last potassium 4.7  3. COPD - Stable at this juncture  4. Atrial fibrillation - Stable on Verapamil - Continue to monitor heart rate and adjust medication pending heart rate while in house.  - on heparin at this juncture.  Code Status: DNR Family Communication: Spoke with patient and daughter in the room Disposition Plan: Pt has L CEA on Nov 4.   Consultants:  VVS: Dr. Darrick Penna  Procedures: Arch aortogram with bilateral carotid angiogram, right iliac angiogram, selective left subclavian angiogram 05/06/12  Antibiotics:  None  HPI/Subjective: Pt has no new complaints at this point. No acute issues reported overnight.  Is aware of plan and agreement for surgery tomorrow.  Objective: Filed Vitals:   05/10/12 0152 05/10/12 0655 05/10/12 0840 05/10/12 1020  BP: 97/57 113/75  148/80  Pulse: 67 80  77  Temp: 97.4 F (36.3 C) 97.4 F (36.3 C)  97.8 F (36.6 C)  TempSrc: Oral Oral    Resp: 20 18  20   Height:       Weight:      SpO2: 96% 94% 96% 100%    Intake/Output Summary (Last 24 hours) at 05/10/12 1403 Last data filed at 05/10/12 1300  Gross per 24 hour  Intake   1820 ml  Output      0 ml  Net   1820 ml   Filed Weights   05/04/12 2000 05/08/12 0500 05/09/12 0600  Weight: 51.71 kg (114 lb) 54.885 kg (121 lb) 57.2 kg (126 lb 1.7 oz)    Exam:   General:  Pt in NAD, Alert and Awake  Cardiovascular: S1 and S2, no murmurs, Left carotid bruit  Respiratory: CTA BL, no wheezes  Abdomen: soft, NT, ND  Data Reviewed: Basic Metabolic Panel:  Lab 05/06/12 8295  NA 138  K 4.7  CL 100  CO2 33*  GLUCOSE 84  BUN 10  CREATININE 0.68  CALCIUM 9.2  MG --  PHOS --   Liver Function Tests: No results found for this basename: AST:5,ALT:5,ALKPHOS:5,BILITOT:5,PROT:5,ALBUMIN:5 in the last 168 hours No results found for this basename: LIPASE:5,AMYLASE:5 in the last 168 hours No results found for this basename: AMMONIA:5 in the last 168 hours CBC:  Lab 05/10/12 1305 05/08/12 0638 05/06/12 0640 05/05/12 0200  WBC 7.1 7.6 6.2 7.0  NEUTROABS -- -- -- --  HGB 14.5 15.4* 16.9* 16.3*  HCT 42.7 45.3 49.5* 46.2*  MCV 86.6 86.5 87.3 84.5  PLT 153 136* 169 159   Cardiac Enzymes: No results found for this basename: CKTOTAL:5,CKMB:5,CKMBINDEX:5,TROPONINI:5 in the last 168 hours BNP (last 3 results)  No results found for this basename: PROBNP:3 in the last 8760 hours CBG:  Lab 05/06/12 1011  GLUCAP 93    No results found for this or any previous visit (from the past 240 hour(s)).   Studies: No results found.  Scheduled Meds:    . atorvastatin  80 mg Oral q1800  . docusate sodium  100 mg Oral Daily  . sodium chloride  3 mL Intravenous Q12H  . tiotropium  18 mcg Inhalation Daily  . vancomycin  1,000 mg Intravenous Once  . verapamil  240 mg Oral Daily   Continuous Infusions:    . sodium chloride 100 mL/hr at 05/10/12 0424  . heparin 850 Units/hr (05/09/12 1103)    Principal  Problem:  *Stroke Active Problems:  TOBACCO ABUSE  FIBRILLATION, ATRIAL  CAROTID ARTERY DISEASE  PVD  Hypokalemia  COPD (chronic obstructive pulmonary disease)  Hemiballismus  TIA (transient ischemic attack)    Time spent:    Penny Pia  [facility] Pager [phone]. If 8PM-8AM, please contact night-coverage at [email or web address], password [facility] 05/10/2012, 2:03 PM  LOS: 8 days

## 2012-05-10 NOTE — Progress Notes (Signed)
ANTICOAGULATION CONSULT NOTE - Follow Up Consult  Pharmacy Consult for heparin Indication: CVA with hx of afib  Allergies  Allergen Reactions  . Motrin (Ibuprofen)     Gi upset  . Albuterol     Confusion and shortness of breath  . Amoxicillin Hives  . Cephalexin Hives  . Codeine     confusion  . Entex Pac (Pseudoephedrine-Gg & Dm)   . Etanercept     vertigo  . Fluticasone-Salmeterol     Confusion and shortness of breath  . Gualenic Acid   . Lorazepam     hallucinations  . Nubain (Nalbuphine Hcl)   . Oxycodone Hcl Nausea And Vomiting  . Oxycodone-Acetaminophen Nausea And Vomiting    Patient Measurements: Height: 5\' 4"  (162.6 cm) Weight: 126 lb 1.7 oz (57.2 kg) IBW/kg (Calculated) : 54.7  Heparin Dosing Weight: 51.7 kg  Vital Signs: Temp: 97.8 F (36.6 C) (11/03 1020) Temp src: Oral (11/03 0655) BP: 148/80 mmHg (11/03 1020) Pulse Rate: 77  (11/03 1020)  Labs:  Basename 05/10/12 1305 05/10/12 0640 05/09/12 0625 05/08/12 0638  HGB 14.5 -- -- 15.4*  HCT 42.7 -- -- 45.3  PLT 153 -- -- 136*  APTT -- -- -- --  LABPROT -- -- -- --  INR -- -- -- --  HEPARINUNFRC -- 0.50 0.54 0.51  CREATININE -- -- -- --  CKTOTAL -- -- -- --  CKMB -- -- -- --  TROPONINI -- -- -- --    Estimated Creatinine Clearance: 52.5 ml/min (by C-G formula based on Cr of 0.68).   Medications:  Scheduled:     . atorvastatin  80 mg Oral q1800  . docusate sodium  100 mg Oral Daily  . sodium chloride  3 mL Intravenous Q12H  . tiotropium  18 mcg Inhalation Daily  . vancomycin  1,000 mg Intravenous Once  . verapamil  240 mg Oral Daily   Infusions:     . sodium chloride 100 mL/hr at 05/10/12 0424  . heparin 850 Units/hr (05/09/12 1103)    Assessment: 77 yo female with CVA and hx of afib. Patient noted with significant L ICA stenosis and noted for CEA on 05/11/12. Plan for heparin until CEA. Heparin level is within the therapeutic range.  No bleeding noted.  CBC is stable.  Goal of  Therapy:  Heparin level 0.3-0.5 units/ml Monitor platelets by anticoagulation protocol: Yes   Plan:  Continue Heparin at 850 units/hr Follow-up after CEA on 11/4 for further anticoagulation plans  Estella Husk, Pharm.D., BCPS Clinical Pharmacist  Phone [phone] Pager [phone] 05/10/2012, 2:16 PM

## 2012-05-11 ENCOUNTER — Encounter (HOSPITAL_COMMUNITY): Payer: Self-pay | Admitting: Anesthesiology

## 2012-05-11 ENCOUNTER — Encounter (HOSPITAL_COMMUNITY)
Admission: AD | Disposition: A | Discharge status: Home Health | Payer: Self-pay | Source: Home / Self Care | Attending: Family Medicine

## 2012-05-11 ENCOUNTER — Inpatient Hospital Stay (HOSPITAL_COMMUNITY): Payer: Medicare Other | Admitting: Anesthesiology

## 2012-05-11 DIAGNOSIS — I6529 Occlusion and stenosis of unspecified carotid artery: Secondary | ICD-10-CM

## 2012-05-11 HISTORY — PX: PATCH ANGIOPLASTY: SHX6230

## 2012-05-11 HISTORY — PX: CAROTID ENDARTERECTOMY: SUR193

## 2012-05-11 HISTORY — PX: ENDARTERECTOMY: SHX5162

## 2012-05-11 LAB — CBC
HCT: 43.7 % (ref 36.0–46.0)
Hemoglobin: 15.1 g/dL — ABNORMAL HIGH (ref 12.0–15.0)
MCH: 30 pg (ref 26.0–34.0)
MCHC: 34.6 g/dL (ref 30.0–36.0)

## 2012-05-11 LAB — BASIC METABOLIC PANEL
BUN: 10 mg/dL (ref 6–23)
Chloride: 104 mEq/L (ref 96–112)
GFR calc non Af Amer: 88 mL/min — ABNORMAL LOW (ref >90)
Glucose, Bld: 85 mg/dL (ref 70–99)
Potassium: 4.4 mEq/L (ref 3.5–5.1)

## 2012-05-11 LAB — MRSA PCR SCREENING: MRSA by PCR: NEGATIVE

## 2012-05-11 SURGERY — ENDARTERECTOMY, CAROTID
Anesthesia: General | Site: Neck | Laterality: Left | Wound class: Clean

## 2012-05-11 MED ORDER — ARTIFICIAL TEARS OP OINT
TOPICAL_OINTMENT | OPHTHALMIC | Status: DC | PRN
  Administered 2012-05-11: 1 via OPHTHALMIC

## 2012-05-11 MED ORDER — SODIUM CHLORIDE 0.9 % IR SOLN
Status: DC | PRN
  Administered 2012-05-11: 11:00:00

## 2012-05-11 MED ORDER — ALUM & MAG HYDROXIDE-SIMETH 200-200-20 MG/5ML PO SUSP: 15.0000 mL | ORAL | Status: DC | PRN

## 2012-05-11 MED ORDER — VANCOMYCIN HCL IN DEXTROSE 1-5 GM/200ML-% IV SOLN
1000.0000 mg | Freq: Two times a day (BID) | INTRAVENOUS | Status: AC
  Administered 2012-05-11 – 2012-05-12 (×2): 1000 mg via INTRAVENOUS
  Filled 2012-05-11 (×2): qty 200

## 2012-05-11 MED ORDER — FENTANYL CITRATE 0.05 MG/ML IJ SOLN
INTRAMUSCULAR | Status: DC | PRN
  Administered 2012-05-11: 50 ug via INTRAVENOUS
  Administered 2012-05-11: 25 ug via INTRAVENOUS
  Administered 2012-05-11: 100 ug via INTRAVENOUS
  Administered 2012-05-11 (×2): 50 ug via INTRAVENOUS
  Administered 2012-05-11: 75 ug via INTRAVENOUS

## 2012-05-11 MED ORDER — POTASSIUM CHLORIDE CRYS ER 20 MEQ PO TBCR: 20.0000 meq | EXTENDED_RELEASE_TABLET | Freq: Once | ORAL | Status: AC | PRN

## 2012-05-11 MED ORDER — ACETAMINOPHEN 650 MG RE SUPP: 325.0000 mg | RECTAL | Status: DC | PRN

## 2012-05-11 MED ORDER — MORPHINE SULFATE 2 MG/ML IJ SOLN: 2.0000 mg | INTRAMUSCULAR | Status: DC | PRN

## 2012-05-11 MED ORDER — ROCURONIUM BROMIDE 100 MG/10ML IV SOLN
INTRAVENOUS | Status: DC | PRN
  Administered 2012-05-11: 50 mg via INTRAVENOUS

## 2012-05-11 MED ORDER — HEPARIN (PORCINE) IN NACL 100-0.45 UNIT/ML-% IJ SOLN
850.0000 [IU]/h | INTRAMUSCULAR | Status: DC
  Administered 2012-05-11: 850 [IU]/h via INTRAVENOUS
  Filled 2012-05-11: qty 250

## 2012-05-11 MED ORDER — HYDROCODONE-ACETAMINOPHEN 5-325 MG PO TABS: 1.0000 | ORAL_TABLET | Freq: Four times a day (QID) | ORAL | Status: DC | PRN

## 2012-05-11 MED ORDER — SODIUM CHLORIDE 0.9 % IV SOLN
10.0000 mg | INTRAVENOUS | Status: DC | PRN
  Administered 2012-05-11: 20 ug/min via INTRAVENOUS

## 2012-05-11 MED ORDER — PROPOFOL 10 MG/ML IV BOLUS
INTRAVENOUS | Status: DC | PRN
  Administered 2012-05-11: 20 mg via INTRAVENOUS
  Administered 2012-05-11: 130 mg via INTRAVENOUS
  Administered 2012-05-11: 30 mg via INTRAVENOUS

## 2012-05-11 MED ORDER — HYDROMORPHONE HCL PF 1 MG/ML IJ SOLN: 0.2500 mg | INTRAMUSCULAR | Status: DC | PRN

## 2012-05-11 MED ORDER — LIDOCAINE HCL (CARDIAC) 20 MG/ML IV SOLN
INTRAVENOUS | Status: DC | PRN
  Administered 2012-05-11: 80 mg via INTRAVENOUS

## 2012-05-11 MED ORDER — 0.9 % SODIUM CHLORIDE (POUR BTL) OPTIME
TOPICAL | Status: DC | PRN
  Administered 2012-05-11: 1000 mL

## 2012-05-11 MED ORDER — LACTATED RINGERS IV SOLN
INTRAVENOUS | Status: DC | PRN
  Administered 2012-05-11 (×2): via INTRAVENOUS

## 2012-05-11 MED ORDER — PROTAMINE SULFATE 10 MG/ML IV SOLN
INTRAVENOUS | Status: DC | PRN
  Administered 2012-05-11: 20 mg via INTRAVENOUS
  Administered 2012-05-11 (×3): 10 mg via INTRAVENOUS

## 2012-05-11 MED ORDER — METOPROLOL TARTRATE 1 MG/ML IV SOLN: 2.0000 mg | INTRAVENOUS | Status: DC | PRN

## 2012-05-11 MED ORDER — GLYCOPYRROLATE 0.2 MG/ML IJ SOLN
INTRAMUSCULAR | Status: DC | PRN
  Administered 2012-05-11: 0.4 mg via INTRAVENOUS

## 2012-05-11 MED ORDER — LABETALOL HCL 5 MG/ML IV SOLN
INTRAVENOUS | Status: DC | PRN
  Administered 2012-05-11: 5 mg via INTRAVENOUS

## 2012-05-11 MED ORDER — HEPARIN SODIUM (PORCINE) 1000 UNIT/ML IJ SOLN
INTRAMUSCULAR | Status: DC | PRN
  Administered 2012-05-11: 7000 [IU] via INTRAVENOUS

## 2012-05-11 MED ORDER — NEOSTIGMINE METHYLSULFATE 1 MG/ML IJ SOLN
INTRAMUSCULAR | Status: DC | PRN
  Administered 2012-05-11: 2.5 mg via INTRAVENOUS

## 2012-05-11 MED ORDER — ONDANSETRON HCL 4 MG/2ML IJ SOLN: 4.0000 mg | Freq: Once | INTRAMUSCULAR | Status: DC | PRN

## 2012-05-11 MED ORDER — LABETALOL HCL 5 MG/ML IV SOLN: 10.0000 mg | INTRAVENOUS | Status: DC | PRN

## 2012-05-11 MED ORDER — PANTOPRAZOLE SODIUM 40 MG PO TBEC
40.0000 mg | DELAYED_RELEASE_TABLET | Freq: Every day | ORAL | Status: DC
  Administered 2012-05-12 – 2012-05-13 (×2): 40 mg via ORAL
  Filled 2012-05-11 (×2): qty 1

## 2012-05-11 MED ORDER — SODIUM CHLORIDE 0.9 % IV SOLN: 500.0000 mL | Freq: Once | INTRAVENOUS | Status: AC | PRN

## 2012-05-11 MED ORDER — ASPIRIN EC 325 MG PO TBEC: 325.0000 mg | DELAYED_RELEASE_TABLET | Freq: Every day | ORAL | Status: DC

## 2012-05-11 MED ORDER — TRAMADOL HCL 50 MG PO TABS: 50.0000 mg | ORAL_TABLET | Freq: Four times a day (QID) | ORAL | Status: DC | PRN

## 2012-05-11 MED ORDER — DOPAMINE-DEXTROSE 3.2-5 MG/ML-% IV SOLN: 3.0000 ug/kg/min | INTRAVENOUS | Status: DC

## 2012-05-11 MED ORDER — HYDRALAZINE HCL 20 MG/ML IJ SOLN: 10.0000 mg | INTRAMUSCULAR | Status: DC | PRN

## 2012-05-11 SURGICAL SUPPLY — 57 items
ADH SKN CLS APL DERMABOND .7 (GAUZE/BANDAGES/DRESSINGS) ×1
CANISTER SUCTION 2500CC (MISCELLANEOUS) ×2 IMPLANT
CANNULA VESSEL W/WING WO/VALVE (CANNULA) ×2 IMPLANT
CATH ROBINSON RED A/P 18FR (CATHETERS) ×2 IMPLANT
CLIP TI MEDIUM 24 (CLIP) ×2 IMPLANT
CLIP TI WIDE RED SMALL 24 (CLIP) ×2 IMPLANT
CLOTH BEACON ORANGE TIMEOUT ST (SAFETY) ×2 IMPLANT
COVER SURGICAL LIGHT HANDLE (MISCELLANEOUS) ×2 IMPLANT
CRADLE DONUT ADULT HEAD (MISCELLANEOUS) ×2 IMPLANT
DECANTER SPIKE VIAL GLASS SM (MISCELLANEOUS) IMPLANT
DERMABOND ADVANCED (GAUZE/BANDAGES/DRESSINGS) ×1
DERMABOND ADVANCED .7 DNX12 (GAUZE/BANDAGES/DRESSINGS) ×1 IMPLANT
DRAIN HEMOVAC 1/8 X 5 (WOUND CARE) IMPLANT
DRAPE WARM FLUID 44X44 (DRAPE) ×2 IMPLANT
ELECT REM PT RETURN 9FT ADLT (ELECTROSURGICAL) ×2
ELECTRODE REM PT RTRN 9FT ADLT (ELECTROSURGICAL) ×1 IMPLANT
EVACUATOR SILICONE 100CC (DRAIN) IMPLANT
GAUZE SPONGE 2X2 8PLY STRL LF (GAUZE/BANDAGES/DRESSINGS) IMPLANT
GEL ULTRASOUND 20GR AQUASONIC (MISCELLANEOUS) IMPLANT
GLOVE BIO SURGEON STRL SZ 6.5 (GLOVE) ×2 IMPLANT
GLOVE BIO SURGEON STRL SZ7.5 (GLOVE) ×3 IMPLANT
GLOVE BIOGEL M 6.5 STRL (GLOVE) ×2 IMPLANT
GLOVE BIOGEL PI IND STRL 6.5 (GLOVE) IMPLANT
GLOVE BIOGEL PI IND STRL 7.5 (GLOVE) IMPLANT
GLOVE BIOGEL PI INDICATOR 6.5 (GLOVE) ×4
GLOVE BIOGEL PI INDICATOR 7.5 (GLOVE) ×4
GLOVE ECLIPSE 6.5 STRL STRAW (GLOVE) ×1 IMPLANT
GLOVE ORTHOPEDIC STR SZ6.5 (GLOVE) ×2 IMPLANT
GLOVE SURG SS PI 7.5 STRL IVOR (GLOVE) ×3 IMPLANT
GOWN PREVENTION PLUS XLARGE (GOWN DISPOSABLE) ×5 IMPLANT
GOWN STRL NON-REIN LRG LVL3 (GOWN DISPOSABLE) ×5 IMPLANT
KIT BASIN OR (CUSTOM PROCEDURE TRAY) ×2 IMPLANT
KIT ROOM TURNOVER OR (KITS) ×2 IMPLANT
LOOP VESSEL MINI RED (MISCELLANEOUS) IMPLANT
NDL HYPO 25GX1X1/2 BEV (NEEDLE) IMPLANT
NEEDLE HYPO 25GX1X1/2 BEV (NEEDLE) IMPLANT
NS IRRIG 1000ML POUR BTL (IV SOLUTION) ×4 IMPLANT
PACK CAROTID (CUSTOM PROCEDURE TRAY) ×2 IMPLANT
PAD ARMBOARD 7.5X6 YLW CONV (MISCELLANEOUS) ×4 IMPLANT
PATCH HEMASHIELD 8X75 (Vascular Products) ×1 IMPLANT
SHUNT CAROTID BYPASS 10 (VASCULAR PRODUCTS) ×1 IMPLANT
SHUNT CAROTID BYPASS 12FRX15.5 (VASCULAR PRODUCTS) IMPLANT
SPECIMEN JAR SMALL (MISCELLANEOUS) ×2 IMPLANT
SPONGE GAUZE 2X2 STER 10/PKG (GAUZE/BANDAGES/DRESSINGS) ×1
SPONGE SURGIFOAM ABS GEL 100 (HEMOSTASIS) IMPLANT
SUT ETHILON 3 0 PS 1 (SUTURE) IMPLANT
SUT PROLENE 6 0 CC (SUTURE) ×2 IMPLANT
SUT PROLENE 7 0 BV 1 (SUTURE) IMPLANT
SUT VIC AB 3-0 SH 27 (SUTURE) ×2
SUT VIC AB 3-0 SH 27X BRD (SUTURE) ×1 IMPLANT
SUT VICRYL 4-0 PS2 18IN ABS (SUTURE) ×2 IMPLANT
SYR CONTROL 10ML LL (SYRINGE) IMPLANT
TAPE PAPER 2X10 WHT MICROPORE (GAUZE/BANDAGES/DRESSINGS) ×1 IMPLANT
TOWEL OR 17X24 6PK STRL BLUE (TOWEL DISPOSABLE) ×2 IMPLANT
TOWEL OR 17X26 10 PK STRL BLUE (TOWEL DISPOSABLE) ×2 IMPLANT
TRAY FOLEY CATH 14FRSI W/METER (CATHETERS) ×2 IMPLANT
WATER STERILE IRR 1000ML POUR (IV SOLUTION) ×2 IMPLANT

## 2012-05-11 NOTE — Consult Note (Signed)
Reason for Consult: Possible neck mass  Referring Physician: Dr. Beecher Mcardle Caroline [name] is an 77 y.o. female.  HPI: Here for carotid endarterectomy on the left-hand side. History of smoking. Possible neck mass identified during the procedure.  Past Medical History  Diagnosis Date  . Hypertension   . GERD (gastroesophageal reflux disease)   . Cerebrovascular disease   . Peripheral vascular disease     40-59% left carotid stenosis, 0-39% right  . PAD (peripheral artery disease)     lower extremity PAD with bilateral leg stenting  . TIA (transient ischemic attack)   . COPD (chronic obstructive pulmonary disease)   . CVA 10/25/2008    Qualifier: Diagnosis of  By: Marciano Sequin      Past Surgical History  Procedure Date  . Appendectomy   . Partial hysterectomy   . Gallbladder surgery   . Cholecystectomy     History reviewed. No pertinent family history.  Social History:  reports that she has been smoking.  She uses smokeless tobacco. She reports that she does not drink alcohol. Her drug history not on file.  Allergies:  Allergies  Allergen Reactions  . Motrin (Ibuprofen)     Gi upset  . Albuterol     Confusion and shortness of breath  . Amoxicillin Hives  . Cephalexin Hives  . Codeine     confusion  . Entex Pac (Pseudoephedrine-Gg & Dm)   . Etanercept     vertigo  . Fluticasone-Salmeterol     Confusion and shortness of breath  . Gualenic Acid   . Lorazepam     hallucinations  . Nubain (Nalbuphine Hcl)   . Oxycodone Hcl Nausea And Vomiting  . Oxycodone-Acetaminophen Nausea And Vomiting    Medications: I have reviewed the patient's current medications.  Results for orders placed during the hospital encounter of 05/02/12 (from the past 48 hour(s))  HEPARIN LEVEL (UNFRACTIONATED)     Status: Normal   Collection Time   05/10/12  6:40 AM      Component Value Range Comment   Heparin Unfractionated 0.50  0.30 - 0.70 IU/mL   CBC     Status: Normal   Collection  Time   05/10/12  1:05 PM      Component Value Range Comment   WBC 7.1  4.0 - 10.5 K/uL    RBC 4.93  3.87 - 5.11 MIL/uL    Hemoglobin 14.5  12.0 - 15.0 g/dL    HCT 21.3  08.6 - 57.8 %    MCV 86.6  78.0 - 100.0 fL    MCH 29.4  26.0 - 34.0 pg    MCHC 34.0  30.0 - 36.0 g/dL    RDW 46.9  62.9 - 52.8 %    Platelets 153  150 - 400 K/uL   HEPARIN LEVEL (UNFRACTIONATED)     Status: Abnormal   Collection Time   05/11/12  5:50 AM      Component Value Range Comment   Heparin Unfractionated 0.23 (*) 0.30 - 0.70 IU/mL   BASIC METABOLIC PANEL     Status: Abnormal   Collection Time   05/11/12  5:50 AM      Component Value Range Comment   Sodium 139  135 - 145 mEq/L    Potassium 4.4  3.5 - 5.1 mEq/L    Chloride 104  96 - 112 mEq/L    CO2 28  19 - 32 mEq/L    Glucose, Bld 85  70 - 99  mg/dL    BUN 10  6 - 23 mg/dL    Creatinine, Ser 1.61  0.50 - 1.10 mg/dL    Calcium 9.1  8.4 - 09.6 mg/dL    GFR calc non Af Amer 88 (*) >90 mL/min    GFR calc Af Amer >90  >90 mL/min   CBC     Status: Abnormal   Collection Time   05/11/12  5:50 AM      Component Value Range Comment   WBC 7.3  4.0 - 10.5 K/uL    RBC 5.04  3.87 - 5.11 MIL/uL    Hemoglobin 15.1 (*) 12.0 - 15.0 g/dL    HCT 04.5  40.9 - 81.1 %    MCV 86.7  78.0 - 100.0 fL    MCH 30.0  26.0 - 34.0 pg    MCHC 34.6  30.0 - 36.0 g/dL    RDW 91.4  78.2 - 95.6 %    Platelets 150  150 - 400 K/uL     No results found.   Blood pressure 127/72, pulse 66, temperature 97.9 F (36.6 C), temperature source Oral, resp. rate 18, height 5\' 4"  (1.626 m), weight 126 lb 1.7 oz (57.2 kg), SpO2 96.00%.  PHYSICAL EXAM:  This is an intraoperative consultation, so the examination is limited to the left neck wound. Just above the posterior belly of the digastric muscle is the mass in question.  It is consistent with a ptotic submandibular gland. It is soft and there is no specific firm mass identified within the gland. There may be something soft just above the gland  pushing the gland down but it is not hard and I have very little concern for anything pathologic. Due to the surgical drapes, I am unable to inspect the contralateral side.  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 I would like to reevaluate her in about 1 month so that I can inspect and palpate both sides of the neck to see if there is any asymmetry. If there is, then either imaging and/or FNA would be indicated.  Dewel Lotter 05/11/2012, 11:26 AM

## 2012-05-11 NOTE — Anesthesia Procedure Notes (Signed)
Procedure Name: Intubation Date/Time: 05/11/2012 10:09 AM Performed by: Jefm Miles E Pre-anesthesia Checklist: Patient identified, Timeout performed, Emergency Drugs available, Suction available and Patient being monitored Patient Re-evaluated:Patient Re-evaluated prior to inductionOxygen Delivery Method: Circle system utilized Preoxygenation: Pre-oxygenation with 100% oxygen Intubation Type: IV induction Ventilation: Mask ventilation without difficulty Laryngoscope Size: Mac and 4 Grade View: Grade I Tube type: Oral Tube size: 7.0 mm Number of attempts: 1 Airway Equipment and Method: Stylet Placement Confirmation: ETT inserted through vocal cords under direct vision,  breath sounds checked- equal and bilateral and positive ETCO2 Secured at: 20 cm Tube secured with: Tape Dental Injury: Teeth and Oropharynx as per pre-operative assessment

## 2012-05-11 NOTE — H&P (View-Only) (Signed)
Patient ID: Caroline [name], female   DOB: 05/04/1935, 77 y.o.   MRN: [id number] No new neurologic changes. Discussed time for surgery in the morning with the patient who agrees. She will have her heparin held on call to the OR.

## 2012-05-11 NOTE — Transfer of Care (Signed)
Immediate Anesthesia Transfer of Care Note  Patient: Caroline [name]  Procedure(s) Performed: Procedure(s) (LRB) with comments: ENDARTERECTOMY CAROTID (Left) PATCH ANGIOPLASTY (Left) - with Dacron Patch Angioplasty  Patient Location: PACU  Anesthesia Type:General  Level of Consciousness: awake, alert  and oriented  Airway & Oxygen Therapy: Patient Spontanous Breathing and Patient connected to nasal cannula oxygen  Post-op Assessment: Report given to PACU RN and Patient moving all extremities X 4  Post vital signs: Reviewed and stable  Complications: No apparent anesthesia complications

## 2012-05-11 NOTE — Progress Notes (Signed)
[facility] PROGRESS NOTE  Caroline [name]:[id number] DOB: [birth year] DOA: 05/02/2012 PCP: Cassell Smiles., MD  Addendum: Brief HPI 77 y/o with h/o severe PVD that presented with acute onset of left facial weakness involving both upper and lower facial muscles lasting for 10-15 minutes.  After evaluation by neurologist patient was known to have L ICA stenosis but has got worse and Neurology recommended adjustment of symptomatic L ICA stenosis.  Subsequently Vascular surgery was consulted. Patient was taken off pradaxa and placed on heparin IV.  Scheduled for LCEA 05/11/12.  Assessment/Plan: 1. LICA stenosis - VVS on board and managing scheduled for LCEA today 05/11/2012 patient is s/p day 0. - Anticoagulation per VVS   2. Hypokalemia -resolved. Last potassium 4.4  3. COPD - Stable at this juncture  4. Atrial fibrillation - Stable on Verapamil - Continue to monitor heart rate and adjust medication pending heart rate while in house.  - on heparin at this juncture.  Code Status: DNR Family Communication: Spoke with patient and family in room Disposition Plan: Pending recommendations from vascular surgeon.   Consultants:  VVS: Dr. Darrick Penna  Procedures: Arch aortogram with bilateral carotid angiogram, right iliac angiogram, selective left subclavian angiogram 05/06/12  Antibiotics:  None  HPI/Subjective: Pt is s/p procedure no reported complications.  Some discomfort at neck.  No other complaints currently.  Objective: Filed Vitals:   05/11/12 1454 05/11/12 1455 05/11/12 1456 05/11/12 1525  BP: 115/66   117/58  Pulse: 65 81 75 74  Temp:    97.5 F (36.4 C)  TempSrc:    Oral  Resp: 18 20 18 18   Height:    5\' 4"  (1.626 m)  Weight:      SpO2: 91% 92% 92% 95%    Intake/Output Summary (Last 24 hours) at 05/11/12 1605 Last data filed at 05/11/12 1455  Gross per 24 hour  Intake   1790 ml  Output    635 ml  Net   1155 ml   Filed Weights   05/04/12 2000  05/08/12 0500 05/09/12 0600  Weight: 51.71 kg (114 lb) 54.885 kg (121 lb) 57.2 kg (126 lb 1.7 oz)    Exam:   General:  Pt in NAD, Alert and Awake  Cardiovascular: S1 and S2, no murmurs  Respiratory: CTA BL, no wheezes  Abdomen: soft, NT, ND  Skin: dressing over left anterolateral neck no active bleeding.  Data Reviewed: Basic Metabolic Panel:  Lab 05/11/12 8119 05/06/12 0640  NA 139 138  K 4.4 4.7  CL 104 100  CO2 28 33*  GLUCOSE 85 84  BUN 10 10  CREATININE 0.58 0.68  CALCIUM 9.1 9.2  MG -- --  PHOS -- --   Liver Function Tests: No results found for this basename: AST:5,ALT:5,ALKPHOS:5,BILITOT:5,PROT:5,ALBUMIN:5 in the last 168 hours No results found for this basename: LIPASE:5,AMYLASE:5 in the last 168 hours No results found for this basename: AMMONIA:5 in the last 168 hours CBC:  Lab 05/11/12 0550 05/10/12 1305 05/08/12 0638 05/06/12 0640 05/05/12 0200  WBC 7.3 7.1 7.6 6.2 7.0  NEUTROABS -- -- -- -- --  HGB 15.1* 14.5 15.4* 16.9* 16.3*  HCT 43.7 42.7 45.3 49.5* 46.2*  MCV 86.7 86.6 86.5 87.3 84.5  PLT 150 153 136* 169 159   Cardiac Enzymes: No results found for this basename: CKTOTAL:5,CKMB:5,CKMBINDEX:5,TROPONINI:5 in the last 168 hours BNP (last 3 results) No results found for this basename: PROBNP:3 in the last 8760 hours CBG:  Lab 05/06/12 1011  GLUCAP 93  No results found for this or any previous visit (from the past 240 hour(s)).   Studies: No results found.  Scheduled Meds:    . aspirin EC  325 mg Oral Daily  . atorvastatin  80 mg Oral q1800  . docusate sodium  100 mg Oral Daily  . pantoprazole  40 mg Oral Daily  . sodium chloride  3 mL Intravenous Q12H  . tiotropium  18 mcg Inhalation Daily  . [COMPLETED] vancomycin  1,000 mg Intravenous Once  . vancomycin  1,000 mg Intravenous Q12H  . verapamil  240 mg Oral Daily   Continuous Infusions:    . sodium chloride 100 mL/hr at 05/11/12 0028  . DOPamine    . heparin 850 Units/hr  (05/11/12 1602)  . [DISCONTINUED] heparin 850 Units/hr (05/10/12 1941)    Principal Problem:  *Stroke Active Problems:  TOBACCO ABUSE  FIBRILLATION, ATRIAL  CAROTID ARTERY DISEASE  PVD  Hypokalemia  COPD (chronic obstructive pulmonary disease)  Hemiballismus  TIA (transient ischemic attack)    Time spent:    Penny Pia  [facility] Pager [phone]. If 8PM-8AM, please contact night-coverage at [email or web address], password [facility] 05/11/2012, 4:05 PM  LOS: 9 days

## 2012-05-11 NOTE — Progress Notes (Signed)
Pt admitted to 3300 from PACU.  Pt oriented to unit, room, and call bell - placed at pt's side.  Neuro intact.  VSS.  Family at bedside.  Roselie Awkward, RN

## 2012-05-11 NOTE — Anesthesia Postprocedure Evaluation (Signed)
  Anesthesia Post-op Note  Patient: Caroline [name]  Procedure(s) Performed: Procedure(s) (LRB) with comments: ENDARTERECTOMY CAROTID (Left) PATCH ANGIOPLASTY (Left) - with Dacron Patch Angioplasty  Patient Location: PACU  Anesthesia Type:General  Level of Consciousness: awake  Airway and Oxygen Therapy: Patient Spontanous Breathing  Post-op Pain: mild  Post-op Assessment: Post-op Vital signs reviewed, Patient's Cardiovascular Status Stable, Respiratory Function Stable, Patent Airway, No signs of Nausea or vomiting and Pain level controlled  Post-op Vital Signs: stable  Complications: No apparent anesthesia complications

## 2012-05-11 NOTE — Interval H&P Note (Signed)
History and Physical Interval Note:  05/11/2012 8:38 AM  Caroline [name]  has presented today for surgery, with the diagnosis of Left Internal Carotid Artery Stenosis  The various methods of treatment have been discussed with the patient and family. After consideration of risks, benefits and other options for treatment, the patient has consented to  Procedure(s) (LRB) with comments: ENDARTERECTOMY CAROTID (Left) as a surgical intervention .  The patient's history has been reviewed, patient examined, no change in status, stable for surgery.  I have reviewed the patient's chart and labs.  Questions were answered to the patient's satisfaction.     [name]

## 2012-05-11 NOTE — Preoperative (Signed)
Beta Blockers   Reason not to administer Beta Blockers:Not Applicable 

## 2012-05-11 NOTE — Progress Notes (Signed)
Physical Therapy Cancellation   05/11/12 1339  PT Visit Information  Last PT Received On 05/11/12  Reason Eval/Treat Not Completed Patient at procedure or test/unavailable     05/11/2012 Veda Canning, PT Pager: [phone]

## 2012-05-11 NOTE — Op Note (Signed)
Procedure Left carotid endarterectomy  Preoperative diagnosis: High-grade symptomatic left internal carotid artery stenosis  Postoperative diagnosis: Same  Anesthesia General  Asst.: Doreatha Massed, [facility]  Intraoperative consult: Allegra Grana, MD  Operative findings: #1 greater than 80% left internal carotid artery stenosis with calcified plaque                                #2 10 French shunt                                #3 Dacron patch                                #4 laxity of left submandibular gland evaluated by Dr Pollyann Kennedy  Operative details: After obtaining informed consent, the patient was taken to the operating room. The patient was placed in a supine position on the operating room table. After induction of general anesthesia and endotracheal intubation a Foley catheter was placed. Next the patient's entire left neck and chest was prepped and draped in the usual sterile fashion. An oblique incision was made on the left aspect of the patient's neck anterior to the border the left sternocleidomastoid muscle. The incision was carried into the subcutaneous tissues and through the platysma. The sternocleidomastoid muscle was identified and reflected laterally. There was a large ovoid structure which was soft in character overlying the carotid.  I initially thought this might be and enlarged lymph node.  Dr Pollyann Kennedy from ENT was called to evaluate this structure.  It was thought to represent a laxity of attachments of the submandibular gland.  It was reflected medially. The common carotid artery was then found at the base of the incision this was dissected free circumferentially. The vagus nerve was identified and protected. Dissection was then carried up to the level carotid bifurcation. The Ansa cervicalis was identified and traced up to its insertion into the hypoglossal nerve.  The ansa was divided at this level to assist with exposure.  The hyperglossal nerve was identified and protected. The  internal carotid artery was dissected free circumferentially just below the level of the hypoglossal nerve and the posterior belly of the digastric muscle.  The artery was soft in character at this location and above any palpable disease. A vessel loop was placed around this. Next the external carotid was dissected free circumferentially and vessel loops were placed around this.  The artery was rotated over severely so I divided the superior thyroid artery between silk ties in order to rotate the carotid to a more normal anatomic position.  The patient was given 7000 units of intravenous heparin. After 2 minutes of circulation time and raising the mean arterial pressure to 85 mm mercury, the distal internal carotid artery was controlled with small bulldog clamp. The external arteries was controlled with vessel loops. The common carotid artery was controlled with a peripheral DeBakey clamp. A longitudinal opening was made in the common carotid artery just below the bifurcation. The arteriotomy was extended distally up into the internal carotid with Potts scissors. There was a large calcified plaque with greater than 80% stenosis at the carotid bifurcation. The arteriotomy was extended past the level of stenosis. Next a 10 [place] shunt was brought in the operative field was threaded into the distal internal carotid artery and allowed  to backbleed thoroughly. There was brisk backbleeding from the internal carotid artery around the shunt requiring a distal Javid shunt clamp.  This shunt was then threaded into the common carotid artery and secured with a Rummel tourniquet. The shunt was inspected and found to be free of air. Flow was then restored to the brain after approximately 6 minutes of ischemia time.  Attention was then turned to the common carotid artery once again. A suitable endarterectomy plane was obtained and endarterectomy was begun in the common carotid artery and a good proximal endpoint was obtained. An  eversion endarterectomy was performed on the external carotid artery and a good endpoint was obtained. The plaque was then elevated in the internal carotid artery and a nice feathered distal endpoint was also obtained. The plaque was passed off the table as a specimen. All loose debris was then removed from the carotid bed and everything was thoroughly irrigated with heparinized saline. A Dacron patch was then brought on to the operative field and this was sewn on as a patch angioplasty using a running 6-0 Prolene suture. Prior to completion of the anastomosis the shunt was reoccluded and brought down out of the distal internal carotid artery. The internal carotid artery was thoroughly backbled. This was then controlled again with small bulldog clamp. The shunt was then removed from the proximal common carotid artery and this is again secured with a peripheral DeBakey clamp after forward bleeding. The external carotid was also thoroughly backbled.  The remainder of the patch was completed and the anastomosis was secured. Flow was then restored first retrograde from the external carotid into the carotid bed then antegrade from the common carotid to the external carotid artery and after approximately 5 cardiac cycles to the internal carotid artery. Doppler was used to evaluate the external/internal and common carotid arteries and these all had good Doppler flow. Hemostasis was obtained with direct pressure.    The platysma muscle was reapproximated using a running 3-0 Vicryl suture. The skin was closed with 4 0 Vicryl subcuticular stitch.  The patient was awakened in the operating room and was moving upper and lower extremities symmetrically and following commands.  The patient was stable on arrival to the PACU.   The patient will have follow up eval by Dr Pollyann Kennedy as outpt 4-6 weeks.  Fabienne Bruns, MD [facility] [place] Office: [phone] Pager: [phone]

## 2012-05-11 NOTE — Anesthesia Preprocedure Evaluation (Addendum)
Anesthesia Evaluation  Patient identified by MRN, date of birth, ID band Patient awake    Reviewed: Allergy & Precautions, H&P , NPO status , Patient's Chart, lab work & pertinent test results  Airway Mallampati: I TM Distance: >3 FB Neck ROM: Full    Dental No notable dental hx. (+) Edentulous Upper, Edentulous Lower and Dental Advisory Given   Pulmonary COPD breath sounds clear to auscultation  Pulmonary exam normal       Cardiovascular hypertension, negative cardio ROS  + dysrhythmias Atrial Fibrillation     Neuro/Psych PSYCHIATRIC DISORDERS TIACVA    GI/Hepatic Neg liver ROS, GERD-  Medicated and Controlled,  Endo/Other  negative endocrine ROS  Renal/GU negative Renal ROS  negative genitourinary   Musculoskeletal   Abdominal   Peds  Hematology negative hematology ROS (+)   Anesthesia Other Findings   Reproductive/Obstetrics negative OB ROS                          Anesthesia Physical Anesthesia Plan  ASA: III  Anesthesia Plan: General   Post-op Pain Management:    Induction: Intravenous  Airway Management Planned: Oral ETT  Additional Equipment: Arterial line  Intra-op Plan:   Post-operative Plan: Extubation in OR  Informed Consent: I have reviewed the patients History and Physical, chart, labs and discussed the procedure including the risks, benefits and alternatives for the proposed anesthesia with the patient or authorized representative who has indicated his/her understanding and acceptance.   Dental advisory given  Plan Discussed with: CRNA and Surgeon  Anesthesia Plan Comments:         Anesthesia Quick Evaluation

## 2012-05-12 ENCOUNTER — Encounter (HOSPITAL_COMMUNITY): Payer: Self-pay | Admitting: Vascular Surgery

## 2012-05-12 LAB — BASIC METABOLIC PANEL
CO2: 27 mEq/L (ref 19–32)
Chloride: 102 mEq/L (ref 96–112)
Glucose, Bld: 85 mg/dL (ref 70–99)
Potassium: 4.1 mEq/L (ref 3.5–5.1)
Sodium: 137 mEq/L (ref 135–145)

## 2012-05-12 LAB — CBC
HCT: 38.9 % (ref 36.0–46.0)
Hemoglobin: 13.2 g/dL (ref 12.0–15.0)
RBC: 4.49 MIL/uL (ref 3.87–5.11)

## 2012-05-12 MED ORDER — DABIGATRAN ETEXILATE MESYLATE 150 MG PO CAPS
150.0000 mg | ORAL_CAPSULE | Freq: Two times a day (BID) | ORAL | Status: DC
  Administered 2012-05-12 – 2012-05-13 (×2): 150 mg via ORAL
  Filled 2012-05-12 (×3): qty 1

## 2012-05-12 MED ORDER — ASPIRIN 81 MG PO CHEW
81.0000 mg | CHEWABLE_TABLET | Freq: Once | ORAL | Status: AC
  Administered 2012-05-12: 81 mg via ORAL
  Filled 2012-05-12: qty 1

## 2012-05-12 MED ORDER — VERAPAMIL HCL ER 240 MG PO TBCR
240.0000 mg | EXTENDED_RELEASE_TABLET | Freq: Every day | ORAL | Status: DC
  Administered 2012-05-12 – 2012-05-13 (×2): 240 mg via ORAL
  Filled 2012-05-12 (×2): qty 1

## 2012-05-12 MED ORDER — SODIUM CHLORIDE 0.9 % IV BOLUS (SEPSIS)
500.0000 mL | Freq: Once | INTRAVENOUS | Status: AC
  Administered 2012-05-12: 500 mL via INTRAVENOUS

## 2012-05-12 NOTE — Progress Notes (Addendum)
VASCULAR AND VEIN SURGERY POST - OP [facility] PROGRESS NOTE  Date of Surgery: 05/02/2012 - 05/11/2012 Surgeon: Moishe Spice): Sherren Kerns, MD 1 Day Post-Op left Carotid Endarterectomy .  HPI: Caroline [name] is a 77 y.o. female who is 1 Day Post-Op left Carotid Endarterectomy . Patient is doing well. Pre-operative symptoms are Unchanged Patient denies headache; Patient denies difficulty swallowing; denies weakness in upper or lower extremities; Pt. denies other symptoms of stroke or TIA.  IMAGING: No results found.  Significant Diagnostic Studies: CBC Lab Results  Component Value Date   WBC 8.2 05/12/2012   HGB 13.2 05/12/2012   HCT 38.9 05/12/2012   MCV 86.6 05/12/2012   PLT 142* 05/12/2012    BMET    Component Value Date/Time   NA 137 05/12/2012 0359   K 4.1 05/12/2012 0359   CL 102 05/12/2012 0359   CO2 27 05/12/2012 0359   GLUCOSE 85 05/12/2012 0359   BUN 7 05/12/2012 0359   CREATININE 0.49* 05/12/2012 0359   CALCIUM 8.5 05/12/2012 0359   GFRNONAA >90 05/12/2012 0359   GFRAA >90 05/12/2012 0359    COAG Lab Results  Component Value Date   INR 0.97 05/06/2012   INR 1.30 02/23/2010   INR 2.4* 05/29/2008   No results found for this basename: PTT      Intake/Output Summary (Last 24 hours) at 05/12/12 0742 Last data filed at 05/12/12 0500  Gross per 24 hour  Intake 3458.5 ml  Output   2035 ml  Net 1423.5 ml    Physical Exam:  BP Readings from Last 3 Encounters:  05/12/12 129/61  05/12/12 129/61  05/12/12 129/61   Temp Readings from Last 3 Encounters:  05/12/12 98.3 F (36.8 C) Oral  05/12/12 98.3 F (36.8 C) Oral  05/12/12 98.3 F (36.8 C) Oral   SpO2 Readings from Last 3 Encounters:  05/12/12 97%  05/12/12 97%  05/12/12 97%   Pulse Readings from Last 3 Encounters:  05/12/12 104  05/12/12 104  05/12/12 104    Pt is A&O x 3 Gait is stable Speech is fluent left Neck Wound is healing well Patient with Negative tongue deviation and Positive facial droop  as pre-op Pt has good and equal strength in all extremities  Assessment: Caroline [name] is a 77 y.o. female is S/P Left Carotid endarterectomy Pt is voiding, ambulating and taking po well A.Fib - pt on verapamil Start pradaxa today   Plan: Discharge to: Home when OK with med sx Follow-up in 2 weeks -our office will arrange  Clinton [facility] [phone] 05/12/2012 7:42 AM  No complaints No neck hematoma Neuro tongue midline no swallow difficulty UE/LE 5/5 motor  Will give one 81 mg aspirin today only before discharge. She will take her Pradaxa as prior to admission starting this evening. Needs better afib heart rate control prior to d/c  Fabienne Bruns, MD [facility] [place] Office: [phone] Pager: [phone]

## 2012-05-12 NOTE — Discharge Summary (Signed)
Physician Discharge Summary  Caroline [name] ZOX:[id number] DOB: Dec 27, 1934 DOA: 05/02/2012  PCP: Caroline [name]., MD  Admit date: 05/02/2012 Discharge date: 05/12/2012  Time spent: >35 minutes  Recommendations for Outpatient Follow-up:  1. Please be sure to follow up with your PCP in 1-2 weeks or sooner should you have any new concerns 2. Also follow up with vascular surgeon per his recommendations 3. Continue taking Plavix and pletol 4. Please review blood pressure medication and adjust medication pending blood pressures  Discharge Diagnoses:  Principal Problem:  *Stroke Active Problems:  TOBACCO ABUSE  FIBRILLATION, ATRIAL  CAROTID ARTERY DISEASE  PVD  Hypokalemia  COPD (chronic obstructive pulmonary disease)  Hemiballismus  TIA (transient ischemic attack)   Discharge Condition: Stable  Diet recommendation: Heart healthy  Filed Weights   05/04/12 2000 05/08/12 0500 05/09/12 0600  Weight: 51.71 kg (114 lb) 54.885 kg (121 lb) 57.2 kg (126 lb 1.7 oz)    History of present illness:  From original HPI: Caroline [name] is a 77 y.o. female with severe PVD with bilateral lower extremity stents, advanced COPD, A-Fib, ongoing tobacco abuse and known CVD/previous lacunar strokes presented to [facility] c/o acute onset left facial numbness, "drawing of her left face", and eye lid droop that started after waking this morning, became progressively worse and she began to have right arm symptoms of a clumsy hand, and arm spontaneously twitching and jumping. She is on Pradaxa for stroke prevention in setting of chronic rate controlled A-Fib and Pletal for her PVD. She reports complete and accurate medication administration and adherence.  Patient has prior history of stroke remotely and evidence of chronic lacunar stroke on her CT scan- at baseline she has a clumsy right hand and difficulty writing from her old lacunar stroke, but this is much worse today. EDP requested admission for stroke  work-up and further diagnostic evaluation, however patient is refusing MRI, adamantly and will not take medication for anxiety. She has agreed to come into hospital overnight for monitoring on tele and neuro-checks as well as to optimize medical secondary prevention regimen.  Hospital Course:  1. LICA stenosis - VVS on board and managed while in house. Scheduled for LCEA 05/11/2012 patient is s/p day 1.  - Anticoagulation per VVS and they have recommended ASA 81 mg po x 1 on pod # 1 AM with continuation of her pradaxa this evening 05/12/12.  - F/u as per recommendations by surgeon  2. Hypokalemia  -Resolved after replacement. Last potassium 4.1   3. COPD  - Stable at this juncture  - Patient is to continue her home regimen once transitioned home. - Recommend patient follow up with PCP or pulmonary medical doctor. - With activity on RA saturations did not go below 88 %    4. Atrial fibrillation  - Heart rate on POD # 1 AM was not well controlled but patient was off of her verapimil - Restarted verapimil and heart rate improved with rates reportedly in 80's-110  5. While in the OR for procedure by vascular surgeon patient was found to have a neck mass vascular surgeon consulted ENT for further recommendations.  ENT physician evaluated patient and suspected ptotic submandibular gland, of no clinical significance. ENT preference is to follow up with patient in 1 month to inspect and palpate both sides of neck to look for asymmetry.   Procedures:  L CEA on 05/11/2012  Consultations:  Vascular surgeon: Caroline [name]  ENT: Caroline [name]  Discharge Exam: Caroline [name] Vitals:  05/12/12 0000 05/12/12 0022 05/12/12 0400 05/12/12 0415  BP: 147/75 114/89 133/76 129/61  Pulse: 88 92 125 104  Temp:  97.3 F (36.3 C)  98.3 F (36.8 C)  TempSrc:  Oral  Oral  Resp: 13 18 17 14   Height:      Weight:      SpO2: 100% 97% 87% 97%    General: Pt in NAD, Alert and Awake Cardiovascular: Irregular,  normal S1 and S2 no gallops Respiratory: CTA BL, no wheezes, prolonged expiratory phase.  Discharge Instructions  Discharge Orders    Future Orders Please Complete By Expires   Diet - low sodium heart healthy      Increase activity slowly      Call MD for:  temperature >100.4      Call MD for:  difficulty breathing, headache or visual disturbances      Call MD for:  persistant dizziness or light-headedness      Call MD for:  extreme fatigue          Medication List     As of 05/12/2012  7:56 AM    STOP taking these medications         hydrochlorothiazide 12.5 MG tablet   Commonly known as: HYDRODIURIL      TAKE these medications         cilostazol 100 MG tablet   Commonly known as: PLETAL   Take 50 mg by mouth 2 (two) times daily.      dabigatran 150 MG Caps   Commonly known as: PRADAXA   Take 1 capsule (150 mg total) by mouth every 12 (twelve) hours.      verapamil 240 MG CR tablet   Commonly known as: CALAN-SR   Take 240 mg by mouth daily. At lunch           Follow-up Information    Follow up with Sherren Kerns, MD. In 2 weeks. (our office will arrange-sent)    Contact information:   [address] [place] [address] [phone]           The results of significant diagnostics from this hospitalization (including imaging, microbiology, ancillary and laboratory) are listed below for reference.    Significant Diagnostic Studies: Dg Chest 2 View  05/02/2012  *RADIOLOGY REPORT*  Clinical Data: Shortness of breath and stroke symptoms.  CHEST - 2 VIEW  Comparison: 04/23/2012  Findings: Normal heart size and pulmonary vascularity. Hyperinflation suggests emphysema.  Fibrosis in the lung bases. Old left rib fractures.  No focal airspace consolidation.  No pneumothorax.  No blunting of costophrenic angles.  Mediastinal contours are intact.  Calcification of the aorta.  Stable appearance since previous study.  IMPRESSION: Chronic emphysematous changes and  fibrosis in the lungs.  No evidence of active pulmonary disease.   Original Report Authenticated By: Caroline [name], M.D.    Dg Chest 2 View  04/24/2012  *RADIOLOGY REPORT*  Clinical Data: Cough.  Weakness.  COPD.  CHEST - 2 VIEW  Comparison: 07/10/2011  Findings: Hyperinflation/COPD.  Mild osteopenia.  Aortic atherosclerosis.  Remote left rib fracture. Midline trachea.  Normal heart size. Aortic atherosclerosis No pleural effusion or pneumothorax.  Lower lobe predominant interstitial thickening.  This is asymmetric, and greater on the left than right.  There may be mild concurrent left base scarring.  No correlate consolidation on the lateral view. This appearance is quite similar to the exam of 02/23/2010.  IMPRESSION: COPD/chronic bronchitis with probable scarring at the left lung  base. No acute superimposed process.   Original Report Authenticated By: Consuello Bossier, M.D.    Ct Head Wo Contrast  05/02/2012  *RADIOLOGY REPORT*  Clinical Data: 77 year old female with extremity weakness.  CT HEAD WITHOUT CONTRAST  Technique:  Contiguous axial images were obtained from the base of the skull through the vertex without contrast.  Comparison: 05/29/2008.  Findings: Mucosal thickening and bubbly opacity in the sphenoid sinuses.  Other Visualized paranasal sinuses and mastoids are clear.  No acute osseous abnormality identified.  Visualized orbits and scalp soft tissues are within normal limits.  Small chronic left superior frontal gyrus cortical infarct on series 2 image 20 appears not significantly changed.  Stable cerebral volume.  No ventriculomegaly.  Chronic-appearing lacunar infarct at the anterior limbs of the right external and internal capsule is new from prior. No evidence of cortically based acute infarction identified.  No midline shift, mass effect, or evidence of mass lesion.  No acute intracranial hemorrhage identified.  No suspicious intracranial vascular hyperdensity.  IMPRESSION: 1.   Chronic small and medium-sized vessel ischemia.  No acute infarct identified. 2.  Mild acute sphenoid sinus inflammatory changes.   Original Report Authenticated By: Harley Hallmark, M.D.    Mr Brain Wo Contrast  05/04/2012  *RADIOLOGY REPORT*  Clinical Data: 77 year old female confusion, weakness, possible seizure.  MRI HEAD WITHOUT CONTRAST  Technique:  Multiplanar, multiecho pulse sequences of the brain and surrounding structures were obtained according to standard protocol without intravenous contrast.  Comparison: Head CTs 05/02/2012 and earlier.  Findings: No restricted diffusion to suggest acute infarction.  No midline shift, mass effect, evidence of mass lesion, ventriculomegaly, extra-axial collection or acute intracranial hemorrhage.  Cervicomedullary junction and pituitary are within normal limits.  Major intracranial vascular flow voids are preserved.  Small chronic cortically based infarct in the left superior frontal gyrus motor strip (series 6 image 20).  Other nonspecific mild for age scattered white matter T2 and FLAIR hyperintensity.  There is evidence of a chronic lacunar infarct in the right thalamus. Brainstem, cerebellum, and visible cervical spine are within normal limits for age.  Normal marrow signal.  Visualized orbit soft tissues are within normal limits.  Paranasal sinuses are clear.  Mild right mastoid fluid.  Trace retained secretions in the nasopharynx.  Clear left mastoids.  Negative scalp soft tissues.  IMPRESSION: 1. No acute intracranial abnormality. 2.  Chronic small and medium-sized vessel ischemia. 3.  Mild right mastoid effusion.   Original Report Authenticated By: Harley Hallmark, M.D.    US Carotid Duplex Bilateral  05/03/2012  *RADIOLOGY REPORT*  Clinical Data: Carotid stenosis, hypertension, cerebrovascular disease.  BILATERAL CAROTID DUPLEX ULTRASOUND  Technique: Wallace Cullens scale imaging, color Doppler and duplex ultrasound was performed of bilateral carotid and vertebral  arteries in the neck.  Comparison: None available  Criteria:  Quantification of carotid stenosis is based on velocity parameters that correlate the residual internal carotid diameter with NASCET-based stenosis levels, using the diameter of the distal internal carotid lumen as the denominator for stenosis measurement.  The following velocity measurements were obtained:                   PEAK SYSTOLIC/END DIASTOLIC RIGHT ICA:                        81/24cm/sec CCA:                        81/20cm/sec  SYSTOLIC ICA/CCA RATIO:     1.0 DIASTOLIC ICA/CCA RATIO:    1.1 ECA:                        93cm/sec  LEFT ICA:                        333/83cm/sec CCA:                        55/17cm/sec SYSTOLIC ICA/CCA RATIO:     6.05 DIASTOLIC ICA/CCA RATIO:    4.99 ECA:                        184cm/sec  Findings:  RIGHT CAROTID ARTERY: There is mild diffuse irregular partially calcified plaque in the mid and distal common carotid artery, bulb, and proximal ICA without high-grade stenosis.  Normal wave forms and color Doppler signal.  RIGHT VERTEBRAL ARTERY:  Normal flow direction and waveform.  LEFT CAROTID ARTERY: Moderate eccentric irregular partially calcified plaque in the mid and distal common carotid artery.  More heavily calcified plaque effaces the carotid bulb and extends into the proximal ICA.  There is at least moderate short segment stenosis.  There is focal aliasing just distal to this segment on color Doppler interrogation, with markedly elevated peak systolic velocities.  In the more distal ICA, normal wave forms and color Doppler signal.  LEFT VERTEBRAL ARTERY:  Normal flow direction and waveform.  IMPRESSION:  1.  Bilateral carotid bifurcation and proximal ICA plaque, resulting in 70-99% diameter stenosis on the left, less than 50% diameter stenosis on the right.  Consider vascular surgical or neurointerventional   radiology consultation.   Original Report Authenticated By: Osa Craver, M.D.      Microbiology: Recent Results (from the past 240 hour(s))  MRSA PCR SCREENING     Status: Normal   Collection Time   05/11/12  3:34 PM      Component Value Range Status Comment   MRSA by PCR NEGATIVE  NEGATIVE Final      Labs: Basic Metabolic Panel:  Lab 05/12/12 1478 05/11/12 0550 05/06/12 0640  NA 137 139 138  K 4.1 4.4 4.7  CL 102 104 100  CO2 27 28 33*  GLUCOSE 85 85 84  BUN 7 10 10   CREATININE 0.49* 0.58 0.68  CALCIUM 8.5 9.1 9.2  MG -- -- --  PHOS -- -- --   Liver Function Tests: No results found for this basename: AST:5,ALT:5,ALKPHOS:5,BILITOT:5,PROT:5,ALBUMIN:5 in the last 168 hours No results found for this basename: LIPASE:5,AMYLASE:5 in the last 168 hours No results found for this basename: AMMONIA:5 in the last 168 hours CBC:  Lab 05/12/12 0359 05/11/12 0550 05/10/12 1305 05/08/12 0638 05/06/12 0640  WBC 8.2 7.3 7.1 7.6 6.2  NEUTROABS -- -- -- -- --  HGB 13.2 15.1* 14.5 15.4* 16.9*  HCT 38.9 43.7 42.7 45.3 49.5*  MCV 86.6 86.7 86.6 86.5 87.3  PLT 142* 150 153 136* 169   Cardiac Enzymes: No results found for this basename: CKTOTAL:5,CKMB:5,CKMBINDEX:5,TROPONINI:5 in the last 168 hours BNP: BNP (last 3 results) No results found for this basename: PROBNP:3 in the last 8760 hours CBG:  Lab 05/06/12 1011  GLUCAP 93       Signed:  Penny Pia  [facility] 05/12/2012, 7:56 AM

## 2012-05-12 NOTE — Progress Notes (Signed)
Physical Therapy Treatment Patient Details Name: Caroline [name] MRN: [id number] DOB: Mar 25, 1935 Today's Date: 05/12/2012 Time: 4098-1191 PT Time Calculation (min): 24 min  PT Assessment / Plan / Recommendation Comments on Treatment Session  Pt with increase fatigue this session due to s/p left carotid endartectomy.  Pt needed increase assistance with transfers. Pt with increase HR throughout the day maintaining 120s with ambulation however decreased to 80s after ambulation.    Follow Up Recommendations  Home health PT;Supervision for mobility/OOB     Does the patient have the potential to tolerate intense rehabilitation     Barriers to Discharge        Equipment Recommendations  None recommended by PT    Recommendations for Other Services    Frequency Min 4X/week   Plan Discharge plan remains appropriate;Frequency remains appropriate    Precautions / Restrictions Precautions Precautions: Fall Restrictions Weight Bearing Restrictions: No   Pertinent Vitals/Pain C/o SOB; return 2L O2 however unable to get read with ambulation due to poor hand circulation    Mobility  Bed Mobility Bed Mobility: Not assessed (Pt on 3n1 when entering room) Transfers Transfers: Sit to Stand;Stand to Sit Sit to Stand: 4: Min assist;From chair/3-in-1 Stand to Sit: 4: Min assist;To chair/3-in-1 Details for Transfer Assistance: (A) to initiate transfer with cues for hand placement.  Pt with increase fatigue this session since recent sx. Ambulation/Gait Ambulation/Gait Assistance: 4: Min guard;5: Supervision Ambulation Distance (Feet): 175 Feet Assistive device: Rolling walker Ambulation/Gait Assistance Details: Minguard for safety with cues for RW placement.  Pt needed several intermittent rest breaks for slow deep breathing due to decrease SaO2 however not a good waveform.   Gait Pattern: Step-through pattern;Narrow base of support Gait velocity: slow without cue    Exercises     PT  Diagnosis:    PT Problem List:   PT Treatment Interventions:     PT Goals Acute Rehab PT Goals PT Goal Formulation: With patient Time For Goal Achievement: 05/12/12 Potential to Achieve Goals: Good Pt will go Sit to Stand: with modified independence PT Goal: Sit to Stand - Progress: Not met Pt will go Stand to Sit: with modified independence PT Goal: Stand to Sit - Progress: Not met Pt will Transfer Bed to Chair/Chair to Bed: with modified independence PT Transfer Goal: Bed to Chair/Chair to Bed - Progress: Not met Pt will Ambulate: >150 feet;with modified independence;with least restrictive assistive device PT Goal: Ambulate - Progress: Progressing toward goal  Visit Information  Last PT Received On: 05/12/12 Assistance Needed: +1    Subjective Data  Subjective: "I'm not sure if I'm goin home or not my heart rate is going up.: Patient Stated Goal: home   Cognition  Overall Cognitive Status: Appears within functional limits for tasks assessed/performed Arousal/Alertness: Awake/alert Orientation Level: Appears intact for tasks assessed Behavior During Session: [facility] for tasks performed    Balance     End of Session PT - End of Session Equipment Utilized During Treatment: Gait belt Activity Tolerance: Patient tolerated treatment well Patient left: in chair;with call bell/phone within reach Nurse Communication: Mobility status   GP     Geneal Huebert 05/12/2012, 3:30 PM Jake Shark, PT DPT [phone]

## 2012-05-12 NOTE — Progress Notes (Signed)
Patient ambulated 100 feet in hall. Heart rate was from 90-118 and O2 sats were 89-92 %.  Pt had no complaints or trouble during ambulation. Pt now lying in bed and HR=88 with o2 sats=94%. Will continue to monitor.

## 2012-05-12 NOTE — Progress Notes (Signed)
[facility] PROGRESS NOTE  Caroline [name] QIO:[id number] DOB: 02-09-1935 DOA: 05/02/2012 PCP: Cassell Smiles., MD  Addendum: Brief HPI 77 y/o with h/o severe PVD that presented with acute onset of left facial weakness involving both upper and lower facial muscles lasting for 10-15 minutes.  After evaluation by neurologist patient was known to have L ICA stenosis but has got worse and Neurology recommended adjustment of symptomatic L ICA stenosis.  Subsequently Vascular surgery was consulted. Patient was taken off pradaxa and placed on heparin IV.  Scheduled for LCEA 05/11/12.  Assessment/Plan: 1. LICA stenosis - VVS on board and managing scheduled for LCEA today 05/11/2012 patient is s/p day 1. - Patient to be continued on pradaxa - Considering discharge on 05/12/12 but patient had afib with RVR (becuase her verapimil was held). After verapimil patient's heart rate was controlled but she had hypotension with > 2 readings showing a MAP of less than 65 but higher than 60.  As such I recommended observing patient closer.   2. Hypokalemia -resolved. Last potassium 4.4  3. COPD - Stable at this juncture - continue spiriva  4. Atrial fibrillation - Stable on Verapamil - Continue pradaxa - Will administer fluid bolus given a MAP of < 65 will recheck blood pressure after fluid bolus.  Code Status: DNR Family Communication: Spoke with patient and family in room Disposition Plan: Pending recommendations from vascular surgeon.   Consultants:  VVS: Dr. Darrick Penna ENT to assess suspicious mass during L CEA: Most likely a ptotic submandibular gland, of no clinical significance Pt to f/u in 1 month  Procedures: Arch aortogram with bilateral carotid angiogram, right iliac angiogram, selective left subclavian angiogram 05/06/12 L CEA 05/11/12  Antibiotics:  None  HPI/Subjective: Patient is eager to go home.  No new complaints at this time.  Objective: Filed Vitals:   05/12/12 1445  05/12/12 1509 05/12/12 1513 05/12/12 1600  BP:    90/58  Pulse: 97 118 88 83  Temp:    98 F (36.7 C)  TempSrc:    Oral  Resp: 19  16 20   Height:      Weight:      SpO2:  89% 94% 95%    Intake/Output Summary (Last 24 hours) at 05/12/12 1710 Last data filed at 05/12/12 1300  Gross per 24 hour  Intake 2181.5 ml  Output   2200 ml  Net  -18.5 ml   Filed Weights   05/04/12 2000 05/08/12 0500 05/09/12 0600  Weight: 51.71 kg (114 lb) 54.885 kg (121 lb) 57.2 kg (126 lb 1.7 oz)    Exam:   General:  Pt in NAD, Alert and Awake  Cardiovascular: S1 and S2, no murmurs  Respiratory: CTA BL, no wheezes  Abdomen: soft, NT, ND  Skin: dressing over left anterolateral neck no active bleeding.  Data Reviewed: Basic Metabolic Panel:  Lab 05/12/12 3244 05/11/12 0550 05/06/12 0640  NA 137 139 138  K 4.1 4.4 4.7  CL 102 104 100  CO2 27 28 33*  GLUCOSE 85 85 84  BUN 7 10 10   CREATININE 0.49* 0.58 0.68  CALCIUM 8.5 9.1 9.2  MG -- -- --  PHOS -- -- --   Liver Function Tests: No results found for this basename: AST:5,ALT:5,ALKPHOS:5,BILITOT:5,PROT:5,ALBUMIN:5 in the last 168 hours No results found for this basename: LIPASE:5,AMYLASE:5 in the last 168 hours No results found for this basename: AMMONIA:5 in the last 168 hours CBC:  Lab 05/12/12 0359 05/11/12 0550 05/10/12 1305 05/08/12 0102 05/06/12 7253  WBC 8.2 7.3 7.1 7.6 6.2  NEUTROABS -- -- -- -- --  HGB 13.2 15.1* 14.5 15.4* 16.9*  HCT 38.9 43.7 42.7 45.3 49.5*  MCV 86.6 86.7 86.6 86.5 87.3  PLT 142* 150 153 136* 169   Cardiac Enzymes: No results found for this basename: CKTOTAL:5,CKMB:5,CKMBINDEX:5,TROPONINI:5 in the last 168 hours BNP (last 3 results) No results found for this basename: PROBNP:3 in the last 8760 hours CBG:  Lab 05/06/12 1011  GLUCAP 93    Recent Results (from the past 240 hour(s))  MRSA PCR SCREENING     Status: Normal   Collection Time   05/11/12  3:34 PM      Component Value Range Status  Comment   MRSA by PCR NEGATIVE  NEGATIVE Final      Studies: No results found.  Scheduled Meds:    . [COMPLETED] aspirin  81 mg Oral Once  . atorvastatin  80 mg Oral q1800  . docusate sodium  100 mg Oral Daily  . pantoprazole  40 mg Oral Daily  . sodium chloride  500 mL Intravenous Once  . sodium chloride  3 mL Intravenous Q12H  . tiotropium  18 mcg Inhalation Daily  . [COMPLETED] vancomycin  1,000 mg Intravenous Q12H  . verapamil  240 mg Oral Daily  . [DISCONTINUED] aspirin EC  325 mg Oral Daily  . [DISCONTINUED] verapamil  240 mg Oral Daily   Continuous Infusions:    . DOPamine    . [DISCONTINUED] sodium chloride 100 mL/hr at 05/12/12 0500    Principal Problem:  *Stroke Active Problems:  TOBACCO ABUSE  FIBRILLATION, ATRIAL  CAROTID ARTERY DISEASE  PVD  Hypokalemia  COPD (chronic obstructive pulmonary disease)  Hemiballismus  TIA (transient ischemic attack)    Time spent: 45 minutes    Penny Pia  [facility] Pager [phone]. If 8PM-8AM, please contact night-coverage at [email or web address], password [facility] 05/12/2012, 5:10 PM  LOS: 10 days

## 2012-05-12 NOTE — Progress Notes (Signed)
Nutrition Brief Note  Patient identified on the Malnutrition Screening Tool (MST) Report for unsure of weight loss.  From review of EMR, patient has not lost weight this year.  Wt Readings from Last 10 Encounters:  05/09/12 126 lb 1.7 oz (57.2 kg)  05/09/12 126 lb 1.7 oz (57.2 kg)  05/09/12 126 lb 1.7 oz (57.2 kg)  04/23/12 117 lb (53.071 kg)  07/10/11 126 lb 12.2 oz (57.5 kg)  06/19/11 130 lb (58.968 kg)  01/02/11 133 lb (60.328 kg)  06/04/10 136 lb (61.689 kg)  05/28/10 138 lb (62.596 kg)  05/16/10 139 lb (63.05 kg)     Body mass index is 21.65 kg/(m^2). Pt meets criteria for normal weight based on current BMI.   Current diet order is Heart Healthy, patient is consuming approximately 75% of meals at this time. Labs and medications reviewed.   No nutrition interventions warranted at this time. If nutrition issues arise, please consult RD.   Joaquin Courts, RD, LDN, CNSC Pager# [phone] After Hours Pager# [phone]

## 2012-05-13 ENCOUNTER — Encounter (HOSPITAL_COMMUNITY): Payer: Self-pay | Admitting: Internal Medicine

## 2012-05-13 DIAGNOSIS — I6529 Occlusion and stenosis of unspecified carotid artery: Secondary | ICD-10-CM | POA: Diagnosis present

## 2012-05-13 DIAGNOSIS — I959 Hypotension, unspecified: Secondary | ICD-10-CM | POA: Diagnosis not present

## 2012-05-13 DIAGNOSIS — J449 Chronic obstructive pulmonary disease, unspecified: Secondary | ICD-10-CM | POA: Diagnosis not present

## 2012-05-13 DIAGNOSIS — I4891 Unspecified atrial fibrillation: Secondary | ICD-10-CM | POA: Diagnosis not present

## 2012-05-13 HISTORY — DX: Occlusion and stenosis of unspecified carotid artery: I65.29

## 2012-05-13 LAB — CBC
HCT: 38.2 % (ref 36.0–46.0)
MCV: 87.4 fL (ref 78.0–100.0)
Platelets: 120 10*3/uL — ABNORMAL LOW (ref 150–400)
RBC: 4.37 MIL/uL (ref 3.87–5.11)
WBC: 8.2 10*3/uL (ref 4.0–10.5)

## 2012-05-13 NOTE — Progress Notes (Signed)
Ambulated pt in halls 160ft, sats dropped to 88% on room air but questionable of accuracy of waveform. HR increased to 142 when stopped walking to rest in hall HR decreased to 135.  When returned to room, HR 102 and  sats 88-92% on room air when sitting on side of bed. Will continue to monitor. Caroline [name]

## 2012-05-13 NOTE — Progress Notes (Signed)
[facility] PROGRESS NOTE  Caroline [name] ZOX:[id number] DOB: [birth year] DOA: 05/02/2012 PCP: Cassell Smiles., MD  Assessment/Plan:  #1. Hypotension ?? Etiology. Resolved with IVF.  #2 A. fib Patient is tachycardic with heart rate ranging from the 90s to the 130s. During the hospitalization it was noted that patient's verapamil was not started and a such this wasn't resumed yesterday. Will monitor heart rate on verapamil if improved will DC home. If heart rate is not controlled on verapamil will add beta blocker for better rate control and consult with cardiology for further evaluation and management per patient request. Continue Pradaxa.  #3 symptomatic high-grade left internal carotid artery stenosis  status post left CEA 05/11/2012. Symptomatic improvement. Continue Pradaxa. Vascular surgery is following and appreciate input and recommendations.  #4 hypokalemia Resolved.  #5 COPD  stable.  Code Status: DNR Family Communication: Updated daughter at bedside. Disposition Plan: Home when medically stable   Consultants:  [facility]  Dr Darrick Penna 05/05/12  ENT Dr Pollyann Kennedy 05/11/12  Procedures: Arch aortogram with bilateral carotid angiogram, right iliac angiogram, selective left subclavian angiogram 05/06/12  L CEA 05/11/12 Dr Darrick Penna     Antibiotics:  None  HPI/Subjective: No complaints. Eager to go home  Objective: Filed Vitals:   05/13/12 0700 05/13/12 0739 05/13/12 0814 05/13/12 0910  BP:  117/58    Pulse:  91 109   Temp: 98.1 F (36.7 C) 98.1 F (36.7 C)    TempSrc: Oral Oral    Resp:  23 18   Height:      Weight:      SpO2:  90% 92% 95%    Intake/Output Summary (Last 24 hours) at 05/13/12 1003 Last data filed at 05/13/12 0800  Gross per 24 hour  Intake   1003 ml  Output   1000 ml  Net      3 ml   Filed Weights   05/08/12 0500 05/09/12 0600 05/13/12 0500  Weight: 54.885 kg (121 lb) 57.2 kg (126 lb 1.7 oz) 53.6 kg (118 lb 2.7 oz)     Exam:   General:  NAD  Cardiovascular: Irregularly irregular. No c/c/e  Respiratory: CTAB  Abdomen: Soft/NT/ND/+BS  Data Reviewed: Basic Metabolic Panel:  Lab 05/12/12 8119 05/11/12 0550  NA 137 139  K 4.1 4.4  CL 102 104  CO2 27 28  GLUCOSE 85 85  BUN 7 10  CREATININE 0.49* 0.58  CALCIUM 8.5 9.1  MG -- --  PHOS -- --   Liver Function Tests: No results found for this basename: AST:5,ALT:5,ALKPHOS:5,BILITOT:5,PROT:5,ALBUMIN:5 in the last 168 hours No results found for this basename: LIPASE:5,AMYLASE:5 in the last 168 hours No results found for this basename: AMMONIA:5 in the last 168 hours CBC:  Lab 05/13/12 0429 05/12/12 0359 05/11/12 0550 05/10/12 1305 05/08/12 0638  WBC 8.2 8.2 7.3 7.1 7.6  NEUTROABS -- -- -- -- --  HGB 12.9 13.2 15.1* 14.5 15.4*  HCT 38.2 38.9 43.7 42.7 45.3  MCV 87.4 86.6 86.7 86.6 86.5  PLT 120* 142* 150 153 136*   Cardiac Enzymes: No results found for this basename: CKTOTAL:5,CKMB:5,CKMBINDEX:5,TROPONINI:5 in the last 168 hours BNP (last 3 results) No results found for this basename: PROBNP:3 in the last 8760 hours CBG:  Lab 05/06/12 1011  GLUCAP 93    Recent Results (from the past 240 hour(s))  MRSA PCR SCREENING     Status: Normal   Collection Time   05/11/12  3:34 PM      Component Value Range Status Comment  MRSA by PCR NEGATIVE  NEGATIVE Final      Studies: No results found.  Scheduled Meds:   . atorvastatin  80 mg Oral q1800  . dabigatran  150 mg Oral Q12H  . docusate sodium  100 mg Oral Daily  . pantoprazole  40 mg Oral Daily  . [COMPLETED] sodium chloride  500 mL Intravenous Once  . sodium chloride  3 mL Intravenous Q12H  . tiotropium  18 mcg Inhalation Daily  . verapamil  240 mg Oral Daily   Continuous Infusions:   . DOPamine    . [DISCONTINUED] sodium chloride 100 mL/hr at 05/12/12 0500    Principal Problem:  *Internal carotid artery stenosis Active Problems:  TOBACCO ABUSE  FIBRILLATION, ATRIAL   CAROTID ARTERY DISEASE  PVD  Hypokalemia  Stroke  COPD (chronic obstructive pulmonary disease)  Hemiballismus  Hypotension    Time spent: > 35 mins    [facility]  [facility] Pager [phone]. If 8PM-8AM, please contact night-coverage at [email or web address], password [facility] 05/13/2012, 10:03 AM  LOS: 11 days

## 2012-05-13 NOTE — Discharge Summary (Signed)
Today was the first time I took care of this patient. Patient was previously seen by Dr. Cena Benton. Patient was initially supposed to be discharged on 05/12/2012 however due to hypotension and tachycardia patient is discharged was held and patient was placed in the step down unit.. For the rest of the hospitalization please see prior discharge summary dictated by Dr. Cena Benton on 05/12/2012.   Hypotension Patient was noted to be hypotensive yesterday just prior to discharge and was placed in the step down unit. Patient had a lower thiazide was discontinued and patient was given a bolus of IV fluids. Patient's blood pressure responded to the bolus of IV fluids and a systolic blood pressure remained greater than 100/24 hours. Patient's hypotension resolved and she was deemed to be in stable condition for discharge home.  Atrial fibrillation Prior to discharge patient was noted to be tachycardic with heart rates in the 130s. Patient's verapamil was not resumed during this hospitalization it was felt this was the likely etiology of her tachycardia. Patient's verapamil was resumed with better rate control. Patient's heart rate improved and heart rate was in the 90s prior to discharge. Patient will be discharged in stable condition. Patient's digoxin was also resumed.  For the rest of the hospitalization please see prior discharge summary dictated by Dr. Cena Benton on 05/12/2012.  Patient be discharged in stable and improved condition.

## 2012-05-13 NOTE — Progress Notes (Signed)
Physical Therapy Treatment Patient Details Name: Caroline [name] MRN: [id number] DOB: 10-12-1934 Today's Date: 05/13/2012 Time: 3235-5732 PT Time Calculation (min): 26 min  PT Assessment / Plan / Recommendation Comments on Treatment Session  Pt showing overall mobility improvement.  Unable to perform stairs due to increase HR with ambulation to 144.  Noticeable decrease in Sa02 to 83% on room air however unable to get a good waveform due to poor circulation.  Attempted to donn pulse ox on ear but would not stay.  Spoke with RN and RN plans to attempt getting Sa02 read during ambulation and placing pulse ox on forehead.  Pt asymptomatic throughout ambulation with no SOB noted.  Updated goals today and pt continues to show progress towards goals.    Follow Up Recommendations  Home health PT;Supervision for mobility/OOB     Does the patient have the potential to tolerate intense rehabilitation     Barriers to Discharge        Equipment Recommendations  None recommended by PT    Recommendations for Other Services    Frequency Min 4X/week   Plan Discharge plan remains appropriate;Frequency remains appropriate    Precautions / Restrictions Precautions Precautions: Fall Restrictions Weight Bearing Restrictions: No   Pertinent Vitals/Pain No c/o pain    Mobility  Bed Mobility Bed Mobility: Not assessed Transfers Transfers: Sit to Stand;Stand to Sit Sit to Stand: 4: Min guard;From chair/3-in-1 Stand to Sit: 4: Min guard;To chair/3-in-1 Details for Transfer Assistance: Minguard for safety with cues for hand placement Ambulation/Gait Ambulation/Gait Assistance: 4: Min guard;5: Supervision Ambulation Distance (Feet): 140 Feet Assistive device: Rolling walker Ambulation/Gait Assistance Details: Decrease ambulation due to increase heart rate to 144 during ambulation.  Pt asymptomatic and decrease to 120s at rest in standing.  PT having difficulty getting a correct read with Sa02 during  ambulation. Decreased to 83% therefore donn 2L O2 and no improvement.   Gait Pattern: Step-through pattern;Narrow base of support Gait velocity: increase gait speed General Gait Details: Overall improvement with gait     Exercises     PT Diagnosis:    PT Problem List:   PT Treatment Interventions:     PT Goals Acute Rehab PT Goals PT Goal Formulation: With patient Time For Goal Achievement: 05/20/12 Potential to Achieve Goals: Good Pt will go Sit to Stand: with modified independence PT Goal: Sit to Stand - Progress: Goal set today Pt will go Stand to Sit: with modified independence PT Goal: Stand to Sit - Progress: Goal set today Pt will Transfer Bed to Chair/Chair to Bed: with modified independence PT Transfer Goal: Bed to Chair/Chair to Bed - Progress: Goal set today Pt will Ambulate: >150 feet;with modified independence;with least restrictive assistive device PT Goal: Ambulate - Progress: Goal set today Pt will Go Up / Down Stairs: 3-5 stairs;with modified independence;with rail(s) PT Goal: Up/Down Stairs - Progress: Goal set today  Visit Information  Last PT Received On: 05/13/12 Assistance Needed: +1    Subjective Data  Subjective: "I'm ready to go home but my heart rate is still high but I haven't had my meds.  That will probably help." Patient Stated Goal: home   Cognition  Overall Cognitive Status: Appears within functional limits for tasks assessed/performed Arousal/Alertness: Awake/alert Orientation Level: Appears intact for tasks assessed Behavior During Session: [facility] for tasks performed    Balance     End of Session PT - End of Session Equipment Utilized During Treatment: Gait belt Activity Tolerance:  (Limited session due  to HR goal to perform stairs but unable) Patient left: in chair;with call bell/phone within reach Nurse Communication: Mobility status   GP     Danni Leabo 05/13/2012, 9:47 AM Jake Shark, PT DPT [phone]

## 2012-05-13 NOTE — Progress Notes (Signed)
[facility]  Patient Status: New  [facility] is providing the following services: PT  If patient discharges after hours, please call [phone].   Kizzie Furnish 05/13/2012, 12:38 PM

## 2012-05-13 NOTE — Progress Notes (Signed)
Pt walked 1 lap with a front wheel walker on room air. SpO2 dropped to 86%. Pt is now sitting in the chair SpO2 92% on room air. Will continue to monitor.  [place] Viha Kriegel, RN

## 2012-05-13 NOTE — Progress Notes (Addendum)
VASCULAR AND VEIN SURGERY POST - OP CEA PROGRESS NOTE  Date of Surgery: 05/02/2012 - 05/11/2012 Surgeon: Moishe Spice): Sherren Kerns, MD 2 Days Post-Op left Carotid Endarterectomy .  HPI: Caroline [name] is a 77 y.o. female who is 2 Days Post-Op left Carotid Endarterectomy . Patient is doing well. Pre-operative symptoms are Improved Patient denies headache; Patient denies difficulty swallowing; denies weakness in upper or lower extremities; Pt. denies other symptoms of stroke or TIA.  IMAGING: No results found.  Significant Diagnostic Studies: CBC Lab Results  Component Value Date   WBC 8.2 05/13/2012   HGB 12.9 05/13/2012   HCT 38.2 05/13/2012   MCV 87.4 05/13/2012   PLT 120* 05/13/2012    BMET    Component Value Date/Time   NA 137 05/12/2012 0359   K 4.1 05/12/2012 0359   CL 102 05/12/2012 0359   CO2 27 05/12/2012 0359   GLUCOSE 85 05/12/2012 0359   BUN 7 05/12/2012 0359   CREATININE 0.49* 05/12/2012 0359   CALCIUM 8.5 05/12/2012 0359   GFRNONAA >90 05/12/2012 0359   GFRAA >90 05/12/2012 0359    COAG Lab Results  Component Value Date   INR 0.97 05/06/2012   INR 1.30 02/23/2010   INR 2.4* 05/29/2008   No results found for this basename: PTT      Intake/Output Summary (Last 24 hours) at 05/13/12 0728 Last data filed at 05/13/12 0600  Gross per 24 hour  Intake   1223 ml  Output   1400 ml  Net   -177 ml    Physical Exam:  BP Readings from Last 3 Encounters:  05/13/12 139/71  05/13/12 139/71  05/13/12 139/71   Temp Readings from Last 3 Encounters:  05/13/12 99 F (37.2 C) Oral  05/13/12 99 F (37.2 C) Oral  05/13/12 99 F (37.2 C) Oral   SpO2 Readings from Last 3 Encounters:  05/13/12 100%  05/13/12 100%  05/13/12 100%   Pulse Readings from Last 3 Encounters:  05/13/12 86  05/13/12 86  05/13/12 86    Pt is A&O x 3 Gait is normal Speech is fluent left Neck Wound is healing well Patient with Negative tongue deviation and Negative facial droop  change from pre-op with known left droop. Pt has good and equal strength in all extremities  Assessment: Caroline [name] is a 77 y.o. female is S/P Left Carotid endarterectomy Pt is voiding, ambulating and taking po well   Plan: Discharge to: Home Follow-up in 2 weeks   Clinton Gallant [facility] [phone] 05/13/2012 7:28 AM   Pt with rapid afib yesterday and some mild hypotension.  This seems improved today No neck hematoma.  Neuro intact.  Hopefully d/c home later today  Fabienne Bruns, MD [facility] [place] Office: [phone] Pager: [phone]

## 2012-05-13 NOTE — Discharge Planning (Signed)
Pt d/c to home with daughter, both given d/c instructions and scritps, both verbalized understanding

## 2012-05-13 NOTE — Progress Notes (Signed)
MEDICARE-CERTIFIED HOME HEALTH AGENCIES [place]   Agencies that are Medicare-Certified and affiliated with [facility]  Home Health Agency  Telephone Number Address  [facility]  [facility] has ownership interest in this company; however, you are under no obligation to use this agency. [phone]  [address] [place], [place] [address]    Agencies that are Medicare-Certified and are not affiliated with [facility]   Home Health Agency Telephone Number Address  [facility] [phone] Fax [phone] [address] [place], [place]  [address]  [facility] [phone] [address] [place], [place] [address]  [facility]  [phone] Fax [phone] [address]  [place], [place]  [address]  [facility] [phone] or [phone] [address] [place], [place] [address]  [facility] [phone] or [phone] [address] [place], [place]  [address]      Agencies that are not Medicare-Certified and are not affiliated with [facility]    Home Health Agency Telephone Number Address  [facility] [phone] Fax [phone] [address] [place], [place]  [address]  [facility] [phone] or [phone] Fax [phone] [address] [place], [place]  [address]  [facility]  [phone] [address] [place], [place]  [facility] [phone] Fax [phone] [address] [place], [place]  [address]  [facility] [phone] Fax [phone] [address] [place], [place]  [address]  [facility] [phone] [address] [place], [place]  [address]  [facility] [phone] Fax [phone] [address] [place], [place] [address]  [facility] [phone] [address] [place],   [address]  [facility] [phone] Fax [phone] [address] [place],   [address]    

## 2012-05-14 ENCOUNTER — Telehealth: Payer: Self-pay | Admitting: Vascular Surgery

## 2012-05-14 DIAGNOSIS — I10 Essential (primary) hypertension: Secondary | ICD-10-CM | POA: Diagnosis not present

## 2012-05-14 DIAGNOSIS — Z8673 Personal history of transient ischemic attack (TIA), and cerebral infarction without residual deficits: Secondary | ICD-10-CM | POA: Diagnosis not present

## 2012-05-14 DIAGNOSIS — I739 Peripheral vascular disease, unspecified: Secondary | ICD-10-CM | POA: Diagnosis not present

## 2012-05-14 DIAGNOSIS — J449 Chronic obstructive pulmonary disease, unspecified: Secondary | ICD-10-CM | POA: Diagnosis not present

## 2012-05-14 DIAGNOSIS — M159 Polyosteoarthritis, unspecified: Secondary | ICD-10-CM | POA: Diagnosis not present

## 2012-05-14 DIAGNOSIS — IMO0001 Reserved for inherently not codable concepts without codable children: Secondary | ICD-10-CM | POA: Diagnosis not present

## 2012-05-14 NOTE — Telephone Encounter (Signed)
Message copied by Margaretmary Eddy on Thu May 14, 2012 12:35 PM ------      Message from: Melene Plan      Created: Tue May 12, 2012 11:03 AM                   ----- Message -----         From: Lars Mage, PA         Sent: 05/12/2012   7:48 AM           To: Melene Plan, RN            2 week f/U CEA -Caroline [name]

## 2012-05-18 ENCOUNTER — Telehealth: Payer: Self-pay | Admitting: Cardiology

## 2012-05-18 DIAGNOSIS — J449 Chronic obstructive pulmonary disease, unspecified: Secondary | ICD-10-CM | POA: Diagnosis not present

## 2012-05-18 DIAGNOSIS — I10 Essential (primary) hypertension: Secondary | ICD-10-CM | POA: Diagnosis not present

## 2012-05-18 DIAGNOSIS — M159 Polyosteoarthritis, unspecified: Secondary | ICD-10-CM | POA: Diagnosis not present

## 2012-05-18 DIAGNOSIS — Z8673 Personal history of transient ischemic attack (TIA), and cerebral infarction without residual deficits: Secondary | ICD-10-CM | POA: Diagnosis not present

## 2012-05-18 DIAGNOSIS — I739 Peripheral vascular disease, unspecified: Secondary | ICD-10-CM | POA: Diagnosis not present

## 2012-05-18 DIAGNOSIS — IMO0001 Reserved for inherently not codable concepts without codable children: Secondary | ICD-10-CM | POA: Diagnosis not present

## 2012-05-18 NOTE — Telephone Encounter (Signed)
Pt scheduled for a follow up in [place] office 06/10/2012  She she is doing OK.

## 2012-05-18 NOTE — Telephone Encounter (Signed)
Pt was to call office after discharge from hospital, pls call [phone]

## 2012-05-20 DIAGNOSIS — M159 Polyosteoarthritis, unspecified: Secondary | ICD-10-CM | POA: Diagnosis not present

## 2012-05-20 DIAGNOSIS — I10 Essential (primary) hypertension: Secondary | ICD-10-CM | POA: Diagnosis not present

## 2012-05-20 DIAGNOSIS — IMO0001 Reserved for inherently not codable concepts without codable children: Secondary | ICD-10-CM | POA: Diagnosis not present

## 2012-05-20 DIAGNOSIS — Z8673 Personal history of transient ischemic attack (TIA), and cerebral infarction without residual deficits: Secondary | ICD-10-CM | POA: Diagnosis not present

## 2012-05-20 DIAGNOSIS — J449 Chronic obstructive pulmonary disease, unspecified: Secondary | ICD-10-CM | POA: Diagnosis not present

## 2012-05-20 DIAGNOSIS — I739 Peripheral vascular disease, unspecified: Secondary | ICD-10-CM | POA: Diagnosis not present

## 2012-05-22 DIAGNOSIS — Z8673 Personal history of transient ischemic attack (TIA), and cerebral infarction without residual deficits: Secondary | ICD-10-CM | POA: Diagnosis not present

## 2012-05-22 DIAGNOSIS — I10 Essential (primary) hypertension: Secondary | ICD-10-CM | POA: Diagnosis not present

## 2012-05-22 DIAGNOSIS — M159 Polyosteoarthritis, unspecified: Secondary | ICD-10-CM | POA: Diagnosis not present

## 2012-05-22 DIAGNOSIS — IMO0001 Reserved for inherently not codable concepts without codable children: Secondary | ICD-10-CM | POA: Diagnosis not present

## 2012-05-22 DIAGNOSIS — I739 Peripheral vascular disease, unspecified: Secondary | ICD-10-CM | POA: Diagnosis not present

## 2012-05-22 DIAGNOSIS — J449 Chronic obstructive pulmonary disease, unspecified: Secondary | ICD-10-CM | POA: Diagnosis not present

## 2012-05-25 DIAGNOSIS — I10 Essential (primary) hypertension: Secondary | ICD-10-CM | POA: Diagnosis not present

## 2012-05-25 DIAGNOSIS — IMO0001 Reserved for inherently not codable concepts without codable children: Secondary | ICD-10-CM | POA: Diagnosis not present

## 2012-05-25 DIAGNOSIS — I739 Peripheral vascular disease, unspecified: Secondary | ICD-10-CM | POA: Diagnosis not present

## 2012-05-25 DIAGNOSIS — J449 Chronic obstructive pulmonary disease, unspecified: Secondary | ICD-10-CM | POA: Diagnosis not present

## 2012-05-25 DIAGNOSIS — Z8673 Personal history of transient ischemic attack (TIA), and cerebral infarction without residual deficits: Secondary | ICD-10-CM | POA: Diagnosis not present

## 2012-05-25 DIAGNOSIS — M159 Polyosteoarthritis, unspecified: Secondary | ICD-10-CM | POA: Diagnosis not present

## 2012-05-27 ENCOUNTER — Encounter: Payer: Self-pay | Admitting: Vascular Surgery

## 2012-05-27 DIAGNOSIS — Z8673 Personal history of transient ischemic attack (TIA), and cerebral infarction without residual deficits: Secondary | ICD-10-CM | POA: Diagnosis not present

## 2012-05-27 DIAGNOSIS — M159 Polyosteoarthritis, unspecified: Secondary | ICD-10-CM | POA: Diagnosis not present

## 2012-05-27 DIAGNOSIS — J449 Chronic obstructive pulmonary disease, unspecified: Secondary | ICD-10-CM | POA: Diagnosis not present

## 2012-05-27 DIAGNOSIS — IMO0001 Reserved for inherently not codable concepts without codable children: Secondary | ICD-10-CM | POA: Diagnosis not present

## 2012-05-27 DIAGNOSIS — I739 Peripheral vascular disease, unspecified: Secondary | ICD-10-CM | POA: Diagnosis not present

## 2012-05-27 DIAGNOSIS — I10 Essential (primary) hypertension: Secondary | ICD-10-CM | POA: Diagnosis not present

## 2012-05-28 ENCOUNTER — Encounter: Payer: Self-pay | Admitting: Vascular Surgery

## 2012-05-28 ENCOUNTER — Ambulatory Visit (INDEPENDENT_AMBULATORY_CARE_PROVIDER_SITE_OTHER): Payer: Medicare Other | Admitting: Vascular Surgery

## 2012-05-28 VITALS — BP 117/76 | HR 142 | Temp 98.6°F | Ht 64.0 in | Wt 119.0 lb

## 2012-05-28 DIAGNOSIS — I6529 Occlusion and stenosis of unspecified carotid artery: Secondary | ICD-10-CM

## 2012-05-28 DIAGNOSIS — Z48812 Encounter for surgical aftercare following surgery on the circulatory system: Secondary | ICD-10-CM

## 2012-05-28 NOTE — Progress Notes (Signed)
Patient is a 77 year old female who returns today for followup after left carotid endarterectomy. This was done for symptomatic left internal carotid artery stenosis. The patient denies any symptoms of TIA amaurosis or stroke. She denies any drainage from her incision. She is on pradaxa for afib.  Current Outpatient Prescriptions on File Prior to Visit  Medication Sig Dispense Refill  . cilostazol (PLETAL) 100 MG tablet Take 50 mg by mouth 2 (two) times daily.       . dabigatran (PRADAXA) 150 MG CAPS Take 1 capsule (150 mg total) by mouth every 12 (twelve) hours.  60 capsule  2  . verapamil (CALAN-SR) 240 MG CR tablet Take 240 mg by mouth daily. At lunch         Physical exam: Filed Vitals:   05/28/12 1545 05/28/12 1547  BP: 148/71 117/76  Pulse: 142   Temp: 98.6 F (37 C)   TempSrc: Oral   Height: 5\' 4"  (1.626 m)   Weight: 119 lb (53.978 kg)   SpO2: 95%    Neck: Left neck incision is well-healed  Neuro: Symmetric upper extremity and lower extremity motor strength which is 5 over 5  Assessment: Doing well status post left carotid endarterectomy  Plan: Continue current anticoagulation regimen. Followup carotid duplex scan in 6 months  Fabienne Bruns, MD [facility] [place] Office: [phone] Pager: [phone]

## 2012-05-29 NOTE — Addendum Note (Signed)
Addended by: Sharee Pimple on: 05/29/2012 09:40 AM   Modules accepted: Orders

## 2012-06-01 DIAGNOSIS — M159 Polyosteoarthritis, unspecified: Secondary | ICD-10-CM | POA: Diagnosis not present

## 2012-06-01 DIAGNOSIS — J449 Chronic obstructive pulmonary disease, unspecified: Secondary | ICD-10-CM | POA: Diagnosis not present

## 2012-06-01 DIAGNOSIS — F17200 Nicotine dependence, unspecified, uncomplicated: Secondary | ICD-10-CM | POA: Diagnosis not present

## 2012-06-01 DIAGNOSIS — Z8673 Personal history of transient ischemic attack (TIA), and cerebral infarction without residual deficits: Secondary | ICD-10-CM | POA: Diagnosis not present

## 2012-06-01 DIAGNOSIS — IMO0001 Reserved for inherently not codable concepts without codable children: Secondary | ICD-10-CM | POA: Diagnosis not present

## 2012-06-01 DIAGNOSIS — I10 Essential (primary) hypertension: Secondary | ICD-10-CM | POA: Diagnosis not present

## 2012-06-01 DIAGNOSIS — I739 Peripheral vascular disease, unspecified: Secondary | ICD-10-CM | POA: Diagnosis not present

## 2012-06-03 DIAGNOSIS — Z8673 Personal history of transient ischemic attack (TIA), and cerebral infarction without residual deficits: Secondary | ICD-10-CM | POA: Diagnosis not present

## 2012-06-03 DIAGNOSIS — I10 Essential (primary) hypertension: Secondary | ICD-10-CM | POA: Diagnosis not present

## 2012-06-03 DIAGNOSIS — J449 Chronic obstructive pulmonary disease, unspecified: Secondary | ICD-10-CM | POA: Diagnosis not present

## 2012-06-03 DIAGNOSIS — M159 Polyosteoarthritis, unspecified: Secondary | ICD-10-CM | POA: Diagnosis not present

## 2012-06-03 DIAGNOSIS — I739 Peripheral vascular disease, unspecified: Secondary | ICD-10-CM | POA: Diagnosis not present

## 2012-06-03 DIAGNOSIS — R6251 Failure to thrive (child): Secondary | ICD-10-CM | POA: Diagnosis not present

## 2012-06-03 DIAGNOSIS — E876 Hypokalemia: Secondary | ICD-10-CM | POA: Diagnosis not present

## 2012-06-03 DIAGNOSIS — IMO0001 Reserved for inherently not codable concepts without codable children: Secondary | ICD-10-CM | POA: Diagnosis not present

## 2012-06-03 DIAGNOSIS — IMO0002 Reserved for concepts with insufficient information to code with codable children: Secondary | ICD-10-CM | POA: Diagnosis not present

## 2012-06-08 DIAGNOSIS — I739 Peripheral vascular disease, unspecified: Secondary | ICD-10-CM | POA: Diagnosis not present

## 2012-06-08 DIAGNOSIS — M159 Polyosteoarthritis, unspecified: Secondary | ICD-10-CM | POA: Diagnosis not present

## 2012-06-08 DIAGNOSIS — IMO0001 Reserved for inherently not codable concepts without codable children: Secondary | ICD-10-CM | POA: Diagnosis not present

## 2012-06-08 DIAGNOSIS — Z8673 Personal history of transient ischemic attack (TIA), and cerebral infarction without residual deficits: Secondary | ICD-10-CM | POA: Diagnosis not present

## 2012-06-08 DIAGNOSIS — I10 Essential (primary) hypertension: Secondary | ICD-10-CM | POA: Diagnosis not present

## 2012-06-08 DIAGNOSIS — J449 Chronic obstructive pulmonary disease, unspecified: Secondary | ICD-10-CM | POA: Diagnosis not present

## 2012-06-10 ENCOUNTER — Encounter: Payer: Self-pay | Admitting: Cardiology

## 2012-06-10 ENCOUNTER — Ambulatory Visit (INDEPENDENT_AMBULATORY_CARE_PROVIDER_SITE_OTHER): Payer: Medicare Other | Admitting: Cardiology

## 2012-06-10 VITALS — BP 139/98 | HR 116 | Ht 64.0 in | Wt 121.0 lb

## 2012-06-10 DIAGNOSIS — I4891 Unspecified atrial fibrillation: Secondary | ICD-10-CM

## 2012-06-10 MED ORDER — POTASSIUM CHLORIDE CRYS ER 20 MEQ PO TBCR: 20.0000 meq | EXTENDED_RELEASE_TABLET | Freq: Two times a day (BID) | ORAL | Status: DC

## 2012-06-10 NOTE — Patient Instructions (Addendum)
Please start potassium chloride 20 meq twice a day Continue all other medications  Please have lab in one week  (BMP)  Follow up in 4 months with Dr Antoine Poche

## 2012-06-10 NOTE — Progress Notes (Signed)
HPI The patient presents for followup after a recent hospitalization for CVA. Thankfully she had no residual from this. She did have a left carotid endarterectomy following this. I reviewed the hospital records. Of note she had low blood pressures. Initially her verapamil was held but her heart rate went up and this was restarted. I note also that she had hypokalemia. Her HCTZ has been helped. Her last potassium in the system was 4.1 though she says was recently slightly low at her primary care doctor's office. I don't have these labs. She wants very much to be back on HCTZ as she's had some increasing lower extremity swelling. She otherwise says she's feeling well. She's not noticing palpitations, presyncope or syncope. She's had no chest pressure, neck or arm discomfort. She's had no shortness of breath, PND or orthopnea. She unfortunately still smokes cigarettes.   Allergies  Allergen Reactions  . Motrin (Ibuprofen)     Gi upset  . Albuterol     Confusion and shortness of breath  . Amoxicillin Hives  . Cephalexin Hives  . Codeine     confusion  . Entex Pac (Pseudoephedrine-Gg & Dm)   . Etanercept     vertigo  . Fluticasone-Salmeterol     Confusion and shortness of breath  . Gualenic Acid   . Lorazepam     hallucinations  . Nubain (Nalbuphine Hcl)   . Oxycodone Hcl Nausea And Vomiting  . Oxycodone-Acetaminophen Nausea And Vomiting    Current Outpatient Prescriptions  Medication Sig Dispense Refill  . cilostazol (PLETAL) 100 MG tablet 1/2 tab twice a day      . dabigatran (PRADAXA) 150 MG CAPS Take 1 capsule (150 mg total) by mouth every 12 (twelve) hours.  60 capsule  2  . verapamil (CALAN-SR) 240 MG CR tablet Take 240 mg by mouth daily. At lunch        Past Medical History  Diagnosis Date  . Hypertension   . GERD (gastroesophageal reflux disease)   . Cerebrovascular disease   . Peripheral vascular disease     40-59% left carotid stenosis, 0-39% right  . PAD (peripheral  artery disease)     lower extremity PAD with bilateral leg stenting  . TIA (transient ischemic attack)   . COPD (chronic obstructive pulmonary disease)   . CVA 10/25/2008    Qualifier: Diagnosis of  By: Cristela Felt, CNA, Christy    . Internal carotid artery stenosis 05/13/2012    HIGH GRADE LEFT INTERNAL CAROTID STENOSIS.  S/p  L CEA 05/11/12    Past Surgical History  Procedure Date  . Appendectomy   . Partial hysterectomy   . Gallbladder surgery   . Cholecystectomy   . Endarterectomy 05/11/2012    Procedure: ENDARTERECTOMY CAROTID;  Surgeon: Sherren Kerns, MD;  [facility];  Service: Vascular;  Laterality: Left;  . Patch angioplasty 05/11/2012    Procedure: PATCH ANGIOPLASTY;  Surgeon: Sherren Kerns, MD;  Location: [facility] OR;  Service: Vascular;  Laterality: Left;  with Dacron Patch Angioplasty  . Carotid endarterectomy     ROS:  Abdominal pain.  Otherwise as stated in the HPI and negative for all other systems.  PHYSICAL EXAM BP 139/98  Pulse 116  Ht 5\' 4"  (1.626 m)  Wt 121 lb (54.885 kg)  BMI 20.77 kg/m2 GENERAL:  Well appearing HEENT:  Pupils equal round and reactive, fundi not visualized, oral mucosa unremarkable, dentures NECK:  No jugular venous distention, waveform within normal limits, carotid upstroke brisk and  symmetric, healing left carotid endarterectomy scar, no thyromegaly LYMPHATICS:  No cervical, inguinal adenopathy LUNGS:  Clear to auscultation bilaterally, decreased BS CHEST:  Unremarkable HEART:  PMI not displaced or sustained,S1 and S2 within normal limits, no S3, no clicks, no rubs, no murmurs, irregular ABD:  Flat, positive bowel sounds normal in frequency in pitch, no bruits, no rebound, no guarding, no midline pulsatile mass, no hepatomegaly, no splenomegaly EXT:  2 plus pulses upper and diminished bilateral PT/DP, no edema, no cyanosis no clubbing NEURO:  Cranial nerves II through XII grossly intact, motor grossly intact throughout PSYCH:  Cognitively  intact, oriented to person place and time  EKG:  Atrial fibrillation, rate controlled, no acute ST T wave changes rate 113.  06/10/2012   ASSESSMENT AND PLAN  ATRIAL FIBRILLATION -  Her rate is elevated today but she has not yet taken her verapamil because she hasn't eaten. She tolerates anticoagulation. She will remain on the regimen as listed.  HYPERTENSION - Her blood pressure is elevated. She would like to start her hydrochlorothiazide. I do note that she's had low potassium. She's going to take 40 mEq daily. I will check a basic metabolic profile. I will start hydrochlorothiazide at 12.5 mg daily.  PVD -  We are managing this medically. I will continue to attempt risk reduction.  TOBACCO ABUSE -  She says she is trying to cut back.  She does not want other therapy.  CAROTID ARTERY DISEASE -  This will now be followed by Dr. Darrick Penna.

## 2012-06-11 ENCOUNTER — Telehealth: Payer: Self-pay | Admitting: Cardiology

## 2012-06-11 ENCOUNTER — Telehealth: Payer: Self-pay

## 2012-06-11 NOTE — Telephone Encounter (Signed)
Pt advised, she verbalized understanding. 

## 2012-06-11 NOTE — Telephone Encounter (Signed)
Pt. wants to know if she will have to take Potassium 20 meq. BID that Dr. Antoine Poche prescribed for her at yesterday's OV. Please advise.

## 2012-06-11 NOTE — Telephone Encounter (Signed)
Yes.  She can take the whole at once if she wants.

## 2012-06-11 NOTE — Telephone Encounter (Signed)
New Problem:    Patient called in wanting to let Dr. Antoine Poche know that her potassium level from last week was 3.5.  Patient just wanted to place this info on her record.  Please call back if you have any questions.

## 2012-06-12 ENCOUNTER — Telehealth: Payer: Self-pay | Admitting: Cardiology

## 2012-06-12 NOTE — Telephone Encounter (Signed)
Pt said she has an order for a carotid that expires in December, does she still need this? [facility] with a fu with dr Webb Silversmith same day, pls call

## 2012-06-12 NOTE — Telephone Encounter (Signed)
PER PT [facility] IS FOLLOWING [facility] AND SHE WILL HAVE THE TESTING DONE THERE IN MAY AS SCHEDULED.  ADVISED PT THAT WILL BE PERFECT

## 2012-06-17 ENCOUNTER — Other Ambulatory Visit: Payer: Self-pay | Admitting: Cardiology

## 2012-06-17 DIAGNOSIS — J449 Chronic obstructive pulmonary disease, unspecified: Secondary | ICD-10-CM | POA: Diagnosis not present

## 2012-06-17 DIAGNOSIS — I10 Essential (primary) hypertension: Secondary | ICD-10-CM | POA: Diagnosis not present

## 2012-06-17 DIAGNOSIS — I739 Peripheral vascular disease, unspecified: Secondary | ICD-10-CM | POA: Diagnosis not present

## 2012-06-17 DIAGNOSIS — Z8673 Personal history of transient ischemic attack (TIA), and cerebral infarction without residual deficits: Secondary | ICD-10-CM | POA: Diagnosis not present

## 2012-06-17 DIAGNOSIS — E876 Hypokalemia: Secondary | ICD-10-CM | POA: Diagnosis not present

## 2012-06-17 DIAGNOSIS — IMO0001 Reserved for inherently not codable concepts without codable children: Secondary | ICD-10-CM | POA: Diagnosis not present

## 2012-06-17 DIAGNOSIS — M159 Polyosteoarthritis, unspecified: Secondary | ICD-10-CM | POA: Diagnosis not present

## 2012-06-18 LAB — BASIC METABOLIC PANEL
BUN: 12 mg/dL (ref 6–23)
CO2: 35 mEq/L — ABNORMAL HIGH (ref 19–32)
Chloride: 99 mEq/L (ref 96–112)
Creat: 0.84 mg/dL (ref 0.50–1.10)
Potassium: 4.3 mEq/L (ref 3.5–5.3)

## 2012-06-24 ENCOUNTER — Telehealth: Payer: Self-pay | Admitting: Cardiology

## 2012-06-24 NOTE — Telephone Encounter (Signed)
Pt aware of result. She verbalized understanding.

## 2012-06-24 NOTE — Telephone Encounter (Signed)
plz return call to pt [phone] regarding lab results

## 2012-06-26 ENCOUNTER — Other Ambulatory Visit: Payer: Self-pay | Admitting: Cardiology

## 2012-06-29 ENCOUNTER — Other Ambulatory Visit: Payer: Self-pay

## 2012-06-29 MED ORDER — DABIGATRAN ETEXILATE MESYLATE 150 MG PO CAPS: 150.0000 mg | ORAL_CAPSULE | Freq: Two times a day (BID) | ORAL | Status: DC

## 2012-06-29 NOTE — Telephone Encounter (Signed)
New Probelm:    Patient called because her pharmacy claims that they did not receive the refill request for her dabigatran (PRADAXA) 150 MG CAPS that you sent today.  Please call back if you have any questions.

## 2012-06-29 NOTE — Telephone Encounter (Signed)
..   Requested Prescriptions   Signed Prescriptions Disp Refills  . dabigatran (PRADAXA) 150 MG CAPS 60 capsule 6    Sig: Take 1 capsule (150 mg total) by mouth every 12 (twelve) hours.    Authorizing Provider: HOCHREIN, JAMES    Ordering User: Sanyah Molnar M   

## 2012-07-14 ENCOUNTER — Other Ambulatory Visit: Payer: Self-pay

## 2012-07-14 MED ORDER — DABIGATRAN ETEXILATE MESYLATE 150 MG PO CAPS: 150.0000 mg | ORAL_CAPSULE | Freq: Two times a day (BID) | ORAL | Status: DC

## 2012-07-14 NOTE — Telephone Encounter (Signed)
..   Requested Prescriptions   Signed Prescriptions Disp Refills  . dabigatran (PRADAXA) 150 MG CAPS 60 capsule 6    Sig: Take 1 capsule (150 mg total) by mouth every 12 (twelve) hours.    Authorizing Provider: Rollene Rotunda    Ordering User: Christella Hartigan, Nyasha Rahilly Judie Petit

## 2012-09-23 ENCOUNTER — Ambulatory Visit: Payer: Medicare Other | Admitting: Cardiology

## 2012-10-05 DIAGNOSIS — M5137 Other intervertebral disc degeneration, lumbosacral region: Secondary | ICD-10-CM | POA: Diagnosis not present

## 2012-10-05 DIAGNOSIS — M545 Low back pain: Secondary | ICD-10-CM | POA: Diagnosis not present

## 2012-10-05 DIAGNOSIS — M999 Biomechanical lesion, unspecified: Secondary | ICD-10-CM | POA: Diagnosis not present

## 2012-10-07 DIAGNOSIS — M5137 Other intervertebral disc degeneration, lumbosacral region: Secondary | ICD-10-CM | POA: Diagnosis not present

## 2012-10-07 DIAGNOSIS — M999 Biomechanical lesion, unspecified: Secondary | ICD-10-CM | POA: Diagnosis not present

## 2012-10-07 DIAGNOSIS — M545 Low back pain: Secondary | ICD-10-CM | POA: Diagnosis not present

## 2012-10-09 DIAGNOSIS — M999 Biomechanical lesion, unspecified: Secondary | ICD-10-CM | POA: Diagnosis not present

## 2012-10-09 DIAGNOSIS — M5137 Other intervertebral disc degeneration, lumbosacral region: Secondary | ICD-10-CM | POA: Diagnosis not present

## 2012-10-09 DIAGNOSIS — M545 Low back pain: Secondary | ICD-10-CM | POA: Diagnosis not present

## 2012-10-12 DIAGNOSIS — M999 Biomechanical lesion, unspecified: Secondary | ICD-10-CM | POA: Diagnosis not present

## 2012-10-12 DIAGNOSIS — M5137 Other intervertebral disc degeneration, lumbosacral region: Secondary | ICD-10-CM | POA: Diagnosis not present

## 2012-10-12 DIAGNOSIS — M545 Low back pain: Secondary | ICD-10-CM | POA: Diagnosis not present

## 2012-10-15 DIAGNOSIS — M545 Low back pain: Secondary | ICD-10-CM | POA: Diagnosis not present

## 2012-10-15 DIAGNOSIS — M5137 Other intervertebral disc degeneration, lumbosacral region: Secondary | ICD-10-CM | POA: Diagnosis not present

## 2012-10-15 DIAGNOSIS — M999 Biomechanical lesion, unspecified: Secondary | ICD-10-CM | POA: Diagnosis not present

## 2012-10-16 DIAGNOSIS — M999 Biomechanical lesion, unspecified: Secondary | ICD-10-CM | POA: Diagnosis not present

## 2012-10-16 DIAGNOSIS — M5137 Other intervertebral disc degeneration, lumbosacral region: Secondary | ICD-10-CM | POA: Diagnosis not present

## 2012-10-16 DIAGNOSIS — M545 Low back pain: Secondary | ICD-10-CM | POA: Diagnosis not present

## 2012-10-19 DIAGNOSIS — M5137 Other intervertebral disc degeneration, lumbosacral region: Secondary | ICD-10-CM | POA: Diagnosis not present

## 2012-10-19 DIAGNOSIS — M545 Low back pain: Secondary | ICD-10-CM | POA: Diagnosis not present

## 2012-10-19 DIAGNOSIS — M999 Biomechanical lesion, unspecified: Secondary | ICD-10-CM | POA: Diagnosis not present

## 2012-10-22 DIAGNOSIS — M545 Low back pain: Secondary | ICD-10-CM | POA: Diagnosis not present

## 2012-10-22 DIAGNOSIS — M5137 Other intervertebral disc degeneration, lumbosacral region: Secondary | ICD-10-CM | POA: Diagnosis not present

## 2012-10-22 DIAGNOSIS — M999 Biomechanical lesion, unspecified: Secondary | ICD-10-CM | POA: Diagnosis not present

## 2012-10-26 DIAGNOSIS — M545 Low back pain: Secondary | ICD-10-CM | POA: Diagnosis not present

## 2012-10-26 DIAGNOSIS — M999 Biomechanical lesion, unspecified: Secondary | ICD-10-CM | POA: Diagnosis not present

## 2012-10-26 DIAGNOSIS — M5137 Other intervertebral disc degeneration, lumbosacral region: Secondary | ICD-10-CM | POA: Diagnosis not present

## 2012-10-30 DIAGNOSIS — M999 Biomechanical lesion, unspecified: Secondary | ICD-10-CM | POA: Diagnosis not present

## 2012-10-30 DIAGNOSIS — M545 Low back pain: Secondary | ICD-10-CM | POA: Diagnosis not present

## 2012-10-30 DIAGNOSIS — M5137 Other intervertebral disc degeneration, lumbosacral region: Secondary | ICD-10-CM | POA: Diagnosis not present

## 2012-11-04 ENCOUNTER — Ambulatory Visit (INDEPENDENT_AMBULATORY_CARE_PROVIDER_SITE_OTHER): Payer: Medicare Other | Admitting: Cardiology

## 2012-11-04 ENCOUNTER — Encounter: Payer: Self-pay | Admitting: Cardiology

## 2012-11-04 VITALS — BP 143/85 | HR 91 | Ht 64.0 in | Wt 114.0 lb

## 2012-11-04 DIAGNOSIS — I6529 Occlusion and stenosis of unspecified carotid artery: Secondary | ICD-10-CM

## 2012-11-04 DIAGNOSIS — I4891 Unspecified atrial fibrillation: Secondary | ICD-10-CM

## 2012-11-04 DIAGNOSIS — I739 Peripheral vascular disease, unspecified: Secondary | ICD-10-CM

## 2012-11-04 NOTE — Patient Instructions (Addendum)
The current medical regimen is effective;  continue present plan and medications.  Follow up in 6 months with Dr Hochrein.  You will receive a letter in the mail 2 months before you are due.  Please call us when you receive this letter to schedule your follow up appointment.  

## 2012-11-04 NOTE — Progress Notes (Signed)
HPI The patient presents for followup of atrial fibrillation and PVD.  She thinks that she is doing well from this standpoint.  She denies palpitations, presyncope or syncope. She thinks her breathing is good. She's had no residual from her stroke last year and has recovered from her endarterectomy. She has had some trouble tolerating potassium and his reduced to once daily dosing.   Allergies  Allergen Reactions  . Motrin (Ibuprofen)     Gi upset  . Albuterol     Confusion and shortness of breath  . Amoxicillin Hives  . Cephalexin Hives  . Codeine     confusion  . Entex Pac (Pseudoephedrine-Gg & Dm)   . Etanercept     vertigo  . Fluticasone-Salmeterol     Confusion and shortness of breath  . Gualenic Acid   . Lorazepam     hallucinations  . Nubain (Nalbuphine Hcl)   . Oxycodone Hcl Nausea And Vomiting  . Oxycodone-Acetaminophen Nausea And Vomiting    Current Outpatient Prescriptions  Medication Sig Dispense Refill  . cilostazol (PLETAL) 100 MG tablet 1/2 tab twice a day      . dabigatran (PRADAXA) 150 MG CAPS Take 1 capsule (150 mg total) by mouth every 12 (twelve) hours.  60 capsule  6  . hydrochlorothiazide (MICROZIDE) 12.5 MG capsule Take 12.5 mg by mouth daily.      . potassium chloride SA (K-DUR,KLOR-CON) 20 MEQ tablet Take 1 tablet (20 mEq total) by mouth 2 (two) times daily.  60 tablet  11  . verapamil (CALAN-SR) 240 MG CR tablet Take 240 mg by mouth daily. At lunch       No current facility-administered medications for this visit.    Past Medical History  Diagnosis Date  . Hypertension   . GERD (gastroesophageal reflux disease)   . Cerebrovascular disease   . Peripheral vascular disease     40-59% left carotid stenosis, 0-39% right  . PAD (peripheral artery disease)     lower extremity PAD with bilateral leg stenting  . TIA (transient ischemic attack)   . COPD (chronic obstructive pulmonary disease)   . CVA 10/25/2008    Qualifier: Diagnosis of  By: Cristela Felt,  CNA, Christy    . Internal carotid artery stenosis 05/13/2012    HIGH GRADE LEFT INTERNAL CAROTID STENOSIS.  S/p  L CEA 05/11/12    Past Surgical History  Procedure Laterality Date  . Appendectomy    . Partial hysterectomy    . Gallbladder surgery    . Cholecystectomy    . Endarterectomy  05/11/2012    Procedure: ENDARTERECTOMY CAROTID;  Surgeon: Sherren Kerns, MD;  Location: [facility] OR;  Service: Vascular;  Laterality: Left;  . Patch angioplasty  05/11/2012    Procedure: PATCH ANGIOPLASTY;  Surgeon: Sherren Kerns, MD;  Location: [facility] OR;  Service: Vascular;  Laterality: Left;  with Dacron Patch Angioplasty  . Carotid endarterectomy      ROS:  Abdominal pain.  Otherwise as stated in the HPI and negative for all other systems.  PHYSICAL EXAM BP 143/85  Pulse 91  Ht 5\' 4"  (1.626 m)  Wt 114 lb (51.71 kg)  BMI 19.56 kg/m2 GENERAL:  Well appearing HEENT:  Pupils equal round and reactive, fundi not visualized, oral mucosa unremarkable, dentures NECK:  No jugular venous distention, waveform within normal limits, carotid upstroke brisk and symmetric, healing left carotid endarterectomy scar, no thyromegaly LYMPHATICS:  No cervical, inguinal adenopathy LUNGS:  Clear to auscultation bilaterally, decreased BS  CHEST:  Unremarkable HEART:  PMI not displaced or sustained,S1 and S2 within normal limits, no S3, no clicks, no rubs, no murmurs, irregular ABD:  Flat, positive bowel sounds normal in frequency in pitch, no bruits, no rebound, no guarding, no midline pulsatile mass, no hepatomegaly, no splenomegaly EXT:  2 plus pulses upper and diminished bilateral PT/DP, no edema, no cyanosis no clubbing   EKG:  Atrial fibrillation, rate controlled, no acute ST T wave changes rate 91.  11/04/2012   ASSESSMENT AND PLAN  ATRIAL FIBRILLATION -  The patient  tolerates this rhythm and rate control and anticoagulation. We will continue with the meds as listed.  HYPERTENSION - Her blood pressures have  been at target.  No change in therapy is indicated.  PVD -  We will continue with risk reduction.    TOBACCO ABUSE -  She continues to smoke but has cut back.   CAROTID ARTERY DISEASE -  This will now be followed by Dr. Darrick Penna.

## 2012-11-06 DIAGNOSIS — M999 Biomechanical lesion, unspecified: Secondary | ICD-10-CM | POA: Diagnosis not present

## 2012-11-06 DIAGNOSIS — M5137 Other intervertebral disc degeneration, lumbosacral region: Secondary | ICD-10-CM | POA: Diagnosis not present

## 2012-11-06 DIAGNOSIS — M545 Low back pain: Secondary | ICD-10-CM | POA: Diagnosis not present

## 2012-11-26 ENCOUNTER — Other Ambulatory Visit: Payer: Medicare Other

## 2012-11-26 ENCOUNTER — Ambulatory Visit: Payer: Medicare Other | Admitting: Neurosurgery

## 2012-12-04 DIAGNOSIS — M5137 Other intervertebral disc degeneration, lumbosacral region: Secondary | ICD-10-CM | POA: Diagnosis not present

## 2012-12-04 DIAGNOSIS — M545 Low back pain: Secondary | ICD-10-CM | POA: Diagnosis not present

## 2012-12-04 DIAGNOSIS — M999 Biomechanical lesion, unspecified: Secondary | ICD-10-CM | POA: Diagnosis not present

## 2012-12-17 ENCOUNTER — Ambulatory Visit: Payer: Medicare Other | Admitting: Vascular Surgery

## 2012-12-17 ENCOUNTER — Other Ambulatory Visit: Payer: Self-pay | Admitting: *Deleted

## 2012-12-17 ENCOUNTER — Other Ambulatory Visit (INDEPENDENT_AMBULATORY_CARE_PROVIDER_SITE_OTHER): Payer: Medicare Other | Admitting: *Deleted

## 2012-12-17 DIAGNOSIS — I6529 Occlusion and stenosis of unspecified carotid artery: Secondary | ICD-10-CM | POA: Diagnosis not present

## 2012-12-17 DIAGNOSIS — Z48812 Encounter for surgical aftercare following surgery on the circulatory system: Secondary | ICD-10-CM

## 2012-12-18 ENCOUNTER — Encounter: Payer: Self-pay | Admitting: Vascular Surgery

## 2012-12-22 ENCOUNTER — Encounter (INDEPENDENT_AMBULATORY_CARE_PROVIDER_SITE_OTHER): Payer: Medicare Other

## 2012-12-22 DIAGNOSIS — I714 Abdominal aortic aneurysm, without rupture: Secondary | ICD-10-CM | POA: Diagnosis not present

## 2012-12-26 DIAGNOSIS — I10 Essential (primary) hypertension: Secondary | ICD-10-CM | POA: Diagnosis not present

## 2012-12-26 DIAGNOSIS — L0390 Cellulitis, unspecified: Secondary | ICD-10-CM | POA: Diagnosis not present

## 2012-12-26 DIAGNOSIS — IMO0002 Reserved for concepts with insufficient information to code with codable children: Secondary | ICD-10-CM | POA: Diagnosis not present

## 2012-12-26 DIAGNOSIS — J449 Chronic obstructive pulmonary disease, unspecified: Secondary | ICD-10-CM | POA: Diagnosis not present

## 2013-04-13 DIAGNOSIS — IMO0002 Reserved for concepts with insufficient information to code with codable children: Secondary | ICD-10-CM | POA: Diagnosis not present

## 2013-04-13 DIAGNOSIS — J441 Chronic obstructive pulmonary disease with (acute) exacerbation: Secondary | ICD-10-CM | POA: Diagnosis not present

## 2013-04-16 DIAGNOSIS — J441 Chronic obstructive pulmonary disease with (acute) exacerbation: Secondary | ICD-10-CM | POA: Diagnosis not present

## 2013-04-16 DIAGNOSIS — R109 Unspecified abdominal pain: Secondary | ICD-10-CM | POA: Diagnosis not present

## 2013-04-16 DIAGNOSIS — IMO0002 Reserved for concepts with insufficient information to code with codable children: Secondary | ICD-10-CM | POA: Diagnosis not present

## 2013-05-21 ENCOUNTER — Other Ambulatory Visit: Payer: Self-pay | Admitting: Vascular Surgery

## 2013-05-21 DIAGNOSIS — Z48812 Encounter for surgical aftercare following surgery on the circulatory system: Secondary | ICD-10-CM

## 2013-06-09 DIAGNOSIS — IMO0002 Reserved for concepts with insufficient information to code with codable children: Secondary | ICD-10-CM | POA: Diagnosis not present

## 2013-06-09 DIAGNOSIS — K219 Gastro-esophageal reflux disease without esophagitis: Secondary | ICD-10-CM | POA: Diagnosis not present

## 2013-06-09 DIAGNOSIS — J449 Chronic obstructive pulmonary disease, unspecified: Secondary | ICD-10-CM | POA: Diagnosis not present

## 2013-06-09 DIAGNOSIS — J441 Chronic obstructive pulmonary disease with (acute) exacerbation: Secondary | ICD-10-CM | POA: Diagnosis not present

## 2013-06-09 DIAGNOSIS — I10 Essential (primary) hypertension: Secondary | ICD-10-CM | POA: Diagnosis not present

## 2013-06-09 DIAGNOSIS — Z23 Encounter for immunization: Secondary | ICD-10-CM | POA: Diagnosis not present

## 2013-06-23 ENCOUNTER — Encounter: Payer: Self-pay | Admitting: Family

## 2013-06-24 ENCOUNTER — Ambulatory Visit: Payer: Medicare Other | Admitting: Vascular Surgery

## 2013-06-24 ENCOUNTER — Ambulatory Visit (INDEPENDENT_AMBULATORY_CARE_PROVIDER_SITE_OTHER): Payer: Medicare Other | Admitting: Family

## 2013-06-24 ENCOUNTER — Encounter: Payer: Self-pay | Admitting: Family

## 2013-06-24 ENCOUNTER — Ambulatory Visit (HOSPITAL_COMMUNITY)
Admission: RE | Admit: 2013-06-24 | Discharge: 2013-06-24 | Disposition: A | Discharge status: Home / Self Care | Payer: Medicare Other | Source: Ambulatory Visit | Attending: Family | Admitting: Family

## 2013-06-24 ENCOUNTER — Other Ambulatory Visit (HOSPITAL_COMMUNITY): Payer: Medicare Other

## 2013-06-24 ENCOUNTER — Other Ambulatory Visit: Payer: Medicare Other

## 2013-06-24 DIAGNOSIS — Z48812 Encounter for surgical aftercare following surgery on the circulatory system: Secondary | ICD-10-CM

## 2013-06-24 DIAGNOSIS — I6529 Occlusion and stenosis of unspecified carotid artery: Secondary | ICD-10-CM

## 2013-06-24 NOTE — Patient Instructions (Addendum)
Stroke Prevention Some medical conditions and behaviors are associated with an increased chance of having a stroke. You may prevent a stroke by making healthy choices and managing medical conditions. Reduce your risk of having a stroke by:  Staying physically active. Get at least 30 minutes of activity on most or all days.  Not smoking. It may also be helpful to avoid exposure to secondhand smoke.  Limiting alcohol use. Moderate alcohol use is considered to be:  No more than 2 drinks per day for men.  No more than 1 drink per day for nonpregnant women.  Eating healthy foods.  Include 5 or more servings of fruits and vegetables a day.  Certain diets may be prescribed to address high blood pressure, high cholesterol, diabetes, or obesity.  Managing your cholesterol levels.  A low-saturated fat, low-trans fat, low-cholesterol, and high-fiber diet may control cholesterol levels.  Take any prescribed medicines to control cholesterol as directed by your caregiver.  Managing your diabetes.  A controlled-carbohydrate, controlled-sugar diet is recommended to manage diabetes.  Take any prescribed medicines to control diabetes as directed by your caregiver.  Controlling your high blood pressure (hypertension).  A low-salt (sodium), low-saturated fat, low-trans fat, and low-cholesterol diet is recommended to manage high blood pressure.  Take any prescribed medicines to control hypertension as directed by your caregiver.  Maintaining a healthy weight.  A reduced-calorie, low-sodium, low-saturated fat, low-trans fat, low-cholesterol diet is recommended to manage weight.  Stopping drug abuse.  Avoiding birth control pills.  Talk to your caregiver about the risks of taking birth control pills if you are over 35 years old, smoke, get migraines, or have ever had a blood clot.  Getting evaluated for sleep disorders (sleep apnea).  Talk to your caregiver about getting a sleep evaluation  if you snore a lot or have excessive sleepiness.  Taking medicines as directed by your caregiver.  For some people, aspirin or blood thinners (anticoagulants) are helpful in reducing the risk of forming abnormal blood clots that can lead to stroke. If you have the irregular heart rhythm of atrial fibrillation, you should be on a blood thinner unless there is a good reason you cannot take them.  Understand all your medicine instructions. SEEK IMMEDIATE MEDICAL CARE IF:   You have sudden weakness or numbness of the face, arm, or leg, especially on one side of the body.  You have sudden confusion.  You have trouble speaking (aphasia) or understanding.  You have sudden trouble seeing in one or both eyes.  You have sudden trouble walking.  You have dizziness.  You have a loss of balance or coordination.  You have a sudden, severe headache with no known cause.  You have new chest pain or an irregular heartbeat. Any of these symptoms may represent a serious problem that is an emergency. Do not wait to see if the symptoms will go away. Get medical help right away. Call your local emergency services (911 in [place] Do not drive yourself to the hospital. Document Released: 08/01/2004 Document Revised: 09/16/2011 Document Reviewed: 12/25/2012 ExitCare Patient Information 2014 [employer].  Smoking Cessation Quitting smoking is important to your health and has many advantages. However, it is not always easy to quit since nicotine is a very addictive drug. Often times, people try 3 times or more before being able to quit. This document explains the best ways for you to prepare to quit smoking. Quitting takes hard work and a lot of effort, but you can do it.   ADVANTAGES OF QUITTING SMOKING  You will live longer, feel better, and live better.  Your body will feel the impact of quitting smoking almost immediately.  Within 20 minutes, blood pressure decreases. Your pulse returns to its normal  level.  After 8 hours, carbon monoxide levels in the blood return to normal. Your oxygen level increases.  After 24 hours, the chance of having a heart attack starts to decrease. Your breath, hair, and body stop smelling like smoke.  After 48 hours, damaged nerve endings begin to recover. Your sense of taste and smell improve.  After 72 hours, the body is virtually free of nicotine. Your bronchial tubes relax and breathing becomes easier.  After 2 to 12 weeks, lungs can hold more air. Exercise becomes easier and circulation improves.  The risk of having a heart attack, stroke, cancer, or lung disease is greatly reduced.  After 1 year, the risk of coronary heart disease is cut in half.  After 5 years, the risk of stroke falls to the same as a nonsmoker.  After 10 years, the risk of lung cancer is cut in half and the risk of other cancers decreases significantly.  After 15 years, the risk of coronary heart disease drops, usually to the level of a nonsmoker.  If you are pregnant, quitting smoking will improve your chances of having a healthy baby.  The people you live with, especially any children, will be healthier.  You will have extra money to spend on things other than cigarettes. QUESTIONS TO THINK ABOUT BEFORE ATTEMPTING TO QUIT You may want to talk about your answers with your caregiver.  Why do you want to quit?  If you tried to quit in the past, what helped and what did not?  What will be the most difficult situations for you after you quit? How will you plan to handle them?  Who can help you through the tough times? Your family? Friends? A caregiver?  What pleasures do you get from smoking? What ways can you still get pleasure if you quit? Here are some questions to ask your caregiver:  How can you help me to be successful at quitting?  What medicine do you think would be best for me and how should I take it?  What should I do if I need more help?  What is  smoking withdrawal like? How can I get information on withdrawal? GET READY  Set a quit date.  Change your environment by getting rid of all cigarettes, ashtrays, matches, and lighters in your home, car, or work. Do not let people smoke in your home.  Review your past attempts to quit. Think about what worked and what did not. GET SUPPORT AND ENCOURAGEMENT You have a better chance of being successful if you have help. You can get support in many ways.  Tell your family, friends, and co-workers that you are going to quit and need their support. Ask them not to smoke around you.  Get individual, group, or telephone counseling and support. Programs are available at local hospitals and health centers. Call your local health department for information about programs in your area.  Spiritual beliefs and practices may help some smokers quit.  Download a "quit meter" on your computer to keep track of quit statistics, such as how long you have gone without smoking, cigarettes not smoked, and money saved.  Get a self-help book about quitting smoking and staying off of tobacco. LEARN NEW SKILLS AND BEHAVIORS  Distract yourself from   urges to smoke. Talk to someone, go for a walk, or occupy your time with a task.  Change your normal routine. Take a different route to work. Drink tea instead of coffee. Eat breakfast in a different place.  Reduce your stress. Take a hot bath, exercise, or read a book.  Plan something enjoyable to do every day. Reward yourself for not smoking.  Explore interactive web-based programs that specialize in helping you quit. GET MEDICINE AND USE IT CORRECTLY Medicines can help you stop smoking and decrease the urge to smoke. Combining medicine with the above behavioral methods and support can greatly increase your chances of successfully quitting smoking.  Nicotine replacement therapy helps deliver nicotine to your body without the negative effects and risks of smoking.  Nicotine replacement therapy includes nicotine gum, lozenges, inhalers, nasal sprays, and skin patches. Some may be available over-the-counter and others require a prescription.  Antidepressant medicine helps people abstain from smoking, but how this works is unknown. This medicine is available by prescription.  Nicotinic receptor partial agonist medicine simulates the effect of nicotine in your brain. This medicine is available by prescription. Ask your caregiver for advice about which medicines to use and how to use them based on your health history. Your caregiver will tell you what side effects to look out for if you choose to be on a medicine or therapy. Carefully read the information on the package. Do not use any other product containing nicotine while using a nicotine replacement product.  RELAPSE OR DIFFICULT SITUATIONS Most relapses occur within the first 3 months after quitting. Do not be discouraged if you start smoking again. Remember, most people try several times before finally quitting. You may have symptoms of withdrawal because your body is used to nicotine. You may crave cigarettes, be irritable, feel very hungry, cough often, get headaches, or have difficulty concentrating. The withdrawal symptoms are only temporary. They are strongest when you first quit, but they will go away within 10 14 days. To reduce the chances of relapse, try to:  Avoid drinking alcohol. Drinking lowers your chances of successfully quitting.  Reduce the amount of caffeine you consume. Once you quit smoking, the amount of caffeine in your body increases and can give you symptoms, such as a rapid heartbeat, sweating, and anxiety.  Avoid smokers because they can make you want to smoke.  Do not let weight gain distract you. Many smokers will gain weight when they quit, usually less than 10 pounds. Eat a healthy diet and stay active. You can always lose the weight gained after you quit.  Find ways to improve  your mood other than smoking. FOR MORE INFORMATION  www.smokefree.gov  Document Released: 06/18/2001 Document Revised: 12/24/2011 Document Reviewed: 10/03/2011 ExitCare Patient Information 2014 [employer].  

## 2013-06-24 NOTE — Progress Notes (Signed)
Established Carotid Patient  History of Present Illness  Caroline [name] is a 78 y.o. female patient of Dr. Darrick Penna who is status post left carotid endarterectomy in November, 2013. This was done for symptomatic left internal carotid artery stenosis.  She returns today for carotid artery surveillance and follow up.  Patient has Positive history of TIA or stroke symptom, last was a year ago, just before the CEA, no further stroke or TIA symptoms since then. She has some mild loss of right hand dexterity from her first stroke. She did have some unilateral facial drooping with a stroke but this quickly resolved.    The patient denies amaurosis fugax or monocular blindness.    Pt. denies hemiplegia.  The patient denies receptive or expressive aphasia.  Pt. denies extremity weakness. She denies claudication symptoms, denies non-healing wounds.  Her husband has severe health issues which is a stressor for her.   Patient reports New Medical or Surgical History: newly recovering from pneumonia.  Pt Diabetic: No Pt smoker: smoker  (3/4 ppd x 60 yrs)  Pt meds include: Statin : No ASA: No Other anticoagulants/antiplatelets: Pradaxa prescribed by her cardiologist.   Past Medical History  Diagnosis Date  . Hypertension   . GERD (gastroesophageal reflux disease)   . Cerebrovascular disease   . PAD (peripheral artery disease)     lower extremity PAD with bilateral leg stenting  . TIA (transient ischemic attack)   . COPD (chronic obstructive pulmonary disease)   . CVA 10/25/2008    Qualifier: Diagnosis of  By: Cristela Felt, CNA, Christy    . Internal carotid artery stenosis 05/13/2012    HIGH GRADE LEFT INTERNAL CAROTID STENOSIS.  S/p  L CEA 05/11/12  . Irregular heart beat   . DVT (deep venous thrombosis)     Social History History  Substance Use Topics  . Smoking status: Current Every Day Smoker -- 1.00 packs/day  . Smokeless tobacco: Current User  . Alcohol Use: No    Family History Family  History  Problem Relation Age of Onset  . Deep vein thrombosis Sister     Blood Clot in leg    Surgical History Past Surgical History  Procedure Laterality Date  . Appendectomy    . Partial hysterectomy    . Gallbladder surgery    . Cholecystectomy    . Endarterectomy  05/11/2012    Procedure: ENDARTERECTOMY CAROTID;  Surgeon: Sherren Kerns, MD;  Location: [facility] OR;  Service: Vascular;  Laterality: Left;  . Patch angioplasty  05/11/2012    Procedure: PATCH ANGIOPLASTY;  Surgeon: Sherren Kerns, MD;  Location: [facility] OR;  Service: Vascular;  Laterality: Left;  with Dacron Patch Angioplasty  . Carotid endarterectomy      Allergies  Allergen Reactions  . Motrin [Ibuprofen]     Gi upset  . Albuterol     Confusion and shortness of breath  . Amoxicillin Hives  . Cephalexin Hives  . Codeine     confusion  . Entex Pac [Pseudoephedrine-Gg & Dm]   . Etanercept     vertigo  . Fluticasone-Salmeterol     Confusion and shortness of breath  . Gualenic Acid   . Lorazepam     hallucinations  . Nubain [Nalbuphine Hcl]   . Oxycodone Hcl Nausea And Vomiting  . Oxycodone-Acetaminophen Nausea And Vomiting    Current Outpatient Prescriptions  Medication Sig Dispense Refill  . cilostazol (PLETAL) 100 MG tablet 1/2 tab twice a day      .  dabigatran (PRADAXA) 150 MG CAPS Take 1 capsule (150 mg total) by mouth every 12 (twelve) hours.  60 capsule  6  . hydrochlorothiazide (MICROZIDE) 12.5 MG capsule Take 12.5 mg by mouth daily.      . potassium chloride SA (K-DUR,KLOR-CON) 20 MEQ tablet Take 1 tablet (20 mEq total) by mouth 2 (two) times daily.  60 tablet  11  . verapamil (CALAN-SR) 240 MG CR tablet Take 240 mg by mouth daily. At lunch       No current facility-administered medications for this visit.    Review of Systems : See HPI for pertinent positives and negatives.  Physical Examination  Filed Vitals:   06/24/13 1328  BP: 133/85  Pulse: 96  Resp: 16   Filed Weights   06/24/13  1328  Weight: 110 lb (49.896 kg)   Body mass index is 18.31 kg/(m^2).   General: WDWN female in NAD, thin, strong smell of cigarette smoke. GAIT: normal Eyes: PERRLA Pulmonary: Minimal air movement in right posterior fields, transient inspiratory wheezes in left posterior fields, no rales or rhonchi auscultated, moist cough.  Cardiac: irregular Rhythm ,  Negative detected Murmurs.  VASCULAR EXAM Carotid Bruits Left Right   Negative Negative    Aorta is not palpable. Radial pulses are 2+ palpable and equal.                                                                                                                            LE Pulses LEFT RIGHT       POPLITEAL   palpable    palpable       POSTERIOR TIBIAL  not palpable   not palpable        DORSALIS PEDIS      ANTERIOR TIBIAL  palpable   palpable     Gastrointestinal: soft, nontender, BS WNL, no r/g,  negative masses.  Musculoskeletal: Negative muscle atrophy/wasting. M/S 4/5 throughout, Extremities without ischemic changes.  Neurologic: A&O X 3; Appropriate Affect ; SENSATION ;normal;  Speech is normal CN 2-12 intact, Pain and light touch intact in extremities, Motor exam as listed above.   Non-Invasive Vascular Imaging CAROTID DUPLEX 06/24/2013   Right ICA: <40% stenosis. Left ICA: patent CEA site with no evidence of restenosis.  These findings are Unchanged from previous exam of 13 months ago.  Assessment: Caroline [name] is a 78 y.o. female who presents with asymptomatic patent right ICA which is the CEA site and minimal plaque in left ICA. The  ICA stenosis is  Unchanged from previous exam. Unfortunately she continues to smoke which is her only risk factor for atherosclerosis that is present for her.  Plan: Follow-up in 1 year with Carotid Duplex scan.   I discussed in depth with the patient the nature of atherosclerosis, and emphasized the importance of maximal medical management including strict  control of blood pressure, blood glucose, and lipid levels, obtaining regular exercise, and cessation of smoking.  The patient  is aware that without maximal medical management the underlying atherosclerotic disease process will progress, limiting the benefit of any interventions. The patient was given information about stroke prevention and what symptoms should prompt the patient to seek immediate medical care. Patient was counseled re smoking cessation. Thank you for allowing [place] to participate in this patient's care.  Charisse March, RN, MSN, FNP-C [facility] Office: [phone]  Clinic Physician: Darrick Penna 06/24/2013 1:40 PM

## 2013-10-01 DIAGNOSIS — M999 Biomechanical lesion, unspecified: Secondary | ICD-10-CM | POA: Diagnosis not present

## 2013-10-01 DIAGNOSIS — M5430 Sciatica, unspecified side: Secondary | ICD-10-CM | POA: Diagnosis not present

## 2013-10-01 DIAGNOSIS — M5137 Other intervertebral disc degeneration, lumbosacral region: Secondary | ICD-10-CM | POA: Diagnosis not present

## 2013-10-04 DIAGNOSIS — M999 Biomechanical lesion, unspecified: Secondary | ICD-10-CM | POA: Diagnosis not present

## 2013-10-04 DIAGNOSIS — M5137 Other intervertebral disc degeneration, lumbosacral region: Secondary | ICD-10-CM | POA: Diagnosis not present

## 2013-10-04 DIAGNOSIS — M545 Low back pain, unspecified: Secondary | ICD-10-CM | POA: Diagnosis not present

## 2013-10-07 DIAGNOSIS — M999 Biomechanical lesion, unspecified: Secondary | ICD-10-CM | POA: Diagnosis not present

## 2013-10-07 DIAGNOSIS — M545 Low back pain, unspecified: Secondary | ICD-10-CM | POA: Diagnosis not present

## 2013-10-07 DIAGNOSIS — M5137 Other intervertebral disc degeneration, lumbosacral region: Secondary | ICD-10-CM | POA: Diagnosis not present

## 2013-10-12 DIAGNOSIS — M999 Biomechanical lesion, unspecified: Secondary | ICD-10-CM | POA: Diagnosis not present

## 2013-10-12 DIAGNOSIS — M545 Low back pain, unspecified: Secondary | ICD-10-CM | POA: Diagnosis not present

## 2013-10-12 DIAGNOSIS — M5137 Other intervertebral disc degeneration, lumbosacral region: Secondary | ICD-10-CM | POA: Diagnosis not present

## 2013-10-14 DIAGNOSIS — M545 Low back pain, unspecified: Secondary | ICD-10-CM | POA: Diagnosis not present

## 2013-10-14 DIAGNOSIS — M5137 Other intervertebral disc degeneration, lumbosacral region: Secondary | ICD-10-CM | POA: Diagnosis not present

## 2013-10-14 DIAGNOSIS — M999 Biomechanical lesion, unspecified: Secondary | ICD-10-CM | POA: Diagnosis not present

## 2013-10-21 DIAGNOSIS — M545 Low back pain, unspecified: Secondary | ICD-10-CM | POA: Diagnosis not present

## 2013-10-21 DIAGNOSIS — M999 Biomechanical lesion, unspecified: Secondary | ICD-10-CM | POA: Diagnosis not present

## 2013-10-21 DIAGNOSIS — M5137 Other intervertebral disc degeneration, lumbosacral region: Secondary | ICD-10-CM | POA: Diagnosis not present

## 2013-11-01 DIAGNOSIS — I10 Essential (primary) hypertension: Secondary | ICD-10-CM | POA: Diagnosis not present

## 2013-11-01 DIAGNOSIS — K219 Gastro-esophageal reflux disease without esophagitis: Secondary | ICD-10-CM | POA: Diagnosis not present

## 2013-11-01 DIAGNOSIS — Z23 Encounter for immunization: Secondary | ICD-10-CM | POA: Diagnosis not present

## 2013-11-01 DIAGNOSIS — J449 Chronic obstructive pulmonary disease, unspecified: Secondary | ICD-10-CM | POA: Diagnosis not present

## 2013-11-01 DIAGNOSIS — Z681 Body mass index (BMI) 19 or less, adult: Secondary | ICD-10-CM | POA: Diagnosis not present

## 2013-11-01 DIAGNOSIS — Z0000 Encounter for general adult medical examination without abnormal findings: Secondary | ICD-10-CM | POA: Diagnosis not present

## 2013-11-02 DIAGNOSIS — Z0000 Encounter for general adult medical examination without abnormal findings: Secondary | ICD-10-CM | POA: Diagnosis not present

## 2013-11-02 DIAGNOSIS — Z01419 Encounter for gynecological examination (general) (routine) without abnormal findings: Secondary | ICD-10-CM | POA: Diagnosis not present

## 2013-11-02 DIAGNOSIS — Z681 Body mass index (BMI) 19 or less, adult: Secondary | ICD-10-CM | POA: Diagnosis not present

## 2013-11-02 DIAGNOSIS — Z23 Encounter for immunization: Secondary | ICD-10-CM | POA: Diagnosis not present

## 2013-11-02 DIAGNOSIS — Z79899 Other long term (current) drug therapy: Secondary | ICD-10-CM | POA: Diagnosis not present

## 2013-11-04 DIAGNOSIS — M545 Low back pain, unspecified: Secondary | ICD-10-CM | POA: Diagnosis not present

## 2013-11-04 DIAGNOSIS — M999 Biomechanical lesion, unspecified: Secondary | ICD-10-CM | POA: Diagnosis not present

## 2013-11-04 DIAGNOSIS — M5137 Other intervertebral disc degeneration, lumbosacral region: Secondary | ICD-10-CM | POA: Diagnosis not present

## 2013-11-08 DIAGNOSIS — J449 Chronic obstructive pulmonary disease, unspecified: Secondary | ICD-10-CM | POA: Diagnosis not present

## 2013-12-13 DIAGNOSIS — M999 Biomechanical lesion, unspecified: Secondary | ICD-10-CM | POA: Diagnosis not present

## 2013-12-13 DIAGNOSIS — M545 Low back pain, unspecified: Secondary | ICD-10-CM | POA: Diagnosis not present

## 2013-12-13 DIAGNOSIS — M5137 Other intervertebral disc degeneration, lumbosacral region: Secondary | ICD-10-CM | POA: Diagnosis not present

## 2013-12-15 DIAGNOSIS — M5137 Other intervertebral disc degeneration, lumbosacral region: Secondary | ICD-10-CM | POA: Diagnosis not present

## 2013-12-15 DIAGNOSIS — M999 Biomechanical lesion, unspecified: Secondary | ICD-10-CM | POA: Diagnosis not present

## 2013-12-15 DIAGNOSIS — M545 Low back pain, unspecified: Secondary | ICD-10-CM | POA: Diagnosis not present

## 2013-12-21 ENCOUNTER — Other Ambulatory Visit (HOSPITAL_COMMUNITY): Payer: Self-pay | Admitting: *Deleted

## 2013-12-21 DIAGNOSIS — I714 Abdominal aortic aneurysm, without rupture, unspecified: Secondary | ICD-10-CM

## 2013-12-22 DIAGNOSIS — M545 Low back pain, unspecified: Secondary | ICD-10-CM | POA: Diagnosis not present

## 2013-12-22 DIAGNOSIS — M999 Biomechanical lesion, unspecified: Secondary | ICD-10-CM | POA: Diagnosis not present

## 2013-12-22 DIAGNOSIS — M5137 Other intervertebral disc degeneration, lumbosacral region: Secondary | ICD-10-CM | POA: Diagnosis not present

## 2013-12-27 ENCOUNTER — Ambulatory Visit (HOSPITAL_COMMUNITY): Payer: Medicare Other | Attending: Cardiology | Admitting: Radiology

## 2013-12-27 DIAGNOSIS — I739 Peripheral vascular disease, unspecified: Secondary | ICD-10-CM | POA: Insufficient documentation

## 2013-12-27 DIAGNOSIS — Z8673 Personal history of transient ischemic attack (TIA), and cerebral infarction without residual deficits: Secondary | ICD-10-CM | POA: Diagnosis not present

## 2013-12-27 DIAGNOSIS — I2510 Atherosclerotic heart disease of native coronary artery without angina pectoris: Secondary | ICD-10-CM | POA: Insufficient documentation

## 2013-12-27 DIAGNOSIS — I714 Abdominal aortic aneurysm, without rupture, unspecified: Secondary | ICD-10-CM | POA: Insufficient documentation

## 2013-12-27 DIAGNOSIS — F17200 Nicotine dependence, unspecified, uncomplicated: Secondary | ICD-10-CM | POA: Insufficient documentation

## 2013-12-27 DIAGNOSIS — E119 Type 2 diabetes mellitus without complications: Secondary | ICD-10-CM | POA: Diagnosis not present

## 2013-12-27 DIAGNOSIS — I10 Essential (primary) hypertension: Secondary | ICD-10-CM | POA: Diagnosis not present

## 2013-12-27 DIAGNOSIS — I700 Atherosclerosis of aorta: Secondary | ICD-10-CM

## 2013-12-27 NOTE — Progress Notes (Signed)
Abdominal aorta duplex performed  

## 2014-04-13 ENCOUNTER — Emergency Department (HOSPITAL_COMMUNITY): Payer: Medicare Other

## 2014-04-13 ENCOUNTER — Encounter (HOSPITAL_COMMUNITY): Payer: Self-pay | Admitting: Emergency Medicine

## 2014-04-13 ENCOUNTER — Inpatient Hospital Stay (HOSPITAL_COMMUNITY)
Admission: EM | Admit: 2014-04-13 | Discharge: 2014-04-18 | DRG: 189 | Disposition: A | Discharge status: Home Health | Payer: Medicare Other | Attending: Internal Medicine | Admitting: Internal Medicine

## 2014-04-13 DIAGNOSIS — R0902 Hypoxemia: Secondary | ICD-10-CM

## 2014-04-13 DIAGNOSIS — E43 Unspecified severe protein-calorie malnutrition: Secondary | ICD-10-CM | POA: Diagnosis present

## 2014-04-13 DIAGNOSIS — Z681 Body mass index (BMI) 19 or less, adult: Secondary | ICD-10-CM

## 2014-04-13 DIAGNOSIS — J441 Chronic obstructive pulmonary disease with (acute) exacerbation: Secondary | ICD-10-CM | POA: Diagnosis not present

## 2014-04-13 DIAGNOSIS — I4891 Unspecified atrial fibrillation: Secondary | ICD-10-CM | POA: Diagnosis present

## 2014-04-13 DIAGNOSIS — J449 Chronic obstructive pulmonary disease, unspecified: Secondary | ICD-10-CM | POA: Diagnosis present

## 2014-04-13 DIAGNOSIS — Z86718 Personal history of other venous thrombosis and embolism: Secondary | ICD-10-CM | POA: Diagnosis not present

## 2014-04-13 DIAGNOSIS — R0609 Other forms of dyspnea: Secondary | ICD-10-CM | POA: Diagnosis not present

## 2014-04-13 DIAGNOSIS — I10 Essential (primary) hypertension: Secondary | ICD-10-CM | POA: Diagnosis present

## 2014-04-13 DIAGNOSIS — J9601 Acute respiratory failure with hypoxia: Principal | ICD-10-CM | POA: Diagnosis present

## 2014-04-13 DIAGNOSIS — R069 Unspecified abnormalities of breathing: Secondary | ICD-10-CM | POA: Diagnosis not present

## 2014-04-13 DIAGNOSIS — I739 Peripheral vascular disease, unspecified: Secondary | ICD-10-CM | POA: Diagnosis present

## 2014-04-13 DIAGNOSIS — J810 Acute pulmonary edema: Secondary | ICD-10-CM | POA: Diagnosis not present

## 2014-04-13 DIAGNOSIS — K219 Gastro-esophageal reflux disease without esophagitis: Secondary | ICD-10-CM | POA: Diagnosis present

## 2014-04-13 DIAGNOSIS — R0602 Shortness of breath: Secondary | ICD-10-CM | POA: Diagnosis not present

## 2014-04-13 DIAGNOSIS — J811 Chronic pulmonary edema: Secondary | ICD-10-CM | POA: Diagnosis present

## 2014-04-13 DIAGNOSIS — Z7901 Long term (current) use of anticoagulants: Secondary | ICD-10-CM

## 2014-04-13 DIAGNOSIS — R7989 Other specified abnormal findings of blood chemistry: Secondary | ICD-10-CM

## 2014-04-13 DIAGNOSIS — Z720 Tobacco use: Secondary | ICD-10-CM | POA: Diagnosis not present

## 2014-04-13 DIAGNOSIS — I5031 Acute diastolic (congestive) heart failure: Secondary | ICD-10-CM | POA: Diagnosis present

## 2014-04-13 DIAGNOSIS — I369 Nonrheumatic tricuspid valve disorder, unspecified: Secondary | ICD-10-CM | POA: Diagnosis not present

## 2014-04-13 DIAGNOSIS — I482 Chronic atrial fibrillation, unspecified: Secondary | ICD-10-CM

## 2014-04-13 DIAGNOSIS — Z8673 Personal history of transient ischemic attack (TIA), and cerebral infarction without residual deficits: Secondary | ICD-10-CM

## 2014-04-13 DIAGNOSIS — F17200 Nicotine dependence, unspecified, uncomplicated: Secondary | ICD-10-CM | POA: Diagnosis present

## 2014-04-13 DIAGNOSIS — F17210 Nicotine dependence, cigarettes, uncomplicated: Secondary | ICD-10-CM | POA: Diagnosis present

## 2014-04-13 DIAGNOSIS — J90 Pleural effusion, not elsewhere classified: Secondary | ICD-10-CM | POA: Diagnosis not present

## 2014-04-13 MED ORDER — IPRATROPIUM-ALBUTEROL 0.5-2.5 (3) MG/3ML IN SOLN
3.0000 mL | Freq: Once | RESPIRATORY_TRACT | Status: AC
  Administered 2014-04-13: 3 mL via RESPIRATORY_TRACT
  Filled 2014-04-13: qty 3

## 2014-04-13 MED ORDER — METHYLPREDNISOLONE SODIUM SUCC 125 MG IJ SOLR
125.0000 mg | Freq: Once | INTRAMUSCULAR | Status: AC
  Administered 2014-04-14: 125 mg via INTRAVENOUS
  Filled 2014-04-13: qty 2

## 2014-04-13 NOTE — ED Notes (Signed)
Pt states that she began having shortness of breath/difficulty breathing about a month ago. Pt states symptoms worsened over the last month. Pt reports coughing up clear secretions with occasional yellow/thick secretions. Pt states that she has had difficulty eating and swallowing and reports coughing/gagging while trying to swallow. Pt VSS, on 4L O2 at 97%.

## 2014-04-13 NOTE — ED Provider Notes (Signed)
TIME SEEN: 11:35 PM  CHIEF COMPLAINT: Redness of breath, intermittent chest tightness, cough with clear sputum production  HPI: Patient is a 79 year old female with history of atrial fibrillation, hypertension, peripheral arterial disease, left carotid stenosis, prior history of DVT, CVA, COPD who presents to the emergency department with shortness of breath for the past month has been worsening over the past several days. She has had a cough with thick, clear sputum production. No fevers or chills. She does have occasional chest tightness but none currently. No history of PE. She is on Pradaxa for atrial fibrillation. She denies a known history of CHF. No sick contacts or recent travel. No lower extremity swelling or pain.  Upon EMS arrival, patient had oxygen saturation of 80% at rest on room air. Sats are in the upper 90s on 4 L currently. She does not wear oxygen at home.  PCP is Dr. Gerarda Fraction   ROS: See HPI Constitutional: no fever  Eyes: no drainage  ENT: no runny nose   Cardiovascular:  chest pain  Resp: SOB  GI: no vomiting GU: no dysuria Integumentary: no rash  Allergy: no hives  Musculoskeletal: no leg swelling  Neurological: no slurred speech ROS otherwise negative  PAST MEDICAL HISTORY/PAST SURGICAL HISTORY:  Past Medical History  Diagnosis Date  . Hypertension   . GERD (gastroesophageal reflux disease)   . Cerebrovascular disease   . PAD (peripheral artery disease)     lower extremity PAD with bilateral leg stenting  . TIA (transient ischemic attack)   . COPD (chronic obstructive pulmonary disease)   . CVA 10/25/2008    Qualifier: Diagnosis of  By: [name], CNA, Christy    . Internal carotid artery stenosis 05/13/2012    HIGH GRADE LEFT INTERNAL CAROTID STENOSIS.  S/p  L CEA 05/11/12  . Irregular heart beat   . DVT (deep venous thrombosis)     MEDICATIONS:  Prior to Admission medications   Medication Sig Start Date End Date Taking? Authorizing Provider  cilostazol  (PLETAL) 100 MG tablet 1/2 tab twice a day   Yes Historical Provider, MD  dabigatran (PRADAXA) 150 MG CAPS Take 1 capsule (150 mg total) by mouth every 12 (twelve) hours. 07/14/12  Yes Minus Breeding, MD  hydrochlorothiazide (MICROZIDE) 12.5 MG capsule Take 12.5 mg by mouth daily.   Yes Historical Provider, MD  potassium chloride SA (K-DUR,KLOR-CON) 20 MEQ tablet Take 1 tablet (20 mEq total) by mouth 2 (two) times daily. 06/10/12  Yes Minus Breeding, MD  verapamil (CALAN-SR) 240 MG CR tablet Take 240 mg by mouth daily. At lunch   Yes Historical Provider, MD    ALLERGIES:  Allergies  Allergen Reactions  . Ibuprofen     Gi upset  . Albuterol     Confusion and shortness of breath  . Amoxicillin Hives  . Cephalexin Hives  . Codeine     confusion  . Entex Pac [Pseudoephedrine-Gg & Dm]   . Etanercept     vertigo  . Fluticasone-Salmeterol     Confusion and shortness of breath  . Gualenic Acid   . Lorazepam     hallucinations  . Nubain [Nalbuphine Hcl]   . Oxycodone Hcl Nausea And Vomiting  . Oxycodone-Acetaminophen Nausea And Vomiting  . Roxicodone [Oxycodone Hcl]   . Tylox [Oxycodone-Acetaminophen]     SOCIAL HISTORY:  History  Substance Use Topics  . Smoking status: Current Every Day Smoker -- 1.00 packs/day  . Smokeless tobacco: Current User  . Alcohol Use: No  FAMILY HISTORY: Family History  Problem Relation Age of Onset  . Deep vein thrombosis Sister     Blood Clot in leg    EXAM: BP 138/65  Temp(Src) 98.1 F (36.7 C) (Oral)  Ht 5\' 4"  (1.626 m)  Wt 102 lb (46.267 kg)  BMI 17.50 kg/m2  SpO2 99% CONSTITUTIONAL: Alert and oriented and responds appropriately to questions. Nontoxic, well-nourished, elderly, appears chronically ill, appears older than stated age, in no distress HEAD: Normocephalic EYES: Conjunctivae clear, PERRL ENT: normal nose; no rhinorrhea; moist mucous membranes; pharynx without lesions noted NECK: Supple, no meningismus, no LAD  CARD: RRR; S1  and S2 appreciated; no murmurs, no clicks, no rubs, no gallops RESP: Normal chest excursion without splinting or tachypnea; breath sounds equal bilaterally but there is diffuse rhonchi Wheezing, no renal, patient is speaking full sentences, sats are 90% on 4 L nasal cannula, no respiratory distress ABD/GI: Normal bowel sounds; non-distended; soft, non-tender, no rebound, no guarding BACK:  The back appears normal and is non-tender to palpation, there is no CVA tenderness EXT: Normal ROM in all joints; non-tender to palpation; no edema; normal capillary refill; no cyanosis    SKIN: Normal color for age and race; warm NEURO: Moves all extremities equally PSYCH: The patient's mood and manner are appropriate. Grooming and personal hygiene are appropriate.  MEDICAL DECISION MAKING: Patient here with shortness of breath, hypoxia. Suspect a COPD exacerbation she does have expiratory wheezing. We'll give duo neb, Solu-Medrol. we'll also obtain troponin, BNP and chest x-ray to evaluate for ACS, CHF, PNA. She is not currently in any respiratory distress. She may need admission given her new oxygen requirement.  ED PROGRESS: Labs are unremarkable. Troponin negative. BNP is less than 500. Chest x-ray shows vascular congestion with increased interstitial markings and small bilateral pleural effusions that may be consistent with mild pulmonary edema but given she does not appear full and over learned on exam and has a BNP of 412, do not feel she needs Lasix at this time. We'll give second breathing treatment.   Patient's oxygen saturation quickly dropped to 83% off oxygen when ambulating only several feet. Heart rate jumped to 120s.   Discussed with Dr. Maryland Pink for admission to tele, inpt.     EKG Interpretation  Date/Time:  Wednesday April 13 2014 22:56:21 EDT Ventricular Rate:  89 PR Interval:    QRS Duration: 82 QT Interval:  379 QTC Calculation: 461 R Axis:   142 Text Interpretation:  Atrial  fibrillation Ventricular premature complex Right axis deviation Abnormal lateral Q waves Probable anteroseptal infarct, old No significant change since last tracing Confirmed by Shailah Gibbins,  DO, Keia Rask ([id number]) on 04/14/2014 12:46:48 AM        Almond, DO 04/14/14 0131

## 2014-04-13 NOTE — ED Notes (Signed)
Per EMS: Initial sat - 80% on room air. Pt has had SOB for about a month and became worse tonight. Pt has rubs in lower left lobe. EMS put on 4L and O2sat up to 91-92%. Afib on monitor (hx of Afib). Pt grunting/groaning.

## 2014-04-14 ENCOUNTER — Encounter (HOSPITAL_COMMUNITY): Payer: Self-pay | Admitting: Internal Medicine

## 2014-04-14 DIAGNOSIS — J811 Chronic pulmonary edema: Secondary | ICD-10-CM | POA: Diagnosis not present

## 2014-04-14 DIAGNOSIS — Z8673 Personal history of transient ischemic attack (TIA), and cerebral infarction without residual deficits: Secondary | ICD-10-CM | POA: Diagnosis not present

## 2014-04-14 DIAGNOSIS — I482 Chronic atrial fibrillation: Secondary | ICD-10-CM | POA: Diagnosis not present

## 2014-04-14 DIAGNOSIS — R0602 Shortness of breath: Secondary | ICD-10-CM | POA: Diagnosis not present

## 2014-04-14 DIAGNOSIS — E43 Unspecified severe protein-calorie malnutrition: Secondary | ICD-10-CM | POA: Insufficient documentation

## 2014-04-14 DIAGNOSIS — Z86718 Personal history of other venous thrombosis and embolism: Secondary | ICD-10-CM | POA: Diagnosis not present

## 2014-04-14 DIAGNOSIS — I4891 Unspecified atrial fibrillation: Secondary | ICD-10-CM | POA: Diagnosis present

## 2014-04-14 DIAGNOSIS — I5031 Acute diastolic (congestive) heart failure: Secondary | ICD-10-CM | POA: Diagnosis not present

## 2014-04-14 DIAGNOSIS — J9601 Acute respiratory failure with hypoxia: Principal | ICD-10-CM

## 2014-04-14 DIAGNOSIS — R0609 Other forms of dyspnea: Secondary | ICD-10-CM | POA: Diagnosis not present

## 2014-04-14 DIAGNOSIS — J441 Chronic obstructive pulmonary disease with (acute) exacerbation: Secondary | ICD-10-CM

## 2014-04-14 DIAGNOSIS — J810 Acute pulmonary edema: Secondary | ICD-10-CM | POA: Diagnosis not present

## 2014-04-14 DIAGNOSIS — K219 Gastro-esophageal reflux disease without esophagitis: Secondary | ICD-10-CM | POA: Diagnosis present

## 2014-04-14 DIAGNOSIS — F17210 Nicotine dependence, cigarettes, uncomplicated: Secondary | ICD-10-CM | POA: Diagnosis present

## 2014-04-14 DIAGNOSIS — Z681 Body mass index (BMI) 19 or less, adult: Secondary | ICD-10-CM | POA: Diagnosis not present

## 2014-04-14 DIAGNOSIS — I739 Peripheral vascular disease, unspecified: Secondary | ICD-10-CM | POA: Diagnosis not present

## 2014-04-14 DIAGNOSIS — I369 Nonrheumatic tricuspid valve disorder, unspecified: Secondary | ICD-10-CM | POA: Diagnosis not present

## 2014-04-14 DIAGNOSIS — I10 Essential (primary) hypertension: Secondary | ICD-10-CM | POA: Diagnosis present

## 2014-04-14 DIAGNOSIS — Z720 Tobacco use: Secondary | ICD-10-CM

## 2014-04-14 DIAGNOSIS — Z7901 Long term (current) use of anticoagulants: Secondary | ICD-10-CM | POA: Diagnosis not present

## 2014-04-14 LAB — BASIC METABOLIC PANEL
Anion gap: 9 (ref 5–15)
BUN: 10 mg/dL (ref 6–23)
CALCIUM: 9.1 mg/dL (ref 8.4–10.5)
CO2: 35 mEq/L — ABNORMAL HIGH (ref 19–32)
Chloride: 89 mEq/L — ABNORMAL LOW (ref 96–112)
Creatinine, Ser: 0.66 mg/dL (ref 0.50–1.10)
GFR, EST NON AFRICAN AMERICAN: 83 mL/min — AB (ref >90)
Glucose, Bld: 110 mg/dL — ABNORMAL HIGH (ref 70–99)
Potassium: 4.3 mEq/L (ref 3.7–5.3)
SODIUM: 133 meq/L — AB (ref 137–147)

## 2014-04-14 LAB — CBC WITH DIFFERENTIAL/PLATELET
BASOS PCT: 1 % (ref 0–1)
Basophils Absolute: 0.1 10*3/uL (ref 0.0–0.1)
EOS ABS: 0.6 10*3/uL (ref 0.0–0.7)
EOS PCT: 10 % — AB (ref 0–5)
HCT: 42.7 % (ref 36.0–46.0)
Hemoglobin: 14.2 g/dL (ref 12.0–15.0)
Lymphocytes Relative: 25 % (ref 12–46)
Lymphs Abs: 1.6 10*3/uL (ref 0.7–4.0)
MCH: 26.3 pg (ref 26.0–34.0)
MCHC: 33.3 g/dL (ref 30.0–36.0)
MCV: 79.1 fL (ref 78.0–100.0)
Monocytes Absolute: 0.7 10*3/uL (ref 0.1–1.0)
Monocytes Relative: 12 % (ref 3–12)
NEUTROS PCT: 52 % (ref 43–77)
Neutro Abs: 3.3 10*3/uL (ref 1.7–7.7)
PLATELETS: 156 10*3/uL (ref 150–400)
RBC: 5.4 MIL/uL — ABNORMAL HIGH (ref 3.87–5.11)
RDW: 14.8 % (ref 11.5–15.5)
WBC: 6.2 10*3/uL (ref 4.0–10.5)

## 2014-04-14 LAB — TROPONIN I: Troponin I: <0.3 ng/mL (ref <0.30)

## 2014-04-14 LAB — TSH: TSH: 2.44 u[IU]/mL (ref 0.350–4.500)

## 2014-04-14 LAB — PRO B NATRIURETIC PEPTIDE: Pro B Natriuretic peptide (BNP): 412.3 pg/mL (ref 0–450)

## 2014-04-14 MED ORDER — LEVOFLOXACIN IN D5W 500 MG/100ML IV SOLN
INTRAVENOUS | Status: AC
  Filled 2014-04-14: qty 100

## 2014-04-14 MED ORDER — FUROSEMIDE 10 MG/ML IJ SOLN
40.0000 mg | Freq: Once | INTRAMUSCULAR | Status: AC
  Administered 2014-04-14: 40 mg via INTRAVENOUS
  Filled 2014-04-14: qty 4

## 2014-04-14 MED ORDER — CILOSTAZOL 100 MG PO TABS
50.0000 mg | ORAL_TABLET | Freq: Two times a day (BID) | ORAL | Status: DC
Start: 2014-04-14 — End: 2014-04-18
  Administered 2014-04-14 – 2014-04-18 (×9): 50 mg via ORAL
  Filled 2014-04-14: qty 0.5
  Filled 2014-04-14: qty 1
  Filled 2014-04-14 (×4): qty 0.5
  Filled 2014-04-14: qty 1
  Filled 2014-04-14: qty 0.5
  Filled 2014-04-14 (×2): qty 1
  Filled 2014-04-14 (×4): qty 0.5

## 2014-04-14 MED ORDER — METHYLPREDNISOLONE SODIUM SUCC 125 MG IJ SOLR
60.0000 mg | Freq: Four times a day (QID) | INTRAMUSCULAR | Status: DC
  Administered 2014-04-14 – 2014-04-16 (×10): 60 mg via INTRAVENOUS
  Filled 2014-04-14 (×10): qty 2

## 2014-04-14 MED ORDER — LEVOFLOXACIN IN D5W 500 MG/100ML IV SOLN
500.0000 mg | INTRAVENOUS | Status: DC
  Administered 2014-04-14 – 2014-04-15 (×2): 500 mg via INTRAVENOUS
  Filled 2014-04-14 (×5): qty 100

## 2014-04-14 MED ORDER — LEVALBUTEROL HCL 0.63 MG/3ML IN NEBU
0.6300 mg | INHALATION_SOLUTION | RESPIRATORY_TRACT | Status: DC | PRN
Start: 2014-04-14 — End: 2014-04-18
  Administered 2014-04-16: 0.63 mg via RESPIRATORY_TRACT
  Filled 2014-04-14: qty 3

## 2014-04-14 MED ORDER — ACETAMINOPHEN 325 MG PO TABS
650.0000 mg | ORAL_TABLET | Freq: Four times a day (QID) | ORAL | Status: DC | PRN
  Administered 2014-04-14 – 2014-04-16 (×5): 650 mg via ORAL
  Filled 2014-04-14 (×7): qty 2

## 2014-04-14 MED ORDER — LEVALBUTEROL HCL 1.25 MG/0.5ML IN NEBU
1.2500 mg | INHALATION_SOLUTION | Freq: Once | RESPIRATORY_TRACT | Status: AC
  Administered 2014-04-14: 1.25 mg via RESPIRATORY_TRACT
  Filled 2014-04-14: qty 0.5

## 2014-04-14 MED ORDER — ONDANSETRON HCL 4 MG PO TABS: 4.0000 mg | ORAL_TABLET | Freq: Four times a day (QID) | ORAL | Status: DC | PRN

## 2014-04-14 MED ORDER — SODIUM CHLORIDE 0.9 % IV SOLN: 250.0000 mL | INTRAVENOUS | Status: DC | PRN

## 2014-04-14 MED ORDER — ENSURE COMPLETE PO LIQD
237.0000 mL | Freq: Two times a day (BID) | ORAL | Status: DC
  Administered 2014-04-14 – 2014-04-17 (×3): 237 mL via ORAL

## 2014-04-14 MED ORDER — SODIUM CHLORIDE 0.9 % IJ SOLN
3.0000 mL | Freq: Two times a day (BID) | INTRAMUSCULAR | Status: DC
  Administered 2014-04-14 – 2014-04-18 (×5): 3 mL via INTRAVENOUS

## 2014-04-14 MED ORDER — POTASSIUM CHLORIDE CRYS ER 20 MEQ PO TBCR
20.0000 meq | EXTENDED_RELEASE_TABLET | Freq: Two times a day (BID) | ORAL | Status: DC
  Administered 2014-04-14 – 2014-04-18 (×8): 20 meq via ORAL
  Filled 2014-04-14 (×9): qty 1

## 2014-04-14 MED ORDER — LEVALBUTEROL HCL 0.63 MG/3ML IN NEBU
0.6300 mg | INHALATION_SOLUTION | RESPIRATORY_TRACT | Status: DC
  Administered 2014-04-14 – 2014-04-15 (×10): 0.63 mg via RESPIRATORY_TRACT
  Filled 2014-04-14 (×12): qty 3

## 2014-04-14 MED ORDER — DABIGATRAN ETEXILATE MESYLATE 150 MG PO CAPS
150.0000 mg | ORAL_CAPSULE | Freq: Two times a day (BID) | ORAL | Status: DC
Start: 2014-04-14 — End: 2014-04-18
  Administered 2014-04-14 – 2014-04-18 (×9): 150 mg via ORAL
  Filled 2014-04-14 (×14): qty 1

## 2014-04-14 MED ORDER — ONDANSETRON HCL 4 MG/2ML IJ SOLN: 4.0000 mg | Freq: Four times a day (QID) | INTRAMUSCULAR | Status: DC | PRN

## 2014-04-14 MED ORDER — SODIUM CHLORIDE 0.9 % IJ SOLN: 3.0000 mL | INTRAMUSCULAR | Status: DC | PRN

## 2014-04-14 MED ORDER — IPRATROPIUM BROMIDE 0.02 % IN SOLN
0.5000 mg | RESPIRATORY_TRACT | Status: DC
  Administered 2014-04-14 – 2014-04-15 (×10): 0.5 mg via RESPIRATORY_TRACT
  Filled 2014-04-14 (×12): qty 2.5

## 2014-04-14 MED ORDER — VERAPAMIL HCL ER 240 MG PO TBCR
240.0000 mg | EXTENDED_RELEASE_TABLET | Freq: Every day | ORAL | Status: DC
Start: 2014-04-14 — End: 2014-04-18
  Administered 2014-04-14 – 2014-04-18 (×5): 240 mg via ORAL
  Filled 2014-04-14 (×5): qty 1

## 2014-04-14 MED ORDER — SODIUM CHLORIDE 0.9 % IJ SOLN
3.0000 mL | Freq: Two times a day (BID) | INTRAMUSCULAR | Status: DC
  Administered 2014-04-14 – 2014-04-17 (×4): 3 mL via INTRAVENOUS

## 2014-04-14 MED ORDER — ACETAMINOPHEN 650 MG RE SUPP: 650.0000 mg | Freq: Four times a day (QID) | RECTAL | Status: DC | PRN

## 2014-04-14 NOTE — Evaluation (Signed)
Occupational Therapy Evaluation Patient Details Name: Caroline [name] MRN: [id number] DOB: [birth year] Today's Date: 04/14/2014    History of Present Illness Caroline [name] is a 79 y.o. female with a past medical history of COPD, atrial fibrillation on anticoagulation, peripheral arterial disease who has been short of breath for about a month. However, this has gotten worse in the last few days. She also reported symptoms suggestive of orthopnea. Mainly the shortness of breath has been with exertion. She has also noticed leg swelling for the last few days. She has had a cough with clear expectoration.   Clinical Impression   Patient in bathroom upon therapy arrival. Patient ambulated from toilet to bed without device, although was slightly unsteady as she held onto doorways and walls for support. Patient reports that she has a RW at home that she uses on occasion when she feels like her feet are not moving as they should. Suggested the option of having Home Health therapist come to her home for therapy when she initially goes home to increase her strength. Patient was not open to idea and feels that she will be fine once she returned home.      Follow Up Recommendations  No OT follow up    Equipment Recommendations  None recommended by OT       Precautions / Restrictions Precautions Precautions: Fall      Mobility Bed Mobility Overal bed mobility: Independent                Transfers Overall transfer level: Needs assistance Equipment used: None Transfers: Stand Pivot Transfers;Sit to/from Stand Sit to Stand: Min guard Stand pivot transfers: Min guard       General transfer comment: patient held onto doorways and walls when ambulating from toilet to bed.    Balance Overall balance assessment: Needs assistance Sitting-balance support: Feet supported Sitting balance-Leahy Scale: Normal     Standing balance support: Single extremity supported;During functional  activity Standing balance-Leahy Scale: Fair Standing balance comment: Pt rocked backwards when standing up from toilet.                            ADL Overall ADL's : Needs assistance/impaired                     Lower Body Dressing: Independent;Sit to/from stand;Bed level   Toilet Transfer: Min guard;Ambulation;Regular Museum/gallery exhibitions officer and Hygiene: Min guard;Sit to/from stand         General ADL Comments: When standing from toilet, patient's balance was slightly off causing her to rock back on her heels.               Pertinent Vitals/Pain Pain Assessment: 0-10 Pain Score: 1  Pain Location: Head Pain Descriptors / Indicators: Aching Pain Intervention(s): Other (comment) (patient declines)     Hand Dominance Right   Extremity/Trunk Assessment Upper Extremity Assessment Upper Extremity Assessment: Overall WFL for tasks assessed   Lower Extremity Assessment Lower Extremity Assessment: Defer to PT evaluation       Communication Communication Communication: No difficulties   Cognition Arousal/Alertness: Awake/alert Behavior During Therapy: WFL for tasks assessed/performed Overall Cognitive Status: Within Functional Limits for tasks assessed                                Home Living Family/patient expects to be discharged to::  Private residence Living Arrangements: Spouse/significant other Available Help at Discharge: Family;Available PRN/intermittently Type of Home: House Home Access: Stairs to enter [employer] of Steps: 5-6 Entrance Stairs-Rails: Can reach both;Left;Right Home Layout: One level     Bathroom Shower/Tub: Teacher, early years/pre: Handicapped height     Home Equipment: Grab bars - tub/shower;Grab bars - toilet;Shower seat;Walker - 2 wheels;Cane - single point   Additional Comments: Husband is available 24 hours for supervision however has his own impairments  and uses a RW. Pt reports that he would fall faster than she would if he tried to help with household tasks. Pt does not use shower at home due to almost falling many times. Patient sponge bathes.       Prior Functioning/Environment Level of Independence: Independent                                       End of Session Equipment Utilized During Treatment: Oxygen  Activity Tolerance: Patient tolerated treatment well Patient left: in bed;with family/visitor present;with call bell/phone within reach   Time: 1400-1417 OT Time Calculation (min): 17 min Charges:  OT General Charges $OT Visit: 1 Procedure OT Evaluation $Initial OT Evaluation Tier I: 1 Procedure  Ailene Ravel, OTR/L,CBIS   04/14/2014, 3:06 PM

## 2014-04-14 NOTE — Progress Notes (Signed)
INITIAL NUTRITION ASSESSMENT  DOCUMENTATION CODES Per approved criteria  -Severe malnutrition in the context of chronic illness   Pt meets criteria for severe MALNUTRITION in the context of chronic illness as evidenced by severe fat and muscle depletion.  INTERVENTION: Ensure Complete po BID, each supplement provides 350 kcal and 13 grams of protein  NUTRITION DIAGNOSIS: Inadequate oral intake related to decreased appetite as evidenced by hx of wt loss, severe fat and muscle depeltion.   Goal: Pt will meet >90% of estimated nutritional needs  Monitor:  PO/supplement intake, labs, weight changes, I/O's  Reason for Assessment: MST=2  79 y.o. female  Admitting Dx: Acute respiratory failure with hypoxia  Caroline [name] is a 79 y.o. female with a past medical history of COPD, atrial fibrillation on anticoagulation, peripheral arterial disease who has been short of breath for about a month. However, this has gotten worse in the last few days. She also reports symptoms suggestive of orthopnea. Denies being on home oxygen. Last night the shortness of breath got worse. When EMS arrived at home her saturation was 80%.   ASSESSMENT: Pt admitted with ARF with hypoxia. Hx obtained by pt, daughter, and husband at bedside.  Pt confirms progressive weight loss over the past 1-2 years. She reports UBW of 145#, which she last weighed around 2 years ago, per her report. He daughter reports that she has decreased muscle mass and has gone from a size 12 to a size 8 in clothing. Daughter estimated pt has lost 20-25# in the past year. Documented wt hx reveals wt stability over the past year, however.  Pt reports appetite is fairly good at home. However, daughter reports is varies depending on how pt is feeling. Pt reports that she ate her oatmeal, eggs, and grits. Suspect dietary noncompliance with low sodium diet at home, as pt was requesting high sodium foods such as pickles and french fries.  Educated pt  on importance of good PO intake to support healing process. Offered supplement as well. Pt was initially reluctant, but agreeable with encouragement of daughter. Daughter reports they have been contemplating supplements for a while; also encouraged home use of supplement. Daughter was very receptive to recommendations.  Labs reviewed. Na: 133. Cl: 89.CO@: 35. Glucose: 110.Glucose elevated likely due to initiation of steroids.   Nutrition Focused Physical Exam:  Subcutaneous Fat:  Orbital Region: severe depletion Upper Arm Region: severe depletion Thoracic and Lumbar Region: severe depletion  Muscle:  Temple Region: severe depletion Clavicle Bone Region: severe depletion Clavicle and Acromion Bone Region: severe depletion Scapular Bone Region: severe depletion Dorsal Hand: severe depletion Patellar Region: severe depletion Anterior Thigh Region: severe depletion Posterior Calf Region: moderate depletion  Edema: none present  Height: Ht Readings from Last 1 Encounters:  04/14/14 5\' 4"  (1.626 m)    Weight: Wt Readings from Last 1 Encounters:  04/14/14 112 lb 1.6 oz (50.848 kg)    Ideal Body Weight: 120#  % Ideal Body Weight: 93%  Wt Readings from Last 10 Encounters:  04/14/14 112 lb 1.6 oz (50.848 kg)  06/24/13 110 lb (49.896 kg)  11/04/12 114 lb (51.71 kg)  06/10/12 121 lb (54.885 kg)  05/28/12 119 lb (53.978 kg)  05/13/12 118 lb 2.7 oz (53.6 kg)  05/13/12 118 lb 2.7 oz (53.6 kg)  05/13/12 118 lb 2.7 oz (53.6 kg)  04/23/12 117 lb (53.071 kg)  07/10/11 126 lb 12.2 oz (57.5 kg)    Usual Body Weight: 145#  % Usual Body Weight: 77%  BMI:  Body mass index is 19.23 kg/(m^2). Normal weight range  Estimated Nutritional Needs: Kcal: 1500-1700 Protein: 66-76 grams Fluid: 1.5-1.7 L  Skin: Intact  Diet Order: Cardiac  EDUCATION NEEDS: -Education needs addressed   Intake/Output Summary (Last 24 hours) at 04/14/14 1000 Last data filed at 04/14/14 0900  Gross per  24 hour  Intake    240 ml  Output    650 ml  Net   -410 ml    Last BM: 04/12/14  Labs:   Recent Labs Lab 04/14/14 0019  NA 133*  K 4.3  CL 89*  CO2 35*  BUN 10  CREATININE 0.66  CALCIUM 9.1  GLUCOSE 110*    CBG (last 3)  No results found for this basename: GLUCAP,  in the last 72 hours  Scheduled Meds: . cilostazol  50 mg Oral BID  . dabigatran  150 mg Oral Q12H  . ipratropium  0.5 mg Nebulization Q4H  . levalbuterol  0.63 mg Nebulization Q4H  . levofloxacin (LEVAQUIN) IV  500 mg Intravenous Q24H  . methylPREDNISolone (SOLU-MEDROL) injection  60 mg Intravenous Q6H  . potassium chloride SA  20 mEq Oral BID  . sodium chloride  3 mL Intravenous Q12H  . sodium chloride  3 mL Intravenous Q12H  . verapamil  240 mg Oral QAC lunch    Continuous Infusions:   Past Medical History  Diagnosis Date  . Hypertension   . GERD (gastroesophageal reflux disease)   . Cerebrovascular disease   . PAD (peripheral artery disease)     lower extremity PAD with bilateral leg stenting  . TIA (transient ischemic attack)   . COPD (chronic obstructive pulmonary disease)   . CVA 10/25/2008    Qualifier: Diagnosis of  By: [name], CNA, Christy    . Internal carotid artery stenosis 05/13/2012    HIGH GRADE LEFT INTERNAL CAROTID STENOSIS.  S/p  L CEA 05/11/12  . Irregular heart beat   . DVT (deep venous thrombosis)     Past Surgical History  Procedure Laterality Date  . Appendectomy    . Partial hysterectomy    . Gallbladder surgery    . Cholecystectomy    . Endarterectomy  05/11/2012    Procedure: ENDARTERECTOMY CAROTID;  Surgeon: [name], MD;  Location: [facility];  Service: Vascular;  Laterality: Left;  . Patch angioplasty  05/11/2012    Procedure: PATCH ANGIOPLASTY;  Surgeon: [name], MD;  Location: [facility] OR;  Service: Vascular;  Laterality: Left;  with Dacron Patch Angioplasty  . Carotid endarterectomy      Caitlin Ainley A. Jimmye Norman, RD, LDN Pager: [phone]

## 2014-04-14 NOTE — Evaluation (Signed)
Physical Therapy Evaluation Patient Details Name: Caroline [name] MRN: [id number] DOB: 1935/01/20 Today's Date: 04/14/2014   History of Present Illness  Caroline [name] is a 79 y.o. female with a past medical history of COPD, atrial fibrillation on anticoagulation, peripheral arterial disease who has been short of breath for about a month. However, this has gotten worse in the last few days. She also reported symptoms suggestive of orthopnea. Mainly the shortness of breath has been with exertion. She has also noticed leg swelling for the last few days. She has had a cough with clear expectoration.  Clinical Impression  Pt is a 79 year old female who presents to PT with dx of acute respiratory failure with hypoxia.  Pt reports some SOB, though does not report use of O2 at home.  Per pt/family pt has been getting out of bed with HHA to go to the bathroom.  Pt reports feeling unbalanced and requiring HHA.  During evaluation, pt was (I) with bed mobility skills, min guard with HHA for transfers, and min guard with HHA vs. Furniture walking during gait of 15 feet.  Noted decreased foot clearance (B) with gait, and pt required multiple standing rest breaks secondary to decreased activity tolerance/SOB with activity.  O2 not worn during gait as pt reports "it's bothering my ears" and stating she does not wear O2 at home, though O2 reapplied after evaluation.  Recommend continued PT to address strengthening, balance, and activity tolerance with transitition to HHPT at discharge.  Educated pt/family on importance of use of AD for safe ambulation skills as pt did require HHA or furniture walking for gait in room today.  Family/patient report understanding.  Attempted use of AD during gait assessment, though pt refused use as she does not use AD at home (though she does have personal equipment if needed).  No DME recommendations.     Follow Up Recommendations Home health PT    Equipment Recommendations  None  recommended by PT       Precautions / Restrictions Precautions Precautions: Fall Restrictions Weight Bearing Restrictions: No      Mobility  Bed Mobility Overal bed mobility: Independent                Transfers Overall transfer level: Needs assistance Equipment used: 1 person hand held assist Transfers: Sit to/from Stand Sit to Stand: Min guard Stand pivot transfers: Min guard       General transfer comment: patient held onto doorways and walls when ambulating from toilet to bed.  Ambulation/Gait Ambulation/Gait assistance: Min guard Ambulation Distance (Feet): 15 Feet Assistive device: 1 person hand held assist (Furniture walking) Gait Pattern/deviations: Decreased dorsiflexion - right;Decreased dorsiflexion - left   Gait velocity interpretation: Below normal speed for age/gender General Gait Details: Gait distance limited secondary to fatigue/SOB (O2 not worn during gait as pt reports "it's bothing my ears").  Multiple standing rest breaks during gait, with alternating furniture walking vs. HHA.  Questioned pt on using RW vs. cane during gait for safe ambulation, though pt refused.      Balance Overall balance assessment: Needs assistance Sitting-balance support: Feet supported Sitting balance-Leahy Scale: Normal     Standing balance support: Single extremity supported;During functional activity Standing balance-Leahy Scale: Fair Standing balance comment: Pt rocked backwards when standing up from toilet.                             Pertinent Vitals/Pain Pain Assessment:  No/denies pain Pain Score: 1  Pain Location: Head Pain Descriptors / Indicators: Aching Pain Intervention(s): Other (comment) (patient declines)    Home Living Family/patient expects to be discharged to:: Private residence Living Arrangements: Spouse/significant other Available Help at Discharge: Family;Available PRN/intermittently;Available 24 hours/day (Husband available  24/7, other family available PRN) Type of Home: House Home Access: Stairs to enter Entrance Stairs-Rails: Can reach both Entrance Stairs-Number of Steps: 5-6 Home Layout: One level Home Equipment: Grab bars - tub/shower;Grab bars - toilet;Shower seat;Walker - 2 wheels;Cane - single point Additional Comments: Tub shower - sponge baths    Prior Function Level of Independence: Independent               Hand Dominance   Dominant Hand: Right    Extremity/Trunk Assessment   Upper Extremity Assessment: Defer to OT evaluation           Lower Extremity Assessment: Generalized weakness         Communication   Communication: No difficulties  Cognition Arousal/Alertness: Awake/alert Behavior During Therapy: WFL for tasks assessed/performed Overall Cognitive Status: Within Functional Limits for tasks assessed                        Assessment/Plan    PT Assessment Patient needs continued PT services  PT Diagnosis Abnormality of gait;Generalized weakness   PT Problem List Decreased strength;Decreased activity tolerance;Decreased balance;Decreased mobility;Cardiopulmonary status limiting activity  PT Treatment Interventions Gait training;Functional mobility training;Therapeutic activities;Therapeutic exercise;Stair training   PT Goals (Current goals can be found in the Care Plan section) Acute Rehab PT Goals Patient Stated Goal: none stated    Frequency Min 3X/week    End of Session Equipment Utilized During Treatment: Gait belt Activity Tolerance: Patient limited by fatigue;Other (comment) (Limited by SOB) Patient left: in bed;with call bell/phone within reach;with family/visitor present           Time: 5573-2202 PT Time Calculation (min): 17 min   Charges:   PT Evaluation $Initial PT Evaluation Tier I: 1 Procedure      Laqueisha Catalina 04/14/2014, 3:49 PM

## 2014-04-14 NOTE — Progress Notes (Addendum)
   [facility]                                                                              Patient Demographics  Caroline [name], is a 79 y.o. female, DOB - Aug 20, 1934, ZMO:[id number]  Admit date - 04/13/2014   Admitting Physician Bonnielee Haff, MD  Outpatient Primary MD for the patient is Glo Herring., MD  LOS - 1   Chief Complaint  Patient presents with  . Shortness of Breath      HPI on 04/14/2014 Caroline [name] is a 79 y.o. female with a past medical history of COPD, atrial fibrillation on anticoagulation, peripheral arterial disease who has been short of breath for about a month. However, this has gotten worse in the last few days. She also reported symptoms suggestive of orthopnea. Mainly the shortness of breath has been with exertion. She has also noticed leg swelling for the last few days. She has had a cough with clear expectoration. Denied any blood in the sputum. She has had wheezing as well. Denied any nausea, vomiting. Denied chest pain. No fever. No chills. Has been dizzy at times, but denied any syncopal episode. Denied being on home oxygen. Last night the shortness of breath got worse. When EMS arrived at home her saturation was 80%. She was placed on oxygen and was brought into the emergency department. She was wheezing quite profusely and was given nebulizer treatments, after which patient has shown some improvement. But with ambulation in the ED, her saturations dropped again.   Assessment & Plan   Patient admitted early this morning by Dr.Gokul [place] Pink.  Agree with current assessment and plan.  Acute respiratory failure with hypoxia/acute COPD exacerbation -Patient requires nasal cannula, however does not use this at home -Possibly secondary to tobacco abuse -Continue nebulizer treatments, supplemental oxygen, steroids, antibiotics  Probable mild pulmonary edema -Noted on chest x-ray: Vascular congestion, increased interstitial markings, small bilateral  pleural effusions, concerning for mild pulmonary edema -Echocardiogram pending -Patient was given Lasix at admission, will continue low-dose  -Will monitor her intake and output as well as daily weights -Troponins were cycled and found to be negative.  Atrial fibrillation on anticoagulation -Continue Pradaxa  Peripheral vascular disease -Continue Pletal  Severe malnutrition in the context of chronic illness -Continue oral feeding supplement.  Tobacco abuse -Counseled on smoking cessation -Patient refuses nicotine patch, as it made her syncopal in the past.  Code Status: Full  Family Communication: None At bedside  Disposition Plan: Admitted  Time Spent in minutes   30  minutes  Procedures  Echocardiogram  Consults   None  DVT Prophylaxis  Pradaxa  Alexya Mcdaris D.O. on 04/14/2014 at 10:23 AM  Between 7am to 7pm - Pager - [phone]  After 7pm go to [email or web address] - password [email or web address]  And look for the night coverage person covering for me after hours  [facility] Office  [phone]

## 2014-04-14 NOTE — H&P (Signed)
[facility] History and Physical  Caroline [name] OLI:[id number] DOB: 1934/11/08 DOA: 04/13/2014   PCP: Glo Herring., MD  Specialists: None  Chief Complaint: Shortness of breath  HPI: Caroline [name] is a 79 y.o. female with a past medical history of COPD, atrial fibrillation on anticoagulation, peripheral arterial disease who has been short of breath for about a month. However, this has gotten worse in the last few days. She also reports symptoms suggestive of orthopnea. Mainly the shortness of breath has been with exertion. She has also noticed leg swelling for the last few days. She's had a cough with clear expectoration. Denies any blood in the sputum. She's had wheezing as well. Denies any nausea, vomiting. Denies chest pain. No fever. No chills. Has been dizzy at times, but denies any syncopal episode. Denies being on home oxygen. Last night the shortness of breath got worse. When EMS arrived at home her saturation was 80%. She was placed on oxygen and was brought into the emergency department. She was wheezing quite profusely and was given nebulizer treatments, after which patient has shown some improvement. But with ambulation in the ED, her saturations dropped again.   Home Medications: Prior to Admission medications   Medication Sig Start Date End Date Taking? Authorizing Provider  cilostazol (PLETAL) 100 MG tablet 1/2 tab twice a day   Yes Historical Provider, MD  dabigatran (PRADAXA) 150 MG CAPS Take 1 capsule (150 mg total) by mouth every 12 (twelve) hours. 07/14/12  Yes Minus Breeding, MD  hydrochlorothiazide (MICROZIDE) 12.5 MG capsule Take 12.5 mg by mouth daily.   Yes Historical Provider, MD  potassium chloride SA (K-DUR,KLOR-CON) 20 MEQ tablet Take 1 tablet (20 mEq total) by mouth 2 (two) times daily. 06/10/12  Yes Minus Breeding, MD  verapamil (CALAN-SR) 240 MG CR tablet Take 240 mg by mouth daily. At lunch   Yes Historical Provider, MD    Allergies:  Allergies    Allergen Reactions  . Ibuprofen     Gi upset  . Albuterol     Confusion and shortness of breath  . Amoxicillin Hives  . Cephalexin Hives  . Codeine     confusion  . Entex Pac [Pseudoephedrine-Gg & Dm]   . Etanercept     vertigo  . Fluticasone-Salmeterol     Confusion and shortness of breath  . Gualenic Acid   . Lorazepam     hallucinations  . Nubain [Nalbuphine Hcl]   . Oxycodone Hcl Nausea And Vomiting  . Oxycodone-Acetaminophen Nausea And Vomiting  . Roxicodone [Oxycodone Hcl]   . Tylox [Oxycodone-Acetaminophen]     Past Medical History: Past Medical History  Diagnosis Date  . Hypertension   . GERD (gastroesophageal reflux disease)   . Cerebrovascular disease   . PAD (peripheral artery disease)     lower extremity PAD with bilateral leg stenting  . TIA (transient ischemic attack)   . COPD (chronic obstructive pulmonary disease)   . CVA 10/25/2008    Qualifier: Diagnosis of  By: [name], CNA, Christy    . Internal carotid artery stenosis 05/13/2012    HIGH GRADE LEFT INTERNAL CAROTID STENOSIS.  S/p  L CEA 05/11/12  . Irregular heart beat   . DVT (deep venous thrombosis)     Past Surgical History  Procedure Laterality Date  . Appendectomy    . Partial hysterectomy    . Gallbladder surgery    . Cholecystectomy    . Endarterectomy  05/11/2012    Procedure: ENDARTERECTOMY CAROTID;  Surgeon: [name], MD;  Location: [facility];  Service: Vascular;  Laterality: Left;  . Patch angioplasty  05/11/2012    Procedure: PATCH ANGIOPLASTY;  Surgeon: [name], MD;  [facility];  Service: Vascular;  Laterality: Left;  with Dacron Patch Angioplasty  . Carotid endarterectomy      Social History: Patient lives in [place] with her husband. She smokes a half to one pack of cigarettes on a daily basis. Husband. Smokes up to 2 packs of cigarettes on a daily basis. No alcohol use. No illicit drug use. Uses a cane to ambulate.  Family History:  Family History  Problem  Relation Age of Onset  . Deep vein thrombosis Sister     Blood Clot in leg     Review of Systems - History obtained from the patient General ROS: positive for  - fatigue Psychological ROS: negative Ophthalmic ROS: negative ENT ROS: negative Allergy and Immunology ROS: negative Hematological and Lymphatic ROS: negative Endocrine ROS: negative Respiratory ROS: as in hpi Cardiovascular ROS: as in hpi Gastrointestinal ROS: no abdominal pain, change in bowel habits, or black or bloody stools Genito-Urinary ROS: no dysuria, trouble voiding, or hematuria Musculoskeletal ROS: negative Neurological ROS: no TIA or stroke symptoms Dermatological ROS: negative  Physical Examination  Filed Vitals:   04/14/14 0030 04/14/14 0100 04/14/14 0130 04/14/14 0145  BP: 142/79 118/57    Pulse: 95 79 85 98  Temp:      TempSrc:      Resp: 15 21 20 19   Height:      Weight:      SpO2: 98% 100% 97% 97%    BP 118/57  Pulse 98  Temp(Src) 98.1 F (36.7 C) (Oral)  Resp 19  Ht 5' 4"  (1.626 m)  Wt 46.267 kg (102 lb)  BMI 17.50 kg/m2  SpO2 97%  General appearance: alert, cooperative, appears stated age and no distress Head: Normocephalic, without obvious abnormality, atraumatic Eyes: conjunctivae/corneas clear. PERRL, EOM's intact. Throat: lips, mucosa, and tongue normal; teeth and gums normal Neck: no adenopathy, no carotid bruit, no JVD, supple, symmetrical, trachea midline and thyroid not enlarged, symmetric, no tenderness/mass/nodules Back: symmetric, no curvature. ROM normal. No CVA tenderness. Resp: Poor air entry bilaterally. Few scattered wheezing. A few fine crackles at the bases bilaterally. Cardio: S1-S2 is irregularly irregular. No S3, S4. No rubs, murmurs, or bruit.  pedal edema is noted Bilateral extremities GI: soft, non-tender; bowel sounds normal; no masses,  no organomegaly Extremities: edema Pedal edema is noted bilateral lower extremities Pulses: Poorly palpable, but  present Skin: Skin color, texture, turgor normal. No rashes or lesions Neurologic: Alert and oriented x3. No focal neurological deficits are noted.  Laboratory Data: Results for orders placed during the hospital encounter of 04/13/14 (from the past 48 hour(s))  CBC WITH DIFFERENTIAL     Status: Abnormal   Collection Time    04/14/14 12:19 AM      Result Value Ref Range   WBC 6.2  4.0 - 10.5 K/uL   RBC 5.40 (*) 3.87 - 5.11 MIL/uL   Hemoglobin 14.2  12.0 - 15.0 g/dL   HCT 42.7  36.0 - 46.0 %   MCV 79.1  78.0 - 100.0 fL   MCH 26.3  26.0 - 34.0 pg   MCHC 33.3  30.0 - 36.0 g/dL   RDW 14.8  11.5 - 15.5 %   Platelets 156  150 - 400 K/uL   Neutrophils Relative % 52  43 - 77 %  Neutro Abs 3.3  1.7 - 7.7 K/uL   Lymphocytes Relative 25  12 - 46 %   Lymphs Abs 1.6  0.7 - 4.0 K/uL   Monocytes Relative 12  3 - 12 %   Monocytes Absolute 0.7  0.1 - 1.0 K/uL   Eosinophils Relative 10 (*) 0 - 5 %   Eosinophils Absolute 0.6  0.0 - 0.7 K/uL   Basophils Relative 1  0 - 1 %   Basophils Absolute 0.1  0.0 - 0.1 K/uL  BASIC METABOLIC PANEL     Status: Abnormal   Collection Time    04/14/14 12:19 AM      Result Value Ref Range   Sodium 133 (*) 137 - 147 mEq/L   Potassium 4.3  3.7 - 5.3 mEq/L   Chloride 89 (*) 96 - 112 mEq/L   CO2 35 (*) 19 - 32 mEq/L   Glucose, Bld 110 (*) 70 - 99 mg/dL   BUN 10  6 - 23 mg/dL   Creatinine, Ser 0.66  0.50 - 1.10 mg/dL   Calcium 9.1  8.4 - 10.5 mg/dL   GFR calc non Af Amer 83 (*) >90 mL/min   GFR calc Af Amer >90  >90 mL/min   Comment: (NOTE)     The eGFR has been calculated using the CKD EPI equation.     This calculation has not been validated in all clinical situations.     eGFR's persistently <90 mL/min signify possible Chronic Kidney     Disease.   Anion gap 9  5 - 15  TROPONIN I     Status: None   Collection Time    04/14/14 12:19 AM      Result Value Ref Range   Troponin I <0.30  <0.30 ng/mL   Comment:            Due to the release kinetics of  cTnI,     a negative result within the first hours     of the onset of symptoms does not rule out     myocardial infarction with certainty.     If myocardial infarction is still suspected,     repeat the test at appropriate intervals.  PRO B NATRIURETIC PEPTIDE     Status: None   Collection Time    04/14/14 12:19 AM      Result Value Ref Range   Pro B Natriuretic peptide (BNP) 412.3  0 - 450 pg/mL    Radiology Reports: Dg Chest Port 1 View  04/14/2014   CLINICAL DATA:  Subacute onset of moderate shortness of breath for 1 month, worsened tonight. Initial encounter. Current history of smoking.  EXAM: PORTABLE CHEST - 1 VIEW  COMPARISON:  Chest radiograph performed 05/02/2012  FINDINGS: The lungs are hyperexpanded, with flattening of the hemidiaphragms, compatible with COPD. Vascular congestion is noted, with increased interstitial markings and small bilateral pleural effusions, raising concern for mild pulmonary edema. No pneumothorax is seen.  The cardiomediastinal silhouette is borderline normal in size. No acute osseous abnormalities are seen.  IMPRESSION: 1. Vascular congestion noted, with increased interstitial markings and small bilateral pleural effusions, concerning for mild pulmonary edema. 2. Findings of COPD.   Electronically Signed   By: Garald Balding M.D.   On: 04/14/2014 00:03    Electrocardiogram: Atrial fibrillation at 89 beats per minute. Normal axis. Intervals are normal. Nonspecific T wave, changes. No concerning ST changes.  Problem List  Principal Problem:   Acute respiratory failure with  hypoxia Active Problems:   TOBACCO ABUSE   FIBRILLATION, ATRIAL   Peripheral vascular disease   COPD (chronic obstructive pulmonary disease)   Pulmonary edema   Acute exacerbation of chronic obstructive pulmonary disease (COPD)   Assessment: This is a 79 year old, Caucasian female, who presents with shortness of breath ongoing for about a month, but worse in the last few days.  She was still wheezing quite profusely when she was brought into the emergency department. Chest X-ray shows possibility of mild pulmonary edema. This appears to be mostly COPD exacerbation, but there could be an element of pulmonary edema considering that she has lower extremity edema.  Plan: #1 acute respiratory failure with hypoxia: Improved with nebulizer treatments. Continue with oxygen for now.  #2 possible acute COPD exacerbation: Continue with the nebulizer treatments, steroids, and antibiotics. Smoking cessation counseling was provided. She blames nicotine patch for her previous episode of syncope. So, we will not prescribe it for now.  #3 Possible mild pulmonary edema: She'll be given a dose of Lasix. Echocardiogram will be ordered. Further doses of Lasix to be determined by rounding MDs. Strict ins and outs and daily weights. Monitor electrolytes. Monitor renal function. Cycle cardiac enzymes  #4 history of atrial fibrillation on anticoagulation: Continue with her current medications. Monitor on telemetry.  #5 history of peripheral vascular disease: Stable. Continue with her home medications.   DVT Prophylaxis: She is on full anticoagulation Code Status: Full code Family Communication: Discussed with the patient and her daughters  Disposition Plan: Admit to telemetry   Further management decisions will depend on results of further testing and patient's response to treatment.   [facility]  [facility] Pager [phone]  If 7PM-7AM, please contact night-coverage [email or web address] Password [email or web address]  04/14/2014, 1:53 AM

## 2014-04-15 DIAGNOSIS — R799 Abnormal finding of blood chemistry, unspecified: Secondary | ICD-10-CM

## 2014-04-15 DIAGNOSIS — I5031 Acute diastolic (congestive) heart failure: Secondary | ICD-10-CM

## 2014-04-15 DIAGNOSIS — J810 Acute pulmonary edema: Secondary | ICD-10-CM

## 2014-04-15 DIAGNOSIS — I369 Nonrheumatic tricuspid valve disorder, unspecified: Secondary | ICD-10-CM

## 2014-04-15 DIAGNOSIS — R0609 Other forms of dyspnea: Secondary | ICD-10-CM

## 2014-04-15 LAB — COMPREHENSIVE METABOLIC PANEL
ALBUMIN: 3.5 g/dL (ref 3.5–5.2)
ALT: 14 U/L (ref 0–35)
AST: 18 U/L (ref 0–37)
Alkaline Phosphatase: 71 U/L (ref 39–117)
Anion gap: 6 (ref 5–15)
BUN: 12 mg/dL (ref 6–23)
CALCIUM: 9 mg/dL (ref 8.4–10.5)
CO2: 36 mEq/L — ABNORMAL HIGH (ref 19–32)
CREATININE: 0.5 mg/dL (ref 0.50–1.10)
Chloride: 89 mEq/L — ABNORMAL LOW (ref 96–112)
GFR calc Af Amer: >90 mL/min (ref >90)
GFR calc non Af Amer: >90 mL/min (ref >90)
Glucose, Bld: 136 mg/dL — ABNORMAL HIGH (ref 70–99)
Potassium: 4.3 mEq/L (ref 3.7–5.3)
SODIUM: 131 meq/L — AB (ref 137–147)
Total Bilirubin: 0.5 mg/dL (ref 0.3–1.2)
Total Protein: 6.1 g/dL (ref 6.0–8.3)

## 2014-04-15 LAB — CBC
HEMATOCRIT: 39.8 % (ref 36.0–46.0)
Hemoglobin: 12.8 g/dL (ref 12.0–15.0)
MCH: 25.2 pg — ABNORMAL LOW (ref 26.0–34.0)
MCHC: 32.2 g/dL (ref 30.0–36.0)
MCV: 78.5 fL (ref 78.0–100.0)
Platelets: 158 10*3/uL (ref 150–400)
RBC: 5.07 MIL/uL (ref 3.87–5.11)
RDW: 14.7 % (ref 11.5–15.5)
WBC: 4 10*3/uL (ref 4.0–10.5)

## 2014-04-15 MED ORDER — SALINE SPRAY 0.65 % NA SOLN: 1.0000 | NASAL | Status: DC | PRN

## 2014-04-15 MED ORDER — BENZONATATE 100 MG PO CAPS
200.0000 mg | ORAL_CAPSULE | Freq: Three times a day (TID) | ORAL | Status: DC
  Administered 2014-04-15 – 2014-04-18 (×11): 200 mg via ORAL
  Filled 2014-04-15 (×11): qty 2

## 2014-04-15 MED ORDER — FUROSEMIDE 10 MG/ML IJ SOLN
20.0000 mg | Freq: Two times a day (BID) | INTRAMUSCULAR | Status: DC
  Administered 2014-04-15 – 2014-04-18 (×7): 20 mg via INTRAVENOUS
  Filled 2014-04-15 (×7): qty 2

## 2014-04-15 MED ORDER — LEVOFLOXACIN 500 MG PO TABS
500.0000 mg | ORAL_TABLET | Freq: Every day | ORAL | Status: DC
  Administered 2014-04-16 – 2014-04-18 (×3): 500 mg via ORAL
  Filled 2014-04-15 (×3): qty 1

## 2014-04-15 NOTE — Clinical Documentation Improvement (Signed)
  Documentation 10/8:"Probable mild pulmonary edema  -Noted on chest x-ray: Vascular congestion, increased interstitial markings, small bilateral pleural effusions, concerning for mild pulmonary edema  -Echocardiogram pending  -Patient was given Lasix at admission, will continue low-dose  -Will monitor her intake and output as well as daily weights"  In the Coding world "mild pulmonary edema is considered nonspecific and is low weighted. Please assign an acuity to increase specificity and reflect severity of illness and risk of mortality.

## 2014-04-15 NOTE — Care Management Note (Signed)
    Page 1 of 2   04/18/2014     1:58:40 PM CARE MANAGEMENT NOTE 04/18/2014  Patient:  Caroline [name], Caroline [name]   Account Number:  [id number]  Date Initiated:  04/15/2014  Documentation initiated by:  Vladimir Creeks  Subjective/Objective Assessment:   Admitted with COPD. Pt is from home with spouse, and will return home at D/C. She may also have a cmponent of CHF.     Action/Plan:   pt will benefit from [facility] and agrees to this. [facility] and this is set up   Anticipated DC Date:  04/18/2014   Anticipated DC Plan:  [place]  CM consult      [facility] Choice  HOME HEALTH   Choice offered to / List presented to:  C-1 Patient   DME arranged  [facility]      DME agency  [place] arranged  HH-1 RN      [place].   Status of service:  Completed, signed off Medicare Important Message given?  YES (If response is "NO", the following Medicare IM given date fields will be blank) Date Medicare IM given:  04/15/2014 Medicare IM given by:  Vladimir Creeks Date Additional Medicare IM given:  04/18/2014 Additional Medicare IM given by:  Vladimir Creeks  Discharge Disposition:  [place]  Per UR Regulation:  Reviewed for med. necessity/level of care/duration of stay  If discussed at [place] of Stay Meetings, dates discussed:    Comments:  04/18/14 [place] RN/CM Pt did not want O2 at home because spouse smokes, still but needs O2 - Sat 82% without O2, and she is willing to do this. States she understands the need for the O2 04/15/14 1700 Vladimir Creeks RN/CM

## 2014-04-15 NOTE — Progress Notes (Signed)
  Echocardiogram 2D Echocardiogram has been performed.  [place], [place] 04/15/2014, 9:21 AM

## 2014-04-15 NOTE — Progress Notes (Signed)
[facility]                                                                              Patient Demographics  Caroline [name], is a 79 y.o. female, DOB - Dec 25, 1934, [id number]  Admit date - 04/13/2014   Admitting Physician Bonnielee Haff, MD  Outpatient Primary MD for the patient is Glo Herring., MD  LOS - 2   Chief Complaint  Patient presents with  . Shortness of Breath      HPI on 04/14/2014  Caroline [name] is a 79 y.o. female with a past medical history of COPD, atrial fibrillation on anticoagulation, peripheral arterial disease who has been short of breath for about a month. However, this has gotten worse in the last few days. She also reported symptoms suggestive of orthopnea. Mainly the shortness of breath has been with exertion. She has also noticed leg swelling for the last few days. She has had a cough with clear expectoration. Denied any blood in the sputum. She has had wheezing as well. Denied any nausea, vomiting. Denied chest pain. No fever. No chills. Has been dizzy at times, but denied any syncopal episode. Denied being on home oxygen. Last night the shortness of breath got worse. When EMS arrived at home her saturation was 80%. She was placed on oxygen and was brought into the emergency department. She was wheezing quite profusely and was given nebulizer treatments, after which patient has shown some improvement. But with ambulation in the ED, her saturations dropped again.  Assessment & Plan   Acute respiratory failure with hypoxia/acute COPD exacerbation  -Patient requires nasal cannula, however does not use this at home  -Possibly secondary to tobacco abuse  -Continue nebulizer treatments, supplemental oxygen, steroids, Levaquin -Will add on tessalon pearls for cough  Possible Acute Diastolic Heart failure with pulmonary edema/pleural effusions -Noted on chest x-ray: Vascular congestion, increased interstitial markings, small bilateral pleural effusions,  concerning for mild pulmonary edema  -BNP elevated at 412 at admission -Echocardiogram: EF 48-25%, diastolic function could not be fully assessed due to Atrial fibrillation -Continue lasix and monitor intake and output as well as daily weights  -Troponins were cycled and found to be negative.   Atrial fibrillation on anticoagulation  -Continue Pradaxa   Peripheral vascular disease  -Continue Pletal   Severe malnutrition in the context of chronic illness  -Continue oral feeding supplement.   Tobacco abuse  -Counseled on smoking cessation  -Patient refuses nicotine patch, as it made her syncopal in the past.   Code Status: Full   Family Communication: Family at bedside   Disposition Plan: Admitted   Time Spent in minutes 30 minutes   Procedures  Echocardiogram  Study Conclusions - Procedure narrative: Transthoracic echocardiography. Image quality was suboptimal. The study was technically difficult, as a result of poor acoustic windows and poor sound wave transmission.  - Left ventricle: The cavity size was normal. Wall thickness was normal. Systolic function was normal. The estimated ejection fraction was in the range of 60% to 65%. Wall motion was normal; there were no regional wall motion abnormalities. The study was not technically sufficient to allow evaluation of LV diastolic dysfunction due to atrial  fibrillation. Doppler parameters are consistent with elevated mean left atrial filling pressure. - Aortic valve: Mildly calcified annulus. Mildly thickened, moderately calcified leaflets. Peak velocity and mean gradient likely underestimated. There appears to be at least a mild degree of aortic stenosis from a morphologic standpoint. - Mitral valve: Mildly calcified annulus. There was trivial regurgitation. - Right atrium: The atrium was mildly dilated. - Tricuspid valve: There was mild regurgitation. - Pulmonary arteries: PA peak pressure: 50 mm Hg (S). Moderately elevated  pulmonary pressures. - Inferior vena cava: The vessel was dilated. The respirophasic diameter changes were blunted (< 50%), consistent with elevated central venous pressure.  Consults  None   DVT Prophylaxis Pradaxa   Lab Results  Component Value Date   PLT 158 04/15/2014    Medications  Scheduled Meds: . benzonatate  200 mg Oral TID  . cilostazol  50 mg Oral BID  . dabigatran  150 mg Oral Q12H  . feeding supplement (ENSURE COMPLETE)  237 mL Oral BID BM  . furosemide  20 mg Intravenous BID  . ipratropium  0.5 mg Nebulization Q4H  . levalbuterol  0.63 mg Nebulization Q4H  . [START ON 04/16/2014] levofloxacin  500 mg Oral QAC breakfast  . methylPREDNISolone (SOLU-MEDROL) injection  60 mg Intravenous Q6H  . potassium chloride SA  20 mEq Oral BID  . sodium chloride  3 mL Intravenous Q12H  . sodium chloride  3 mL Intravenous Q12H  . verapamil  240 mg Oral QAC lunch   Continuous Infusions:  PRN Meds:.sodium chloride, acetaminophen, acetaminophen, levalbuterol, ondansetron (ZOFRAN) IV, ondansetron, sodium chloride, sodium chloride  Antibiotics    Anti-infectives   Start     Dose/Rate Route Frequency Ordered Stop   04/16/14 0800  levofloxacin (LEVAQUIN) tablet 500 mg     500 mg Oral Daily before breakfast 04/15/14 1259     04/14/14 0300  levofloxacin (LEVAQUIN) IVPB 500 mg  Status:  Discontinued     500 mg 100 mL/hr over 60 Minutes Intravenous Every 24 hours 04/14/14 0250 04/15/14 1259      Subjective:   Caroline [name] seen and examined today.  Patient continues to feel short of breath.  She also complains of dry cough.  She denies chest pain or abdominal pain at this time.   Objective:   Filed Vitals:   04/14/14 2323 04/15/14 0615 04/15/14 0732 04/15/14 1255  BP:  111/56    Pulse:  91    Temp:  98.4 F (36.9 C)    TempSrc:  Oral    Resp:  20    Height:      Weight:  50.349 kg (111 lb)    SpO2: 89% 95% 93% 89%    Wt Readings from Last 3 Encounters:  04/15/14  50.349 kg (111 lb)  06/24/13 49.896 kg (110 lb)  11/04/12 51.71 kg (114 lb)     Intake/Output Summary (Last 24 hours) at 04/15/14 1301 Last data filed at 04/15/14 1154  Gross per 24 hour  Intake    120 ml  Output    400 ml  Net   -280 ml    Exam  General: Well developed, well nourished, NAD, appears stated age  HEENT: NCAT,  mucous membranes moist.   Cardiovascular: S1 S2 auscultated, no rubs, murmurs or gallops. Regular rate and rhythm.  Respiratory: Clear to auscultation bilaterally with equal chest rise  Abdomen: Soft, nontender, nondistended, + bowel sounds  Extremities: warm dry without cyanosis clubbing or edema  Neuro: AAOx3, no focal deficits  Psych: Normal affect and demeanor with intact judgement and insight  Data Review   Micro Results No results found for this or any previous visit (from the past 240 hour(s)).  Radiology Reports Dg Chest Port 1 View  04/14/2014   CLINICAL DATA:  Subacute onset of moderate shortness of breath for 1 month, worsened tonight. Initial encounter. Current history of smoking.  EXAM: PORTABLE CHEST - 1 VIEW  COMPARISON:  Chest radiograph performed 05/02/2012  FINDINGS: The lungs are hyperexpanded, with flattening of the hemidiaphragms, compatible with COPD. Vascular congestion is noted, with increased interstitial markings and small bilateral pleural effusions, raising concern for mild pulmonary edema. No pneumothorax is seen.  The cardiomediastinal silhouette is borderline normal in size. No acute osseous abnormalities are seen.  IMPRESSION: 1. Vascular congestion noted, with increased interstitial markings and small bilateral pleural effusions, concerning for mild pulmonary edema. 2. Findings of COPD.   Electronically Signed   By: Garald Balding M.D.   On: 04/14/2014 00:03    CBC  Recent Labs Lab 04/14/14 0019 04/15/14 0600  WBC 6.2 4.0  HGB 14.2 12.8  HCT 42.7 39.8  PLT 156 158  MCV 79.1 78.5  MCH 26.3 25.2*  MCHC 33.3 32.2   RDW 14.8 14.7  LYMPHSABS 1.6  --   MONOABS 0.7  --   EOSABS 0.6  --   BASOSABS 0.1  --     Chemistries   Recent Labs Lab 04/14/14 0019 04/15/14 0600  NA 133* 131*  K 4.3 4.3  CL 89* 89*  CO2 35* 36*  GLUCOSE 110* 136*  BUN 10 12  CREATININE 0.66 0.50  CALCIUM 9.1 9.0  AST  --  18  ALT  --  14  ALKPHOS  --  71  BILITOT  --  0.5   ------------------------------------------------------------------------------------------------------------------ estimated creatinine clearance is 46.8 ml/min (by C-G formula based on Cr of 0.5). ------------------------------------------------------------------------------------------------------------------ No results found for this basename: HGBA1C,  in the last 72 hours ------------------------------------------------------------------------------------------------------------------ No results found for this basename: CHOL, HDL, LDLCALC, TRIG, CHOLHDL, LDLDIRECT,  in the last 72 hours ------------------------------------------------------------------------------------------------------------------  Recent Labs  04/14/14 0341  TSH 2.440   ------------------------------------------------------------------------------------------------------------------ No results found for this basename: VITAMINB12, FOLATE, FERRITIN, TIBC, IRON, RETICCTPCT,  in the last 72 hours  Coagulation profile No results found for this basename: INR, PROTIME,  in the last 168 hours  No results found for this basename: DDIMER,  in the last 72 hours  Cardiac Enzymes  Recent Labs Lab 04/14/14 0341 04/14/14 0856 04/14/14 1502  TROPONINI <0.30 <0.30 <0.30   ------------------------------------------------------------------------------------------------------------------ No components found with this basename: POCBNP,     Caroline [name] D.O. on 04/15/2014 at 1:01 PM  Between 7am to 7pm - Pager - [phone]  After 7pm go to [email or web address] - password  [email or web address]  And look for the night coverage person covering for me after hours  [facility] Office  [phone]

## 2014-04-15 NOTE — Progress Notes (Signed)
Patient and family requested for Pt to sleep. Neb held, daughter will call.

## 2014-04-15 NOTE — Progress Notes (Deleted)
Nurse in room with pt. Pt c/o SOB and breathing is labored. RT notified for Breathing Treatment. Sat pt up straight in ed. Dr notified. VSS. Dr ordered STAT CXR.  Pt feels better after breathing treatment. Pt in bed at lowest position & call bell within reach. WIll monitor pt frequently throughout night.

## 2014-04-15 NOTE — Progress Notes (Signed)
PHARMACIST - PHYSICIAN COMMUNICATION DR:   Ree Kida CONCERNING: Antibiotic IV to Oral Route Change Policy  RECOMMENDATION: This patient is receiving Levaquin by the intravenous route.  Based on criteria approved by the Pharmacy and Therapeutics Committee, the antibiotic(s) is/are being converted to the equivalent oral dose form(s).   DESCRIPTION: These criteria include:  Patient being treated for a respiratory tract infection, urinary tract infection, cellulitis or clostridium difficile associated diarrhea if on metronidazole  The patient is not neutropenic and does not exhibit a GI malabsorption state  The patient is eating (either orally or via tube) and/or has been taking other orally administered medications for a least 24 hours  The patient is improving clinically and has a Tmax < 100.5  If you have questions about this conversion, please contact the Pharmacy Department  [x]   [phone] )  [facility] []   [phone] )  [facility]  []   [phone] )  [facility] []   [phone] )  [place], PharmD, BCPS 04/15/2014@12 :58 PM

## 2014-04-16 ENCOUNTER — Inpatient Hospital Stay (HOSPITAL_COMMUNITY): Payer: Medicare Other

## 2014-04-16 LAB — CBC
HEMATOCRIT: 41.2 % (ref 36.0–46.0)
Hemoglobin: 13.5 g/dL (ref 12.0–15.0)
MCH: 25.9 pg — AB (ref 26.0–34.0)
MCHC: 32.8 g/dL (ref 30.0–36.0)
MCV: 78.9 fL (ref 78.0–100.0)
Platelets: 177 10*3/uL (ref 150–400)
RBC: 5.22 MIL/uL — ABNORMAL HIGH (ref 3.87–5.11)
RDW: 15 % (ref 11.5–15.5)
WBC: 5.7 10*3/uL (ref 4.0–10.5)

## 2014-04-16 LAB — BASIC METABOLIC PANEL
Anion gap: 6 (ref 5–15)
BUN: 20 mg/dL (ref 6–23)
CALCIUM: 9.2 mg/dL (ref 8.4–10.5)
CO2: 37 mEq/L — ABNORMAL HIGH (ref 19–32)
Chloride: 90 mEq/L — ABNORMAL LOW (ref 96–112)
Creatinine, Ser: 0.57 mg/dL (ref 0.50–1.10)
GFR calc Af Amer: >90 mL/min (ref >90)
GFR, EST NON AFRICAN AMERICAN: 87 mL/min — AB (ref >90)
Glucose, Bld: 141 mg/dL — ABNORMAL HIGH (ref 70–99)
Potassium: 4.5 mEq/L (ref 3.7–5.3)
Sodium: 133 mEq/L — ABNORMAL LOW (ref 137–147)

## 2014-04-16 MED ORDER — IPRATROPIUM BROMIDE 0.02 % IN SOLN
0.5000 mg | RESPIRATORY_TRACT | Status: DC
  Administered 2014-04-16 – 2014-04-18 (×10): 0.5 mg via RESPIRATORY_TRACT
  Filled 2014-04-16 (×11): qty 2.5

## 2014-04-16 MED ORDER — LEVALBUTEROL HCL 0.63 MG/3ML IN NEBU
0.6300 mg | INHALATION_SOLUTION | RESPIRATORY_TRACT | Status: DC
  Administered 2014-04-16 – 2014-04-18 (×10): 0.63 mg via RESPIRATORY_TRACT
  Filled 2014-04-16 (×11): qty 3

## 2014-04-16 MED ORDER — PREDNISONE 20 MG PO TABS
60.0000 mg | ORAL_TABLET | Freq: Every day | ORAL | Status: DC
  Administered 2014-04-17: 60 mg via ORAL
  Filled 2014-04-16: qty 3

## 2014-04-16 NOTE — Progress Notes (Signed)
Decreasing patients nebs to while awake Q4

## 2014-04-16 NOTE — Progress Notes (Signed)
[facility]                                                                              Patient Demographics  Caroline [name], is a 79 y.o. female, DOB - April 18, 1935, [id number]  Admit date - 04/13/2014   Admitting Physician Bonnielee Haff, MD  Outpatient Primary MD for the patient is Glo Herring., MD  LOS - 3   Chief Complaint  Patient presents with  . Shortness of Breath      HPI on 04/14/2014  Caroline [name] is a 79 y.o. female with a past medical history of COPD, atrial fibrillation on anticoagulation, peripheral arterial disease who has been short of breath for about a month. However, this has gotten worse in the last few days. She also reported symptoms suggestive of orthopnea. Mainly the shortness of breath has been with exertion. She has also noticed leg swelling for the last few days. She has had a cough with clear expectoration. Denied any blood in the sputum. She has had wheezing as well. Denied any nausea, vomiting. Denied chest pain. No fever. No chills. Has been dizzy at times, but denied any syncopal episode. Denied being on home oxygen. Last night the shortness of breath got worse. When EMS arrived at home her saturation was 80%. She was placed on oxygen and was brought into the emergency department. She was wheezing quite profusely and was given nebulizer treatments, after which patient has shown some improvement. But with ambulation in the ED, her saturations dropped again.  Assessment & Plan   Acute respiratory failure with hypoxia/acute COPD exacerbation  -Patient requires nasal cannula, however does not use this at home  -Possibly secondary to tobacco abuse  -Continue nebulizer treatments, supplemental oxygen, steroids, Levaquin -Will add on tessalon pearls for cough -Start steroid titration.  Possible Acute Diastolic Heart failure with pulmonary edema/pleural effusions -Noted on chest x-ray: Vascular congestion, increased interstitial markings, small  bilateral pleural effusions, concerning for mild pulmonary edema  -BNP elevated at 412 at admission -Echocardiogram: EF 61-44%, diastolic function could not be fully assessed due to Atrial fibrillation -Continue lasix and monitor intake and output as well as daily weights  -Troponins were cycled and found to be negative.   Atrial fibrillation on anticoagulation  -Continue Pradaxa   Peripheral vascular disease  -Continue Pletal   Severe malnutrition in the context of chronic illness  -Continue oral feeding supplement.   Tobacco abuse  -Counseled on smoking cessation  -Patient refuses nicotine patch, as it made her syncopal in the past.   Code Status: Full   Family Communication: Family at bedside   Disposition Plan: Admitted   Time Spent in minutes 30 minutes   Procedures  Echocardiogram  Study Conclusions - Procedure narrative: Transthoracic echocardiography. Image quality was suboptimal. The study was technically difficult, as a result of poor acoustic windows and poor sound wave transmission.  - Left ventricle: The cavity size was normal. Wall thickness was normal. Systolic function was normal. The estimated ejection fraction was in the range of 60% to 65%. Wall motion was normal; there were no regional wall motion abnormalities. The study was not technically sufficient to allow evaluation of LV diastolic dysfunction  due to atrial fibrillation. Doppler parameters are consistent with elevated mean left atrial filling pressure. - Aortic valve: Mildly calcified annulus. Mildly thickened, moderately calcified leaflets. Peak velocity and mean gradient likely underestimated. There appears to be at least a mild degree of aortic stenosis from a morphologic standpoint. - Mitral valve: Mildly calcified annulus. There was trivial regurgitation. - Right atrium: The atrium was mildly dilated. - Tricuspid valve: There was mild regurgitation. - Pulmonary arteries: PA peak pressure: 50 mm Hg  (S). Moderately elevated pulmonary pressures. - Inferior vena cava: The vessel was dilated. The respirophasic diameter changes were blunted (< 50%), consistent with elevated central venous pressure.  Consults  None   DVT Prophylaxis Pradaxa   Lab Results  Component Value Date   PLT 177 04/16/2014    Medications  Scheduled Meds: . benzonatate  200 mg Oral TID  . cilostazol  50 mg Oral BID  . dabigatran  150 mg Oral Q12H  . feeding supplement (ENSURE COMPLETE)  237 mL Oral BID BM  . furosemide  20 mg Intravenous BID  . ipratropium  0.5 mg Nebulization Q4H WA  . levalbuterol  0.63 mg Nebulization Q4H WA  . levofloxacin  500 mg Oral QAC breakfast  . methylPREDNISolone (SOLU-MEDROL) injection  60 mg Intravenous Q6H  . potassium chloride SA  20 mEq Oral BID  . sodium chloride  3 mL Intravenous Q12H  . sodium chloride  3 mL Intravenous Q12H  . verapamil  240 mg Oral QAC lunch   Continuous Infusions:  PRN Meds:.sodium chloride, acetaminophen, acetaminophen, levalbuterol, ondansetron (ZOFRAN) IV, ondansetron, sodium chloride, sodium chloride  Antibiotics    Anti-infectives   Start     Dose/Rate Route Frequency Ordered Stop   04/16/14 0800  levofloxacin (LEVAQUIN) tablet 500 mg     500 mg Oral Daily before breakfast 04/15/14 1259     04/14/14 0300  levofloxacin (LEVAQUIN) IVPB 500 mg  Status:  Discontinued     500 mg 100 mL/hr over 60 Minutes Intravenous Every 24 hours 04/14/14 0250 04/15/14 1259      Subjective:   Caroline [name] seen and examined today.  Patient continues to feel short of breath.  She also complains of dry cough.  She denies chest pain or abdominal pain at this time.   Objective:   Filed Vitals:   04/16/14 0509 04/16/14 0810 04/16/14 1343 04/16/14 1530  BP: 136/69   120/58  Pulse: 95     Temp: 97.8 F (36.6 C)   98.3 F (36.8 C)  TempSrc: Oral   Oral  Resp: 18   18  Height:      Weight: 50.6 kg (111 lb 8.8 oz)     SpO2: 94% 91% 94%     Wt  Readings from Last 3 Encounters:  04/16/14 50.6 kg (111 lb 8.8 oz)  06/24/13 49.896 kg (110 lb)  11/04/12 51.71 kg (114 lb)     Intake/Output Summary (Last 24 hours) at 04/16/14 1719 Last data filed at 04/16/14 1200  Gross per 24 hour  Intake    720 ml  Output    400 ml  Net    320 ml    Exam  General: Well developed, well nourished, NAD, appears stated age  HEENT: NCAT,  mucous membranes moist.   Cardiovascular: S1 S2 auscultated, no rubs, murmurs or gallops. Regular rate and rhythm.  Respiratory: Clear to auscultation bilaterally with equal chest rise  Abdomen: Soft, nontender, nondistended, + bowel sounds  Extremities: warm dry without  cyanosis clubbing or edema  Neuro: AAOx3, no focal deficits  Psych: Normal affect and demeanor with intact judgement and insight  Data Review   Micro Results No results found for this or any previous visit (from the past 240 hour(s)).  Radiology Reports Dg Chest Port 1 View  04/14/2014   CLINICAL DATA:  Subacute onset of moderate shortness of breath for 1 month, worsened tonight. Initial encounter. Current history of smoking.  EXAM: PORTABLE CHEST - 1 VIEW  COMPARISON:  Chest radiograph performed 05/02/2012  FINDINGS: The lungs are hyperexpanded, with flattening of the hemidiaphragms, compatible with COPD. Vascular congestion is noted, with increased interstitial markings and small bilateral pleural effusions, raising concern for mild pulmonary edema. No pneumothorax is seen.  The cardiomediastinal silhouette is borderline normal in size. No acute osseous abnormalities are seen.  IMPRESSION: 1. Vascular congestion noted, with increased interstitial markings and small bilateral pleural effusions, concerning for mild pulmonary edema. 2. Findings of COPD.   Electronically Signed   By: Garald Balding M.D.   On: 04/14/2014 00:03    CBC  Recent Labs Lab 04/14/14 0019 04/15/14 0600 04/16/14 0648  WBC 6.2 4.0 5.7  HGB 14.2 12.8 13.5  HCT  42.7 39.8 41.2  PLT 156 158 177  MCV 79.1 78.5 78.9  MCH 26.3 25.2* 25.9*  MCHC 33.3 32.2 32.8  RDW 14.8 14.7 15.0  LYMPHSABS 1.6  --   --   MONOABS 0.7  --   --   EOSABS 0.6  --   --   BASOSABS 0.1  --   --     Chemistries   Recent Labs Lab 04/14/14 0019 04/15/14 0600 04/16/14 0648  NA 133* 131* 133*  K 4.3 4.3 4.5  CL 89* 89* 90*  CO2 35* 36* 37*  GLUCOSE 110* 136* 141*  BUN 10 12 20   CREATININE 0.66 0.50 0.57  CALCIUM 9.1 9.0 9.2  AST  --  18  --   ALT  --  14  --   ALKPHOS  --  71  --   BILITOT  --  0.5  --    ------------------------------------------------------------------------------------------------------------------ estimated creatinine clearance is 47 ml/min (by C-G formula based on Cr of 0.57). ------------------------------------------------------------------------------------------------------------------ No results found for this basename: HGBA1C,  in the last 72 hours ------------------------------------------------------------------------------------------------------------------ No results found for this basename: CHOL, HDL, LDLCALC, TRIG, CHOLHDL, LDLDIRECT,  in the last 72 hours ------------------------------------------------------------------------------------------------------------------  Recent Labs  04/14/14 0341  TSH 2.440   ------------------------------------------------------------------------------------------------------------------ No results found for this basename: VITAMINB12, FOLATE, FERRITIN, TIBC, IRON, RETICCTPCT,  in the last 72 hours  Coagulation profile No results found for this basename: INR, PROTIME,  in the last 168 hours  No results found for this basename: DDIMER,  in the last 72 hours  Cardiac Enzymes  Recent Labs Lab 04/14/14 0341 04/14/14 0856 04/14/14 1502  TROPONINI <0.30 <0.30 <0.30   ------------------------------------------------------------------------------------------------------------------ No  components found with this basename: POCBNP,     HERNANDEZ ACOSTA,ESTELA MD. on 04/16/2014 at 5:19 PM  Between 7am to 7pm - Pager - [phone]  After 7pm go to [email or web address] - password [email or web address]  And look for the night coverage person covering for me after hours  [facility] Office  [phone]

## 2014-04-17 LAB — BASIC METABOLIC PANEL
ANION GAP: 4 — AB (ref 5–15)
BUN: 23 mg/dL (ref 6–23)
CHLORIDE: 89 meq/L — AB (ref 96–112)
CO2: 41 mEq/L (ref 19–32)
Calcium: 9.2 mg/dL (ref 8.4–10.5)
Creatinine, Ser: 0.59 mg/dL (ref 0.50–1.10)
GFR calc Af Amer: >90 mL/min (ref >90)
GFR calc non Af Amer: 86 mL/min — ABNORMAL LOW (ref >90)
Glucose, Bld: 127 mg/dL — ABNORMAL HIGH (ref 70–99)
POTASSIUM: 5.3 meq/L (ref 3.7–5.3)
SODIUM: 134 meq/L — AB (ref 137–147)

## 2014-04-17 LAB — CBC
HCT: 40.9 % (ref 36.0–46.0)
HEMOGLOBIN: 13.7 g/dL (ref 12.0–15.0)
MCH: 26.7 pg (ref 26.0–34.0)
MCHC: 33.5 g/dL (ref 30.0–36.0)
MCV: 79.6 fL (ref 78.0–100.0)
PLATELETS: 177 10*3/uL (ref 150–400)
RBC: 5.14 MIL/uL — ABNORMAL HIGH (ref 3.87–5.11)
RDW: 14.8 % (ref 11.5–15.5)
WBC: 7.1 10*3/uL (ref 4.0–10.5)

## 2014-04-17 MED ORDER — PREDNISONE 20 MG PO TABS
50.0000 mg | ORAL_TABLET | Freq: Every day | ORAL | Status: DC
  Administered 2014-04-18: 50 mg via ORAL
  Filled 2014-04-17: qty 1
  Filled 2014-04-17: qty 2

## 2014-04-17 MED ORDER — ALPRAZOLAM 0.5 MG PO TABS
0.5000 mg | ORAL_TABLET | Freq: Once | ORAL | Status: AC
  Administered 2014-04-17: 0.5 mg via ORAL
  Filled 2014-04-17: qty 1

## 2014-04-17 NOTE — Progress Notes (Signed)
[facility]                                                                              Patient Demographics  Caroline [name], is a 79 y.o. female, DOB - 12-Mar-1935, FAO:[id number]  Admit date - 04/13/2014   Admitting Physician Bonnielee Haff, MD  Outpatient Primary MD for the patient is Glo Herring., MD  LOS - 4   Chief Complaint  Patient presents with  . Shortness of Breath      HPI on 04/14/2014  Caroline [name] is a 79 y.o. female with a past medical history of COPD, atrial fibrillation on anticoagulation, peripheral arterial disease who has been short of breath for about a month. However, this has gotten worse in the last few days. She also reported symptoms suggestive of orthopnea. Mainly the shortness of breath has been with exertion. She has also noticed leg swelling for the last few days. She has had a cough with clear expectoration. Denied any blood in the sputum. She has had wheezing as well. Denied any nausea, vomiting. Denied chest pain. No fever. No chills. Has been dizzy at times, but denied any syncopal episode. Denied being on home oxygen. Last night the shortness of breath got worse. When EMS arrived at home her saturation was 80%. She was placed on oxygen and was brought into the emergency department. She was wheezing quite profusely and was given nebulizer treatments, after which patient has shown some improvement. But with ambulation in the ED, her saturations dropped again.  Assessment & Plan   Acute respiratory failure with hypoxia/acute COPD exacerbation  -Patient requires nasal cannula, however does not use this at home  -Possibly secondary to tobacco abuse  -Continue nebulizer treatments, supplemental oxygen, steroids, Levaquin -Will add on tessalon pearls for cough -Start steroid titration. -Wean oxygen as tolerated.  Possible Acute Diastolic Heart failure with pulmonary edema/pleural effusions -Noted on chest x-ray: Vascular congestion, increased  interstitial markings, small bilateral pleural effusions, concerning for mild pulmonary edema  -BNP elevated at 412 at admission -Echocardiogram: EF 69-62%, diastolic function could not be fully assessed due to Atrial fibrillation -Continue lasix and monitor intake and output as well as daily weights  -Troponins were cycled and found to be negative.   Atrial fibrillation on anticoagulation  -Continue Pradaxa   Peripheral vascular disease  -Continue Pletal   Severe malnutrition in the context of chronic illness  -Continue oral feeding supplement.   Tobacco abuse  -Counseled on smoking cessation  -Patient refuses nicotine patch, as it made her syncopal in the past.   Code Status: Full   Family Communication: Family at bedside   Disposition Plan: Admitted   Time Spent in minutes 30 minutes   Procedures  Echocardiogram  Study Conclusions - Procedure narrative: Transthoracic echocardiography. Image quality was suboptimal. The study was technically difficult, as a result of poor acoustic windows and poor sound wave transmission.  - Left ventricle: The cavity size was normal. Wall thickness was normal. Systolic function was normal. The estimated ejection fraction was in the range of 60% to 65%. Wall motion was normal; there were no regional wall motion abnormalities. The study was not technically sufficient to allow evaluation  of LV diastolic dysfunction due to atrial fibrillation. Doppler parameters are consistent with elevated mean left atrial filling pressure. - Aortic valve: Mildly calcified annulus. Mildly thickened, moderately calcified leaflets. Peak velocity and mean gradient likely underestimated. There appears to be at least a mild degree of aortic stenosis from a morphologic standpoint. - Mitral valve: Mildly calcified annulus. There was trivial regurgitation. - Right atrium: The atrium was mildly dilated. - Tricuspid valve: There was mild regurgitation. - Pulmonary arteries:  PA peak pressure: 50 mm Hg (S). Moderately elevated pulmonary pressures. - Inferior vena cava: The vessel was dilated. The respirophasic diameter changes were blunted (< 50%), consistent with elevated central venous pressure.  Consults  None   DVT Prophylaxis Pradaxa   Lab Results  Component Value Date   PLT 177 04/17/2014    Medications  Scheduled Meds: . benzonatate  200 mg Oral TID  . cilostazol  50 mg Oral BID  . dabigatran  150 mg Oral Q12H  . feeding supplement (ENSURE COMPLETE)  237 mL Oral BID BM  . furosemide  20 mg Intravenous BID  . ipratropium  0.5 mg Nebulization Q4H WA  . levalbuterol  0.63 mg Nebulization Q4H WA  . levofloxacin  500 mg Oral QAC breakfast  . potassium chloride SA  20 mEq Oral BID  . predniSONE  60 mg Oral Q breakfast  . sodium chloride  3 mL Intravenous Q12H  . sodium chloride  3 mL Intravenous Q12H  . verapamil  240 mg Oral QAC lunch   Continuous Infusions:  PRN Meds:.sodium chloride, acetaminophen, acetaminophen, levalbuterol, ondansetron (ZOFRAN) IV, ondansetron, sodium chloride, sodium chloride  Antibiotics    Anti-infectives   Start     Dose/Rate Route Frequency Ordered Stop   04/16/14 0800  levofloxacin (LEVAQUIN) tablet 500 mg     500 mg Oral Daily before breakfast 04/15/14 1259     04/14/14 0300  levofloxacin (LEVAQUIN) IVPB 500 mg  Status:  Discontinued     500 mg 100 mL/hr over 60 Minutes Intravenous Every 24 hours 04/14/14 0250 04/15/14 1259      Subjective:   Caroline [name] seen and examined today.  Patient continues to feel short of breath.  She also complains of dry cough.  She denies chest pain or abdominal pain at this time.   Objective:   Filed Vitals:   04/16/14 2322 04/17/14 0614 04/17/14 0628 04/17/14 1411  BP:  134/87  117/61  Pulse:  99  109  Temp:  97.5 F (36.4 C)  98.2 F (36.8 C)  TempSrc:  Oral  Oral  Resp:  20  20  Height:      Weight:   49.215 kg (108 lb 8 oz)   SpO2: 94% 94%  94%    Wt  Readings from Last 3 Encounters:  04/17/14 49.215 kg (108 lb 8 oz)  06/24/13 49.896 kg (110 lb)  11/04/12 51.71 kg (114 lb)     Intake/Output Summary (Last 24 hours) at 04/17/14 1608 Last data filed at 04/17/14 1229  Gross per 24 hour  Intake    720 ml  Output   3560 ml  Net  -2840 ml    Exam  General: Well developed, well nourished, NAD, appears stated age  HEENT: NCAT,  mucous membranes moist.   Cardiovascular: S1 S2 auscultated, no rubs, murmurs or gallops. Regular rate and rhythm.  Respiratory: Clear to auscultation bilaterally with equal chest rise  Abdomen: Soft, nontender, nondistended, + bowel sounds  Extremities: warm dry without  cyanosis clubbing or edema  Neuro: AAOx3, no focal deficits  Psych: Normal affect and demeanor with intact judgement and insight  Data Review   Micro Results No results found for this or any previous visit (from the past 240 hour(s)).  Radiology Reports Dg Chest Port 1 View  04/14/2014   CLINICAL DATA:  Subacute onset of moderate shortness of breath for 1 month, worsened tonight. Initial encounter. Current history of smoking.  EXAM: PORTABLE CHEST - 1 VIEW  COMPARISON:  Chest radiograph performed 05/02/2012  FINDINGS: The lungs are hyperexpanded, with flattening of the hemidiaphragms, compatible with COPD. Vascular congestion is noted, with increased interstitial markings and small bilateral pleural effusions, raising concern for mild pulmonary edema. No pneumothorax is seen.  The cardiomediastinal silhouette is borderline normal in size. No acute osseous abnormalities are seen.  IMPRESSION: 1. Vascular congestion noted, with increased interstitial markings and small bilateral pleural effusions, concerning for mild pulmonary edema. 2. Findings of COPD.   Electronically Signed   By: Garald Balding M.D.   On: 04/14/2014 00:03    CBC  Recent Labs Lab 04/14/14 0019 04/15/14 0600 04/16/14 0648 04/17/14 0557  WBC 6.2 4.0 5.7 7.1  HGB  14.2 12.8 13.5 13.7  HCT 42.7 39.8 41.2 40.9  PLT 156 158 177 177  MCV 79.1 78.5 78.9 79.6  MCH 26.3 25.2* 25.9* 26.7  MCHC 33.3 32.2 32.8 33.5  RDW 14.8 14.7 15.0 14.8  LYMPHSABS 1.6  --   --   --   MONOABS 0.7  --   --   --   EOSABS 0.6  --   --   --   BASOSABS 0.1  --   --   --     Chemistries   Recent Labs Lab 04/14/14 0019 04/15/14 0600 04/16/14 0648 04/17/14 0557  NA 133* 131* 133* 134*  K 4.3 4.3 4.5 5.3  CL 89* 89* 90* 89*  CO2 35* 36* 37* 41*  GLUCOSE 110* 136* 141* 127*  BUN 10 12 20 23   CREATININE 0.66 0.50 0.57 0.59  CALCIUM 9.1 9.0 9.2 9.2  AST  --  18  --   --   ALT  --  14  --   --   ALKPHOS  --  71  --   --   BILITOT  --  0.5  --   --    ------------------------------------------------------------------------------------------------------------------ estimated creatinine clearance is 45.7 ml/min (by C-G formula based on Cr of 0.59). ------------------------------------------------------------------------------------------------------------------ No results found for this basename: HGBA1C,  in the last 72 hours ------------------------------------------------------------------------------------------------------------------ No results found for this basename: CHOL, HDL, LDLCALC, TRIG, CHOLHDL, LDLDIRECT,  in the last 72 hours ------------------------------------------------------------------------------------------------------------------ No results found for this basename: TSH, T4TOTAL, FREET3, T3FREE, THYROIDAB,  in the last 72 hours ------------------------------------------------------------------------------------------------------------------ No results found for this basename: VITAMINB12, FOLATE, FERRITIN, TIBC, IRON, RETICCTPCT,  in the last 72 hours  Coagulation profile No results found for this basename: INR, PROTIME,  in the last 168 hours  No results found for this basename: DDIMER,  in the last 72 hours  Cardiac Enzymes  Recent Labs Lab  04/14/14 0341 04/14/14 0856 04/14/14 1502  TROPONINI <0.30 <0.30 <0.30   ------------------------------------------------------------------------------------------------------------------ No components found with this basename: POCBNP,     HERNANDEZ ACOSTA,ESTELA MD. on 04/17/2014 at 4:08 PM  Between 7am to 7pm - Pager - [phone]  After 7pm go to [email or web address] - password [email or web address]  And look for the night coverage person covering for me after hours  [facility]  Group Office  [phone]

## 2014-04-17 NOTE — Progress Notes (Signed)
Patient anxious and upset.  On call MD notified.  Xanax order obtained.  MD aware of prior allergy to ativan.  Order carried out. Will continue to monitor.

## 2014-04-17 NOTE — Progress Notes (Signed)
Late entry:  1340 Patient's O2 sats on at rest on RA 85%. Patient's O2 sats on O2 3L [place] 93% while ambulating. Patient's O2 sats on O2 3L [place] 94% at rest after ambulating.

## 2014-04-17 NOTE — Progress Notes (Signed)
CRITICAL VALUE ALERT  Critical value received:  CO2 41  Date of notification:  04/17/2014  Time of notification:  0720  Critical value read back:Yes.    Nurse who received alert:  Gershon Cull, RN  MD notified (1st page):  Dr. Jerilee Hoh  Time of first page:  0728  MD notified (2nd page):  Time of second page:  Responding MD:  Dr. Jerilee Hoh  Time MD responded:  0930 Dr. Jerilee Hoh on unit to see patient.

## 2014-04-18 DIAGNOSIS — J9601 Acute respiratory failure with hypoxia: Secondary | ICD-10-CM | POA: Diagnosis not present

## 2014-04-18 LAB — BASIC METABOLIC PANEL
Anion gap: 7 (ref 5–15)
BUN: 20 mg/dL (ref 6–23)
CO2: 42 mEq/L (ref 19–32)
Calcium: 9 mg/dL (ref 8.4–10.5)
Chloride: 87 mEq/L — ABNORMAL LOW (ref 96–112)
Creatinine, Ser: 0.61 mg/dL (ref 0.50–1.10)
GFR calc Af Amer: >90 mL/min (ref >90)
GFR calc non Af Amer: 85 mL/min — ABNORMAL LOW (ref >90)
GLUCOSE: 84 mg/dL (ref 70–99)
POTASSIUM: 3.8 meq/L (ref 3.7–5.3)
Sodium: 136 mEq/L — ABNORMAL LOW (ref 137–147)

## 2014-04-18 MED ORDER — IPRATROPIUM BROMIDE 0.02 % IN SOLN: 0.5000 mg | RESPIRATORY_TRACT | Status: DC

## 2014-04-18 MED ORDER — POTASSIUM CHLORIDE CRYS ER 20 MEQ PO TBCR: 20.0000 meq | EXTENDED_RELEASE_TABLET | Freq: Two times a day (BID) | ORAL | Status: DC

## 2014-04-18 MED ORDER — BENZONATATE 200 MG PO CAPS: 200.0000 mg | ORAL_CAPSULE | Freq: Three times a day (TID) | ORAL | Status: DC

## 2014-04-18 MED ORDER — PREDNISONE (PAK) 10 MG PO TABS: ORAL_TABLET | Freq: Every day | ORAL | Status: DC

## 2014-04-18 MED ORDER — FLUTICASONE-SALMETEROL 250-50 MCG/DOSE IN AEPB: 1.0000 | INHALATION_SPRAY | Freq: Two times a day (BID) | RESPIRATORY_TRACT | Status: DC

## 2014-04-18 MED ORDER — TIOTROPIUM BROMIDE MONOHYDRATE 18 MCG IN CAPS: 18.0000 ug | ORAL_CAPSULE | Freq: Every day | RESPIRATORY_TRACT | Status: DC

## 2014-04-18 MED ORDER — LEVOFLOXACIN 500 MG PO TABS: 500.0000 mg | ORAL_TABLET | Freq: Every day | ORAL | Status: DC

## 2014-04-18 MED ORDER — INFLUENZA VAC SPLIT QUAD 0.5 ML IM SUSY
0.5000 mL | PREFILLED_SYRINGE | INTRAMUSCULAR | Status: AC
  Administered 2014-04-18: 0.5 mL via INTRAMUSCULAR
  Filled 2014-04-18: qty 0.5

## 2014-04-18 MED ORDER — LEVALBUTEROL HCL 0.63 MG/3ML IN NEBU: 0.6300 mg | INHALATION_SOLUTION | RESPIRATORY_TRACT | Status: DC

## 2014-04-18 NOTE — Care Management Utilization Note (Signed)
UR completed 

## 2014-04-18 NOTE — Progress Notes (Signed)
CRITICAL VALUE ALERT  Critical value received:  CO2  Date of notification:  04/18/14  Time of notification:  1100  Critical value read back:Yes.    Nurse who received alert:  Leroy Kennedy, RN   MD notified (1st page):  Thersa Salt  Time of first page:  1100  MD notified (2nd page):  Time of second page:  Responding MD:   Thersa Salt   Time MD responded:  1100

## 2014-04-18 NOTE — Discharge Summary (Signed)
Physician Discharge Summary  Caroline [name]:[id number] DOB: Jul 22, 1934 DOA: 04/13/2014  PCP: [name] Herring., MD  Admit date: 04/13/2014 Discharge date: 04/18/2014  Time spent: 45 minutes  Recommendations for Outpatient Follow-up:  -Patient will be discharged home today. -Advised to followup with primary care provider and pulmonologist within the next 2 weeks. -Home health oxygen and an RN has been arranged for teaching of her disease process.   Discharge Diagnoses:  Principal Problem:   Acute respiratory failure with hypoxia Active Problems:   TOBACCO ABUSE   FIBRILLATION, ATRIAL   Peripheral vascular disease   COPD (chronic obstructive pulmonary disease)   Pulmonary edema   Acute exacerbation of chronic obstructive pulmonary disease (COPD)   Protein-calorie malnutrition, severe   Discharge Condition: Stable and improved  Filed Weights   04/17/14 0628 04/18/14 0500 04/18/14 0635  Weight: 49.215 kg (108 lb 8 oz) 49.1 kg (108 lb 3.9 oz) 48.217 kg (106 lb 4.8 oz)    History of present illness:  Caroline [name] is a 79 y.o. female with a past medical history of COPD, atrial fibrillation on anticoagulation, peripheral arterial disease who has been short of breath for about a month. However, this has gotten worse in the last few days. She also reports symptoms suggestive of orthopnea. Mainly the shortness of breath has been with exertion. She has also noticed leg swelling for the last few days. She's had a cough with clear expectoration. Denies any blood in the sputum. She's had wheezing as well. Denies any nausea, vomiting. Denies chest pain. No fever. No chills. Has been dizzy at times, but denies any syncopal episode. Denies being on home oxygen. Last night the shortness of breath got worse. When EMS arrived at home her saturation was 80%. She was placed on oxygen and was brought into the emergency department. She was wheezing quite profusely and was given nebulizer  treatments, after which patient has shown some improvement. But with ambulation in the ED, her saturations dropped again.    Hospital Course:   Acute respiratory failure with hypoxia/acute COPD exacerbation  -Possibly secondary to chronic tobacco abuse  -Continue nebulizer treatments, supplemental oxygen, steroids, Levaquin  -Will add on tessalon pearls for cough  -Start steroid titration.  -Will optimize COPD regimen with Advair and as per either as well as when necessary Xopenex. -Have arranged for home oxygen given low oxygen saturations even at rest.   Acute Diastolic Heart failure with pulmonary edema/pleural effusions  -Noted on chest x-ray: Vascular congestion, increased interstitial markings, small bilateral pleural effusions, concerning for mild pulmonary edema  -BNP elevated at 412 at admission  -Echocardiogram: EF 18-84%, diastolic function could not be fully assessed due to Atrial fibrillation  -Continue lasix and monitor intake and output as well as daily weights  -Troponins were cycled and found to be negative.   Atrial fibrillation on anticoagulation  -Continue Pradaxa   Peripheral vascular disease  -Continue Pletal   Severe malnutrition in the context of chronic illness  -Continue oral feeding supplement.   Tobacco abuse  -Counseled on smoking cessation  -Patient refuses nicotine patch, as it made her syncopal in the past.      Procedures:  None   Consultations:  None  Discharge Instructions  Discharge Instructions   Increase activity slowly    Complete by:  As directed             Medication List    STOP taking these medications       hydrochlorothiazide  12.5 MG capsule  Commonly known as:  MICROZIDE      TAKE these medications       albuterol (2.5 MG/3ML) 0.083% nebulizer solution  Commonly known as:  PROVENTIL  Take 2.5 mg by nebulization every 6 (six) hours as needed for wheezing or shortness of breath.     benzonatate 200 MG  capsule  Commonly known as:  TESSALON  Take 1 capsule (200 mg total) by mouth 3 (three) times daily.     cilostazol 100 MG tablet  Commonly known as:  PLETAL  Take 50 mg by mouth 2 (two) times daily.     dabigatran 150 MG Caps capsule  Commonly known as:  PRADAXA  Take 1 capsule (150 mg total) by mouth every 12 (twelve) hours.     Fluticasone-Salmeterol 250-50 MCG/DOSE Aepb  Commonly known as:  ADVAIR DISKUS  Inhale 1 puff into the lungs 2 (two) times daily.     ipratropium 0.02 % nebulizer solution  Commonly known as:  ATROVENT  Take 2.5 mLs (0.5 mg total) by nebulization every 4 (four) hours.     levalbuterol 0.63 MG/3ML nebulizer solution  Commonly known as:  XOPENEX  Take 3 mLs (0.63 mg total) by nebulization every 4 (four) hours.     levofloxacin 500 MG tablet  Commonly known as:  LEVAQUIN  Take 1 tablet (500 mg total) by mouth daily before breakfast.     potassium chloride SA 20 MEQ tablet  Commonly known as:  K-DUR,KLOR-CON  Take 1 tablet (20 mEq total) by mouth 2 (two) times daily.     predniSONE 10 MG tablet  Commonly known as:  STERAPRED UNI-PAK  Take by mouth daily. Take 6 tablets today and decrease by 1 tablet daily until none are left.     tiotropium 18 MCG inhalation capsule  Commonly known as:  SPIRIVA  Place 1 capsule (18 mcg total) into inhaler and inhale daily.     verapamil 240 MG CR tablet  Commonly known as:  CALAN-SR  Take 240 mg by mouth daily. At lunch       Allergies  Allergen Reactions  . Ibuprofen     Gi upset  . Albuterol     Confusion and shortness of breath  . Amoxicillin Hives  . Cephalexin Hives  . Codeine     confusion  . Entex Pac [Pseudoephedrine-Gg & Dm]   . Etanercept     vertigo  . Fluticasone-Salmeterol     Confusion and shortness of breath  . Gualenic Acid   . Lorazepam     hallucinations  . Nubain [Nalbuphine Hcl]   . Oxycodone Hcl Nausea And Vomiting  . Oxycodone-Acetaminophen Nausea And Vomiting  .  Roxicodone [Oxycodone Hcl]   . Tylox [Oxycodone-Acetaminophen]        Follow-up Information   Follow up with [facility]. (They will call you)    Contact information:   [address] [place] [address] [phone]       Follow up with [name] Herring., MD. Schedule an appointment as soon as possible for a visit in 2 weeks.   Specialty:  Internal Medicine   Contact information:   [address] [place] [address] [phone]        The results of significant diagnostics from this hospitalization (including imaging, microbiology, ancillary and laboratory) are listed below for reference.    Significant Diagnostic Studies: Dg Chest Port 1 View  04/16/2014   CLINICAL DATA:  Worsening shortness of breath today.  EXAM: PORTABLE CHEST - 1 VIEW  COMPARISON:  April 13, 2014  FINDINGS: The heart size and mediastinal contours are within normal limits. There is bilateral diffuse increased pulmonary interstitium. The lungs are hyperinflated. There is no focal pneumonia. There is no pleural effusion. The visualized skeletal structures are stable.  IMPRESSION: Stable interstitial edema unchanged compared to April 13, 2014.   Electronically Signed   By: Abelardo Diesel M.D.   On: 04/16/2014 17:20   Dg Chest Port 1 View  04/14/2014   CLINICAL DATA:  Subacute onset of moderate shortness of breath for 1 month, worsened tonight. Initial encounter. Current history of smoking.  EXAM: PORTABLE CHEST - 1 VIEW  COMPARISON:  Chest radiograph performed 05/02/2012  FINDINGS: The lungs are hyperexpanded, with flattening of the hemidiaphragms, compatible with COPD. Vascular congestion is noted, with increased interstitial markings and small bilateral pleural effusions, raising concern for mild pulmonary edema. No pneumothorax is seen.  The cardiomediastinal silhouette is borderline normal in size. No acute osseous abnormalities are seen.  IMPRESSION: 1. Vascular congestion  noted, with increased interstitial markings and small bilateral pleural effusions, concerning for mild pulmonary edema. 2. Findings of COPD.   Electronically Signed   By: Garald Balding M.D.   On: 04/14/2014 00:03    Microbiology: No results found for this or any previous visit (from the past 240 hour(s)).   Labs: Basic Metabolic Panel:  Recent Labs Lab 04/14/14 0019 04/15/14 0600 04/16/14 0648 04/17/14 0557 04/18/14 1006  NA 133* 131* 133* 134* 136*  K 4.3 4.3 4.5 5.3 3.8  CL 89* 89* 90* 89* 87*  CO2 35* 36* 37* 41* 42*  GLUCOSE 110* 136* 141* 127* 84  BUN 10 12 20 23 20   CREATININE 0.66 0.50 0.57 0.59 0.61  CALCIUM 9.1 9.0 9.2 9.2 9.0   Liver Function Tests:  Recent Labs Lab 04/15/14 0600  AST 18  ALT 14  ALKPHOS 71  BILITOT 0.5  PROT 6.1  ALBUMIN 3.5   No results found for this basename: LIPASE, AMYLASE,  in the last 168 hours No results found for this basename: AMMONIA,  in the last 168 hours CBC:  Recent Labs Lab 04/14/14 0019 04/15/14 0600 04/16/14 0648 04/17/14 0557  WBC 6.2 4.0 5.7 7.1  NEUTROABS 3.3  --   --   --   HGB 14.2 12.8 13.5 13.7  HCT 42.7 39.8 41.2 40.9  MCV 79.1 78.5 78.9 79.6  PLT 156 158 177 177   Cardiac Enzymes:  Recent Labs Lab 04/14/14 0019 04/14/14 0341 04/14/14 0856 04/14/14 1502  TROPONINI <0.30 <0.30 <0.30 <0.30   BNP: BNP (last 3 results)  Recent Labs  04/14/14 0019  PROBNP 412.3   CBG: No results found for this basename: GLUCAP,  in the last 168 hours     Signed:  Lelon Frohlich  [facility] Pager: [phone] 04/18/2014, 5:15 PM

## 2014-04-20 DIAGNOSIS — E43 Unspecified severe protein-calorie malnutrition: Secondary | ICD-10-CM | POA: Diagnosis not present

## 2014-04-20 DIAGNOSIS — J441 Chronic obstructive pulmonary disease with (acute) exacerbation: Secondary | ICD-10-CM | POA: Diagnosis not present

## 2014-04-20 DIAGNOSIS — I10 Essential (primary) hypertension: Secondary | ICD-10-CM | POA: Diagnosis not present

## 2014-04-20 DIAGNOSIS — I4891 Unspecified atrial fibrillation: Secondary | ICD-10-CM | POA: Diagnosis not present

## 2014-04-20 DIAGNOSIS — I739 Peripheral vascular disease, unspecified: Secondary | ICD-10-CM | POA: Diagnosis not present

## 2014-04-20 DIAGNOSIS — F17200 Nicotine dependence, unspecified, uncomplicated: Secondary | ICD-10-CM | POA: Diagnosis not present

## 2014-04-23 DIAGNOSIS — I739 Peripheral vascular disease, unspecified: Secondary | ICD-10-CM | POA: Diagnosis not present

## 2014-04-23 DIAGNOSIS — J441 Chronic obstructive pulmonary disease with (acute) exacerbation: Secondary | ICD-10-CM | POA: Diagnosis not present

## 2014-04-23 DIAGNOSIS — F17200 Nicotine dependence, unspecified, uncomplicated: Secondary | ICD-10-CM | POA: Diagnosis not present

## 2014-04-23 DIAGNOSIS — I10 Essential (primary) hypertension: Secondary | ICD-10-CM | POA: Diagnosis not present

## 2014-04-23 DIAGNOSIS — I4891 Unspecified atrial fibrillation: Secondary | ICD-10-CM | POA: Diagnosis not present

## 2014-04-23 DIAGNOSIS — E43 Unspecified severe protein-calorie malnutrition: Secondary | ICD-10-CM | POA: Diagnosis not present

## 2014-04-26 DIAGNOSIS — J441 Chronic obstructive pulmonary disease with (acute) exacerbation: Secondary | ICD-10-CM | POA: Diagnosis not present

## 2014-04-26 DIAGNOSIS — I739 Peripheral vascular disease, unspecified: Secondary | ICD-10-CM | POA: Diagnosis not present

## 2014-04-26 DIAGNOSIS — I10 Essential (primary) hypertension: Secondary | ICD-10-CM | POA: Diagnosis not present

## 2014-04-26 DIAGNOSIS — I4891 Unspecified atrial fibrillation: Secondary | ICD-10-CM | POA: Diagnosis not present

## 2014-04-26 DIAGNOSIS — E43 Unspecified severe protein-calorie malnutrition: Secondary | ICD-10-CM | POA: Diagnosis not present

## 2014-04-26 DIAGNOSIS — F17200 Nicotine dependence, unspecified, uncomplicated: Secondary | ICD-10-CM | POA: Diagnosis not present

## 2014-04-27 DIAGNOSIS — J449 Chronic obstructive pulmonary disease, unspecified: Secondary | ICD-10-CM | POA: Diagnosis not present

## 2014-04-27 DIAGNOSIS — J441 Chronic obstructive pulmonary disease with (acute) exacerbation: Secondary | ICD-10-CM | POA: Diagnosis not present

## 2014-04-27 DIAGNOSIS — J209 Acute bronchitis, unspecified: Secondary | ICD-10-CM | POA: Diagnosis not present

## 2014-04-27 DIAGNOSIS — Z681 Body mass index (BMI) 19 or less, adult: Secondary | ICD-10-CM | POA: Diagnosis not present

## 2014-04-28 DIAGNOSIS — F17200 Nicotine dependence, unspecified, uncomplicated: Secondary | ICD-10-CM | POA: Diagnosis not present

## 2014-04-28 DIAGNOSIS — I4891 Unspecified atrial fibrillation: Secondary | ICD-10-CM | POA: Diagnosis not present

## 2014-04-28 DIAGNOSIS — I739 Peripheral vascular disease, unspecified: Secondary | ICD-10-CM | POA: Diagnosis not present

## 2014-04-28 DIAGNOSIS — J441 Chronic obstructive pulmonary disease with (acute) exacerbation: Secondary | ICD-10-CM | POA: Diagnosis not present

## 2014-04-28 DIAGNOSIS — I10 Essential (primary) hypertension: Secondary | ICD-10-CM | POA: Diagnosis not present

## 2014-04-28 DIAGNOSIS — E43 Unspecified severe protein-calorie malnutrition: Secondary | ICD-10-CM | POA: Diagnosis not present

## 2014-05-04 ENCOUNTER — Encounter: Payer: Self-pay | Admitting: Cardiology

## 2014-05-04 ENCOUNTER — Ambulatory Visit (INDEPENDENT_AMBULATORY_CARE_PROVIDER_SITE_OTHER): Payer: Medicare Other | Admitting: Cardiology

## 2014-05-04 VITALS — BP 102/63 | HR 72 | Ht 64.0 in | Wt 100.0 lb

## 2014-05-04 DIAGNOSIS — I739 Peripheral vascular disease, unspecified: Secondary | ICD-10-CM

## 2014-05-04 DIAGNOSIS — I481 Persistent atrial fibrillation: Secondary | ICD-10-CM | POA: Diagnosis not present

## 2014-05-04 DIAGNOSIS — I6523 Occlusion and stenosis of bilateral carotid arteries: Secondary | ICD-10-CM | POA: Diagnosis not present

## 2014-05-04 DIAGNOSIS — I4819 Other persistent atrial fibrillation: Secondary | ICD-10-CM

## 2014-05-04 NOTE — Patient Instructions (Signed)
The current medical regimen is effective;  continue present plan and medications.  Follow up in 6 months with Dr. Percival Spanish in [place].  You will receive a letter in the mail 2 months before you are due.  Please call us when you receive this letter to schedule your follow up appointment.

## 2014-05-04 NOTE — Progress Notes (Signed)
HPI The patient presents for followup of atrial fibrillation and PVD.   I do see that she was hospitalized earlier this month and I reviewed these records. She had acute on chronic lung disease. There was some mention of volume but this did not seem to be a primary issue. She had negative cardiac enzymes. An echocardiogram was unremarkable.  She is no longer smoking!  She's breathing better than she was when she was hospitalized. She's not having any chest pressure, neck or arm discomfort. She doesn't notice any palpitations and she has no presyncope or syncope. She has had weight loss over she's been well. She doesn't have any edema.   Allergies  Allergen Reactions  . Ibuprofen     Gi upset  . Albuterol     Confusion and shortness of breath  . Amoxicillin Hives  . Cephalexin Hives  . Codeine     confusion  . Entex Pac [Pseudoephedrine-Gg & Dm]   . Etanercept     vertigo  . Fluticasone-Salmeterol     Confusion and shortness of breath  . Gualenic Acid   . Lorazepam     hallucinations  . Nubain [Nalbuphine Hcl]   . Oxycodone Hcl Nausea And Vomiting  . Oxycodone-Acetaminophen Nausea And Vomiting  . Roxicodone [Oxycodone Hcl]   . Tylox [Oxycodone-Acetaminophen]     Current Outpatient Prescriptions  Medication Sig Dispense Refill  . albuterol (PROVENTIL) (2.5 MG/3ML) 0.083% nebulizer solution Take 2.5 mg by nebulization every 6 (six) hours as needed for wheezing or shortness of breath.      . benzonatate (TESSALON) 200 MG capsule Take 1 capsule (200 mg total) by mouth 3 (three) times daily.  20 capsule  0  . cilostazol (PLETAL) 100 MG tablet Take 50 mg by mouth 2 (two) times daily.       . dabigatran (PRADAXA) 150 MG CAPS Take 1 capsule (150 mg total) by mouth every 12 (twelve) hours.  60 capsule  6  . Fluticasone-Salmeterol (ADVAIR DISKUS) 250-50 MCG/DOSE AEPB Inhale 1 puff into the lungs 2 (two) times daily.  1 each  11  . ipratropium (ATROVENT) 0.02 % nebulizer solution Take 2.5  mLs (0.5 mg total) by nebulization every 4 (four) hours.  75 mL  12  . levalbuterol (XOPENEX) 0.63 MG/3ML nebulizer solution Take 3 mLs (0.63 mg total) by nebulization every 4 (four) hours.  3 mL  12  . levofloxacin (LEVAQUIN) 500 MG tablet Take 1 tablet (500 mg total) by mouth daily before breakfast.  2 tablet  0  . potassium chloride SA (K-DUR,KLOR-CON) 20 MEQ tablet Take 1 tablet (20 mEq total) by mouth 2 (two) times daily.  60 tablet  1  . predniSONE (STERAPRED UNI-PAK) 10 MG tablet Take by mouth daily. Take 6 tablets today and decrease by 1 tablet daily until none are left.  21 tablet  0  . tiotropium (SPIRIVA) 18 MCG inhalation capsule Place 1 capsule (18 mcg total) into inhaler and inhale daily.  30 capsule  12  . verapamil (CALAN-SR) 240 MG CR tablet Take 240 mg by mouth daily. At lunch       No current facility-administered medications for this visit.    Past Medical History  Diagnosis Date  . Hypertension   . GERD (gastroesophageal reflux disease)   . Cerebrovascular disease   . PAD (peripheral artery disease)     lower extremity PAD with bilateral leg stenting  . TIA (transient ischemic attack)   . COPD (chronic obstructive pulmonary  disease)   . CVA 10/25/2008    Qualifier: Diagnosis of  By: [name], CNA, Christy    . Internal carotid artery stenosis 05/13/2012    HIGH GRADE LEFT INTERNAL CAROTID STENOSIS.  S/p  L CEA 05/11/12  . Irregular heart beat   . DVT (deep venous thrombosis)     Past Surgical History  Procedure Laterality Date  . Appendectomy    . Partial hysterectomy    . Gallbladder surgery    . Cholecystectomy    . Endarterectomy  05/11/2012    Procedure: ENDARTERECTOMY CAROTID;  Surgeon: [name], MD;  Location: [place];  Service: Vascular;  Laterality: Left;  . Patch angioplasty  05/11/2012    Procedure: PATCH ANGIOPLASTY;  Surgeon: [name], MD;  Location: [facility] OR;  Service: Vascular;  Laterality: Left;  with Dacron Patch Angioplasty  . Carotid  endarterectomy      ROS:  She has a point of tenderness on her back.  Otherwise as stated in the HPI and negative for all other systems.  PHYSICAL EXAM There were no vitals taken for this visit. GENERAL:  Well appearing HEENT:  Pupils equal round and reactive, fundi not visualized, oral mucosa unremarkable, dentures NECK:  No jugular venous distention, waveform within normal limits, carotid upstroke brisk and symmetric, healing left carotid endarterectomy scar, no thyromegaly LYMPHATICS:  No cervical, inguinal adenopathy LUNGS:  Clear to auscultation bilaterally, decreased BS CHEST:  Unremarkable HEART:  PMI not displaced or sustained,S1 and S2 within normal limits, no S3, no clicks, no rubs, no murmurs, irregular ABD:  Flat, positive bowel sounds normal in frequency in pitch, no bruits, no rebound, no guarding, no midline pulsatile mass, no hepatomegaly, no splenomegaly EXT:  2 plus pulses upper and diminished bilateral PT/DP, no edema, no cyanosis no clubbing    ASSESSMENT AND PLAN  ATRIAL FIBRILLATION -  The patient  tolerates this rhythm and rate control and anticoagulation. We will continue with the meds as listed.  HYPERTENSION - Her blood pressures have been at target.  No change in therapy is indicated.  PVD -  We will continue with risk reduction.    TOBACCO ABUSE -  She is now using vapors.    CAROTID ARTERY DISEASE -  This will now be followed by Dr. Oneida Alar.

## 2014-05-06 DIAGNOSIS — I10 Essential (primary) hypertension: Secondary | ICD-10-CM | POA: Diagnosis not present

## 2014-05-06 DIAGNOSIS — I4891 Unspecified atrial fibrillation: Secondary | ICD-10-CM | POA: Diagnosis not present

## 2014-05-06 DIAGNOSIS — F17200 Nicotine dependence, unspecified, uncomplicated: Secondary | ICD-10-CM | POA: Diagnosis not present

## 2014-05-06 DIAGNOSIS — E43 Unspecified severe protein-calorie malnutrition: Secondary | ICD-10-CM | POA: Diagnosis not present

## 2014-05-06 DIAGNOSIS — I739 Peripheral vascular disease, unspecified: Secondary | ICD-10-CM | POA: Diagnosis not present

## 2014-05-06 DIAGNOSIS — J441 Chronic obstructive pulmonary disease with (acute) exacerbation: Secondary | ICD-10-CM | POA: Diagnosis not present

## 2014-05-24 DIAGNOSIS — I4891 Unspecified atrial fibrillation: Secondary | ICD-10-CM | POA: Diagnosis not present

## 2014-05-24 DIAGNOSIS — I10 Essential (primary) hypertension: Secondary | ICD-10-CM | POA: Diagnosis not present

## 2014-05-24 DIAGNOSIS — E43 Unspecified severe protein-calorie malnutrition: Secondary | ICD-10-CM | POA: Diagnosis not present

## 2014-05-24 DIAGNOSIS — F17200 Nicotine dependence, unspecified, uncomplicated: Secondary | ICD-10-CM | POA: Diagnosis not present

## 2014-05-24 DIAGNOSIS — J441 Chronic obstructive pulmonary disease with (acute) exacerbation: Secondary | ICD-10-CM | POA: Diagnosis not present

## 2014-05-24 DIAGNOSIS — I739 Peripheral vascular disease, unspecified: Secondary | ICD-10-CM | POA: Diagnosis not present

## 2014-06-14 DIAGNOSIS — F17200 Nicotine dependence, unspecified, uncomplicated: Secondary | ICD-10-CM | POA: Diagnosis not present

## 2014-06-14 DIAGNOSIS — I4891 Unspecified atrial fibrillation: Secondary | ICD-10-CM | POA: Diagnosis not present

## 2014-06-14 DIAGNOSIS — J441 Chronic obstructive pulmonary disease with (acute) exacerbation: Secondary | ICD-10-CM | POA: Diagnosis not present

## 2014-06-14 DIAGNOSIS — I10 Essential (primary) hypertension: Secondary | ICD-10-CM | POA: Diagnosis not present

## 2014-06-14 DIAGNOSIS — E43 Unspecified severe protein-calorie malnutrition: Secondary | ICD-10-CM | POA: Diagnosis not present

## 2014-06-14 DIAGNOSIS — I739 Peripheral vascular disease, unspecified: Secondary | ICD-10-CM | POA: Diagnosis not present

## 2014-06-16 ENCOUNTER — Encounter (HOSPITAL_COMMUNITY): Payer: Self-pay | Admitting: Vascular Surgery

## 2014-06-30 ENCOUNTER — Emergency Department (HOSPITAL_COMMUNITY): Payer: Medicare Other

## 2014-06-30 ENCOUNTER — Inpatient Hospital Stay (HOSPITAL_COMMUNITY)
Admission: EM | Admit: 2014-06-30 | Discharge: 2014-07-02 | DRG: 392 | Disposition: A | Discharge status: Home / Self Care | Payer: Medicare Other | Attending: Internal Medicine | Admitting: Internal Medicine

## 2014-06-30 ENCOUNTER — Encounter (HOSPITAL_COMMUNITY): Payer: Self-pay

## 2014-06-30 DIAGNOSIS — K625 Hemorrhage of anus and rectum: Secondary | ICD-10-CM | POA: Diagnosis not present

## 2014-06-30 DIAGNOSIS — D62 Acute posthemorrhagic anemia: Secondary | ICD-10-CM | POA: Diagnosis present

## 2014-06-30 DIAGNOSIS — R000 Tachycardia, unspecified: Secondary | ICD-10-CM | POA: Diagnosis not present

## 2014-06-30 DIAGNOSIS — J449 Chronic obstructive pulmonary disease, unspecified: Secondary | ICD-10-CM | POA: Diagnosis present

## 2014-06-30 DIAGNOSIS — E876 Hypokalemia: Secondary | ICD-10-CM | POA: Diagnosis present

## 2014-06-30 DIAGNOSIS — I481 Persistent atrial fibrillation: Secondary | ICD-10-CM

## 2014-06-30 DIAGNOSIS — K921 Melena: Secondary | ICD-10-CM | POA: Diagnosis not present

## 2014-06-30 DIAGNOSIS — J410 Simple chronic bronchitis: Secondary | ICD-10-CM | POA: Diagnosis not present

## 2014-06-30 DIAGNOSIS — J438 Other emphysema: Secondary | ICD-10-CM | POA: Diagnosis not present

## 2014-06-30 DIAGNOSIS — I739 Peripheral vascular disease, unspecified: Secondary | ICD-10-CM | POA: Diagnosis present

## 2014-06-30 DIAGNOSIS — I4891 Unspecified atrial fibrillation: Secondary | ICD-10-CM | POA: Diagnosis present

## 2014-06-30 DIAGNOSIS — Z7901 Long term (current) use of anticoagulants: Secondary | ICD-10-CM | POA: Diagnosis not present

## 2014-06-30 DIAGNOSIS — K219 Gastro-esophageal reflux disease without esophagitis: Secondary | ICD-10-CM | POA: Diagnosis present

## 2014-06-30 DIAGNOSIS — T45515A Adverse effect of anticoagulants, initial encounter: Secondary | ICD-10-CM | POA: Diagnosis present

## 2014-06-30 DIAGNOSIS — Z87891 Personal history of nicotine dependence: Secondary | ICD-10-CM | POA: Diagnosis not present

## 2014-06-30 DIAGNOSIS — Z8673 Personal history of transient ischemic attack (TIA), and cerebral infarction without residual deficits: Secondary | ICD-10-CM

## 2014-06-30 DIAGNOSIS — K529 Noninfective gastroenteritis and colitis, unspecified: Secondary | ICD-10-CM | POA: Diagnosis not present

## 2014-06-30 DIAGNOSIS — I10 Essential (primary) hypertension: Secondary | ICD-10-CM | POA: Diagnosis present

## 2014-06-30 DIAGNOSIS — I679 Cerebrovascular disease, unspecified: Secondary | ICD-10-CM | POA: Diagnosis present

## 2014-06-30 DIAGNOSIS — A09 Infectious gastroenteritis and colitis, unspecified: Secondary | ICD-10-CM | POA: Diagnosis present

## 2014-06-30 DIAGNOSIS — K439 Ventral hernia without obstruction or gangrene: Secondary | ICD-10-CM | POA: Diagnosis not present

## 2014-06-30 DIAGNOSIS — I480 Paroxysmal atrial fibrillation: Secondary | ICD-10-CM | POA: Diagnosis not present

## 2014-06-30 LAB — CBC WITH DIFFERENTIAL/PLATELET
BASOS ABS: 0.1 10*3/uL (ref 0.0–0.1)
Basophils Relative: 1 % (ref 0–1)
EOS PCT: 2 % (ref 0–5)
Eosinophils Absolute: 0.2 10*3/uL (ref 0.0–0.7)
HEMATOCRIT: 37.8 % (ref 36.0–46.0)
Hemoglobin: 12.3 g/dL (ref 12.0–15.0)
LYMPHS ABS: 1.3 10*3/uL (ref 0.7–4.0)
LYMPHS PCT: 18 % (ref 12–46)
MCH: 26.9 pg (ref 26.0–34.0)
MCHC: 32.5 g/dL (ref 30.0–36.0)
MCV: 82.7 fL (ref 78.0–100.0)
MONO ABS: 0.6 10*3/uL (ref 0.1–1.0)
Monocytes Relative: 8 % (ref 3–12)
Neutro Abs: 5.1 10*3/uL (ref 1.7–7.7)
Neutrophils Relative %: 71 % (ref 43–77)
PLATELETS: 205 10*3/uL (ref 150–400)
RBC: 4.57 MIL/uL (ref 3.87–5.11)
RDW: 18.6 % — ABNORMAL HIGH (ref 11.5–15.5)
WBC: 7.2 10*3/uL (ref 4.0–10.5)

## 2014-06-30 LAB — BASIC METABOLIC PANEL
ANION GAP: 6 (ref 5–15)
BUN: 15 mg/dL (ref 6–23)
CALCIUM: 8.7 mg/dL (ref 8.4–10.5)
CHLORIDE: 96 meq/L (ref 96–112)
CO2: 36 mmol/L — AB (ref 19–32)
Creatinine, Ser: 0.72 mg/dL (ref 0.50–1.10)
GFR calc Af Amer: >90 mL/min (ref >90)
GFR calc non Af Amer: 81 mL/min — ABNORMAL LOW (ref >90)
GLUCOSE: 126 mg/dL — AB (ref 70–99)
Potassium: 3.3 mmol/L — ABNORMAL LOW (ref 3.5–5.1)
SODIUM: 138 mmol/L (ref 135–145)

## 2014-06-30 LAB — URINALYSIS, ROUTINE W REFLEX MICROSCOPIC
Bilirubin Urine: NEGATIVE
Glucose, UA: NEGATIVE mg/dL
KETONES UR: NEGATIVE mg/dL
Leukocytes, UA: NEGATIVE
NITRITE: NEGATIVE
Protein, ur: NEGATIVE mg/dL
SPECIFIC GRAVITY, URINE: 1.015 (ref 1.005–1.030)
Urobilinogen, UA: 2 mg/dL — ABNORMAL HIGH (ref 0.0–1.0)
pH: 7.5 (ref 5.0–8.0)

## 2014-06-30 LAB — HEPATIC FUNCTION PANEL
ALBUMIN: 3.6 g/dL (ref 3.5–5.2)
ALK PHOS: 77 U/L (ref 39–117)
ALT: 16 U/L (ref 0–35)
AST: 23 U/L (ref 0–37)
BILIRUBIN INDIRECT: 0.7 mg/dL (ref 0.3–0.9)
Bilirubin, Direct: 0.1 mg/dL (ref 0.0–0.3)
Total Bilirubin: 0.8 mg/dL (ref 0.3–1.2)
Total Protein: 6.1 g/dL (ref 6.0–8.3)

## 2014-06-30 LAB — LIPASE, BLOOD: LIPASE: 15 U/L (ref 11–59)

## 2014-06-30 LAB — URINE MICROSCOPIC-ADD ON

## 2014-06-30 LAB — TROPONIN I

## 2014-06-30 LAB — LACTIC ACID, PLASMA: LACTIC ACID, VENOUS: 1.1 mmol/L (ref 0.5–2.2)

## 2014-06-30 MED ORDER — METRONIDAZOLE IN NACL 5-0.79 MG/ML-% IV SOLN
500.0000 mg | Freq: Once | INTRAVENOUS | Status: AC
  Administered 2014-06-30: 500 mg via INTRAVENOUS
  Filled 2014-06-30: qty 100

## 2014-06-30 MED ORDER — TIOTROPIUM BROMIDE MONOHYDRATE 18 MCG IN CAPS
18.0000 ug | ORAL_CAPSULE | Freq: Every day | RESPIRATORY_TRACT | Status: DC
  Administered 2014-06-30 – 2014-07-01 (×2): 18 ug via RESPIRATORY_TRACT
  Filled 2014-06-30: qty 5

## 2014-06-30 MED ORDER — SODIUM CHLORIDE 0.9 % IV SOLN
INTRAVENOUS | Status: DC
  Administered 2014-06-30: 13:00:00 via INTRAVENOUS

## 2014-06-30 MED ORDER — CIPROFLOXACIN IN D5W 400 MG/200ML IV SOLN
400.0000 mg | Freq: Once | INTRAVENOUS | Status: AC
  Administered 2014-06-30: 400 mg via INTRAVENOUS
  Filled 2014-06-30: qty 200

## 2014-06-30 MED ORDER — SODIUM CHLORIDE 0.9 % IV SOLN: INTRAVENOUS | Status: AC

## 2014-06-30 MED ORDER — VERAPAMIL HCL ER 240 MG PO TBCR
240.0000 mg | EXTENDED_RELEASE_TABLET | Freq: Every day | ORAL | Status: DC
  Administered 2014-06-30 – 2014-07-02 (×3): 240 mg via ORAL
  Filled 2014-06-30 (×6): qty 1

## 2014-06-30 MED ORDER — BUDESONIDE-FORMOTEROL FUMARATE 160-4.5 MCG/ACT IN AERO
2.0000 | INHALATION_SPRAY | Freq: Two times a day (BID) | RESPIRATORY_TRACT | Status: DC
  Administered 2014-06-30 – 2014-07-02 (×4): 2 via RESPIRATORY_TRACT
  Filled 2014-06-30: qty 6

## 2014-06-30 MED ORDER — SODIUM CHLORIDE 0.9 % IV SOLN
INTRAVENOUS | Status: DC
Start: 2014-06-30 — End: 2014-07-02
  Administered 2014-06-30 – 2014-07-02 (×4): via INTRAVENOUS

## 2014-06-30 MED ORDER — ALBUTEROL SULFATE (2.5 MG/3ML) 0.083% IN NEBU
2.5000 mg | INHALATION_SOLUTION | Freq: Three times a day (TID) | RESPIRATORY_TRACT | Status: DC
  Administered 2014-07-01 – 2014-07-02 (×4): 2.5 mg via RESPIRATORY_TRACT
  Filled 2014-06-30 (×4): qty 3

## 2014-06-30 MED ORDER — SODIUM CHLORIDE 0.9 % IJ SOLN
INTRAMUSCULAR | Status: AC
  Filled 2014-06-30: qty 15

## 2014-06-30 MED ORDER — ONDANSETRON HCL 4 MG/2ML IJ SOLN: 4.0000 mg | Freq: Four times a day (QID) | INTRAMUSCULAR | Status: DC | PRN

## 2014-06-30 MED ORDER — METRONIDAZOLE IN NACL 5-0.79 MG/ML-% IV SOLN
500.0000 mg | Freq: Three times a day (TID) | INTRAVENOUS | Status: DC
  Administered 2014-06-30 – 2014-07-02 (×5): 500 mg via INTRAVENOUS
  Filled 2014-06-30 (×5): qty 100

## 2014-06-30 MED ORDER — IOHEXOL 300 MG/ML  SOLN
100.0000 mL | Freq: Once | INTRAMUSCULAR | Status: AC | PRN
  Administered 2014-06-30: 100 mL via INTRAVENOUS

## 2014-06-30 MED ORDER — ONDANSETRON HCL 4 MG PO TABS: 4.0000 mg | ORAL_TABLET | Freq: Four times a day (QID) | ORAL | Status: DC | PRN

## 2014-06-30 MED ORDER — HYDROCHLOROTHIAZIDE 12.5 MG PO CAPS
12.5000 mg | ORAL_CAPSULE | Freq: Every day | ORAL | Status: DC
  Administered 2014-07-01 – 2014-07-02 (×2): 12.5 mg via ORAL
  Filled 2014-06-30 (×4): qty 1

## 2014-06-30 MED ORDER — POTASSIUM CHLORIDE CRYS ER 20 MEQ PO TBCR
20.0000 meq | EXTENDED_RELEASE_TABLET | Freq: Every day | ORAL | Status: DC
  Administered 2014-06-30 – 2014-07-02 (×3): 20 meq via ORAL
  Filled 2014-06-30 (×3): qty 1

## 2014-06-30 MED ORDER — SODIUM CHLORIDE 0.9 % IJ SOLN
INTRAMUSCULAR | Status: AC
  Filled 2014-06-30: qty 500

## 2014-06-30 MED ORDER — IOHEXOL 300 MG/ML  SOLN
50.0000 mL | Freq: Once | INTRAMUSCULAR | Status: AC | PRN
  Administered 2014-06-30: 50 mL via ORAL

## 2014-06-30 MED ORDER — ALBUTEROL SULFATE (5 MG/ML) 0.5% IN NEBU
2.5000 mg | INHALATION_SOLUTION | Freq: Four times a day (QID) | RESPIRATORY_TRACT | Status: DC
  Administered 2014-06-30: 2.5 mg via RESPIRATORY_TRACT
  Filled 2014-06-30 (×14): qty 0.5
  Filled 2014-06-30: qty 3
  Filled 2014-06-30 (×3): qty 0.5

## 2014-06-30 MED ORDER — CIPROFLOXACIN IN D5W 400 MG/200ML IV SOLN
400.0000 mg | Freq: Two times a day (BID) | INTRAVENOUS | Status: DC
  Administered 2014-07-01 – 2014-07-02 (×3): 400 mg via INTRAVENOUS
  Filled 2014-06-30 (×3): qty 200

## 2014-06-30 NOTE — Progress Notes (Signed)
79 yo F presented with possible intra-abdominal infection.  Asked to initiate Cipro by EDP. Estimated CrCl ~ 39ml/min.    Cipro 400mg  IV x1 dose given in ED Protocol canceled, medication continued on admission. Pharmacy to sign off.  Please re-consult if needed.    Thanks!  Netta Cedars, PharmD, BCPS 06/30/2014@4 :34 PM

## 2014-06-30 NOTE — ED Notes (Signed)
Pt reports blood in stool x 24 hours.  Denies abd pain, n/v.

## 2014-06-30 NOTE — ED Notes (Signed)
Ems reports cbg 131

## 2014-06-30 NOTE — ED Notes (Signed)
Have attempted to call report x 2.  RN has been unable to take report.  She states she will call back in 10 min.

## 2014-06-30 NOTE — H&P (Signed)
[facility] History and Physical  Caroline [name] CBJ:[id number] DOB: 09/10/1934 DOA: 06/30/2014  Referring physician: ER PCP: Caroline Herring., MD   Chief Complaint: Rectal bleeding  HPI: Caroline [name] is a 79 y.o. female  This is a 79 year old lady who started to have rectal bleeding yesterday around midday. It was largely painless rectal bleeding without any associated abdominal pain except for some anal pain. There was no nausea or vomiting. Initially it was rectal bleeding alone and then today it was mixed in with stool. There is been no dark stools/black stools. There is no hematemesis. There is no fever. This lady has atrial fibrillation and is on chronic anticoagulation with Pradaxa, and has been on this for the last 2 years or so and prior to this was on warfarin. Evaluation in the emergency room shows an abnormal CT scan, possibly suggestive of infection or inflammatory process. She is now being made for further management.   Review of Systems:  Apart from symptoms above, all other systems are negative.  Past Medical History  Diagnosis Date  . Hypertension   . GERD (gastroesophageal reflux disease)   . Cerebrovascular disease   . PAD (peripheral artery disease)     lower extremity PAD with bilateral leg stenting  . TIA (transient ischemic attack)   . COPD (chronic obstructive pulmonary disease)   . CVA 10/25/2008    Qualifier: Diagnosis of  By: Caroline Cliche, CNA, Christy    . Internal carotid artery stenosis 05/13/2012    HIGH GRADE LEFT INTERNAL CAROTID STENOSIS.  S/p  L CEA 05/11/12  . Irregular heart beat   . DVT (deep venous thrombosis)    Past Surgical History  Procedure Laterality Date  . Appendectomy    . Partial hysterectomy    . Gallbladder surgery    . Cholecystectomy    . Endarterectomy  05/11/2012    Procedure: ENDARTERECTOMY CAROTID;  Surgeon: Caroline Dutch, MD;  Location: [place];  Service: Vascular;  Laterality: Left;  . Patch angioplasty  05/11/2012   Procedure: PATCH ANGIOPLASTY;  Surgeon: Caroline Dutch, MD;  Location: [facility] OR;  Service: Vascular;  Laterality: Left;  with Dacron Patch Angioplasty  . Carotid endarterectomy    . Carotid angiogram N/A 05/06/2012    Procedure: CAROTID ANGIOGRAM;  Surgeon: Caroline Dutch, MD;  Location: [facility] CATH LAB;  Service: Cardiovascular;  Laterality: N/A;   Social History:  reports that she has quit smoking. She uses smokeless tobacco. She reports that she does not drink alcohol or use illicit drugs.  Allergies  Allergen Reactions  . Ibuprofen     Gi upset  . Pnu-Imune [Pneumococcal Polysaccharide Vaccine]     Huge knot at site and rash  . Albuterol     Confusion and shortness of breath  . Amoxicillin Hives  . Cephalexin Hives  . Codeine     confusion  . Entex Pac [Pseudoephedrine-Gg & Dm]   . Etanercept     vertigo  . Fluticasone-Salmeterol     Confusion and shortness of breath  . Gualenic Acid   . Lorazepam     hallucinations  . Nubain [Nalbuphine Hcl]   . Oxycodone Hcl Nausea And Vomiting  . Oxycodone-Acetaminophen Nausea And Vomiting  . Roxicodone [Oxycodone Hcl]   . Tylox [Oxycodone-Acetaminophen]     Family History  Problem Relation Age of Onset  . Deep vein thrombosis Sister     Blood Clot in leg  There is no family history of malignancy.   Prior  to Admission medications   Medication Sig Start Date End Date Taking? Authorizing Provider  budesonide-formoterol (SYMBICORT) 160-4.5 MCG/ACT inhaler Inhale 2 puffs into the lungs 2 (two) times daily.   Yes Historical Provider, MD  cilostazol (PLETAL) 100 MG tablet Take 100 mg by mouth daily.    Yes Historical Provider, MD  Cyanocobalamin (B-12) 2500 MCG SUBL Place 2,500 mcg under the tongue.   Yes Historical Provider, MD  dabigatran (PRADAXA) 150 MG CAPS Take 1 capsule (150 mg total) by mouth every 12 (twelve) hours. 07/14/12  Yes Minus Breeding, MD  hydrochlorothiazide (MICROZIDE) 12.5 MG capsule Take 12.5 mg by mouth daily.   Yes  Historical Provider, MD  potassium chloride SA (K-DUR,KLOR-CON) 20 MEQ tablet Take 20 mEq by mouth daily.   Yes Historical Provider, MD  tiotropium (SPIRIVA) 18 MCG inhalation capsule Place 18 mcg into inhaler and inhale at bedtime.   Yes Historical Provider, MD  verapamil (CALAN-SR) 240 MG CR tablet Take 240 mg by mouth daily. Takes at lunch time   Yes Historical Provider, MD  levalbuterol Penne Lash) 0.63 MG/3ML nebulizer solution Take 3 mLs (0.63 mg total) by nebulization every 4 (four) hours. 04/18/14   Caroline Hau, MD  potassium chloride SA (K-DUR,KLOR-CON) 20 MEQ tablet Take 1 tablet (20 mEq total) by mouth 2 (two) times daily. Patient not taking: Reported on 06/30/2014 04/18/14   Caroline Hau, MD   Physical Exam: Filed Vitals:   06/30/14 1204 06/30/14 1416  BP: 117/85 133/89  Pulse: 116 105  Temp: 98.6 F (37 C)   TempSrc: Oral   Resp: 18 18  Height: 5\' 4"  (1.626 m)   Weight: 43.092 kg (95 lb)   SpO2: 96% 98%    Wt Readings from Last 3 Encounters:  06/30/14 43.092 kg (95 lb)  05/04/14 45.36 kg (100 lb)  04/18/14 48.217 kg (106 lb 4.8 oz)    General:  Appears calm and comfortable. She is not toxic or septic. She is not pale. She appears to be hemodynamically stable. Eyes: PERRL, normal lids, irises & conjunctiva ENT: grossly normal hearing, lips & tongue Neck: no LAD, masses or thyromegaly Cardiovascular: Irregularly irregular, consistent with atrial fibrillation. Her ventricular rate is not significantly elevated. Telemetry: Atrial fibrillation.  Respiratory: CTA bilaterally, no w/r/r. Normal respiratory effort. Abdomen: soft, ntnd. Rectal examination not done. This was done earlier by the emergency room physician. Skin: no rash or induration seen on limited exam Musculoskeletal: grossly normal tone BUE/BLE Psychiatric: grossly normal mood and affect, speech fluent and appropriate Neurologic: grossly non-focal.          Labs on Admission:    Basic Metabolic Panel:  Recent Labs Lab 06/30/14 1307  NA 138  K 3.3*  CL 96  CO2 36*  GLUCOSE 126*  BUN 15  CREATININE 0.72  CALCIUM 8.7   Liver Function Tests:  Recent Labs Lab 06/30/14 1307  AST 23  ALT 16  ALKPHOS 77  BILITOT 0.8  PROT 6.1  ALBUMIN 3.6    Recent Labs Lab 06/30/14 1307  LIPASE 15   No results for input(s): AMMONIA in the last 168 hours. CBC:  Recent Labs Lab 06/30/14 1307  WBC 7.2  NEUTROABS 5.1  HGB 12.3  HCT 37.8  MCV 82.7  PLT 205   Cardiac Enzymes:  Recent Labs Lab 06/30/14 1307  TROPONINI <0.03    BNP (last 3 results)  Recent Labs  04/14/14 0019  PROBNP 412.3   CBG: No results for input(s): GLUCAP  in the last 168 hours.  Radiological Exams on Admission: Ct Abdomen Pelvis W Contrast  06/30/2014   CLINICAL DATA:  Gastrointestinal bleeding for 1 day. Hypertension. COPD.  EXAM: CT ABDOMEN AND PELVIS WITH CONTRAST  TECHNIQUE: Multidetector CT imaging of the abdomen and pelvis was performed using the standard protocol following bolus administration of intravenous contrast.  CONTRAST:  117mL OMNIPAQUE IOHEXOL 300 MG/ML  SOLN  COMPARISON:  08/07/2006  FINDINGS: Lower chest:  Mild peripheral interstitial accentuation.  Hepatobiliary: Mild intrahepatic biliary dilatation with common bile duct measuring 6 mm, within normal limits.  Pancreas: Mildly prominent dorsal pancreatic duct bite would secondary to pancreas divisum.  Spleen: Unremarkable  Adrenals/Urinary Tract: 2.9 cm right kidney upper pole Bosniak category 1 cyst. 0.9 cm right kidney lower pole Bosniak category 1 cyst. Mild scarring in several tiny likely cysts in the left kidney.  Stomach/Bowel: Prominence of stool throughout much of the colon. Wall thickening and poor definition of fat planes in the lower rectum and anal region. There also questionable focal areas of wall thickening in the ascending colon, for example posteriorly on image 33 of series 2 if and also on image  39 of series 2.  Vascular/Lymphatic: Aortoiliac atherosclerotic vascular disease. Fusiform infrarenal abdominal aortic aneurysm, 3.6 cm diameter.  Reproductive: Uterus absent.  Ovaries not well seen  Other: No supplemental non-categorized findings.  Musculoskeletal: Right unilateral chronic pars defect at L5 with mild grade 1 anterolisthesis. Fatty density in the subcutaneous tissues anterior to the stomach may represent a very small ventral hernia or less likely a lipoma.  IMPRESSION: 1. Considerable abnormal wall thickening in the distal rectum and anal region, with adjacent low grade stranding in the perirectal space, suspicious for inflammatory process, less likely to be tumor. 2. Several areas of potential wall thickening in the ascending colon -tumor is not excluded and colonoscopy may be warranted if not recently performed once the patient's acute symptoms are effectively treated. 3. Pancreas divisum. 4. Fusiform infrarenal abdominal aortic aneurysm, 3.6 cm in diameter. Recommend followup by [place] in 2 years. This recommendation follows ACR consensus guidelines: White Paper of the ACR Incidental Findings Committee II on Vascular Findings. J Am Coll Radiol 2013; 10:789-794. 5. Small left eccentric ventral hernia containing adipose tissue.   Electronically Signed   By: Sherryl Barters M.D.   On: 06/30/2014 15:06    EKG: Independently reviewed. Atrial fibrillation without any acute ST-T wave changes.  Assessment/Plan   1. Rectal bleeding-etiology is not entirely clear. CT scan reported that are reviewed and is suspicious for an inflammatory process involving the distal rectum and anal region but also surprisingly there are areas in the descending colon which are abnormal. In view of the atrial fibrillation, I wonder whether this represents an ischemic colon on or infectious. We will hold anticoagulation. We will start her on empirical intravenous antibiotics with ciprofloxacin and metronidazole. Follow  hemoglobin. I will ask gastroenterology to see her, she may need colonoscopy. She has not had a colonoscopy previously. 2. Atrial fibrillation, ventricular rate appears to be reasonably well controlled and we will continue to monitor clinically. Anticoagulation will be held in view of the rectal bleeding. 3. COPD, appears to be stable without exacerbation.  Further recommendations will depend on patient's hospital progress.   Code Status: Full code  DVT Prophylaxis: SCDs  Family Communication: I discussed the plan with the patient at the bedside.   Disposition Plan: Home when medically stable.   Time spent: 60 minutes  [facility] Pager [phone].

## 2014-06-30 NOTE — ED Provider Notes (Signed)
CSN: [id number]     Arrival date & time 06/30/14  1153 History   First MD Initiated Contact with Patient 06/30/14 1212     Chief Complaint  Patient presents with  . GI Bleeding      HPI Pt was seen at 1220. Per pt, c/o gradual onset and persistence of multiple intermittent episodes of bloody BM's that began yesterday.  Describes the stools as "just dark blood." Denies N/V, no abd pain, no CP/SOB, no back pain, no fevers, no black stools, no rectal pain.     Past Medical History  Diagnosis Date  . Hypertension   . GERD (gastroesophageal reflux disease)   . Cerebrovascular disease   . PAD (peripheral artery disease)     lower extremity PAD with bilateral leg stenting  . TIA (transient ischemic attack)   . COPD (chronic obstructive pulmonary disease)   . CVA 10/25/2008    Qualifier: Diagnosis of  By: [name], CNA, Christy    . Internal carotid artery stenosis 05/13/2012    HIGH GRADE LEFT INTERNAL CAROTID STENOSIS.  S/p  L CEA 05/11/12  . Irregular heart beat   . DVT (deep venous thrombosis)    Past Surgical History  Procedure Laterality Date  . Appendectomy    . Partial hysterectomy    . Gallbladder surgery    . Cholecystectomy    . Endarterectomy  05/11/2012    Procedure: ENDARTERECTOMY CAROTID;  Surgeon: [name], MD;  Location: [place];  Service: Vascular;  Laterality: Left;  . Patch angioplasty  05/11/2012    Procedure: PATCH ANGIOPLASTY;  Surgeon: [name], MD;  Location: [facility] OR;  Service: Vascular;  Laterality: Left;  with Dacron Patch Angioplasty  . Carotid endarterectomy    . Carotid angiogram N/A 05/06/2012    Procedure: CAROTID ANGIOGRAM;  Surgeon: [name], MD;  Location: [facility] CATH LAB;  Service: Cardiovascular;  Laterality: N/A;   Family History  Problem Relation Age of Onset  . Deep vein thrombosis Sister     Blood Clot in leg   History  Substance Use Topics  . Smoking status: Former Smoker -- 1.00 packs/day  . Smokeless tobacco: Current User   . Alcohol Use: No    Review of Systems ROS: Statement: All systems negative except as marked or noted in the HPI; Constitutional: Negative for fever and chills. ; ; Eyes: Negative for eye pain, redness and discharge. ; ; ENMT: Negative for ear pain, hoarseness, nasal congestion, sinus pressure and sore throat. ; ; Cardiovascular: Negative for chest pain, palpitations, diaphoresis, dyspnea and peripheral edema. ; ; Respiratory: Negative for cough, wheezing and stridor. ; ; Gastrointestinal: Negative for nausea, vomiting, diarrhea, abdominal pain, hematemesis, jaundice. +blood in stool. ; ; Genitourinary: Negative for dysuria, flank pain and hematuria. ; ; Musculoskeletal: Negative for back pain and neck pain. Negative for swelling and trauma.; ; Skin: Negative for pruritus, rash, abrasions, blisters, bruising and skin lesion.; ; Neuro: Negative for headache, lightheadedness and neck stiffness. Negative for weakness, altered level of consciousness , altered mental status, extremity weakness, paresthesias, involuntary movement, seizure and syncope.      Allergies  Ibuprofen; Pnu-imune; Albuterol; Amoxicillin; Cephalexin; Codeine; Entex pac; Etanercept; Fluticasone-salmeterol; Gualenic acid; Lorazepam; Nubain; Oxycodone hcl; Oxycodone-acetaminophen; Roxicodone; and Tylox  Home Medications   Prior to Admission medications   Medication Sig Start Date End Date Taking? Authorizing Provider  budesonide-formoterol (SYMBICORT) 160-4.5 MCG/ACT inhaler Inhale 2 puffs into the lungs 2 (two) times daily.   Yes Historical Provider,  MD  cilostazol (PLETAL) 100 MG tablet Take 100 mg by mouth daily.    Yes Historical Provider, MD  Cyanocobalamin (B-12) 2500 MCG SUBL Place 2,500 mcg under the tongue.   Yes Historical Provider, MD  dabigatran (PRADAXA) 150 MG CAPS Take 1 capsule (150 mg total) by mouth every 12 (twelve) hours. 07/14/12  Yes Minus Breeding, MD  hydrochlorothiazide (MICROZIDE) 12.5 MG capsule Take 12.5  mg by mouth daily.   Yes Historical Provider, MD  potassium chloride SA (K-DUR,KLOR-CON) 20 MEQ tablet Take 20 mEq by mouth daily.   Yes Historical Provider, MD  tiotropium (SPIRIVA) 18 MCG inhalation capsule Place 18 mcg into inhaler and inhale at bedtime.   Yes Historical Provider, MD  verapamil (CALAN-SR) 240 MG CR tablet Take 240 mg by mouth daily. Takes at lunch time   Yes Historical Provider, MD  levalbuterol Penne Lash) 0.63 MG/3ML nebulizer solution Take 3 mLs (0.63 mg total) by nebulization every 4 (four) hours. 04/18/14   Erline Hau, MD  potassium chloride SA (K-DUR,KLOR-CON) 20 MEQ tablet Take 1 tablet (20 mEq total) by mouth 2 (two) times daily. Patient not taking: Reported on 06/30/2014 04/18/14   Erline Hau, MD   BP 133/89 mmHg  Pulse 105  Temp(Src) 98.6 F (37 C) (Oral)  Resp 18  Ht 5\' 4"  (1.626 m)  Wt 95 lb (43.092 kg)  BMI 16.30 kg/m2  SpO2 98% Physical Exam  1225: Physical examination:  Nursing notes reviewed; Vital signs and O2 SAT reviewed;  Constitutional: Well developed, Well nourished, Well hydrated, In no acute distress; Head:  Normocephalic, atraumatic; Eyes: EOMI, PERRL, No scleral icterus; ENMT: Mouth and pharynx normal, Mucous membranes moist; Neck: Supple, Full range of motion, No lymphadenopathy; Cardiovascular: Irregular irregular rate and rhythm, No gallop; Respiratory: Breath sounds clear & equal bilaterally, No rales, rhonchi, wheezes.  Speaking full sentences with ease, Normal respiratory effort/excursion; Chest: Nontender, Movement normal; Abdomen: Soft, Nontender, Nondistended, Normal bowel sounds. Rectal exam performed w/permission of pt and ED RN chaperone present.  Anal tone normal.  Non-tender, soft brown-maroon stool in rectal vault, heme positive.  No fissures, +external hemorrhoids without thrombosis or bleeding. No palp masses.; Genitourinary: No CVA tenderness; Extremities: Pulses normal, No tenderness, No edema, No calf  edema or asymmetry.; Neuro: AA&Ox3, Major CN grossly intact.  Speech clear. No gross focal motor or sensory deficits in extremities.; Skin: Color normal, Warm, Dry.   ED Course  Procedures     EKG Interpretation   Date/Time:  Thursday June 30 2014 12:02:05 EST Ventricular Rate:  113 PR Interval:    QRS Duration: 74 QT Interval:  332 QTC Calculation: 455 R Axis:   82 Text Interpretation:  Atrial fibrillation Borderline right axis deviation  Probable anteroseptal infarct, old When compared with ECG of 04/14/2014 No  significant change was found Confirmed by [facility]  MD, Nunzio Cory [phone])  on 06/30/2014 1:30:52 PM      MDM  MDM Reviewed: previous chart, nursing note and vitals Reviewed previous: labs and ECG Interpretation: labs, CT scan and ECG     Results for orders placed or performed during the hospital encounter of 06/30/14  CBC with Differential  Result Value Ref Range   WBC 7.2 4.0 - 10.5 K/uL   RBC 4.57 3.87 - 5.11 MIL/uL   Hemoglobin 12.3 12.0 - 15.0 g/dL   HCT 37.8 36.0 - 46.0 %   MCV 82.7 78.0 - 100.0 fL   MCH 26.9 26.0 - 34.0 pg   MCHC  32.5 30.0 - 36.0 g/dL   RDW 18.6 (H) 11.5 - 15.5 %   Platelets 205 150 - 400 K/uL   Neutrophils Relative % 71 43 - 77 %   Neutro Abs 5.1 1.7 - 7.7 K/uL   Lymphocytes Relative 18 12 - 46 %   Lymphs Abs 1.3 0.7 - 4.0 K/uL   Monocytes Relative 8 3 - 12 %   Monocytes Absolute 0.6 0.1 - 1.0 K/uL   Eosinophils Relative 2 0 - 5 %   Eosinophils Absolute 0.2 0.0 - 0.7 K/uL   Basophils Relative 1 0 - 1 %   Basophils Absolute 0.1 0.0 - 0.1 K/uL  Basic metabolic panel  Result Value Ref Range   Sodium 138 135 - 145 mmol/L   Potassium 3.3 (L) 3.5 - 5.1 mmol/L   Chloride 96 96 - 112 mEq/L   CO2 36 (H) 19 - 32 mmol/L   Glucose, Bld 126 (H) 70 - 99 mg/dL   BUN 15 6 - 23 mg/dL   Creatinine, Ser 0.72 0.50 - 1.10 mg/dL   Calcium 8.7 8.4 - 10.5 mg/dL   GFR calc non Af Amer 81 (L) >90 mL/min   GFR calc Af Amer >90 >90 mL/min    Anion gap 6 5 - 15  Urinalysis, Routine w reflex microscopic  Result Value Ref Range   Color, Urine YELLOW YELLOW   APPearance TURBID (A) CLEAR   Specific Gravity, Urine 1.015 1.005 - 1.030   pH 7.5 5.0 - 8.0   Glucose, UA NEGATIVE NEGATIVE mg/dL   Hgb urine dipstick LARGE (A) NEGATIVE   Bilirubin Urine NEGATIVE NEGATIVE   Ketones, ur NEGATIVE NEGATIVE mg/dL   Protein, ur NEGATIVE NEGATIVE mg/dL   Urobilinogen, UA 2.0 (H) 0.0 - 1.0 mg/dL   Nitrite NEGATIVE NEGATIVE   Leukocytes, UA NEGATIVE NEGATIVE  Lipase, blood  Result Value Ref Range   Lipase 15 11 - 59 U/L  Lactic acid, plasma  Result Value Ref Range   Lactic Acid, Venous 1.1 0.5 - 2.2 mmol/L  Hepatic function panel  Result Value Ref Range   Total Protein 6.1 6.0 - 8.3 g/dL   Albumin 3.6 3.5 - 5.2 g/dL   AST 23 0 - 37 U/L   ALT 16 0 - 35 U/L   Alkaline Phosphatase 77 39 - 117 U/L   Total Bilirubin 0.8 0.3 - 1.2 mg/dL   Bilirubin, Direct 0.1 0.0 - 0.3 mg/dL   Indirect Bilirubin 0.7 0.3 - 0.9 mg/dL  Troponin I  Result Value Ref Range   Troponin I <0.03 <0.031 ng/mL  Urine microscopic-add on  Result Value Ref Range   Squamous Epithelial / LPF FEW (A) RARE   RBC / HPF 3-6 <3 RBC/hpf   Ct Abdomen Pelvis W Contrast 06/30/2014   CLINICAL DATA:  Gastrointestinal bleeding for 1 day. Hypertension. COPD.  EXAM: CT ABDOMEN AND PELVIS WITH CONTRAST  TECHNIQUE: Multidetector CT imaging of the abdomen and pelvis was performed using the standard protocol following bolus administration of intravenous contrast.  CONTRAST:  179mL OMNIPAQUE IOHEXOL 300 MG/ML  SOLN  COMPARISON:  08/07/2006  FINDINGS: Lower chest:  Mild peripheral interstitial accentuation.  Hepatobiliary: Mild intrahepatic biliary dilatation with common bile duct measuring 6 mm, within normal limits.  Pancreas: Mildly prominent dorsal pancreatic duct bite would secondary to pancreas divisum.  Spleen: Unremarkable  Adrenals/Urinary Tract: 2.9 cm right kidney upper pole Bosniak  category 1 cyst. 0.9 cm right kidney lower pole Bosniak category 1 cyst. Mild scarring  in several tiny likely cysts in the left kidney.  Stomach/Bowel: Prominence of stool throughout much of the colon. Wall thickening and poor definition of fat planes in the lower rectum and anal region. There also questionable focal areas of wall thickening in the ascending colon, for example posteriorly on image 33 of series 2 if and also on image 39 of series 2.  Vascular/Lymphatic: Aortoiliac atherosclerotic vascular disease. Fusiform infrarenal abdominal aortic aneurysm, 3.6 cm diameter.  Reproductive: Uterus absent.  Ovaries not well seen  Other: No supplemental non-categorized findings.  Musculoskeletal: Right unilateral chronic pars defect at L5 with mild grade 1 anterolisthesis. Fatty density in the subcutaneous tissues anterior to the stomach may represent a very small ventral hernia or less likely a lipoma.  IMPRESSION: 1. Considerable abnormal wall thickening in the distal rectum and anal region, with adjacent low grade stranding in the perirectal space, suspicious for inflammatory process, less likely to be tumor. 2. Several areas of potential wall thickening in the ascending colon -tumor is not excluded and colonoscopy may be warranted if not recently performed once the patient's acute symptoms are effectively treated. 3. Pancreas divisum. 4. Fusiform infrarenal abdominal aortic aneurysm, 3.6 cm in diameter. Recommend followup by [place] in 2 years. This recommendation follows ACR consensus guidelines: White Paper of the ACR Incidental Findings Committee II on Vascular Findings. J Am Coll Radiol 2013; 10:789-794. 5. Small left eccentric ventral hernia containing adipose tissue.   Electronically Signed   By: Sherryl Barters M.D.   On: 06/30/2014 15:06    1520:  H/H stable. No BM while in the ED. No active GIB while in the ED. Abd remains benign, VSS. Pt talking easily with family at bedside, resps easy, NAD. Will dose IV  cipro and flagyl for colitis. Dx and testing d/w pt and family.  Questions answered.  Verb understanding, agreeable to admit. T/C to [facility]. Anastasio Champion, case discussed, including:  HPI, pertinent PM/SHx, VS/PE, dx testing, ED course and treatment:  Agreeable to admit, requests to write temporary orders, obtain medical bed to team APAdmits.   Francine Graven, DO 07/02/14 1039

## 2014-07-01 DIAGNOSIS — J410 Simple chronic bronchitis: Secondary | ICD-10-CM

## 2014-07-01 DIAGNOSIS — I480 Paroxysmal atrial fibrillation: Secondary | ICD-10-CM

## 2014-07-01 DIAGNOSIS — K529 Noninfective gastroenteritis and colitis, unspecified: Secondary | ICD-10-CM

## 2014-07-01 LAB — CBC
HCT: 32.5 % — ABNORMAL LOW (ref 36.0–46.0)
Hemoglobin: 10.6 g/dL — ABNORMAL LOW (ref 12.0–15.0)
MCH: 26.9 pg (ref 26.0–34.0)
MCHC: 32.6 g/dL (ref 30.0–36.0)
MCV: 82.5 fL (ref 78.0–100.0)
PLATELETS: 157 10*3/uL (ref 150–400)
RBC: 3.94 MIL/uL (ref 3.87–5.11)
RDW: 18.8 % — ABNORMAL HIGH (ref 11.5–15.5)
WBC: 5 10*3/uL (ref 4.0–10.5)

## 2014-07-01 LAB — MAGNESIUM: Magnesium: 1.6 mg/dL (ref 1.5–2.5)

## 2014-07-01 LAB — COMPREHENSIVE METABOLIC PANEL
ALT: 13 U/L (ref 0–35)
AST: 19 U/L (ref 0–37)
Albumin: 3.1 g/dL — ABNORMAL LOW (ref 3.5–5.2)
Alkaline Phosphatase: 62 U/L (ref 39–117)
Anion gap: 6 (ref 5–15)
BUN: 11 mg/dL (ref 6–23)
CALCIUM: 8.1 mg/dL — AB (ref 8.4–10.5)
CO2: 29 mmol/L (ref 19–32)
CREATININE: 0.56 mg/dL (ref 0.50–1.10)
Chloride: 101 mEq/L (ref 96–112)
GFR calc Af Amer: >90 mL/min (ref >90)
GFR, EST NON AFRICAN AMERICAN: 88 mL/min — AB (ref >90)
Glucose, Bld: 124 mg/dL — ABNORMAL HIGH (ref 70–99)
Potassium: 3.1 mmol/L — ABNORMAL LOW (ref 3.5–5.1)
Sodium: 136 mmol/L (ref 135–145)
Total Bilirubin: 0.7 mg/dL (ref 0.3–1.2)
Total Protein: 5.2 g/dL — ABNORMAL LOW (ref 6.0–8.3)

## 2014-07-01 LAB — CLOSTRIDIUM DIFFICILE BY PCR: CDIFFPCR: NEGATIVE

## 2014-07-01 MED ORDER — POTASSIUM CHLORIDE CRYS ER 20 MEQ PO TBCR
40.0000 meq | EXTENDED_RELEASE_TABLET | ORAL | Status: AC
  Administered 2014-07-01 (×2): 40 meq via ORAL
  Filled 2014-07-01 (×2): qty 2

## 2014-07-01 NOTE — Consult Note (Signed)
Referring Provider: No ref. provider found Primary Care Physician:  Glo Herring., MD Primary Gastroenterologist:  Barney Drain  Reason for Consultation:  RECTAL BLEEDING  Impression: ADMITTED WITH ACUTE ONSET OF LOOSE STOOLS AND BRBPR LIKELY DUE TO INFECTION OR CROHN'S COLITIS AND BLEEDING EXACERBATED BY PRADAXA CAUSING ?ADDITIONAL INJURY FROM ISCHEMIA, LESS LIKELY DIVERTICULAR BLEED. ALSO C/O SOLID DYSPHAGIA/UNINETNTIONAL WEIGHT LOSS- LIKELY DUE TO STRICTURE/WEB, LESS LIKELY ESOPHAGEAL CANCER.  Plan: 1. PT DESIRES TO HAVE TCS/EGD/DIL AS OUTPATIENT JAN 5. DISCUSSED WITH PT IF SHE CONTINUES TO BLEED SHE NEEDS ENDOSCOPY BEFORE DISCHARGE. DISCUSSED PROCEDURE, BENEFITS, & RISKS: < 1% chance of medication reaction, bleeding, perforation, or rupture of spleen/liver. 2. SUPPORTIVE CARE 3. HOLD PRADAXA 4. FULL LIQUID DIET   HPI:  WED WENT TO URINATE AND HAD A LARGE BLOODY BOWEL MOVEMENT (BROWN/SLUDGE/BABY POOP) 5-10 TIMES DURING ADAY AND OFF AND OB DURING THE NIGHT. THEN ON THUR AM STARTED TO HAVE PAIN IN HER RECTUM. NO ABD PAIN. CHANGE IN BOWEL HABITS-NL FORMED/BROWN. RARE PROBLEM SWALLOWING-LIQUIDS/SOLIDS. EATS SLOW. LOSING WEIGHT: 40-50 LBS UNINTENTIONAL. AUG 2015: SICK. BURPS AFTER PRADAXA. NO ASPIRIN, BC/GOODY POWDERS, IBUPROFEN/MOTRIN, OR NAPROXEN/ALEVE. NO ETOH. NO CIGS SINCE AUG 2015. LAST TCS: NEVER. LAST PRADAXA WED AM. LAST BRBPR:  2 HRS AGO.  PT DENIES FEVER, CHILLS, HEMATOCHEZIA, HEMATEMESIS, nausea, vomiting, melena, CHEST PAIN, SHORTNESS OF BREATH,  constipation, abdominal pain, OR heartburn or indigestion.   Past Medical History  Diagnosis Date  . Hypertension   . GERD (gastroesophageal reflux disease)   . Cerebrovascular disease   . PAD (peripheral artery disease)     lower extremity PAD with bilateral leg stenting  . TIA (transient ischemic attack)   . COPD (chronic obstructive pulmonary disease)   . CVA 10/25/2008    Qualifier: Diagnosis of  By: [name], CNA, Christy     . Internal carotid artery stenosis 05/13/2012    HIGH GRADE LEFT INTERNAL CAROTID STENOSIS.  S/p  L CEA 05/11/12  . Irregular heart beat   . DVT (deep venous thrombosis)    Past Surgical History  Procedure Laterality Date  . Appendectomy    . Partial hysterectomy    . Gallbladder surgery    . Cholecystectomy    . Endarterectomy  05/11/2012    Procedure: ENDARTERECTOMY CAROTID;  Surgeon: [name], MD;  Location: [place];  Service: Vascular;  Laterality: Left;  . Patch angioplasty  05/11/2012    Procedure: PATCH ANGIOPLASTY;  Surgeon: [name], MD;  Location: [facility] OR;  Service: Vascular;  Laterality: Left;  with Dacron Patch Angioplasty  . Carotid endarterectomy    . Carotid angiogram N/A 05/06/2012    Procedure: CAROTID ANGIOGRAM;  Surgeon: [name], MD;  Location: [facility] CATH LAB;  Service: Cardiovascular;  Laterality: N/A;    Prior to Admission medications   Medication Sig Start Date End Date Taking? Authorizing Provider  budesonide-formoterol (SYMBICORT) 160-4.5 MCG/ACT inhaler Inhale 2 puffs into the lungs 2 (two) times daily.   Yes Historical Provider, MD  cilostazol (PLETAL) 100 MG tablet Take 100 mg by mouth daily.    Yes Historical Provider, MD  Cyanocobalamin (B-12) 2500 MCG SUBL Place 2,500 mcg under the tongue.   Yes Historical Provider, MD  dabigatran (PRADAXA) 150 MG CAPS Take 1 capsule (150 mg total) by mouth every 12 (twelve) hours. 07/14/12  Yes Minus Breeding, MD  hydrochlorothiazide (MICROZIDE) 12.5 MG capsule Take 12.5 mg by mouth daily.   Yes Historical Provider, MD  potassium chloride SA (K-DUR,KLOR-CON) 20 MEQ tablet Take 20  mEq by mouth daily.   Yes Historical Provider, MD  tiotropium (SPIRIVA) 18 MCG inhalation capsule Place 18 mcg into inhaler and inhale at bedtime.   Yes Historical Provider, MD  verapamil (CALAN-SR) 240 MG CR tablet Take 240 mg by mouth daily. Takes at lunch time   Yes Historical Provider, MD  levalbuterol Penne Lash) 0.63 MG/3ML  nebulizer solution Take 3 mLs (0.63 mg total) by nebulization every 4 (four) hours. 04/18/14   Erline Hau, MD  potassium chloride SA (K-DUR,KLOR-CON) 20 MEQ tablet Take 1 tablet (20 mEq total) by mouth 2 (two) times daily. Patient not taking: Reported on 06/30/2014 04/18/14   Erline Hau, MD    Current Facility-Administered Medications  Medication Dose Route Frequency Provider Last Rate Last Dose  . 0.9 %  sodium chloride infusion   Intravenous STAT Francine Graven, DO      . 0.9 %  sodium chloride infusion   Intravenous Continuous Doree Albee, MD 100 mL/hr at 07/01/14 0451    . albuterol (PROVENTIL) (2.5 MG/3ML) 0.083% nebulizer solution 2.5 mg  2.5 mg Nebulization TID Leida Lauth, RRT   2.5 mg at 07/01/14 0715  . budesonide-formoterol (SYMBICORT) 160-4.5 MCG/ACT inhaler 2 puff  2 puff Inhalation BID Doree Albee, MD   2 puff at 07/01/14 0715  . ciprofloxacin (CIPRO) IVPB 400 mg  400 mg Intravenous Q12H Nimish C Anastasio Champion, MD   400 mg at 07/01/14 0450  . hydrochlorothiazide (MICROZIDE) capsule 12.5 mg  12.5 mg Oral Daily Nimish C Anastasio Champion, MD   12.5 mg at 07/01/14 0841  . metroNIDAZOLE (FLAGYL) IVPB 500 mg  500 mg Intravenous Q8H Nimish C Gosrani, MD   500 mg at 07/01/14 0841  . ondansetron (ZOFRAN) tablet 4 mg  4 mg Oral Q6H PRN Nimish Luther Parody, MD       Or  . ondansetron (ZOFRAN) injection 4 mg  4 mg Intravenous Q6H PRN Nimish C Gosrani, MD      . potassium chloride SA (K-DUR,KLOR-CON) CR tablet 20 mEq  20 mEq Oral Daily Doree Albee, MD   20 mEq at 07/01/14 0841  . tiotropium (SPIRIVA) inhalation capsule 18 mcg  18 mcg Inhalation QHS Doree Albee, MD   18 mcg at 06/30/14 1950  . verapamil (CALAN-SR) CR tablet 240 mg  240 mg Oral Daily Doree Albee, MD   240 mg at 07/01/14 0841    Allergies as of 06/30/2014 - Review Complete 06/30/2014  Allergen Reaction Noted  . Ibuprofen    . Pnu-imune [pneumococcal polysaccharide vaccine]   06/30/2014  . Albuterol  06/04/2010  . Amoxicillin Hives   . Cephalexin Hives   . Codeine    . Entex pac [pseudoephedrine-gg & dm]  04/23/2012  . Etanercept    . Fluticasone-salmeterol    . Gualenic acid  04/23/2012  . Lorazepam    . Nubain [nalbuphine hcl]  04/23/2012  . Oxycodone hcl Nausea And Vomiting   . Oxycodone-acetaminophen Nausea And Vomiting   . Roxicodone [oxycodone hcl]  04/13/2014  . Tylox [oxycodone-acetaminophen]  04/13/2014    Family History  Problem Relation Age of Onset  . Deep vein thrombosis Sister     Blood Clot in leg   NO FAMILY HISTORY OF IBD, COLON CA/POLYPS.  History   Social History  . Marital Status: Married    Spouse Name: N/A    Number of Children: N/A  . Years of Education: N/A   Occupational History  . unemployed  Social History Main Topics  . Smoking status: Former Smoker -- 1.00 packs/day  . Smokeless tobacco: Current User  . Alcohol Use: No  . Drug Use: No  . Sexual Activity: No   Other Topics Concern  . Not on file   Social History Narrative   Review of Systems: PER HPI OTHERWISE ALL SYSTEMS ARE NEGATIVE.  Vitals: Blood pressure 106/63, pulse 83, temperature 97.8 F (36.6 C), temperature source Oral, resp. rate 20, height 5\' 4"  (1.626 m), weight 95 lb (43.092 kg), SpO2 100 %.  Physical Exam: General:   Alert,  Well-developed, well-nourished, pleasant and cooperative in NAD Head:  Normocephalic and atraumatic. Eyes:  Sclera clear, no icterus.   Conjunctiva pink. Mouth:  No lesions, dentition normal. Neck:  Supple; no masses. Lungs:  Clear throughout to auscultation.   No wheezes. No acute distress. Heart:  Regular rate and rhythm; no murmurs, clicks, rubs,  or gallops. Abdomen:  Soft, nontender and nondistended. No masses noted. Normal bowel sounds, without guarding, and without rebound.   Msk:  Symmetrical.  Extremities:  Without edema. Neurologic:  Alert and  oriented x4;  NO  NEW FOCAL DEFICITS Cervical Nodes:   No significant cervical adenopathy. Psych:  Alert and cooperative. Normal mood and affect.  Lab Results:  Recent Labs  06/30/14 1307 07/01/14 0600  WBC 7.2 5.0  HGB 12.3 10.6*  HCT 37.8 32.5*  PLT 205 157   BMET  Recent Labs  06/30/14 1307 07/01/14 0600  NA 138 136  K 3.3* 3.1*  CL 96 101  CO2 36* 29  GLUCOSE 126* 124*  BUN 15 11  CREATININE 0.72 0.56  CALCIUM 8.7 8.1*   LFT  Recent Labs  06/30/14 1307 07/01/14 0600  PROT 6.1 5.2*  ALBUMIN 3.6 3.1*  AST 23 19  ALT 16 13  ALKPHOS 77 62  BILITOT 0.8 0.7  BILIDIR 0.1  --   IBILI 0.7  --      Studies/Results: CT ABD/PELVIS-COLITIS IN THE ASCENDING AND SIGMOID COLON, AND RECTUM.   LOS: 1 day   Brynlie Daza  07/01/2014, 10:33 AM

## 2014-07-01 NOTE — Progress Notes (Signed)
[facility] PROGRESS NOTE  Caroline [name] MVE:[id number] DOB: May 17, 1935 DOA: 06/30/2014 PCP: Glo Herring., MD  Assessment/Plan: Colitis/Rectal Bleeding -Likely infectious; less likely ischemic. -Agree with holding pradaxa. -Patient has discussed with GI her preference of having a colonoscopy as an OP. -If has more bleeding, may need to do as an inpatient. -Continue cipro/flagyl.  Acute Blood Loss Anemia -Hb 10.6. -No indication for transfusion. -recheck CBC in am.  Hypokalemia -Replete PO. -Check Mag.  Atrial Fibrillation -Rate controlled. -Holding pradaxa for now.  COPD -Stable, no SOB.  Code Status: Full Code Family Communication: Patient only  Disposition Plan: To be dtermined   Consultants:  GI, Dr. Oneida Alar   Antibiotics:  Cipro  Flagyl   Subjective: No complaints.  Objective: Filed Vitals:   06/30/14 2251 07/01/14 0633 07/01/14 0715 07/01/14 1429  BP: 104/59 106/63    Pulse: 80 83    Temp: 97.7 F (36.5 C) 97.8 F (36.6 C)    TempSrc: Oral Oral    Resp: 18 20    Height:      Weight:      SpO2: 97% 100% 100% 98%    Intake/Output Summary (Last 24 hours) at 07/01/14 1525 Last data filed at 07/01/14 0655  Gross per 24 hour  Intake   1100 ml  Output    201 ml  Net    899 ml   Filed Weights   06/30/14 1204 06/30/14 1732  Weight: 43.092 kg (95 lb) 43.092 kg (95 lb)    Exam:   General:  AA Ox3  Cardiovascular: RRR  Respiratory: CTA B  Abdomen: S/ND/+BS  Extremities: no C/C/E   Neurologic:  Non-focal  Data Reviewed: Basic Metabolic Panel:  Recent Labs Lab 06/30/14 1307 07/01/14 0600  NA 138 136  K 3.3* 3.1*  CL 96 101  CO2 36* 29  GLUCOSE 126* 124*  BUN 15 11  CREATININE 0.72 0.56  CALCIUM 8.7 8.1*   Liver Function Tests:  Recent Labs Lab 06/30/14 1307 07/01/14 0600  AST 23 19  ALT 16 13  ALKPHOS 77 62  BILITOT 0.8 0.7  PROT 6.1 5.2*  ALBUMIN 3.6 3.1*    Recent Labs Lab 06/30/14 1307   LIPASE 15   No results for input(s): AMMONIA in the last 168 hours. CBC:  Recent Labs Lab 06/30/14 1307 07/01/14 0600  WBC 7.2 5.0  NEUTROABS 5.1  --   HGB 12.3 10.6*  HCT 37.8 32.5*  MCV 82.7 82.5  PLT 205 157   Cardiac Enzymes:  Recent Labs Lab 06/30/14 1307  TROPONINI <0.03   BNP (last 3 results)  Recent Labs  04/14/14 0019  PROBNP 412.3   CBG: No results for input(s): GLUCAP in the last 168 hours.  Recent Results (from the past 240 hour(s))  Clostridium Difficile by PCR     Status: None   Collection Time: 07/01/14  5:14 AM  Result Value Ref Range Status   C difficile by pcr NEGATIVE NEGATIVE Final     Studies: Ct Abdomen Pelvis W Contrast  06/30/2014   CLINICAL DATA:  Gastrointestinal bleeding for 1 day. Hypertension. COPD.  EXAM: CT ABDOMEN AND PELVIS WITH CONTRAST  TECHNIQUE: Multidetector CT imaging of the abdomen and pelvis was performed using the standard protocol following bolus administration of intravenous contrast.  CONTRAST:  174mL OMNIPAQUE IOHEXOL 300 MG/ML  SOLN  COMPARISON:  08/07/2006  FINDINGS: Lower chest:  Mild peripheral interstitial accentuation.  Hepatobiliary: Mild intrahepatic biliary dilatation with common bile duct measuring  6 mm, within normal limits.  Pancreas: Mildly prominent dorsal pancreatic duct bite would secondary to pancreas divisum.  Spleen: Unremarkable  Adrenals/Urinary Tract: 2.9 cm right kidney upper pole Bosniak category 1 cyst. 0.9 cm right kidney lower pole Bosniak category 1 cyst. Mild scarring in several tiny likely cysts in the left kidney.  Stomach/Bowel: Prominence of stool throughout much of the colon. Wall thickening and poor definition of fat planes in the lower rectum and anal region. There also questionable focal areas of wall thickening in the ascending colon, for example posteriorly on image 33 of series 2 if and also on image 39 of series 2.  Vascular/Lymphatic: Aortoiliac atherosclerotic vascular disease.  Fusiform infrarenal abdominal aortic aneurysm, 3.6 cm diameter.  Reproductive: Uterus absent.  Ovaries not well seen  Other: No supplemental non-categorized findings.  Musculoskeletal: Right unilateral chronic pars defect at L5 with mild grade 1 anterolisthesis. Fatty density in the subcutaneous tissues anterior to the stomach may represent a very small ventral hernia or less likely a lipoma.  IMPRESSION: 1. Considerable abnormal wall thickening in the distal rectum and anal region, with adjacent low grade stranding in the perirectal space, suspicious for inflammatory process, less likely to be tumor. 2. Several areas of potential wall thickening in the ascending colon -tumor is not excluded and colonoscopy may be warranted if not recently performed once the patient's acute symptoms are effectively treated. 3. Pancreas divisum. 4. Fusiform infrarenal abdominal aortic aneurysm, 3.6 cm in diameter. Recommend followup by [place] in 2 years. This recommendation follows ACR consensus guidelines: White Paper of the ACR Incidental Findings Committee II on Vascular Findings. J Am Coll Radiol 2013; 10:789-794. 5. Small left eccentric ventral hernia containing adipose tissue.   Electronically Signed   By: Sherryl Barters M.D.   On: 06/30/2014 15:06    Scheduled Meds: . sodium chloride   Intravenous STAT  . albuterol  2.5 mg Nebulization TID  . budesonide-formoterol  2 puff Inhalation BID  . ciprofloxacin  400 mg Intravenous Q12H  . hydrochlorothiazide  12.5 mg Oral Daily  . metronidazole  500 mg Intravenous Q8H  . potassium chloride SA  20 mEq Oral Daily  . tiotropium  18 mcg Inhalation QHS  . verapamil  240 mg Oral Daily   Continuous Infusions: . sodium chloride 100 mL/hr at 07/01/14 0451    Principal Problem:   Colitis Active Problems:   Rectal bleeding   FIBRILLATION, ATRIAL   COPD (chronic obstructive pulmonary disease)    Time spent: 25 minutes. Greater than 50% of this time was spent in direct  contact with the patient coordinating care.    Lelon Frohlich  [facility] Pager [phone]  If 7PM-7AM, please contact night-coverage at [email or web address], password [facility] 07/01/2014, 3:25 PM  LOS: 1 day

## 2014-07-02 DIAGNOSIS — J438 Other emphysema: Secondary | ICD-10-CM

## 2014-07-02 LAB — CBC
HCT: 36.7 % (ref 36.0–46.0)
HEMOGLOBIN: 11.8 g/dL — AB (ref 12.0–15.0)
MCH: 27 pg (ref 26.0–34.0)
MCHC: 32.2 g/dL (ref 30.0–36.0)
MCV: 84 fL (ref 78.0–100.0)
Platelets: 168 10*3/uL (ref 150–400)
RBC: 4.37 MIL/uL (ref 3.87–5.11)
RDW: 19.3 % — ABNORMAL HIGH (ref 11.5–15.5)
WBC: 6.2 10*3/uL (ref 4.0–10.5)

## 2014-07-02 LAB — BASIC METABOLIC PANEL
Anion gap: 5 (ref 5–15)
BUN: 7 mg/dL (ref 6–23)
CHLORIDE: 103 meq/L (ref 96–112)
CO2: 32 mmol/L (ref 19–32)
Calcium: 8.7 mg/dL (ref 8.4–10.5)
Creatinine, Ser: 0.53 mg/dL (ref 0.50–1.10)
GFR calc Af Amer: >90 mL/min (ref >90)
GFR calc non Af Amer: 89 mL/min — ABNORMAL LOW (ref >90)
Glucose, Bld: 99 mg/dL (ref 70–99)
POTASSIUM: 5 mmol/L (ref 3.5–5.1)
SODIUM: 140 mmol/L (ref 135–145)

## 2014-07-02 MED ORDER — CIPROFLOXACIN HCL 250 MG PO TABS: 250.0000 mg | ORAL_TABLET | Freq: Two times a day (BID) | ORAL | Status: DC

## 2014-07-02 MED ORDER — METRONIDAZOLE 500 MG PO TABS: 500.0000 mg | ORAL_TABLET | Freq: Three times a day (TID) | ORAL | Status: DC

## 2014-07-02 NOTE — Discharge Instructions (Signed)
Stop taking Pradaxa until after you see Dr. Oneida Alar.

## 2014-07-02 NOTE — Discharge Summary (Signed)
Physician Discharge Summary  Caroline [name] GBT:[id number] DOB: 12/18/1934 DOA: 06/30/2014  PCP: Glo Herring., MD  Admit date: 06/30/2014 Discharge date: 07/02/2014  Time spent: 45 minutes  Recommendations for Outpatient Follow-up:  -Will be discharged home today. -Advised to follow up with Dr. Oneida Alar as scheduled in January for endoscopic procedures.   Discharge Diagnoses:  Principal Problem:   Colitis Active Problems:   Rectal bleeding   FIBRILLATION, ATRIAL   COPD (chronic obstructive pulmonary disease)   Discharge Condition: Stable and improved  Filed Weights   06/30/14 1204 06/30/14 1732  Weight: 43.092 kg (95 lb) 43.092 kg (95 lb)    History of present illness:  This is a 79 year old lady who started to have rectal bleeding yesterday around midday. It was largely painless rectal bleeding without any associated abdominal pain except for some anal pain. There was no nausea or vomiting. Initially it was rectal bleeding alone and then today it was mixed in with stool. There is been no dark stools/black stools. There is no hematemesis. There is no fever. This lady has atrial fibrillation and is on chronic anticoagulation with Pradaxa, and has been on this for the last 2 years or so and prior to this was on warfarin. Evaluation in the emergency room shows an abnormal CT scan, possibly suggestive of infection or inflammatory process. She is now being admitted for further management.  Hospital Course:   Colitis/Rectal Bleeding -Likely infectious; less likely ischemic. -Agree with holding pradaxa until after endoscopic procedures in early January. -Patient has discussed with GI her preference of having a colonoscopy as an OP. -No further bleeding while in the hospital. -Continue cipro/flagyl for 2 weeks.  Acute Blood Loss Anemia -Hb 11.8 on DC. -No indication for transfusion.  Hypokalemia -Repleted. -Mg 1.6.  Atrial Fibrillation -Rate controlled. -Holding  pradaxa for now.  COPD -Stable, no SOB.  Procedures:  None   Consultations:  GI, Dr. Oneida Alar  Discharge Instructions  Discharge Instructions    Increase activity slowly    Complete by:  As directed             Medication List    STOP taking these medications        dabigatran 150 MG Caps capsule  Commonly known as:  PRADAXA      TAKE these medications        B-12 2500 MCG Subl  Place 2,500 mcg under the tongue.     budesonide-formoterol 160-4.5 MCG/ACT inhaler  Commonly known as:  SYMBICORT  Inhale 2 puffs into the lungs 2 (two) times daily.     cilostazol 100 MG tablet  Commonly known as:  PLETAL  Take 100 mg by mouth daily.     ciprofloxacin 250 MG tablet  Commonly known as:  CIPRO  Take 1 tablet (250 mg total) by mouth 2 (two) times daily.     hydrochlorothiazide 12.5 MG capsule  Commonly known as:  MICROZIDE  Take 12.5 mg by mouth daily.     levalbuterol 0.63 MG/3ML nebulizer solution  Commonly known as:  XOPENEX  Take 3 mLs (0.63 mg total) by nebulization every 4 (four) hours.     metroNIDAZOLE 500 MG tablet  Commonly known as:  FLAGYL  Take 1 tablet (500 mg total) by mouth 3 (three) times daily.     potassium chloride SA 20 MEQ tablet  Commonly known as:  K-DUR,KLOR-CON  Take 20 mEq by mouth daily.     tiotropium 18 MCG inhalation capsule  Commonly  known as:  SPIRIVA  Place 18 mcg into inhaler and inhale at bedtime.     verapamil 240 MG CR tablet  Commonly known as:  CALAN-SR  Take 240 mg by mouth daily. Takes at lunch time       Allergies  Allergen Reactions  . Ibuprofen     Gi upset  . Pnu-Imune [Pneumococcal Polysaccharide Vaccine]     Huge knot at site and rash  . Albuterol     Confusion and shortness of breath  . Amoxicillin Hives  . Cephalexin Hives  . Codeine     confusion  . Entex Pac [Pseudoephedrine-Gg & Dm]   . Etanercept     vertigo  . Fluticasone-Salmeterol     Confusion and shortness of breath  . Gualenic  Acid   . Lorazepam     hallucinations  . Nubain [Nalbuphine Hcl]   . Oxycodone Hcl Nausea And Vomiting  . Oxycodone-Acetaminophen Nausea And Vomiting  . Roxicodone [Oxycodone Hcl]   . Tylox [Oxycodone-Acetaminophen]        Follow-up Information    Follow up with Glo Herring., MD. Schedule an appointment as soon as possible for a visit in 2 weeks.   Specialty:  Internal Medicine   Contact information:   [address] [place] [address] [phone]       Follow up with Barney Drain, MD.   Specialty:  Gastroenterology   Why:  as scheduled   Contact information:   [place] [phone] [place]  [phone]        The results of significant diagnostics from this hospitalization (including imaging, microbiology, ancillary and laboratory) are listed below for reference.    Significant Diagnostic Studies: Ct Abdomen Pelvis W Contrast  06/30/2014   CLINICAL DATA:  Gastrointestinal bleeding for 1 day. Hypertension. COPD.  EXAM: CT ABDOMEN AND PELVIS WITH CONTRAST  TECHNIQUE: Multidetector CT imaging of the abdomen and pelvis was performed using the standard protocol following bolus administration of intravenous contrast.  CONTRAST:  142mL OMNIPAQUE IOHEXOL 300 MG/ML  SOLN  COMPARISON:  08/07/2006  FINDINGS: Lower chest:  Mild peripheral interstitial accentuation.  Hepatobiliary: Mild intrahepatic biliary dilatation with common bile duct measuring 6 mm, within normal limits.  Pancreas: Mildly prominent dorsal pancreatic duct bite would secondary to pancreas divisum.  Spleen: Unremarkable  Adrenals/Urinary Tract: 2.9 cm right kidney upper pole Bosniak category 1 cyst. 0.9 cm right kidney lower pole Bosniak category 1 cyst. Mild scarring in several tiny likely cysts in the left kidney.  Stomach/Bowel: Prominence of stool throughout much of the colon. Wall thickening and poor definition of fat planes in the lower rectum and anal region.  There also questionable focal areas of wall thickening in the ascending colon, for example posteriorly on image 33 of series 2 if and also on image 39 of series 2.  Vascular/Lymphatic: Aortoiliac atherosclerotic vascular disease. Fusiform infrarenal abdominal aortic aneurysm, 3.6 cm diameter.  Reproductive: Uterus absent.  Ovaries not well seen  Other: No supplemental non-categorized findings.  Musculoskeletal: Right unilateral chronic pars defect at L5 with mild grade 1 anterolisthesis. Fatty density in the subcutaneous tissues anterior to the stomach may represent a very small ventral hernia or less likely a lipoma.  IMPRESSION: 1. Considerable abnormal wall thickening in the distal rectum and anal region, with adjacent low grade stranding in the perirectal space, suspicious for inflammatory process, less likely to be tumor. 2. Several areas of potential wall thickening in the ascending colon -  tumor is not excluded and colonoscopy may be warranted if not recently performed once the patient's acute symptoms are effectively treated. 3. Pancreas divisum. 4. Fusiform infrarenal abdominal aortic aneurysm, 3.6 cm in diameter. Recommend followup by [place] in 2 years. This recommendation follows ACR consensus guidelines: White Paper of the ACR Incidental Findings Committee II on Vascular Findings. J Am Coll Radiol 2013; 10:789-794. 5. Small left eccentric ventral hernia containing adipose tissue.   Electronically Signed   By: Sherryl Barters M.D.   On: 06/30/2014 15:06    Microbiology: Recent Results (from the past 240 hour(s))  Clostridium Difficile by PCR     Status: None   Collection Time: 07/01/14  5:14 AM  Result Value Ref Range Status   C difficile by pcr NEGATIVE NEGATIVE Final     Labs: Basic Metabolic Panel:  Recent Labs Lab 06/30/14 1307 07/01/14 0600 07/02/14 0621  NA 138 136 140  K 3.3* 3.1* 5.0  CL 96 101 103  CO2 36* 29 32  GLUCOSE 126* 124* 99  BUN 15 11 7   CREATININE 0.72 0.56 0.53    CALCIUM 8.7 8.1* 8.7  MG  --  1.6  --    Liver Function Tests:  Recent Labs Lab 06/30/14 1307 07/01/14 0600  AST 23 19  ALT 16 13  ALKPHOS 77 62  BILITOT 0.8 0.7  PROT 6.1 5.2*  ALBUMIN 3.6 3.1*    Recent Labs Lab 06/30/14 1307  LIPASE 15   No results for input(s): AMMONIA in the last 168 hours. CBC:  Recent Labs Lab 06/30/14 1307 07/01/14 0600 07/02/14 0621  WBC 7.2 5.0 6.2  NEUTROABS 5.1  --   --   HGB 12.3 10.6* 11.8*  HCT 37.8 32.5* 36.7  MCV 82.7 82.5 84.0  PLT 205 157 168   Cardiac Enzymes:  Recent Labs Lab 06/30/14 1307  TROPONINI <0.03   BNP: BNP (last 3 results)  Recent Labs  04/14/14 0019  PROBNP 412.3   CBG: No results for input(s): GLUCAP in the last 168 hours.     SignedLelon Frohlich  [facility] Pager: [phone] 07/02/2014, 11:35 AM     \

## 2014-07-02 NOTE — Progress Notes (Signed)
Patient with orders to be discharge home. Discharge instructions given, patient and daughter verbalized understanding. Prescriptions given. Patient stable. Patient left with daughter in private vehicle.  

## 2014-07-02 NOTE — Progress Notes (Signed)
INITIAL NUTRITION ASSESSMENT  DOCUMENTATION CODES Per approved criteria  -Severe malnutrition in the context of chronic illness  Pt meets criteria for severe MALNUTRITION in the context of chronic illness as evidenced by unintentional weight loss 10% in < 3 months and severe fat mass depletion upper arms, orbital and thoracic regions.  INTERVENTION:  High calorie-protein diet reviewed with pt and daughter.   RD contact number provided.  NUTRITION DIAGNOSIS: Inadequate oral intake related to colitis and COPD as evidenced by severe fat mass depletion and weight loss 10% x  75 days .   Goal: Ms Raboin will tolerate full diet advancement to Soft diet to consume >90% of estimated nutrition needs.  Monitor:  Frequency of BM, diet advancement and percent of protein-energy consumed, weight changes  Reason for Assessment: Malnutrition Screen   79 y.o. female  Admitting Dx: Colitis  ASSESSMENT: Pt presents with colitis/Rectal bleeding. She has acute anemia (blood loss) and hypokalemia (repleted) with mag. Pending. Evaluated by GI and outpatient TCS/EGD/DIL pending. Possible stricture limiting oral intake? Pt says she is being discharged home today.  Pt and daughter deny changes in appetite and reports "grazes" throughout the day. She has a hx of COPD and is on chronic 02 at home. Weight loss over time (multipile factors) likely related to chronic disease. Pt lives with husband and is able to prepare meals for them. Daughter also helps prepare food for them. Pt doesn't drink milk, Ensure or Boost c/o associated constipation. Reviewed other ways to increase protein-energy intake daily. Encouraged her to rest before meals to maximize opportunity for caloric intake. Suggest adding peanut butter daily, greek yogurt , eggs etc.   Unplanned weight loss 5% in 60 days, 10% in 75 days (severe decrease), 14% in 12 months and 25% over past 2 years.  Nutrition focused exam: pt winded after walking to  bathroom. She has moderate-severe muscle loss clavicles, patellar, quadricep and temporal regions. Subcutaneous fat depletion (severe) to upper arms, orbital and thoracic areas. No edema noted to LE.  Height: Ht Readings from Last 1 Encounters:  06/30/14 5\' 4"  (1.626 m)    Weight: Wt Readings from Last 1 Encounters:  06/30/14 95 lb (43.092 kg)    Ideal Body Weight: 120#  % Ideal Body Weight: 79%  Wt Readings from Last 10 Encounters:  06/30/14 95 lb (43.092 kg)  05/04/14 100 lb (45.36 kg)  04/18/14 106 lb 4.8 oz (48.217 kg)  06/24/13 110 lb (49.896 kg)  11/04/12 114 lb (51.71 kg)  06/10/12 121 lb (54.885 kg)  05/28/12 119 lb (53.978 kg)  05/13/12 118 lb 2.7 oz (53.6 kg)  04/23/12 117 lb (53.071 kg)  07/10/11 126 lb 12.2 oz (57.5 kg)    Usual Body Weight: 110#   % Usual Body Weight: 86%  BMI:  Body mass index is 16.3 kg/(m^2). underweight  Estimated Nutritional Needs: Kcal: 1290-1505 Protein: 56-65 gr Fluid: 1.3-1.5 liters daily  Skin: no new issues noted  Diet Order: Diet full liquid  EDUCATION NEEDS: -No education needs identified at this time   Intake/Output Summary (Last 24 hours) at 07/02/14 1133 Last data filed at 07/02/14 0659  Gross per 24 hour  Intake 3903.33 ml  Output      0 ml  Net 3903.33 ml    Last BM: 12/25   Labs:   Recent Labs Lab 06/30/14 1307 07/01/14 0600 07/02/14 0621  NA 138 136 140  K 3.3* 3.1* 5.0  CL 96 101 103  CO2 36* 29 32  BUN 15 11 7   CREATININE 0.72 0.56 0.53  CALCIUM 8.7 8.1* 8.7  MG  --  1.6  --   GLUCOSE 126* 124* 99    CBG (last 3)  No results for input(s): GLUCAP in the last 72 hours.  Scheduled Meds: . albuterol  2.5 mg Nebulization TID  . budesonide-formoterol  2 puff Inhalation BID  . ciprofloxacin  400 mg Intravenous Q12H  . hydrochlorothiazide  12.5 mg Oral Daily  . metronidazole  500 mg Intravenous Q8H  . potassium chloride SA  20 mEq Oral Daily  . tiotropium  18 mcg Inhalation QHS  .  verapamil  240 mg Oral Daily    Continuous Infusions: . sodium chloride 100 mL/hr at 07/02/14 1610    Past Medical History  Diagnosis Date  . Hypertension   . GERD (gastroesophageal reflux disease)   . Cerebrovascular disease   . PAD (peripheral artery disease)     lower extremity PAD with bilateral leg stenting  . TIA (transient ischemic attack)   . COPD (chronic obstructive pulmonary disease)   . CVA 10/25/2008    Qualifier: Diagnosis of  By: [name], CNA, Christy    . Internal carotid artery stenosis 05/13/2012    HIGH GRADE LEFT INTERNAL CAROTID STENOSIS.  S/p  L CEA 05/11/12  . Irregular heart beat   . DVT (deep venous thrombosis)     Past Surgical History  Procedure Laterality Date  . Appendectomy    . Partial hysterectomy    . Gallbladder surgery    . Cholecystectomy    . Endarterectomy  05/11/2012    Procedure: ENDARTERECTOMY CAROTID;  Surgeon: [name], MD;  Location: [facility];  Service: Vascular;  Laterality: Left;  . Patch angioplasty  05/11/2012    Procedure: PATCH ANGIOPLASTY;  Surgeon: [name], MD;  Location: [facility] OR;  Service: Vascular;  Laterality: Left;  with Dacron Patch Angioplasty  . Carotid endarterectomy    . Carotid angiogram N/A 05/06/2012    Procedure: CAROTID ANGIOGRAM;  Surgeon: [name], MD;  Location: [facility] CATH LAB;  Service: Cardiovascular;  Laterality: N/A;    Colman Cater MS,RD,CSG,LDN Office: [phone] Pager: [phone]

## 2014-07-04 ENCOUNTER — Telehealth: Payer: Self-pay

## 2014-07-04 ENCOUNTER — Other Ambulatory Visit: Payer: Self-pay

## 2014-07-04 DIAGNOSIS — K529 Noninfective gastroenteritis and colitis, unspecified: Secondary | ICD-10-CM

## 2014-07-04 DIAGNOSIS — K625 Hemorrhage of anus and rectum: Secondary | ICD-10-CM

## 2014-07-04 LAB — GI PATHOGEN PANEL BY PCR, STOOL
C difficile toxin A/B: NEGATIVE
Campylobacter by PCR: NEGATIVE
Cryptosporidium by PCR: NEGATIVE
E COLI (ETEC) LT/ST: NEGATIVE
E COLI (STEC): NEGATIVE
E COLI 0157 BY PCR: NEGATIVE
G lamblia by PCR: NEGATIVE
NOROVIRUS G1/G2: NEGATIVE
Rotavirus A by PCR: NEGATIVE
SHIGELLA BY PCR: NEGATIVE
Salmonella by PCR: NEGATIVE

## 2014-07-04 MED ORDER — PEG-KCL-NACL-NASULF-NA ASC-C 100 G PO SOLR
1.0000 | Freq: Once | ORAL | Status: DC
Start: 2014-07-04 — End: 2014-07-04

## 2014-07-04 MED ORDER — PEG-KCL-NACL-NASULF-NA ASC-C 100 G PO SOLR: 1.0000 | Freq: Once | ORAL | Status: DC

## 2014-07-04 NOTE — Telephone Encounter (Signed)
Prep sent to [facility] and orders have been put in. Instructions mailed.

## 2014-07-04 NOTE — Telephone Encounter (Signed)
Daughter called to set up TCS/EGD/ED with [facility] on 07/12/14. I have left a message for her to call back to make sure the time will work for them. No orders or Rx  have been done on her yet.

## 2014-07-06 NOTE — Progress Notes (Signed)
UR chart review completed.  

## 2014-07-12 ENCOUNTER — Encounter (HOSPITAL_COMMUNITY): Payer: Self-pay | Admitting: *Deleted

## 2014-07-12 ENCOUNTER — Encounter (HOSPITAL_COMMUNITY)
Admission: RE | Disposition: A | Discharge status: Home / Self Care | Payer: Self-pay | Source: Ambulatory Visit | Attending: Gastroenterology

## 2014-07-12 ENCOUNTER — Ambulatory Visit (HOSPITAL_COMMUNITY)
Admission: RE | Admit: 2014-07-12 | Discharge: 2014-07-12 | Disposition: A | Discharge status: Home / Self Care | Payer: Medicare Other | Source: Ambulatory Visit | Attending: Gastroenterology | Admitting: Gastroenterology

## 2014-07-12 DIAGNOSIS — J449 Chronic obstructive pulmonary disease, unspecified: Secondary | ICD-10-CM | POA: Diagnosis not present

## 2014-07-12 DIAGNOSIS — I739 Peripheral vascular disease, unspecified: Secondary | ICD-10-CM | POA: Insufficient documentation

## 2014-07-12 DIAGNOSIS — K2960 Other gastritis without bleeding: Secondary | ICD-10-CM | POA: Insufficient documentation

## 2014-07-12 DIAGNOSIS — Z86718 Personal history of other venous thrombosis and embolism: Secondary | ICD-10-CM | POA: Diagnosis not present

## 2014-07-12 DIAGNOSIS — K621 Rectal polyp: Secondary | ICD-10-CM

## 2014-07-12 DIAGNOSIS — K219 Gastro-esophageal reflux disease without esophagitis: Secondary | ICD-10-CM | POA: Insufficient documentation

## 2014-07-12 DIAGNOSIS — K222 Esophageal obstruction: Secondary | ICD-10-CM

## 2014-07-12 DIAGNOSIS — Z87891 Personal history of nicotine dependence: Secondary | ICD-10-CM | POA: Insufficient documentation

## 2014-07-12 DIAGNOSIS — K2970 Gastritis, unspecified, without bleeding: Secondary | ICD-10-CM | POA: Diagnosis not present

## 2014-07-12 DIAGNOSIS — K625 Hemorrhage of anus and rectum: Secondary | ICD-10-CM

## 2014-07-12 DIAGNOSIS — Z79899 Other long term (current) drug therapy: Secondary | ICD-10-CM | POA: Insufficient documentation

## 2014-07-12 DIAGNOSIS — K529 Noninfective gastroenteritis and colitis, unspecified: Secondary | ICD-10-CM | POA: Diagnosis not present

## 2014-07-12 DIAGNOSIS — D120 Benign neoplasm of cecum: Secondary | ICD-10-CM | POA: Diagnosis not present

## 2014-07-12 DIAGNOSIS — R1310 Dysphagia, unspecified: Secondary | ICD-10-CM | POA: Diagnosis not present

## 2014-07-12 DIAGNOSIS — I10 Essential (primary) hypertension: Secondary | ICD-10-CM | POA: Insufficient documentation

## 2014-07-12 DIAGNOSIS — Z8673 Personal history of transient ischemic attack (TIA), and cerebral infarction without residual deficits: Secondary | ICD-10-CM | POA: Diagnosis not present

## 2014-07-12 DIAGNOSIS — K259 Gastric ulcer, unspecified as acute or chronic, without hemorrhage or perforation: Secondary | ICD-10-CM | POA: Diagnosis not present

## 2014-07-12 DIAGNOSIS — D123 Benign neoplasm of transverse colon: Secondary | ICD-10-CM

## 2014-07-12 HISTORY — PX: COLONOSCOPY: SHX5424

## 2014-07-12 HISTORY — PX: SAVORY DILATION: SHX5439

## 2014-07-12 HISTORY — PX: ESOPHAGOGASTRODUODENOSCOPY: SHX5428

## 2014-07-12 HISTORY — PX: MALONEY DILATION: SHX5535

## 2014-07-12 SURGERY — COLONOSCOPY
Anesthesia: Moderate Sedation

## 2014-07-12 MED ORDER — LIDOCAINE VISCOUS 2 % MT SOLN
OROMUCOSAL | Status: AC
  Filled 2014-07-12: qty 15

## 2014-07-12 MED ORDER — MEPERIDINE HCL 100 MG/ML IJ SOLN
INTRAMUSCULAR | Status: DC | PRN
  Administered 2014-07-12 (×3): 25 mg via INTRAVENOUS

## 2014-07-12 MED ORDER — PANTOPRAZOLE SODIUM 40 MG PO TBEC: DELAYED_RELEASE_TABLET | ORAL | Status: DC

## 2014-07-12 MED ORDER — MEPERIDINE HCL 100 MG/ML IJ SOLN
INTRAMUSCULAR | Status: DC
Start: 2014-07-12 — End: 2014-07-12
  Filled 2014-07-12: qty 2

## 2014-07-12 MED ORDER — SODIUM CHLORIDE 0.9 % IV SOLN
INTRAVENOUS | Status: DC
  Administered 2014-07-12: 07:00:00 via INTRAVENOUS

## 2014-07-12 MED ORDER — STERILE WATER FOR IRRIGATION IR SOLN
Status: DC | PRN
  Administered 2014-07-12: 08:00:00

## 2014-07-12 MED ORDER — MIDAZOLAM HCL 5 MG/5ML IJ SOLN
INTRAMUSCULAR | Status: AC
  Filled 2014-07-12: qty 10

## 2014-07-12 MED ORDER — LIDOCAINE VISCOUS 2 % MT SOLN
OROMUCOSAL | Status: DC | PRN
  Administered 2014-07-12: 1 via OROMUCOSAL

## 2014-07-12 MED ORDER — MIDAZOLAM HCL 5 MG/5ML IJ SOLN
INTRAMUSCULAR | Status: DC | PRN
  Administered 2014-07-12 (×2): 1 mg via INTRAVENOUS
  Administered 2014-07-12: 2 mg via INTRAVENOUS

## 2014-07-12 NOTE — H&P (Addendum)
Primary Care Physician:  Glo Herring., MD Primary Gastroenterologist:  Dr. Oneida Alar  Pre-Procedure History & Physical: HPI:  Caroline [name] is a 80 y.o. female here for DYSPHAGIA/colitis. No more rectal bleeding or abdominal pain-c/o abdomina; pain.  Past Medical History  Diagnosis Date  . Hypertension   . GERD (gastroesophageal reflux disease)   . Cerebrovascular disease   . PAD (peripheral artery disease)     lower extremity PAD with bilateral leg stenting  . TIA (transient ischemic attack)   . COPD (chronic obstructive pulmonary disease)   . CVA 10/25/2008    Qualifier: Diagnosis of  By: [name], CNA, Christy    . Internal carotid artery stenosis 05/13/2012    HIGH GRADE LEFT INTERNAL CAROTID STENOSIS.  S/p  L CEA 05/11/12  . Irregular heart beat   . DVT (deep venous thrombosis)     Past Surgical History  Procedure Laterality Date  . Appendectomy    . Partial hysterectomy    . Gallbladder surgery    . Cholecystectomy    . Endarterectomy  05/11/2012    Procedure: ENDARTERECTOMY CAROTID;  Surgeon: [name], MD;  Location: [place];  Service: Vascular;  Laterality: Left;  . Patch angioplasty  05/11/2012    Procedure: PATCH ANGIOPLASTY;  Surgeon: [name], MD;  Location: [facility];  Service: Vascular;  Laterality: Left;  with Dacron Patch Angioplasty  . Carotid endarterectomy    . Carotid angiogram N/A 05/06/2012    Procedure: CAROTID ANGIOGRAM;  Surgeon: [name], MD;  Location: [facility] C[facility];  Service: Cardiovascular;  Laterality: N/A;    Prior to Admission medications   Medication Sig Start Date End Date Taking? Authorizing Provider  budesonide-formoterol (SYMBICORT) 160-4.5 MCG/ACT inhaler Inhale 2 puffs into the lungs 2 (two) times daily.   Yes Historical Provider, MD  cilostazol (PLETAL) 100 MG tablet Take 100 mg by mouth daily.    Yes Historical Provider, MD  ciprofloxacin (CIPRO) 250 MG tablet Take 1 tablet (250 mg total) by mouth 2 (two) times daily.  07/02/14  Yes Erline Hau, MD  hydrochlorothiazide (MICROZIDE) 12.5 MG capsule Take 12.5 mg by mouth daily.   Yes Historical Provider, MD  metroNIDAZOLE (FLAGYL) 500 MG tablet Take 1 tablet (500 mg total) by mouth 3 (three) times daily. 07/02/14  Yes Erline Hau, MD  peg 3350 powder (MOVIPREP) 100 G SOLR Take 1 kit (200 g total) by mouth once. 07/04/14 08/03/14 Yes [name], MD  potassium chloride SA (K-DUR,KLOR-CON) 20 MEQ tablet Take 20 mEq by mouth daily.   Yes Historical Provider, MD  tiotropium (SPIRIVA) 18 MCG inhalation capsule Place 18 mcg into inhaler and inhale at bedtime.   Yes Historical Provider, MD  verapamil (CALAN-SR) 240 MG CR tablet Take 240 mg by mouth daily. Takes at lunch time   Yes Historical Provider, MD  levalbuterol Penne Lash) 0.63 MG/3ML nebulizer solution Take 3 mLs (0.63 mg total) by nebulization every 4 (four) hours. 04/18/14   Erline Hau, MD    Allergies as of 07/04/2014 - Review Complete 06/30/2014  Allergen Reaction Noted  . Ibuprofen    . Pnu-imune [pneumococcal polysaccharide vaccine]  06/30/2014  . Albuterol  06/04/2010  . Amoxicillin Hives   . Cephalexin Hives   . Codeine    . Entex pac [pseudoephedrine-gg & dm]  04/23/2012  . Etanercept    . Fluticasone-salmeterol    . Gualenic acid  04/23/2012  . Lorazepam    . Nubain [  nalbuphine hcl]  04/23/2012  . Oxycodone hcl Nausea And Vomiting   . Oxycodone-acetaminophen Nausea And Vomiting   . Roxicodone [oxycodone hcl]  04/13/2014  . Tylox [oxycodone-acetaminophen]  04/13/2014    Family History  Problem Relation Age of Onset  . Deep vein thrombosis Sister     Blood Clot in leg    History   Social History  . Marital Status: Married    Spouse Name: N/A    Number of Children: N/A  . Years of Education: N/A   Occupational History  . unemployed    Social History Main Topics  . Smoking status: Former Smoker -- 1.00 packs/day  . Smokeless tobacco:  Current User  . Alcohol Use: No  . Drug Use: No  . Sexual Activity: No   Other Topics Concern  . Not on file   Social History Narrative   Review of Systems: See HPI, otherwise negative ROS  Physical Exam: BP 136/78 mmHg  Pulse 95  Temp(Src) 97.5 F (36.4 C) (Oral)  Resp 24  Ht 5' 4"  (1.626 m)  Wt 95 lb (43.092 kg)  BMI 16.30 kg/m2  SpO2 94% General:   Alert,  pleasant and cooperative in NAD Head:  Normocephalic and atraumatic. Neck:  Supple; Lungs:  Clear throughout to auscultation.    Heart:  Regular rate and rhythm. Abdomen:  Soft, nontender and nondistended. Normal bowel sounds, without guarding, and without rebound.   Neurologic:  Alert and  oriented x4;  grossly normal neurologically.  Impression/Plan:    COLITIS/dysphagia  Plan:  1. TCS/EGD/?DIL TODAY

## 2014-07-12 NOTE — Op Note (Addendum)
[facility] [address] [place], [address]   ENDOSCOPY PROCEDURE REPORT  PATIENT: Caroline [name], Caroline [name]  MR#: [id number] BIRTHDATE: 1935/06/17 , 25  yrs. old GENDER: female  ENDOSCOPIST: Barney Drain, MD REFFERED OZ:[id number] Gerarda Fraction, M.D. PROCEDURE DATE:  07-28-2014 PROCEDURE:   EGD with biopsy and EGD with dilatation over guidewire   INDICATIONS:1.  dyspepsia.   2.  dysphagia. MEDICATIONS: Monitored anesthesia care TOPICAL ANESTHETIC: Viscous Xylocaine  DESCRIPTION OF PROCEDURE:   After the risks benefits and alternatives of the procedure were thoroughly explained, informed consent was obtained.  The EG-[id number] ([id number])  endoscope was introduced through the mouth and advanced to the second portion of the duodenum. The instrument was slowly withdrawn as the mucosa was carefully examined.  Prior to withdrawal of the scope, the guidwire was placed.  The esophagus was dilated successfully.  The patient was recovered in endoscopy and discharged home in satisfactory condition.   ESOPHAGUS: A stricture was found in the lower third of the esophagus.   STOMACH: Mild non-erosive gastritis (inflammation) was found in the gastric antrum.  Multiple biopsies were performed using cold forceps.   DUODENUM: The duodenal mucosa showed no abnormalities in the bulb and second portion of the duodenum. Dilation was then performed at the gastroesphageal junction Dilator: Savary over guidewire Size(s): 12.8-16 mm Resistance: minimal Heme: yes Appearance: adequate  COMPLICATIONS: There were no immediate complications.  ENDOSCOPIC IMPRESSION: 1.   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   MILD Non-erosive gastritis  RECOMMENDATIONS: HOLD PRADAXA. START PROTONIX TO TREAT GASTRITIS.  TAKE 30 MINUTES PRIOR TO MEALS TWICE DAILY. FOLLOW A LOW FAT/HIGH FIBER DIET.  AVOID ITEMS THAT CAUSE BLOATING.  SCHEDULE YOUR COLONOSCOPY WITHIN THE NEXT month at [facility]. FOLLOW UP IN 4  MOS.  _______________________________ Lorrin MaisBarney Drain, MD 07-28-14 4:05 PM   CPT CODES: ICD CODES:  The ICD and CPT codes recommended by this software are interpretations from the data that the clinical staff has captured with the software.  The verification of the translation of this report to the ICD and CPT codes and modifiers is the sole responsibility of the health care institution and practicing physician where this report was generated.  [employer] will not be held responsible for the validity of the ICD and CPT codes included on this report.  AMA assumes no liability for data contained or not contained herein. C[employer] is a Designer, television/film set of the [employer].

## 2014-07-12 NOTE — Discharge Instructions (Signed)
Your prep was not good. YOU NEED TO GO TO [facility] TO HAVE THE LARGE FLAT POLYP REMOVED FROM YOUR COLON AND TO GET A COMPLETE EXAM.  You have ONE SMALL POLYP REMOVED & internal hemorrhoids. I BIOPSIED THE LARGE COLON POLYP. I dilated your esophagus DUE TO A STRICTURE NEAR THE BASE OF YOUR ESOPHAGUS. YOU HAVE A SMALL HIATAL HERNIA & GASTRITIS. I BIOPSIED YOUR STOMACH.   HOLD PRADAXA.  START PROTONIX TO TREAT GASTRITIS. TAKE 30 MINUTES PRIOR TO MEALS TWICE DAILY.  FOLLOW A LOW FAT/HIGH FIBER DIET. AVOID ITEMS THAT CAUSE BLOATING. SEE INFO BELOW.  WE WILL SCHEDULE YOUR COLONOSCOPY WITHIN THE NEXT month at [facility].  FOLLOW UP IN 4 MOS.      ENDOSCOPY Care After Read the instructions outlined below and refer to this sheet in the next week. These discharge instructions provide you with general information on caring for yourself after you leave the hospital. While your treatment has been planned according to the most current medical practices available, unavoidable complications occasionally occur. If you have any problems or questions after discharge, call DR. Alya Smaltz, [phone].  ACTIVITY  You may resume your regular activity, but move at a slower pace for the next 24 hours.   Take frequent rest periods for the next 24 hours.   Walking will help get rid of the air and reduce the bloated feeling in your belly (abdomen).   No driving for 24 hours (because of the medicine (anesthesia) used during the test).   You may shower.   Do not sign any important legal documents or operate any machinery for 24 hours (because of the anesthesia used during the test).    NUTRITION  Drink plenty of fluids.   You may resume your normal diet as instructed by your doctor.   Begin with a light meal and progress to your normal diet. Heavy or fried foods are harder to digest and may make you feel sick to your stomach (nauseated).   Avoid alcoholic beverages for 24 hours or as instructed.     MEDICATIONS  You may resume your normal medications.   WHAT YOU CAN EXPECT TODAY  Some feelings of bloating in the abdomen.   Passage of more gas than usual.   Spotting of blood in your stool or on the toilet paper  .  IF YOU HAD POLYPS REMOVED DURING THE ENDOSCOPY:  Eat a soft diet IF YOU HAVE NAUSEA, BLOATING, ABDOMINAL PAIN, OR VOMITING.    FINDING OUT THE RESULTS OF YOUR TEST Not all test results are available during your visit. DR. Oneida Alar WILL CALL YOU WITHIN 14 DAYS OF YOUR PROCEDUE WITH YOUR RESULTS. Do not assume everything is normal if you have not heard from DR. Navneet Schmuck, CALL HER OFFICE AT [phone].  SEEK IMMEDIATE MEDICAL ATTENTION AND CALL THE OFFICE: [phone] IF:  You have more than a spotting of blood in your stool.   Your belly is swollen (abdominal distention).   You are nauseated or vomiting.   You have a temperature over 101F.   You have abdominal pain or discomfort that is severe or gets worse throughout the day.   High-Fiber Diet A high-fiber diet changes your normal diet to include more whole grains, legumes, fruits, and vegetables. Changes in the diet involve replacing refined carbohydrates with unrefined foods. The calorie level of the diet is essentially unchanged. The Dietary Reference Intake (recommended amount) for adult males is 38 grams per day. For adult females, it is 25 grams per day.  Pregnant and lactating women should consume 28 grams of fiber per day. Fiber is the intact part of a plant that is not broken down during digestion. Functional fiber is fiber that has been isolated from the plant to provide a beneficial effect in the body. PURPOSE  Increase stool bulk.   Ease and regulate bowel movements.   Lower cholesterol.  INDICATIONS THAT YOU NEED MORE FIBER  Constipation and hemorrhoids.   Uncomplicated diverticulosis (intestine condition) and irritable bowel syndrome.   Weight management.   As a protective measure  against hardening of the arteries (atherosclerosis), diabetes, and cancer.   GUIDELINES FOR INCREASING FIBER IN THE DIET  Start adding fiber to the diet slowly. A gradual increase of about 5 more grams (2 slices of whole-wheat bread, 2 servings of most fruits or vegetables, or 1 bowl of high-fiber cereal) per day is best. Too rapid an increase in fiber may result in constipation, flatulence, and bloating.   Drink enough water and fluids to keep your urine clear or pale yellow. Water, juice, or caffeine-free drinks are recommended. Not drinking enough fluid may cause constipation.   Eat a variety of high-fiber foods rather than one type of fiber.   Try to increase your intake of fiber through using high-fiber foods rather than fiber pills or supplements that contain small amounts of fiber.   The goal is to change the types of food eaten. Do not supplement your present diet with high-fiber foods, but replace foods in your present diet.  INCLUDE A VARIETY OF FIBER SOURCES  Replace refined and processed grains with whole grains, canned fruits with fresh fruits, and incorporate other fiber sources. White rice, white breads, and most bakery goods contain little or no fiber.   Brown whole-grain rice, buckwheat oats, and many fruits and vegetables are all good sources of fiber. These include: broccoli, Brussels sprouts, cabbage, cauliflower, beets, sweet potatoes, white potatoes (skin on), carrots, tomatoes, eggplant, squash, berries, fresh fruits, and dried fruits.   Cereals appear to be the richest source of fiber. Cereal fiber is found in whole grains and bran. Bran is the fiber-rich outer coat of cereal grain, which is largely removed in refining. In whole-grain cereals, the bran remains. In breakfast cereals, the largest amount of fiber is found in those with "bran" in their names. The fiber content is sometimes indicated on the label.   You may need to include additional fruits and vegetables each  day.   In baking, for 1 cup white flour, you may use the following substitutions:   1 cup whole-wheat flour minus 2 tablespoons.   1/2 cup white flour plus 1/2 cup whole-wheat flour.   Low-Fat Diet BREADS, CEREALS, PASTA, RICE, DRIED PEAS, AND BEANS These products are high in carbohydrates and most are low in fat. Therefore, they can be increased in the diet as substitutes for fatty foods. They too, however, contain calories and should not be eaten in excess. Cereals can be eaten for snacks as well as for breakfast.   FRUITS AND VEGETABLES It is good to eat fruits and vegetables. Besides being sources of fiber, both are rich in vitamins and some minerals. They help you get the daily allowances of these nutrients. Fruits and vegetables can be used for snacks and desserts.  MEATS Limit lean meat, chicken, [place], and fish to no more than 6 ounces per day. Beef, Pork, and Lamb Use lean cuts of beef, pork, and lamb. Lean cuts include:  Extra-lean ground beef.  Arm  roast.  Sirloin tip.  Center-cut ham.  Round steak.  Loin chops.  Rump roast.  Tenderloin.  Trim all fat off the outside of meats before cooking. It is not necessary to severely decrease the intake of red meat, but lean choices should be made. Lean meat is rich in protein and contains a highly absorbable form of iron. Premenopausal women, in particular, should avoid reducing lean red meat because this could increase the risk for low red blood cells (iron-deficiency anemia).  Chicken and [place] These are good sources of protein. The fat of poultry can be reduced by removing the skin and underlying fat layers before cooking. Chicken and [place] can be substituted for lean red meat in the diet. Poultry should not be fried or covered with high-fat sauces. Fish and Shellfish Fish is a good source of protein. Shellfish contain cholesterol, but they usually are low in saturated fatty acids. The preparation of fish is important. Like  chicken and [place], they should not be fried or covered with high-fat sauces. EGGS Egg whites contain no fat or cholesterol. They can be eaten often. Try 1 to 2 egg whites instead of whole eggs in recipes or use egg substitutes that do not contain yolk. MILK AND DAIRY PRODUCTS Use skim or 1% milk instead of 2% or whole milk. Decrease whole milk, natural, and processed cheeses. Use nonfat or low-fat (2%) cottage cheese or low-fat cheeses made from vegetable oils. Choose nonfat or low-fat (1 to 2%) yogurt. Experiment with evaporated skim milk in recipes that call for heavy cream. Substitute low-fat yogurt or low-fat cottage cheese for sour cream in dips and salad dressings. Have at least 2 servings of low-fat dairy products, such as 2 glasses of skim (or 1%) milk each day to help get your daily calcium intake. FATS AND OILS Reduce the total intake of fats, especially saturated fat. Butterfat, lard, and beef fats are high in saturated fat and cholesterol. These should be avoided as much as possible. Vegetable fats do not contain cholesterol, but certain vegetable fats, such as coconut oil, palm oil, and palm kernel oil are very high in saturated fats. These should be limited. These fats are often used in bakery goods, processed foods, popcorn, oils, and nondairy creamers. Vegetable shortenings and some peanut butters contain hydrogenated oils, which are also saturated fats. Read the labels on these foods and check for saturated vegetable oils. Unsaturated vegetable oils and fats do not raise blood cholesterol. However, they should be limited because they are fats and are high in calories. Total fat should still be limited to 30% of your daily caloric intake. Desirable liquid vegetable oils are corn oil, cottonseed oil, olive oil, canola oil, safflower oil, soybean oil, and sunflower oil. Peanut oil is not as good, but small amounts are acceptable. Buy a heart-healthy tub margarine that has no partially  hydrogenated oils in the ingredients. Mayonnaise and salad dressings often are made from unsaturated fats, but they should also be limited because of their high calorie and fat content. Seeds, nuts, peanut butter, olives, and avocados are high in fat, but the fat is mainly the unsaturated type. These foods should be limited mainly to avoid excess calories and fat. OTHER EATING TIPS Snacks  Most sweets should be limited as snacks. They tend to be rich in calories and fats, and their caloric content outweighs their nutritional value. Some good choices in snacks are graham crackers, melba toast, soda crackers, bagels (no egg), English muffins, fruits, and vegetables. These  snacks are preferable to snack crackers, [place] fries, TORTILLA CHIPS, and POTATO chips. Popcorn should be air-popped or cooked in small amounts of liquid vegetable oil. Desserts Eat fruit, low-fat yogurt, and fruit ices instead of pastries, cake, and cookies. Sherbet, angel food cake, gelatin dessert, frozen low-fat yogurt, or other frozen products that do not contain saturated fat (pure fruit juice bars, frozen ice pops) are also acceptable.  COOKING METHODS Choose those methods that use little or no fat. They include: Poaching.  Braising.  Steaming.  Grilling.  Baking.  Stir-frying.  Broiling.  Microwaving.  Foods can be cooked in a nonstick pan without added fat, or use a nonfat cooking spray in regular cookware. Limit fried foods and avoid frying in saturated fat. Add moisture to lean meats by using water, broth, cooking wines, and other nonfat or low-fat sauces along with the cooking methods mentioned above. Soups and stews should be chilled after cooking. The fat that forms on top after a few hours in the refrigerator should be skimmed off. When preparing meals, avoid using excess salt. Salt can contribute to raising blood pressure in some people.  EATING AWAY FROM HOME Order entres, potatoes, and vegetables without  sauces or butter. When meat exceeds the size of a deck of cards (3 to 4 ounces), the rest can be taken home for another meal. Choose vegetable or fruit salads and ask for low-calorie salad dressings to be served on the side. Use dressings sparingly. Limit high-fat toppings, such as bacon, crumbled eggs, cheese, sunflower seeds, and olives. Ask for heart-healthy tub margarine instead of butter.    Hiatal Hernia A hiatal hernia occurs when a part of the stomach slides above the diaphragm. The diaphragm is the thin muscle separating the belly (abdomen) from the chest. A hiatal hernia can be something you are born with or develop over time. Hiatal hernias may allow stomach acid to flow back into your esophagus, the tube which carries food from your mouth to your stomach. If this acid causes problems it is called GERD (gastro-esophageal reflux disease).   SYMPTOMS Common symptoms of GERD are heartburn (burning in your chest). This is worse when lying down or bending over. It may also cause belching and indigestion. Some of the things which make GERD worse are:  Increased weight pushes on stomach making acid rise more easily.   Smoking markedly increases acid production.   Alcohol decreases lower esophageal sphincter pressure (valve between stomach and esophagus), allowing acid from stomach into esophagus.   Late evening meals and going to bed with a full stomach increases pressure.   Anything that causes an increase in acid production.    HOME CARE INSTRUCTIONS  Try to achieve and maintain an ideal body weight.   Avoid drinking alcoholic beverages.   DO NOT smokE.   Do not wear tight clothing around your chest or stomach.   Eat smaller meals and eat more frequently. This keeps your stomach from getting too full. Eat slowly.   Do not lie down for 2 or 3 hours after eating. Do not eat or drink anything 1 to 2 hours before going to bed.   Avoid caffeine beverages (colas, coffee, cocoa,  tea), fatty foods, citrus fruits and all other foods and drinks that contain acid and that seem to increase the problems.   Avoid bending over, especially after eating OR STRAINING. Anything that increases the pressure in your belly increases the amount of acid that may be pushed up into your esophagus.  Hemorrhoids Hemorrhoids are dilated (enlarged) veins around the rectum. Sometimes clots will form in the veins. This makes them swollen and painful. These are called thrombosed hemorrhoids. Causes of hemorrhoids include:  Constipation.   Straining to have a bowel movement.   HEAVY LIFTING HOME CARE INSTRUCTIONS  Eat a well balanced diet and drink 6 to 8 glasses of water every day to avoid constipation. You may also use a bulk laxative.   Avoid straining to have bowel movements.   Keep anal area dry and clean.   Do not use a donut shaped pillow or sit on the toilet for long periods. This increases blood pooling and pain.   Move your bowels when your body has the urge; this will require less straining and will decrease pain and pressure.

## 2014-07-13 ENCOUNTER — Telehealth: Payer: Self-pay

## 2014-07-13 ENCOUNTER — Other Ambulatory Visit: Payer: Self-pay

## 2014-07-13 DIAGNOSIS — K529 Noninfective gastroenteritis and colitis, unspecified: Secondary | ICD-10-CM

## 2014-07-13 DIAGNOSIS — K625 Hemorrhage of anus and rectum: Secondary | ICD-10-CM

## 2014-07-13 NOTE — Telephone Encounter (Signed)
Pt referral was made to [facility] for colonoscopy

## 2014-07-13 NOTE — Op Note (Signed)
Caroline [name], Caroline [name]                ACCOUNT NO.:  [id number]  MEDICAL RECORD NO.:  [id number]  LOCATION:  [facility]                          FACILITY:  [facility]  PHYSICIAN:  Barney Drain, M.D.     DATE OF BIRTH:  10/06/1934  DATE OF PROCEDURE:  07/12/2014 DATE OF DISCHARGE:  07/12/2014                              OPERATIVE REPORT   REFERRED BY:  Sherrilee Gilles. Gerarda Fraction, M.D.  PROCEDURE:  Colonoscopy with cold forceps biopsy and polypectomy.  INDICATION: RECTAL BLEEDING/DIARRHEA  PROCEDURE:  After the risks, benefits, and alternatives of the procedure were thoroughly explained to the patient, an informed consent was obtained.  The colonoscope was introduced through the anus and advanced to the cecum.  The instrument was slowly withdrawn as the mucosa was carefully examined.  The patient was recovered in endoscopy and discharged home in satisfactory condition.  FINDINGS: 1. Large flat cecal polyp, biopsied with cold forceps. 2. A 4-mm sessile hepatic flexure polyp removed via cold forceps. 3. Polypoid lesions in the rectum biopsy via cold forceps. 4. Redundant left colon. 5. Poor bowel prep.  COMPLICATIONS:  None.  IMPRESSION: 1. Large cecal polyp, which will require endoscopic mucosal resection     to remove.  It extends to the IC valve. 2. One colon polyp removed. 3. Polypoid lesions in the rectum. 4. Redundant left colon.  RECOMMENDATIONS: 1. Hold Pradaxa.  The patient is not a candidate for anticoagulation     at this time.  I explained to her daughter that the anticoagulation     needs to be held until her colonoscopy and endoscopic mucosal     resection are complete. 2. Start Protonix to treat gastritis. Take 30 minutes prior to meals     twice daily. 3. Follow a low fat, high-fiber diet.  Avoid items that cause     bloating. 4. Schedule colonoscopy at [facility] with endoscopic mucosal resection     within the next month. NEXT COLONOSCOPY WITH AN OVERTUBE. 5. The patient will  follow up in 4 months.  MEDICATIONS: 1. Demerol 50. 2. Versed 3 mg IV.     Barney Drain, M.D.     SF/MEDQ  D:  07/13/2014  T:  07/13/2014  Job:  [id number]

## 2014-07-14 ENCOUNTER — Other Ambulatory Visit (HOSPITAL_COMMUNITY): Payer: Medicare Other

## 2014-07-14 ENCOUNTER — Ambulatory Visit: Payer: Medicare Other | Admitting: Family

## 2014-07-15 ENCOUNTER — Encounter (HOSPITAL_COMMUNITY): Payer: Self-pay | Admitting: Gastroenterology

## 2014-07-15 DIAGNOSIS — K635 Polyp of colon: Secondary | ICD-10-CM | POA: Diagnosis not present

## 2014-07-15 DIAGNOSIS — Z681 Body mass index (BMI) 19 or less, adult: Secondary | ICD-10-CM | POA: Diagnosis not present

## 2014-07-25 ENCOUNTER — Encounter: Payer: Self-pay | Admitting: Family

## 2014-07-26 ENCOUNTER — Ambulatory Visit (HOSPITAL_COMMUNITY)
Admission: RE | Admit: 2014-07-26 | Discharge: 2014-07-26 | Disposition: A | Discharge status: Home / Self Care | Payer: Medicare Other | Source: Ambulatory Visit | Attending: Family | Admitting: Family

## 2014-07-26 ENCOUNTER — Ambulatory Visit (INDEPENDENT_AMBULATORY_CARE_PROVIDER_SITE_OTHER): Payer: Medicare Other | Admitting: Family

## 2014-07-26 ENCOUNTER — Encounter: Payer: Self-pay | Admitting: Family

## 2014-07-26 VITALS — BP 120/78 | HR 94 | Resp 16 | Ht 64.0 in | Wt 99.2 lb

## 2014-07-26 DIAGNOSIS — Z48812 Encounter for surgical aftercare following surgery on the circulatory system: Secondary | ICD-10-CM

## 2014-07-26 DIAGNOSIS — I6523 Occlusion and stenosis of bilateral carotid arteries: Secondary | ICD-10-CM

## 2014-07-26 DIAGNOSIS — Z87891 Personal history of nicotine dependence: Secondary | ICD-10-CM | POA: Diagnosis not present

## 2014-07-26 DIAGNOSIS — Z9889 Other specified postprocedural states: Secondary | ICD-10-CM

## 2014-07-26 DIAGNOSIS — Z7901 Long term (current) use of anticoagulants: Secondary | ICD-10-CM | POA: Diagnosis not present

## 2014-07-26 NOTE — Patient Instructions (Addendum)
Stroke Prevention Some medical conditions and behaviors are associated with an increased chance of having a stroke. You may prevent a stroke by making healthy choices and managing medical conditions. HOW CAN I REDUCE MY RISK OF HAVING A STROKE?   Stay physically active. Get at least 30 minutes of activity on most or all days.  Do not smoke. It may also be helpful to avoid exposure to secondhand smoke.  Limit alcohol use. Moderate alcohol use is considered to be:  No more than 2 drinks per day for men.  No more than 1 drink per day for nonpregnant women.  Eat healthy foods. This involves:  Eating 5 or more servings of fruits and vegetables a day.  Making dietary changes that address high blood pressure (hypertension), high cholesterol, diabetes, or obesity.  Manage your cholesterol levels.  Making food choices that are high in fiber and low in saturated fat, trans fat, and cholesterol may control cholesterol levels.  Take any prescribed medicines to control cholesterol as directed by your health care provider.  Manage your diabetes.  Controlling your carbohydrate and sugar intake is recommended to manage diabetes.  Take any prescribed medicines to control diabetes as directed by your health care provider.  Control your hypertension.  Making food choices that are low in salt (sodium), saturated fat, trans fat, and cholesterol is recommended to manage hypertension.  Take any prescribed medicines to control hypertension as directed by your health care provider.  Maintain a healthy weight.  Reducing calorie intake and making food choices that are low in sodium, saturated fat, trans fat, and cholesterol are recommended to manage weight.  Stop drug abuse.  Avoid taking birth control pills.  Talk to your health care provider about the risks of taking birth control pills if you are over 35 years old, smoke, get migraines, or have ever had a blood clot.  Get evaluated for sleep  disorders (sleep apnea).  Talk to your health care provider about getting a sleep evaluation if you snore a lot or have excessive sleepiness.  Take medicines only as directed by your health care provider.  For some people, aspirin or blood thinners (anticoagulants) are helpful in reducing the risk of forming abnormal blood clots that can lead to stroke. If you have the irregular heart rhythm of atrial fibrillation, you should be on a blood thinner unless there is a good reason you cannot take them.  Understand all your medicine instructions.  Make sure that other conditions (such as anemia or atherosclerosis) are addressed. SEEK IMMEDIATE MEDICAL CARE IF:   You have sudden weakness or numbness of the face, arm, or leg, especially on one side of the body.  Your face or eyelid droops to one side.  You have sudden confusion.  You have trouble speaking (aphasia) or understanding.  You have sudden trouble seeing in one or both eyes.  You have sudden trouble walking.  You have dizziness.  You have a loss of balance or coordination.  You have a sudden, severe headache with no known cause.  You have new chest pain or an irregular heartbeat. Any of these symptoms may represent a serious problem that is an emergency. Do not wait to see if the symptoms will go away. Get medical help at once. Call your local emergency services (911 in [place] Do not drive yourself to the hospital. Document Released: 08/01/2004 Document Revised: 11/08/2013 Document Reviewed: 12/25/2012 ExitCare Patient Information 2015 [employer]. This information is not intended to replace advice given   to you by your health care provider. Make sure you discuss any questions you have with your health care provider.  

## 2014-07-26 NOTE — Progress Notes (Signed)
Established Carotid Patient   History of Present Illness  Caroline [name] is a 80 y.o. female patient of Dr. Oneida Alar who is status post left carotid endarterectomy in November, 2013. This was done for symptomatic left internal carotid artery stenosis.  She returns today for carotid artery surveillance and follow up.  Patient has Positive history of TIA or stroke symptom, just before the CEA in 2013, no further stroke or TIA symptoms since then. She has some mild loss of right hand dexterity from her first stroke. She did have some unilateral facial drooping with a stroke but this quickly resolved.   The patient denies amaurosis fugax or monocular blindness.  Pt. denies hemiplegia. The patient denies receptive or expressive aphasia. Pt. denies extremity weakness. She denies claudication symptoms, denies non-healing wounds.  Her husband has severe health issues which is a stressor for her. 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 Is using 2L/[place] supplemental oxygen 24/7.  Patient reports New Medical or Surgical History: much less coughing since she changed from cigarettes to vapor nicotine; had a colonoscopy, needs further follow up at [facility] for a flat polyp; had occult blood in stool, has hiatal hernia. She has gained 6 pounds since she quit smoking.  Pt Diabetic: No Pt smoker: former smoker x 60 years (stopped August 2015, is using vapor cigs with nicotine, is weaning down)  Pt meds include: Statin : No ASA: No Other anticoagulants/antiplatelets: Pradaxa prescribed by her cardiologist. Taking Pletal, started by Dr. Percival Spanish, and states this helps re the claudication in her calves.  Past Medical History  Diagnosis Date  . Hypertension   . GERD (gastroesophageal reflux disease)   . Cerebrovascular disease   . PAD (peripheral artery disease)     lower extremity PAD with bilateral  leg stenting  . TIA (transient ischemic attack)   . COPD (chronic obstructive pulmonary disease)   . CVA 10/25/2008    Qualifier: Diagnosis of  By: [name], CNA, Christy    . Internal carotid artery stenosis 05/13/2012    HIGH GRADE LEFT INTERNAL CAROTID STENOSIS.  S/p  L CEA 05/11/12  . Irregular heart beat   . DVT (deep venous thrombosis)     Social History History  Substance Use Topics  . Smoking status: Former Smoker -- 1.00 packs/day  . Smokeless tobacco: Current User  . Alcohol Use: No    Family History Family History  Problem Relation Age of Onset  . Deep vein thrombosis Sister     Blood Clot in leg    Surgical History Past Surgical History  Procedure Laterality Date  . Appendectomy    . Partial hysterectomy    . Gallbladder surgery    . Cholecystectomy    . Endarterectomy  05/11/2012    Procedure: ENDARTERECTOMY CAROTID;  Surgeon: [name], MD;  Location: [facility];  Service: Vascular;  Laterality: Left;  . Patch angioplasty  05/11/2012    Procedure: PATCH ANGIOPLASTY;  Surgeon: [name], MD;  Location: [facility] OR;  Service: Vascular;  Laterality: Left;  with Dacron Patch Angioplasty  . Carotid endarterectomy    . Carotid angiogram N/A 05/06/2012    Procedure: CAROTID ANGIOGRAM;  Surgeon: [name], MD;  Location: [facility] CATH LAB;  Service: Cardiovascular;  Laterality: N/A;  . Colonoscopy N/A 07/12/2014    Procedure: COLONOSCOPY;  Surgeon: [name], MD;  Location: [facility];  Service:  Endoscopy;  Laterality: N/A;  1215pm - moved to 7:30 - Ginger notified pt  . Esophagogastroduodenoscopy N/A 07/12/2014    Procedure: ESOPHAGOGASTRODUODENOSCOPY (EGD);  Surgeon: [name], MD;  Location: [facility];  Service: Endoscopy;  Laterality: N/A;  . Savory dilation N/A 07/12/2014    Procedure: SAVORY DILATION;  Surgeon: [name], MD;  Location: [facility];  Service: Endoscopy;  Laterality: N/A;  Venia Minks dilation N/A 07/12/2014    Procedure: Venia Minks  DILATION;  Surgeon: [name], MD;  Location: [facility];  Service: Endoscopy;  Laterality: N/A;    Allergies  Allergen Reactions  . Ibuprofen     Gi upset  . Pnu-Imune [Pneumococcal Polysaccharide Vaccine]     Huge knot at site and rash  . Albuterol     Confusion and shortness of breath  . Amoxicillin Hives  . Cephalexin Hives  . Codeine     confusion  . Entex Pac [Pseudoephedrine-Gg & Dm] Other (See Comments)    Upper respiratory.   . Etanercept     vertigo  . Fluticasone-Salmeterol     Confusion and shortness of breath  . Gualenic Acid Other (See Comments)    unknown  . Lorazepam     hallucinations  . Nubain [Nalbuphine Hcl]   . Oxycodone Hcl Nausea And Vomiting  . Oxycodone-Acetaminophen Nausea And Vomiting  . Roxicodone [Oxycodone Hcl]   . Tylox [Oxycodone-Acetaminophen]     Current Outpatient Prescriptions  Medication Sig Dispense Refill  . budesonide-formoterol (SYMBICORT) 160-4.5 MCG/ACT inhaler Inhale 2 puffs into the lungs 2 (two) times daily.    . cilostazol (PLETAL) 100 MG tablet Take 100 mg by mouth daily.     . hydrochlorothiazide (MICROZIDE) 12.5 MG capsule Take 12.5 mg by mouth daily.    . metroNIDAZOLE (FLAGYL) 500 MG tablet Take 1 tablet (500 mg total) by mouth 3 (three) times daily. 42 tablet 0  . potassium chloride SA (K-DUR,KLOR-CON) 20 MEQ tablet Take 20 mEq by mouth daily.    Marland Kitchen tiotropium (SPIRIVA) 18 MCG inhalation capsule Place 18 mcg into inhaler and inhale at bedtime.    . verapamil (CALAN-SR) 240 MG CR tablet Take 240 mg by mouth daily. Takes at lunch time    . ciprofloxacin (CIPRO) 250 MG tablet Take 1 tablet (250 mg total) by mouth 2 (two) times daily. (Patient not taking: Reported on 07/26/2014) 28 tablet 0  . levalbuterol (XOPENEX) 0.63 MG/3ML nebulizer solution Take 3 mLs (0.63 mg total) by nebulization every 4 (four) hours. (Patient not taking: Reported on 07/26/2014) 3 mL 12  . pantoprazole (PROTONIX) 40 MG tablet 1 po bid for 3 mos  then once daily forever (Patient not taking: Reported on 07/26/2014) 60 tablet 11   No current facility-administered medications for this visit.    Review of Systems : See HPI for pertinent positives and negatives.  Physical Examination  Filed Vitals:   07/26/14 1418 07/26/14 1421  BP: 120/72 120/78  Pulse: 93 94  Resp:  16  Height:  5\' 4"  (1.626 m)  Weight:  99 lb 3.2 oz (44.997 kg)  SpO2:  99%   Body mass index is 17.02 kg/(m^2).  General: WDWN female in NAD, thin. GAIT: normal Eyes: PERRLA Pulmonary: Minimal air movement in all fields, no rales or rhonchi auscultated, no cough. Wearing [place]  Cardiac: irregular Rhythm, no detected Murmurs.  VASCULAR EXAM Carotid Bruits Left Right   Negative Negative   Aorta is not palpable. Radial pulses are 2+ palpable  and equal.      LE Pulses LEFT RIGHT   POPLITEAL  not palpable  not palpable   POSTERIOR TIBIAL not palpable  not palpable    DORSALIS PEDIS  ANTERIOR TIBIAL not palpable  not palpable     Gastrointestinal: soft, nontender, BS WNL, no r/g, no palpable masses.  Musculoskeletal: Negative muscle atrophy/wasting. M/S 4/5 throughout, Extremities without ischemic changes.  Neurologic: A&O X 3; Appropriate Affect ; SENSATION ;normal;  Speech is normal CN 2-12 intact, Pain and light touch intact in extremities, Motor exam as listed above.   Non-Invasive Vascular Imaging CAROTID DUPLEX 07/26/2014   CEREBROVASCULAR DUPLEX EVALUATION    INDICATION: Carotid endarterectomy     PREVIOUS INTERVENTION(S): Left carotid endarterectomy on 05/11/12    DUPLEX EXAM:     RIGHT  LEFT  Peak Systolic Velocities (cm/s) End Diastolic Velocities (cm/s) Plaque LOCATION Peak Systolic Velocities (cm/s) End Diastolic Velocities (cm/s) Plaque  87 17 HT CCA PROXIMAL  84 17 HT  78 15 HT CCA MID 76 17 HT  73 13 HT CCA DISTAL 79 11 HM  45 4 HT ECA 78 9   61 14 HT ICA PROXIMAL 79 11   62 20  ICA MID 55 17   52 17  ICA DISTAL 53 16     0.84 ICA / CCA Ratio (PSV) Not Calculated  Antegrade Vertebral Flow Antegrade  638 Brachial Systolic Pressure (mmHg) 756  Multiphasic (subclavian artery) Brachial Artery Waveforms Multiphasic (subclavian artery)    Plaque Morphology:  HM = Homogeneous, HT = Heterogeneous, CP = Calcific Plaque, SP = Smooth Plaque, IP = Irregular Plaque     ADDITIONAL FINDINGS: No significant stenosis of the bilateral external or common carotid arteries.    IMPRESSION: 1. Patent left carotid endarterectomy site with no left internal carotid artery stenosis. 2. Doppler velocities suggest a less than 40% stenosis of the right proximal internal carotid artery.    Compared to the previous exam:  No significant change noted when compared to the previous exam on 06/24/13.       Assessment: Caroline [name] is a 80 y.o. female who is status post left carotid endarterectomy in November, 2013. This was done for symptomatic left internal carotid artery stenosis.   She presents with asymptomatic patent left carotid endarterectomy site with no left internal carotid artery stenosis and <40% right ICA stenosis. No significant change noted when compared to the previous exam on 06/24/13.  Face to face time with patient was 25 minutes. Over 50% of this time was spent on counseling and coordination of care.    Plan: Follow-up in 1 year with Carotid Duplex.   I discussed in depth with the patient the nature of atherosclerosis, and emphasized the importance of maximal medical management including strict control of blood pressure, blood glucose, and lipid levels, obtaining regular exercise, and cessation of smoking.  The patient is aware that without maximal medical management the underlying atherosclerotic disease process will progress, limiting the  benefit of any interventions. The patient was given information about stroke prevention and what symptoms should prompt the patient to seek immediate medical care. Thank you for allowing [place] to participate in this patient's care.  Clemon Chambers, RN, MSN, FNP-C [facility] Office: [phone]  Clinic Physician: Kellie Simmering  07/26/2014  2:11 PM

## 2014-08-05 ENCOUNTER — Telehealth: Payer: Self-pay | Admitting: Gastroenterology

## 2014-08-05 NOTE — Telephone Encounter (Signed)
Please call pt. She had simple adenomas removed from her colon. SHE still HAS A LARGE SIMPLE ADENOMA IN HER CECUM THAT NEEDS TO BE REMOVED IN [facility] 2016.    CONTINUE PROTONIX TO TREAT GASTRITIS. YOU CAN DECREASE IT TO ONCE A DAY 30 MINUTES PRIOR TO YOUR FIRST MEAL.  FOLLOW A LOW FAT/HIGH FIBER DIET. AVOID ITEMS THAT CAUSE BLOATING.   FOLLOW UP IN E30 MAY 2016 W/ [facility] COLITIS, ANEMIA, LARGE COLON POLYP.

## 2014-08-08 ENCOUNTER — Telehealth: Payer: Self-pay | Admitting: Cardiology

## 2014-08-08 NOTE — Telephone Encounter (Signed)
Message routed to Dr. Percival Spanish & JC to review and advise on surgery

## 2014-08-08 NOTE — Telephone Encounter (Signed)
Pt is aware of results and said she is already scheduled at [facility] with Dr. Arsenio Loader for to have the polyp removed from her cecum.

## 2014-08-08 NOTE — Telephone Encounter (Signed)
Request for surgical clearance:  1. What type of surgery is being performed? Colonoscopy.. At [facility]    2. When is this surgery scheduled? 09/05/2014  3. Are there any medications that need to be held prior to surgery and how long? Pradaxa  4. Name of physician performing surgery? Dr. Olegario Messier at [facility]   5. What is your office phone and fax number? [phone] fax# // [phone] telephone  Pt request a call back to discuss if it is ok for her to be off of Pradaxa

## 2014-08-08 NOTE — Telephone Encounter (Signed)
Please review.  The last discharge summary and a recent GI note seems to indicate that the patient was no longer on anticoagulation.

## 2014-08-09 NOTE — Telephone Encounter (Signed)
Reminder in epic °

## 2014-08-10 NOTE — Telephone Encounter (Signed)
Spoke with pt, she reports she restarted the pradaxa after her colonoscopy. Now they need to do surgery for what they found. Will make dr hochrein aware.

## 2014-08-11 NOTE — Telephone Encounter (Signed)
OK to hold the Pradaxa for two days prior to the surgery. They should restart it when they think that the risk of bleeding is low enough post op.

## 2014-08-15 ENCOUNTER — Encounter: Payer: Self-pay | Admitting: *Deleted

## 2014-08-15 NOTE — Telephone Encounter (Signed)
pradaxa clearance for procedure form filled out and faxed

## 2014-09-06 ENCOUNTER — Telehealth: Payer: Self-pay | Admitting: Cardiology

## 2014-09-06 NOTE — Telephone Encounter (Signed)
Pt's daughter called in stating that she will be having a colonoscopy on 3/7 and would like to know when she needs to stop taking her meds. Daughter states that the pt can be contacted with this information.   Thanks

## 2014-09-12 DIAGNOSIS — Z87891 Personal history of nicotine dependence: Secondary | ICD-10-CM | POA: Diagnosis not present

## 2014-09-12 DIAGNOSIS — I6522 Occlusion and stenosis of left carotid artery: Secondary | ICD-10-CM | POA: Diagnosis not present

## 2014-09-12 DIAGNOSIS — K219 Gastro-esophageal reflux disease without esophagitis: Secondary | ICD-10-CM | POA: Diagnosis not present

## 2014-09-12 DIAGNOSIS — Z881 Allergy status to other antibiotic agents status: Secondary | ICD-10-CM | POA: Diagnosis not present

## 2014-09-12 DIAGNOSIS — Z9981 Dependence on supplemental oxygen: Secondary | ICD-10-CM | POA: Diagnosis not present

## 2014-09-12 DIAGNOSIS — J449 Chronic obstructive pulmonary disease, unspecified: Secondary | ICD-10-CM | POA: Diagnosis not present

## 2014-09-12 DIAGNOSIS — Z888 Allergy status to other drugs, medicaments and biological substances status: Secondary | ICD-10-CM | POA: Diagnosis not present

## 2014-09-12 DIAGNOSIS — D122 Benign neoplasm of ascending colon: Secondary | ICD-10-CM | POA: Diagnosis not present

## 2014-09-12 DIAGNOSIS — Z95828 Presence of other vascular implants and grafts: Secondary | ICD-10-CM | POA: Diagnosis not present

## 2014-09-12 DIAGNOSIS — Z1211 Encounter for screening for malignant neoplasm of colon: Secondary | ICD-10-CM | POA: Diagnosis not present

## 2014-09-12 DIAGNOSIS — I4891 Unspecified atrial fibrillation: Secondary | ICD-10-CM | POA: Diagnosis not present

## 2014-09-12 DIAGNOSIS — G8191 Hemiplegia, unspecified affecting right dominant side: Secondary | ICD-10-CM | POA: Diagnosis not present

## 2014-09-12 DIAGNOSIS — Z87442 Personal history of urinary calculi: Secondary | ICD-10-CM | POA: Diagnosis not present

## 2014-09-12 DIAGNOSIS — D124 Benign neoplasm of descending colon: Secondary | ICD-10-CM | POA: Diagnosis not present

## 2014-09-12 DIAGNOSIS — K639 Disease of intestine, unspecified: Secondary | ICD-10-CM | POA: Diagnosis not present

## 2014-09-12 DIAGNOSIS — Z882 Allergy status to sulfonamides status: Secondary | ICD-10-CM | POA: Diagnosis not present

## 2014-09-12 DIAGNOSIS — Z886 Allergy status to analgesic agent status: Secondary | ICD-10-CM | POA: Diagnosis not present

## 2014-09-12 DIAGNOSIS — I739 Peripheral vascular disease, unspecified: Secondary | ICD-10-CM | POA: Diagnosis not present

## 2014-09-12 DIAGNOSIS — Z86010 Personal history of colonic polyps: Secondary | ICD-10-CM | POA: Diagnosis not present

## 2014-09-12 DIAGNOSIS — I10 Essential (primary) hypertension: Secondary | ICD-10-CM | POA: Diagnosis not present

## 2014-09-12 DIAGNOSIS — Z885 Allergy status to narcotic agent status: Secondary | ICD-10-CM | POA: Diagnosis not present

## 2014-09-12 DIAGNOSIS — I639 Cerebral infarction, unspecified: Secondary | ICD-10-CM | POA: Diagnosis not present

## 2014-09-12 DIAGNOSIS — D123 Benign neoplasm of transverse colon: Secondary | ICD-10-CM | POA: Diagnosis not present

## 2014-09-12 DIAGNOSIS — J45909 Unspecified asthma, uncomplicated: Secondary | ICD-10-CM | POA: Diagnosis not present

## 2014-09-16 ENCOUNTER — Telehealth: Payer: Self-pay | Admitting: Gastroenterology

## 2014-09-16 NOTE — Telephone Encounter (Signed)
PT SENT FOR [facility] FOR CECAL POLYP. POLYP NOT FOUND. OPV E30 MAY 2016 RE: POLYP, COLITIS.

## 2014-09-19 ENCOUNTER — Encounter: Payer: Self-pay | Admitting: Gastroenterology

## 2014-09-19 NOTE — Telephone Encounter (Signed)
APPOINTMENT MADE AND LETTER SENT °

## 2014-10-05 ENCOUNTER — Encounter: Payer: Self-pay | Admitting: Gastroenterology

## 2014-10-07 ENCOUNTER — Telehealth: Payer: Self-pay | Admitting: Cardiology

## 2014-10-07 ENCOUNTER — Other Ambulatory Visit: Payer: Self-pay

## 2014-10-07 MED ORDER — CILOSTAZOL 100 MG PO TABS: 100.0000 mg | ORAL_TABLET | Freq: Every day | ORAL | Status: DC

## 2014-10-07 NOTE — Telephone Encounter (Signed)
Patient states [facility] has not heard from their request to refill her Cilostozol 100 mg---90 day supply with 3 refills.  Please confirm with patient when this is completed.

## 2014-11-08 ENCOUNTER — Emergency Department (HOSPITAL_COMMUNITY)
Admission: EM | Admit: 2014-11-08 | Discharge: 2014-11-08 | Disposition: A | Discharge status: Home / Self Care | Payer: Medicare Other | Attending: Emergency Medicine | Admitting: Emergency Medicine

## 2014-11-08 ENCOUNTER — Encounter (HOSPITAL_COMMUNITY): Payer: Self-pay

## 2014-11-08 ENCOUNTER — Emergency Department (HOSPITAL_COMMUNITY): Payer: Medicare Other

## 2014-11-08 DIAGNOSIS — S20212A Contusion of left front wall of thorax, initial encounter: Secondary | ICD-10-CM | POA: Diagnosis not present

## 2014-11-08 DIAGNOSIS — Z7951 Long term (current) use of inhaled steroids: Secondary | ICD-10-CM | POA: Diagnosis not present

## 2014-11-08 DIAGNOSIS — Y9389 Activity, other specified: Secondary | ICD-10-CM | POA: Insufficient documentation

## 2014-11-08 DIAGNOSIS — Y92013 Bedroom of single-family (private) house as the place of occurrence of the external cause: Secondary | ICD-10-CM | POA: Diagnosis not present

## 2014-11-08 DIAGNOSIS — R0781 Pleurodynia: Secondary | ICD-10-CM | POA: Diagnosis not present

## 2014-11-08 DIAGNOSIS — I499 Cardiac arrhythmia, unspecified: Secondary | ICD-10-CM | POA: Diagnosis not present

## 2014-11-08 DIAGNOSIS — K219 Gastro-esophageal reflux disease without esophagitis: Secondary | ICD-10-CM | POA: Diagnosis not present

## 2014-11-08 DIAGNOSIS — Y998 Other external cause status: Secondary | ICD-10-CM | POA: Diagnosis not present

## 2014-11-08 DIAGNOSIS — J449 Chronic obstructive pulmonary disease, unspecified: Secondary | ICD-10-CM | POA: Diagnosis not present

## 2014-11-08 DIAGNOSIS — Z9889 Other specified postprocedural states: Secondary | ICD-10-CM | POA: Insufficient documentation

## 2014-11-08 DIAGNOSIS — W01198A Fall on same level from slipping, tripping and stumbling with subsequent striking against other object, initial encounter: Secondary | ICD-10-CM | POA: Insufficient documentation

## 2014-11-08 DIAGNOSIS — Z880 Allergy status to penicillin: Secondary | ICD-10-CM | POA: Diagnosis not present

## 2014-11-08 DIAGNOSIS — Z79899 Other long term (current) drug therapy: Secondary | ICD-10-CM | POA: Diagnosis not present

## 2014-11-08 DIAGNOSIS — I10 Essential (primary) hypertension: Secondary | ICD-10-CM | POA: Diagnosis not present

## 2014-11-08 DIAGNOSIS — Z8673 Personal history of transient ischemic attack (TIA), and cerebral infarction without residual deficits: Secondary | ICD-10-CM | POA: Insufficient documentation

## 2014-11-08 DIAGNOSIS — S299XXA Unspecified injury of thorax, initial encounter: Secondary | ICD-10-CM | POA: Diagnosis present

## 2014-11-08 DIAGNOSIS — W19XXXA Unspecified fall, initial encounter: Secondary | ICD-10-CM

## 2014-11-08 DIAGNOSIS — Z86718 Personal history of other venous thrombosis and embolism: Secondary | ICD-10-CM | POA: Diagnosis not present

## 2014-11-08 DIAGNOSIS — Z87891 Personal history of nicotine dependence: Secondary | ICD-10-CM | POA: Diagnosis not present

## 2014-11-08 NOTE — ED Notes (Signed)
Patient verbalizes understanding of discharge instructions, home care and follow up care. Patient out of department at this time. 

## 2014-11-08 NOTE — ED Notes (Signed)
Patient states that when she is still her pain is at a 4. But when she moves or takes a deep breath her pain goes to a 9-10. States that she is waiting for her discharge papers.

## 2014-11-08 NOTE — ED Notes (Signed)
Pt reports bent over to get something and slid down her bed post.  C/O pain in left ribs and left side of back.

## 2014-11-08 NOTE — ED Provider Notes (Signed)
CSN: [id number]     Arrival date & time 11/08/14  1800 History  This chart was scribed for Elnora Morrison, MD by Molli Posey, ED Scribe. This patient was seen in room APA09/APA09 and the patient's care was started 9:02 PM.   Chief Complaint  Patient presents with  . Fall   Patient is a 80 y.o. female presenting with fall. The history is provided by the patient. No language interpreter was used.  Fall This is a new problem. The current episode started 12 to 24 hours ago. The problem occurs rarely. The problem has not changed since onset.Pertinent negatives include no abdominal pain. The symptoms are aggravated by bending and twisting. Nothing relieves the symptoms.   HPI Comments: Caroline [name] is a 80 y.o. female with a history of HTN, TIA, COPD, CVA, DVT and atrial fibrillation to the Emergency Department complaining of a fall that occurred today. She says she was in her bedroom when she slid down her bedpost striking her left ribs and left side of her back. Recalls details, tripped. Pt reports no head, hip or knee trauma from the fall. Pt states that she does not fall frequently. She complains of left sided back and rib pain at this time. Pt states that deep breathing and certain movements aggravates her pain. She denies abdominal pain.   Past Medical History  Diagnosis Date  . Hypertension   . GERD (gastroesophageal reflux disease)   . Cerebrovascular disease   . PAD (peripheral artery disease)     lower extremity PAD with bilateral leg stenting  . TIA (transient ischemic attack)   . COPD (chronic obstructive pulmonary disease)   . CVA 10/25/2008    Qualifier: Diagnosis of  By: [name], CNA, Christy    . Internal carotid artery stenosis 05/13/2012    HIGH GRADE LEFT INTERNAL CAROTID STENOSIS.  S/p  L CEA 05/11/12  . Irregular heart beat   . DVT (deep venous thrombosis)   . Atrial fibrillation    Past Surgical History  Procedure Laterality Date  . Partial hysterectomy    . Gallbladder  surgery    . Endarterectomy  05/11/2012    Procedure: ENDARTERECTOMY CAROTID;  Surgeon: [name], MD;  Location: [place];  Service: Vascular;  Laterality: Left;  . Patch angioplasty  05/11/2012    Procedure: PATCH ANGIOPLASTY;  Surgeon: [name], MD;  Location: [facility] OR;  Service: Vascular;  Laterality: Left;  with Dacron Patch Angioplasty  . Carotid angiogram N/A 05/06/2012    Procedure: CAROTID ANGIOGRAM;  Surgeon: [name], MD;  Location: [facility] CATH LAB;  Service: Cardiovascular;  Laterality: N/A;  . Colonoscopy N/A 07/12/2014    [id number] cecal polyp/one colon polyp/redundant left colon  . Esophagogastroduodenoscopy N/A 07/12/2014    [id number] in the lower thrid of the esophagus  . Savory dilation N/A 07/12/2014    Procedure: SAVORY DILATION;  Surgeon: [name], MD;  [facility];  Service: Endoscopy;  Laterality: N/A;  Venia Minks dilation N/A 07/12/2014    Procedure: Venia Minks DILATION;  Surgeon: [name], MD;  [facility];  Service: Endoscopy;  Laterality: N/A;  . Carotid endarterectomy Left Nov. 4, 2013    CE  . Appendectomy  1956  . Cholecystectomy  1966    Gall Bladder   Family History  Problem Relation Age of Onset  . Deep vein thrombosis Sister     Blood Clot in leg  . Alzheimer's disease Brother   .  COPD Sister   . COPD Sister    History  Substance Use Topics  . Smoking status: Former Smoker -- 1.00 packs/day    Quit date: 02/23/2014  . Smokeless tobacco: Former Systems developer  . Alcohol Use: No   OB History    No data available     Review of Systems  Gastrointestinal: Negative for abdominal pain.  Musculoskeletal: Positive for back pain and arthralgias.  All other systems reviewed and are negative.     Allergies  Ibuprofen; Pnu-imune; Advair diskus; Albuterol; Amoxicillin; Cephalexin; Codeine; Entex pac; Etanercept; Gualenic acid; Lorazepam; Nubain; Oxycodone hcl; Oxycodone-acetaminophen; Roxicodone; and Tylox  Home  Medications   Prior to Admission medications   Medication Sig Start Date End Date Taking? Authorizing Provider  budesonide-formoterol (SYMBICORT) 160-4.5 MCG/ACT inhaler Inhale 2 puffs into the lungs 2 (two) times daily.   Yes Historical Provider, MD  cilostazol (PLETAL) 100 MG tablet Take 1 tablet (100 mg total) by mouth daily. Patient taking differently: Take 50 mg by mouth 2 (two) times daily.  10/07/14  Yes Minus Breeding, MD  dabigatran (PRADAXA) 150 MG CAPS capsule Take 150 mg by mouth 2 (two) times daily.   Yes Historical Provider, MD  hydrochlorothiazide (MICROZIDE) 12.5 MG capsule Take 12.5 mg by mouth daily.   Yes Historical Provider, MD  potassium chloride SA (K-DUR,KLOR-CON) 20 MEQ tablet Take 20 mEq by mouth daily.   Yes Historical Provider, MD  tiotropium (SPIRIVA) 18 MCG inhalation capsule Place 18 mcg into inhaler and inhale at bedtime.   Yes Historical Provider, MD  verapamil (CALAN-SR) 240 MG CR tablet Take 240 mg by mouth daily. Takes at lunch time   Yes Historical Provider, MD  ciprofloxacin (CIPRO) 250 MG tablet Take 1 tablet (250 mg total) by mouth 2 (two) times daily. Patient not taking: Reported on 07/26/2014 07/02/14   Erline Hau, MD  levalbuterol Penne Lash) 0.63 MG/3ML nebulizer solution Take 3 mLs (0.63 mg total) by nebulization every 4 (four) hours. Patient not taking: Reported on 07/26/2014 04/18/14   Erline Hau, MD  metroNIDAZOLE (FLAGYL) 500 MG tablet Take 1 tablet (500 mg total) by mouth 3 (three) times daily. Patient not taking: Reported on 11/08/2014 07/02/14   Erline Hau, MD  pantoprazole (PROTONIX) 40 MG tablet 1 po bid for 3 mos then once daily forever Patient not taking: Reported on 07/26/2014 07/12/14   [name], MD   BP 121/69 mmHg  Pulse 89  Temp(Src) 99.8 F (37.7 C) (Oral)  Resp 20  Ht 5\' 4"  (1.626 m)  Wt 94 lb (42.638 kg)  BMI 16.13 kg/m2  SpO2 96% Physical Exam  Constitutional: She is oriented to  person, place, and time. She appears well-developed and well-nourished.  HENT:  Head: Normocephalic and atraumatic.  Eyes: Right eye exhibits no discharge. Left eye exhibits no discharge.  Neck: Neck supple. No tracheal deviation present.  Cardiovascular: Normal rate.   Pulmonary/Chest: Effort normal and breath sounds normal. No respiratory distress. She has no wheezes. She has no rales.  Abdominal: Soft. She exhibits no distension. There is no tenderness.  No peritonitis.   Musculoskeletal: She exhibits tenderness.  Tenderness to posterior lateral ribs on the left. No stepoffs but TTP. Good ROM of left hip. No effusion.   Neurological: She is alert and oriented to person, place, and time.  Skin: Skin is warm and dry.  Psychiatric: She has a normal mood and affect. Her behavior is normal.  Nursing note and vitals reviewed.  ED Course  Procedures   DIAGNOSTIC STUDIES: Oxygen Saturation is 95% on [place], normal by my interpretation.    COORDINATION OF CARE: 9:11 PM Discussed treatment plan with pt at bedside and pt agreed to plan.   Labs Review  Labs Reviewed - No data to display  Imaging Review Dg Ribs Unilateral W/chest Left  11/08/2014   CLINICAL DATA:  80 year old female with left rib and left flank pain after falling onto her bed post earlier today  EXAM: LEFT RIBS AND CHEST - 3+ VIEW  COMPARISON:  Prior chest x-ray 04/16/2014  FINDINGS: Stable remote healed fractures of the posterolateral aspect of left ribs 6 and 7. Cardiac and mediastinal contours remain within normal limits. Atherosclerotic calcification present in the transverse aorta. No evidence of pneumothorax or pleural effusion. Chronic bronchitic change and diffuse interstitial prominence is similar compared to prior. No evidence of acute rib fracture. Surgical clips in the right upper quadrant suggest prior cholecystectomy.  IMPRESSION: Negative for acute rib fracture or other acute abnormality.   Electronically Signed   By:  Jacqulynn Cadet M.D.   On: 11/08/2014 19:27     EKG Interpretation None      MDM   Final diagnoses:  Rib contusion, left, initial encounter  Fall, initial encounter   Patient presents with likely mechanical fall and focal left rib pain. Can discern for rib contusion versus occult fracture. Chest x-ray reviewed no acute fracture seen. Discussed treatment and reasons to return. Patient has no other symptoms at this time, chronic arthritis. No syncope chest pain or shortness of breath. Patient comfortable following up with primary doctor.  Results and differential diagnosis were discussed with the patient/parent/guardian. Close follow up outpatient was discussed, comfortable with the plan.   Medications - No data to display  Filed Vitals:   11/08/14 1852 11/08/14 2141  BP: 136/80 121/69  Pulse: 110 89  Temp: 99.8 F (37.7 C)   TempSrc: Oral   Resp: 24 20  Height: 5\' 4"  (1.626 m)   Weight: 94 lb (42.638 kg)   SpO2: 95% 96%    Final diagnoses:  Rib contusion, left, initial encounter  Fall, initial encounter      Elnora Morrison, MD 11/08/14 2220

## 2014-11-08 NOTE — Discharge Instructions (Signed)
If you were given medicines take as directed.  If you are on coumadin or contraceptives realize their levels and effectiveness is altered by many different medicines.  If you have any reaction (rash, tongues swelling, other) to the medicines stop taking and see a physician.   Use spirometer Please follow up as directed and return to the ER or see a physician for new or worsening symptoms.  Thank you. Filed Vitals:   11/08/14 1852 11/08/14 2141  BP: 136/80 121/69  Pulse: 110 89  Temp: 99.8 F (37.7 C)   TempSrc: Oral   Resp: 24 20  Height: 5\' 4"  (1.626 m)   Weight: 94 lb (42.638 kg)   SpO2: 95% 96%

## 2014-11-09 ENCOUNTER — Ambulatory Visit: Payer: TRICARE For Life (TFL) | Admitting: Gastroenterology

## 2014-11-11 DIAGNOSIS — Z79899 Other long term (current) drug therapy: Secondary | ICD-10-CM | POA: Diagnosis not present

## 2014-11-11 DIAGNOSIS — N764 Abscess of vulva: Secondary | ICD-10-CM | POA: Diagnosis not present

## 2014-11-11 DIAGNOSIS — E781 Pure hyperglyceridemia: Secondary | ICD-10-CM | POA: Diagnosis not present

## 2014-11-11 DIAGNOSIS — Z681 Body mass index (BMI) 19 or less, adult: Secondary | ICD-10-CM | POA: Diagnosis not present

## 2014-11-11 DIAGNOSIS — K219 Gastro-esophageal reflux disease without esophagitis: Secondary | ICD-10-CM | POA: Diagnosis not present

## 2014-11-11 DIAGNOSIS — J449 Chronic obstructive pulmonary disease, unspecified: Secondary | ICD-10-CM | POA: Diagnosis not present

## 2014-11-28 ENCOUNTER — Ambulatory Visit (INDEPENDENT_AMBULATORY_CARE_PROVIDER_SITE_OTHER): Payer: Medicare Other | Admitting: Gastroenterology

## 2014-11-28 ENCOUNTER — Encounter: Payer: Self-pay | Admitting: Gastroenterology

## 2014-11-28 VITALS — BP 125/74 | HR 96 | Temp 97.1°F | Ht 64.0 in | Wt 98.6 lb

## 2014-11-28 DIAGNOSIS — K625 Hemorrhage of anus and rectum: Secondary | ICD-10-CM | POA: Diagnosis not present

## 2014-11-28 DIAGNOSIS — D126 Benign neoplasm of colon, unspecified: Secondary | ICD-10-CM

## 2014-11-28 DIAGNOSIS — I6523 Occlusion and stenosis of bilateral carotid arteries: Secondary | ICD-10-CM | POA: Diagnosis not present

## 2014-11-28 NOTE — Assessment & Plan Note (Signed)
ONE REMOVED VIA TCS/EMR.  NO NEED TO PERFORM TCS IN 3 YRS UNLESS BENEFITS OUTWEIGH THE RISKS EAT FIBER DRINK WATER CALL WITH QUESTIONS OR CONCERNS.

## 2014-11-28 NOTE — Patient Instructions (Signed)
PLEASE CALL WITH QUESTIONS OR CONCERNS. I WILL SEE YOU ON AN AS NEEDED BASIS.  DRINK WATER TO KEEP YOUR URINE LIGHT YELLOW.  FOLLOW A HIGH FIBER DIET. AVOID ITEMS THAT CAUSE BLOATING & GAS. SEE INFO BELOW.

## 2014-11-28 NOTE — Assessment & Plan Note (Signed)
SYMPTOMS CONTROLLED/RESOLVED ON PRADAXA.  CONTINUE TO MONITOR SYMPTOMS AND FOR TRANSFUSION DEPENDENT ANEMIA.

## 2014-11-28 NOTE — Progress Notes (Signed)
Subjective:    Patient ID: Caroline [name], female    DOB: 1934-11-19, 80 y.o.   MRN: [id number]  Glo Herring., MD  HPI HAD TCS [facility]. PREFERS GO-LYTELY([place]) NOT MOVIPREP. HAD MULTI PLE SIMPLE ADENOMAS REMOVED. WAS FEELING GOOD UNTIL SHE TOOK A FALL. OCCASIONAL GAS PAINS. SOB: SAME. BMs: 2-3 TIMES A WEEK DEPENDING ON WHAT SHE EATS. SOME DAYS SHE FORGETS TO EAT. L side pain after falling.  PT DENIES FEVER, CHILLS, HEMATOCHEZIA, nausea, vomiting, melena, diarrhea, CHEST PAIN, constipation, abdominal pain, problems swallowing, OR heartburn or indigestion.   Past Medical History  Diagnosis Date  . Hypertension   . GERD (gastroesophageal reflux disease)   . Cerebrovascular disease   . PAD (peripheral artery disease)     lower extremity PAD with bilateral leg stenting  . TIA (transient ischemic attack)   . COPD (chronic obstructive pulmonary disease)   . CVA 10/25/2008    Qualifier: Diagnosis of  By: [name], CNA, Christy    . Internal carotid artery stenosis 05/13/2012    HIGH GRADE LEFT INTERNAL CAROTID STENOSIS.  S/p  L CEA 05/11/12  . Irregular heart beat   . DVT (deep venous thrombosis)   . Atrial fibrillation     Past Surgical History  Procedure Laterality Date  . Partial hysterectomy    . Gallbladder surgery    . Endarterectomy  05/11/2012    Procedure: ENDARTERECTOMY CAROTID;  Surgeon: [name], MD;  Location: [place];  Service: Vascular;  Laterality: Left;  . Patch angioplasty  05/11/2012    Procedure: PATCH ANGIOPLASTY;  Surgeon: [name], MD;  Location: [facility] OR;  Service: Vascular;  Laterality: Left;  with Dacron Patch Angioplasty  . Carotid angiogram N/A 05/06/2012    Procedure: CAROTID ANGIOGRAM;  Surgeon: [name], MD;  Location: [facility] CATH LAB;  Service: Cardiovascular;  Laterality: N/A;  . Colonoscopy N/A 07/12/2014    [id number] cecal polyp/one colon polyp/redundant left colon  . Esophagogastroduodenoscopy N/A 07/12/2014    [id number] in the lower  thrid of the esophagus  . Savory dilation N/A 07/12/2014    Procedure: SAVORY DILATION;  Surgeon: [name], MD;  [facility];  Service: Endoscopy;  Laterality: N/A;  Venia Minks dilation N/A 07/12/2014    Procedure: Venia Minks DILATION;  Surgeon: [name], MD;  [facility];  Service: Endoscopy;  Laterality: N/A;  . Carotid endarterectomy Left Nov. 4, 2013    CE  . Appendectomy  1956  . Cholecystectomy  1966    Gall Bladder   Allergies  Allergen Reactions  . Ibuprofen     Gi upset  . Pnu-Imune [Pneumococcal Polysaccharide Vaccine]     Huge knot at site and rash  . Advair Diskus [Fluticasone-Salmeterol]     Confusion and shortness of breath  . Albuterol     Confusion and shortness of breath  . Amoxicillin Hives  . Cephalexin Hives  . Codeine     confusion  . Entex Pac [Pseudoephedrine-Gg & Dm] Other (See Comments)    Upper respiratory.   . Etanercept     vertigo  . Gualenic Acid Other (See Comments)    unknown  . Lorazepam     hallucinations  . Nubain [Nalbuphine Hcl]   . Oxycodone Hcl Nausea And Vomiting  . Oxycodone-Acetaminophen Nausea And Vomiting  . Roxicodone [Oxycodone Hcl]   . Tylox [Oxycodone-Acetaminophen]    \ Current Outpatient Prescriptions  Medication Sig Dispense Refill  . budesonide-formoterol (SYMBICORT) 160-4.5 MCG/ACT  inhaler Inhale 2 puffs into the lungs 2 (two) times daily.    . cilostazol (PLETAL) 100 MG tablet Take 1 tablet (100 mg total) by mouth daily. (Patient taking differently: Take 50 mg by mouth 2 (two) times daily. )    .      . dabigatran (PRADAXA) 150 MG CAPS capsule Take 150 mg by mouth 2 (two) times daily.    . hydrochlorothiazide (MICROZIDE) 12.5 MG capsule Take 12.5 mg by mouth daily.    Marland Kitchen levalbuterol (XOPENEX) 0.63 MG/3ML nebulizer solution Take 3 mLs (0.63 mg total) by nebulization every 4 (four) hours.    . pantoprazole (PROTONIX) 40 MG  1 po once daily    . potassium chloride SA (K-DUR,KLOR-CON) 20 MEQ  tablet Take 20 mEq by mouth daily.    Marland Kitchen tiotropium (SPIRIVA) 18 MCG inhalation capsule Place 18 mcg into inhaler and inhale at bedtime.    . verapamil (CALAN-SR) 240 MG CR tablet Take 240 mg by mouth daily. Takes at lunch time     Review of Systems     Objective:   Physical Exam  Constitutional: She is oriented to person, place, and time. She appears well-developed and well-nourished. No distress.  HENT:  Head: Normocephalic and atraumatic.  Mouth/Throat: Oropharynx is clear and moist. No oropharyngeal exudate.  Eyes: Pupils are equal, round, and reactive to light. No scleral icterus.  Neck: Normal range of motion. Neck supple.  Cardiovascular: Normal rate, regular rhythm and normal heart sounds.   Pulmonary/Chest: Effort normal and breath sounds normal. No respiratory distress.  FAIR AIR MOVEMENT BILATERALLY, L SIDE TENDERNESS WITH DEEP BREATH  Abdominal: Soft. Bowel sounds are normal. She exhibits no distension. There is no tenderness.  Musculoskeletal: She exhibits no edema.  Lymphadenopathy:    She has no cervical adenopathy.  Neurological: She is alert and oriented to person, place, and time.  NO  NEW FOCAL DEFICITS   Psychiatric: She has a normal mood and affect.  Vitals reviewed.         Assessment & Plan:

## 2014-11-29 NOTE — Progress Notes (Signed)
cc'ed to pcp °

## 2014-12-23 DIAGNOSIS — H524 Presbyopia: Secondary | ICD-10-CM | POA: Diagnosis not present

## 2014-12-23 DIAGNOSIS — H52223 Regular astigmatism, bilateral: Secondary | ICD-10-CM | POA: Diagnosis not present

## 2014-12-23 DIAGNOSIS — H5203 Hypermetropia, bilateral: Secondary | ICD-10-CM | POA: Diagnosis not present

## 2014-12-23 DIAGNOSIS — H35033 Hypertensive retinopathy, bilateral: Secondary | ICD-10-CM | POA: Diagnosis not present

## 2015-01-17 DIAGNOSIS — H3532 Exudative age-related macular degeneration: Secondary | ICD-10-CM | POA: Diagnosis not present

## 2015-01-17 DIAGNOSIS — H43813 Vitreous degeneration, bilateral: Secondary | ICD-10-CM | POA: Diagnosis not present

## 2015-01-17 DIAGNOSIS — H35052 Retinal neovascularization, unspecified, left eye: Secondary | ICD-10-CM | POA: Diagnosis not present

## 2015-01-17 DIAGNOSIS — H3531 Nonexudative age-related macular degeneration: Secondary | ICD-10-CM | POA: Diagnosis not present

## 2015-01-24 DIAGNOSIS — H35052 Retinal neovascularization, unspecified, left eye: Secondary | ICD-10-CM | POA: Diagnosis not present

## 2015-01-24 DIAGNOSIS — H3532 Exudative age-related macular degeneration: Secondary | ICD-10-CM | POA: Diagnosis not present

## 2015-02-03 DIAGNOSIS — J449 Chronic obstructive pulmonary disease, unspecified: Secondary | ICD-10-CM | POA: Diagnosis not present

## 2015-02-03 DIAGNOSIS — J209 Acute bronchitis, unspecified: Secondary | ICD-10-CM | POA: Diagnosis not present

## 2015-02-03 DIAGNOSIS — Z681 Body mass index (BMI) 19 or less, adult: Secondary | ICD-10-CM | POA: Diagnosis not present

## 2015-02-03 DIAGNOSIS — Z1389 Encounter for screening for other disorder: Secondary | ICD-10-CM | POA: Diagnosis not present

## 2015-02-03 DIAGNOSIS — R590 Localized enlarged lymph nodes: Secondary | ICD-10-CM | POA: Diagnosis not present

## 2015-02-13 DIAGNOSIS — Z681 Body mass index (BMI) 19 or less, adult: Secondary | ICD-10-CM | POA: Diagnosis not present

## 2015-02-13 DIAGNOSIS — B370 Candidal stomatitis: Secondary | ICD-10-CM | POA: Diagnosis not present

## 2015-02-13 DIAGNOSIS — Z1389 Encounter for screening for other disorder: Secondary | ICD-10-CM | POA: Diagnosis not present

## 2015-02-15 NOTE — Patient Outreach (Signed)
[facility]) Care Management  02/15/2015  Caroline [name] Dec 10, 1934 [id number]   Referral from Florien List, assigned Sherrin Daisy, RN to outreach.  Thanks, Ronnell Freshwater. [place], [place] Assistant Phone: [phone] Fax: [phone]

## 2015-02-28 DIAGNOSIS — H35052 Retinal neovascularization, unspecified, left eye: Secondary | ICD-10-CM | POA: Diagnosis not present

## 2015-02-28 DIAGNOSIS — H3531 Nonexudative age-related macular degeneration: Secondary | ICD-10-CM | POA: Diagnosis not present

## 2015-02-28 DIAGNOSIS — H43813 Vitreous degeneration, bilateral: Secondary | ICD-10-CM | POA: Diagnosis not present

## 2015-02-28 DIAGNOSIS — H3532 Exudative age-related macular degeneration: Secondary | ICD-10-CM | POA: Diagnosis not present

## 2015-03-01 ENCOUNTER — Other Ambulatory Visit (HOSPITAL_COMMUNITY): Payer: Self-pay | Admitting: Physician Assistant

## 2015-03-01 DIAGNOSIS — Z1389 Encounter for screening for other disorder: Secondary | ICD-10-CM

## 2015-03-03 ENCOUNTER — Other Ambulatory Visit (HOSPITAL_COMMUNITY): Payer: Self-pay | Admitting: Physician Assistant

## 2015-03-03 DIAGNOSIS — Z780 Asymptomatic menopausal state: Secondary | ICD-10-CM

## 2015-03-03 DIAGNOSIS — M8580 Other specified disorders of bone density and structure, unspecified site: Secondary | ICD-10-CM

## 2015-03-03 DIAGNOSIS — Z1389 Encounter for screening for other disorder: Secondary | ICD-10-CM

## 2015-03-09 ENCOUNTER — Other Ambulatory Visit (HOSPITAL_COMMUNITY): Payer: TRICARE For Life (TFL)

## 2015-03-10 ENCOUNTER — Encounter: Payer: Self-pay | Admitting: *Deleted

## 2015-03-10 ENCOUNTER — Other Ambulatory Visit: Payer: Self-pay | Admitting: *Deleted

## 2015-03-10 NOTE — Patient Outreach (Signed)
[facility]) Care Management  03/10/2015  Caroline [name] 20-Feb-1935 [id number]   High Risk referral: Telephone call to patient who gave HIPPA verification.  Advised of referral and of [facility] care management services.  Patient states she has no health care concerns. States she sees primary care provider and specialist providers a regular intervals. Has no problems with transportation to doctors' appointments.   Uses mail order pharmacy for medications without problems. Taking medications as prescribed.  States she has  support from her family.  Patient is declining [facility] care management services at this time.  Plan: will close case and send MD closure letter.  Caroline Daisy, RN BSN [place] Management Coordinator [facility] Care Management  [phone]

## 2015-03-15 NOTE — Patient Outreach (Signed)
[facility]) Care Management  03/15/2015  CYNITHA BERTE 1934-11-13 [id number]   Notification from Sherrin Daisy, RN to close case due to patient refused [place] Management services.  Thanks, Ronnell Freshwater. [place], [place] Assistant Phone: [phone] Fax: [phone]

## 2015-04-04 DIAGNOSIS — H35052 Retinal neovascularization, unspecified, left eye: Secondary | ICD-10-CM | POA: Diagnosis not present

## 2015-04-04 DIAGNOSIS — H3532 Exudative age-related macular degeneration: Secondary | ICD-10-CM | POA: Diagnosis not present

## 2015-04-04 DIAGNOSIS — H43813 Vitreous degeneration, bilateral: Secondary | ICD-10-CM | POA: Diagnosis not present

## 2015-04-04 DIAGNOSIS — H3531 Nonexudative age-related macular degeneration: Secondary | ICD-10-CM | POA: Diagnosis not present

## 2015-05-16 DIAGNOSIS — H353221 Exudative age-related macular degeneration, left eye, with active choroidal neovascularization: Secondary | ICD-10-CM | POA: Diagnosis not present

## 2015-05-16 DIAGNOSIS — H43813 Vitreous degeneration, bilateral: Secondary | ICD-10-CM | POA: Diagnosis not present

## 2015-05-16 DIAGNOSIS — H353112 Nonexudative age-related macular degeneration, right eye, intermediate dry stage: Secondary | ICD-10-CM | POA: Diagnosis not present

## 2015-05-22 DIAGNOSIS — H34211 Partial retinal artery occlusion, right eye: Secondary | ICD-10-CM | POA: Diagnosis not present

## 2015-05-22 DIAGNOSIS — H353221 Exudative age-related macular degeneration, left eye, with active choroidal neovascularization: Secondary | ICD-10-CM | POA: Diagnosis not present

## 2015-05-22 DIAGNOSIS — H2513 Age-related nuclear cataract, bilateral: Secondary | ICD-10-CM | POA: Diagnosis not present

## 2015-05-22 DIAGNOSIS — H04123 Dry eye syndrome of bilateral lacrimal glands: Secondary | ICD-10-CM | POA: Diagnosis not present

## 2015-05-22 DIAGNOSIS — H353112 Nonexudative age-related macular degeneration, right eye, intermediate dry stage: Secondary | ICD-10-CM | POA: Diagnosis not present

## 2015-05-22 DIAGNOSIS — D2211 Melanocytic nevi of right eyelid, including canthus: Secondary | ICD-10-CM | POA: Diagnosis not present

## 2015-05-22 DIAGNOSIS — D3132 Benign neoplasm of left choroid: Secondary | ICD-10-CM | POA: Diagnosis not present

## 2015-05-25 DIAGNOSIS — Z681 Body mass index (BMI) 19 or less, adult: Secondary | ICD-10-CM | POA: Diagnosis not present

## 2015-05-25 DIAGNOSIS — R0902 Hypoxemia: Secondary | ICD-10-CM | POA: Diagnosis not present

## 2015-05-25 DIAGNOSIS — I739 Peripheral vascular disease, unspecified: Secondary | ICD-10-CM | POA: Diagnosis not present

## 2015-05-25 DIAGNOSIS — Z1389 Encounter for screening for other disorder: Secondary | ICD-10-CM | POA: Diagnosis not present

## 2015-05-25 DIAGNOSIS — Z23 Encounter for immunization: Secondary | ICD-10-CM | POA: Diagnosis not present

## 2015-05-25 DIAGNOSIS — J449 Chronic obstructive pulmonary disease, unspecified: Secondary | ICD-10-CM | POA: Diagnosis not present

## 2015-06-21 ENCOUNTER — Ambulatory Visit (INDEPENDENT_AMBULATORY_CARE_PROVIDER_SITE_OTHER): Payer: Medicare Other | Admitting: Cardiology

## 2015-06-21 ENCOUNTER — Encounter: Payer: Self-pay | Admitting: Cardiology

## 2015-06-21 VITALS — BP 131/79 | HR 103 | Ht 64.0 in | Wt 95.0 lb

## 2015-06-21 DIAGNOSIS — I480 Paroxysmal atrial fibrillation: Secondary | ICD-10-CM | POA: Diagnosis not present

## 2015-06-21 DIAGNOSIS — I6523 Occlusion and stenosis of bilateral carotid arteries: Secondary | ICD-10-CM

## 2015-06-21 NOTE — Patient Instructions (Signed)

## 2015-06-21 NOTE — Progress Notes (Signed)
HPI The patient presents for followup of atrial fibrillation and PVD.   Since I last saw her she has done well.  She is on O2 at home but she says that she feels well.  She reports that she breaths comfortably with her oxygen on. She's not having any chest pressure, neck or arm discomfort. She doesn't notice any palpitations and she has no presyncope or syncope. She has had weight loss over she's been well. She doesn't have any edema.  Her biggest complaint continues to be back pain.    Allergies  Allergen Reactions  . Ibuprofen     Gi upset  . Pnu-Imune [Pneumococcal Polysaccharide Vaccine]     Huge knot at site and rash  . Advair Diskus [Fluticasone-Salmeterol]     Confusion and shortness of breath  . Albuterol     Confusion and shortness of breath  . Amoxicillin Hives  . Cephalexin Hives  . Codeine     confusion  . Entex Pac [Pseudoephedrine-Gg & Dm] Other (See Comments)    Upper respiratory.   . Etanercept     vertigo  . Gualenic Acid Other (See Comments)    unknown  . Lorazepam     hallucinations  . Nubain [Nalbuphine Hcl]   . Oxycodone Hcl Nausea And Vomiting  . Oxycodone-Acetaminophen Nausea And Vomiting  . Roxicodone [Oxycodone Hcl]   . Tylox [Oxycodone-Acetaminophen]     Current Outpatient Prescriptions  Medication Sig Dispense Refill  . cilostazol (PLETAL) 100 MG tablet Take 1 tablet (100 mg total) by mouth daily. (Patient taking differently: Take 50 mg by mouth 2 (two) times daily. ) 30 tablet 6  . dabigatran (PRADAXA) 150 MG CAPS capsule Take 150 mg by mouth 2 (two) times daily.    . hydrochlorothiazide (MICROZIDE) 12.5 MG capsule Take 12.5 mg by mouth daily.    . pantoprazole (PROTONIX) 40 MG tablet 1 po bid for 3 mos then once daily forever (Patient taking differently: Take 40 mg by mouth daily. 1 po bid for 3 mos then once daily forever) 60 tablet 11  . potassium chloride SA (K-DUR,KLOR-CON) 20 MEQ tablet Take 20 mEq by mouth daily.    Marland Kitchen tiotropium (SPIRIVA)  18 MCG inhalation capsule Place 18 mcg into inhaler and inhale at bedtime.    . verapamil (CALAN-SR) 240 MG CR tablet Take 240 mg by mouth daily. Takes at lunch time    . levalbuterol (XOPENEX) 0.63 MG/3ML nebulizer solution Take 3 mLs (0.63 mg total) by nebulization every 4 (four) hours. 3 mL 12   No current facility-administered medications for this visit.    Past Medical History  Diagnosis Date  . Hypertension   . GERD (gastroesophageal reflux disease)   . Cerebrovascular disease   . PAD (peripheral artery disease) (HCC)     lower extremity PAD with bilateral leg stenting  . TIA (transient ischemic attack)   . COPD (chronic obstructive pulmonary disease) ([place])   . CVA 10/25/2008    Qualifier: Diagnosis of  By: [name], CNA, Christy    . Internal carotid artery stenosis 05/13/2012    HIGH GRADE LEFT INTERNAL CAROTID STENOSIS.  S/p  L CEA 05/11/12  . Irregular heart beat   . DVT (deep venous thrombosis) ([place])   . Atrial fibrillation [facility])     Past Surgical History  Procedure Laterality Date  . Partial hysterectomy    . Gallbladder surgery    . Endarterectomy  05/11/2012    Procedure: ENDARTERECTOMY CAROTID;  Surgeon: Jessy Oto  Fields, MD;  Location: [place];  Service: Vascular;  Laterality: Left;  . Patch angioplasty  05/11/2012    Procedure: PATCH ANGIOPLASTY;  Surgeon: [name], MD;  Location: [facility] OR;  Service: Vascular;  Laterality: Left;  with Dacron Patch Angioplasty  . Carotid angiogram N/A 05/06/2012    Procedure: CAROTID ANGIOGRAM;  Surgeon: [name], MD;  Location: [facility] CATH LAB;  Service: Cardiovascular;  Laterality: N/A;  . Colonoscopy N/A 07/12/2014    [id number] cecal polyp/one colon polyp/redundant left colon  . Esophagogastroduodenoscopy N/A 07/12/2014    [id number] in the lower thrid of the esophagus  . Savory dilation N/A 07/12/2014    Procedure: SAVORY DILATION;  Surgeon: [name], MD;  [facility];  Service: Endoscopy;  Laterality: N/A;  Venia Minks dilation N/A 07/12/2014    Procedure: Venia Minks DILATION;  Surgeon: [name], MD;  [facility];  Service: Endoscopy;  Laterality: N/A;  . Carotid endarterectomy Left Nov. 4, 2013    CE  . Appendectomy  1956  . Cholecystectomy  1966    Gall Bladder    ROS:  She has a point of tenderness on her back.  Otherwise as stated in the HPI and negative for all other systems.  PHYSICAL EXAM BP 131/79 mmHg  Pulse 103  Ht 5\' 4"  (1.626 m)  Wt 95 lb (43.092 kg)  BMI 16.30 kg/m2 GENERAL:  Well appearing HEENT:  Pupils equal round and reactive, fundi not visualized, oral mucosa unremarkable, dentures NECK:  No jugular venous distention, waveform within normal limits, carotid upstroke brisk and symmetric, no thyromegaly LYMPHATICS:  No cervical, inguinal adenopathy LUNGS:  Clear to auscultation bilaterally, decreased BS CHEST:  Unremarkable HEART:  PMI not displaced or sustained,S1 and S2 within normal limits, no S3, no clicks, no rubs, no murmurs, irregular ABD:  Flat, positive bowel sounds normal in frequency in pitch, no bruits, no rebound, no guarding, no midline pulsatile mass, no hepatomegaly, no splenomegaly EXT:  2 plus pulses upper and diminished bilateral PT/DP, no edema, no cyanosis no clubbing  ATRIAL FIB:  Atrial fib, rate 106, no acute ST T wave changes, poor anterior R wave progression.  06/21/2015   ASSESSMENT AND PLAN  ATRIAL FIBRILLATION -  The patient  tolerates this rhythm and rate control and anticoagulation. We will continue with the meds as listed.  HYPERTENSION - Her blood pressures have been at target.  No change in therapy is indicated.  PVD -  We will continue with risk reduction.    TOBACCO ABUSE -  She is still using vapors and not smoking.Marland Kitchen    CAROTID ARTERY DISEASE -  I will defer follow up to [facility].

## 2015-06-29 DIAGNOSIS — H2512 Age-related nuclear cataract, left eye: Secondary | ICD-10-CM | POA: Diagnosis not present

## 2015-06-29 HISTORY — PX: EYE SURGERY: SHX253

## 2015-07-19 DIAGNOSIS — H353221 Exudative age-related macular degeneration, left eye, with active choroidal neovascularization: Secondary | ICD-10-CM | POA: Diagnosis not present

## 2015-07-19 DIAGNOSIS — H353112 Nonexudative age-related macular degeneration, right eye, intermediate dry stage: Secondary | ICD-10-CM | POA: Diagnosis not present

## 2015-07-19 DIAGNOSIS — H43813 Vitreous degeneration, bilateral: Secondary | ICD-10-CM | POA: Diagnosis not present

## 2015-07-19 DIAGNOSIS — D3132 Benign neoplasm of left choroid: Secondary | ICD-10-CM | POA: Diagnosis not present

## 2015-07-26 ENCOUNTER — Encounter: Payer: Self-pay | Admitting: Family

## 2015-08-03 ENCOUNTER — Ambulatory Visit (HOSPITAL_COMMUNITY)
Admission: RE | Admit: 2015-08-03 | Discharge: 2015-08-03 | Disposition: A | Discharge status: Home / Self Care | Payer: Medicare Other | Source: Ambulatory Visit | Attending: Family | Admitting: Family

## 2015-08-03 ENCOUNTER — Ambulatory Visit (INDEPENDENT_AMBULATORY_CARE_PROVIDER_SITE_OTHER): Payer: Medicare Other | Admitting: Family

## 2015-08-03 ENCOUNTER — Encounter: Payer: Self-pay | Admitting: Family

## 2015-08-03 VITALS — BP 105/68 | HR 104 | Temp 97.4°F | Resp 16 | Ht 64.0 in | Wt 96.0 lb

## 2015-08-03 DIAGNOSIS — I6529 Occlusion and stenosis of unspecified carotid artery: Secondary | ICD-10-CM | POA: Insufficient documentation

## 2015-08-03 DIAGNOSIS — I10 Essential (primary) hypertension: Secondary | ICD-10-CM | POA: Diagnosis not present

## 2015-08-03 DIAGNOSIS — Z48812 Encounter for surgical aftercare following surgery on the circulatory system: Secondary | ICD-10-CM

## 2015-08-03 DIAGNOSIS — Z9889 Other specified postprocedural states: Secondary | ICD-10-CM

## 2015-08-03 DIAGNOSIS — I739 Peripheral vascular disease, unspecified: Secondary | ICD-10-CM | POA: Diagnosis not present

## 2015-08-03 DIAGNOSIS — R0989 Other specified symptoms and signs involving the circulatory and respiratory systems: Secondary | ICD-10-CM

## 2015-08-03 DIAGNOSIS — Z87891 Personal history of nicotine dependence: Secondary | ICD-10-CM

## 2015-08-03 DIAGNOSIS — I6521 Occlusion and stenosis of right carotid artery: Secondary | ICD-10-CM | POA: Diagnosis not present

## 2015-08-03 DIAGNOSIS — Z7901 Long term (current) use of anticoagulants: Secondary | ICD-10-CM | POA: Diagnosis not present

## 2015-08-03 NOTE — Progress Notes (Signed)
Chief Complaint: Extracranial Carotid Artery Stenosis   History of Present Illness  Caroline [name] is a 81 y.o. female patient of Dr. Oneida Alar who is status post left carotid endarterectomy in November, 2013.  She had a preoperative stroke. She has mild residual loss of dexterity in her right hand. She had no monocular loss of vision, no speech difficulties. She not had any subsequent stroke or TIA.  She returns today for carotid artery surveillance and follow up.  She denies claudication symptoms with walking, denies non-healing wounds. Pt states she had an arterial stent placed in her left thigh region over 10 years ago at an outside facility.    She denies light-headedness or dizziness unless she stands up too fast.   Her husband has severe health issues which is a stressor for her. 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 Is using 2L/[place] supplemental oxygen 24/7.  Patient reports New Medical or Surgical History: lens replacement left eye, injections in her left eye for macular degeneration.   She has much less coughing since she changed from cigarettes to vapor nicotine She lost 3 pounds in a year, but states her appetite is voracious, eats all day, denies post prandial abdominal pain.    Pt Diabetic: yes, "borderline" Pt smoker: former smoker x 60 years (stopped August 2015, is using vapor cigs with nicotine, is weaning down)  Pt meds include: Statin : No ASA: No Other anticoagulants/antiplatelets: Pradaxa prescribed by her cardiologist for atrial fibrillation. Taking Pletal prescribed by Dr. Percival Spanish.     Past Medical History  Diagnosis Date  . Hypertension   . GERD (gastroesophageal reflux disease)   . Cerebrovascular disease   . PAD (peripheral artery disease) (HCC)     lower extremity PAD with bilateral leg stenting  . TIA (transient ischemic attack)   . COPD (chronic  obstructive pulmonary disease) ([place])   . CVA 10/25/2008    Qualifier: Diagnosis of  By: [name], CNA, Christy    . Internal carotid artery stenosis 05/13/2012    HIGH GRADE LEFT INTERNAL CAROTID STENOSIS.  S/p  L CEA 05/11/12  . Irregular heart beat   . DVT (deep venous thrombosis) ([place])   . Atrial fibrillation [facility])     Social History Social History  Substance Use Topics  . Smoking status: Former Smoker -- 1.00 packs/day    Quit date: 02/23/2014  . Smokeless tobacco: Former Systems developer  . Alcohol Use: No    Family History Family History  Problem Relation Age of Onset  . Deep vein thrombosis Sister     Blood Clot in leg  . Alzheimer's disease Brother   . COPD Sister   . COPD Sister     Surgical History Past Surgical History  Procedure Laterality Date  . Partial hysterectomy    . Gallbladder surgery    . Endarterectomy  05/11/2012    Procedure: ENDARTERECTOMY CAROTID;  Surgeon: [name], MD;  Location: [place];  Service: Vascular;  Laterality: Left;  . Patch angioplasty  05/11/2012    Procedure: PATCH ANGIOPLASTY;  Surgeon: [name], MD;  Location: [facility] OR;  Service: Vascular;  Laterality: Left;  with Dacron Patch Angioplasty  . Carotid angiogram N/A 05/06/2012    Procedure: CAROTID ANGIOGRAM;  Surgeon: [name], MD;  Location: [facility] CATH LAB;  Service: Cardiovascular;  Laterality: N/A;  . Colonoscopy N/A 07/12/2014    [id number]  cecal polyp/one colon polyp/redundant left colon  . Esophagogastroduodenoscopy N/A 07/12/2014    [id number] in the lower thrid of the esophagus  . Savory dilation N/A 07/12/2014    Procedure: SAVORY DILATION;  Surgeon: [name], MD;  [facility];  Service: Endoscopy;  Laterality: N/A;  Venia Minks dilation N/A 07/12/2014    Procedure: Venia Minks DILATION;  Surgeon: [name], MD;  [facility];  Service: Endoscopy;  Laterality: N/A;  . Carotid endarterectomy Left Nov. 4, 2013    CE  . Appendectomy  1956  .  Cholecystectomy  1966    Gall Bladder    Allergies  Allergen Reactions  . Ibuprofen     Gi upset  . Pnu-Imune [Pneumococcal Polysaccharide Vaccine]     Huge knot at site and rash  . Advair Diskus [Fluticasone-Salmeterol]     Confusion and shortness of breath  . Albuterol     Confusion and shortness of breath  . Amoxicillin Hives  . Cephalexin Hives  . Codeine     confusion  . Entex Pac [Pseudoephedrine-Gg & Dm] Other (See Comments)    Upper respiratory.   . Etanercept     vertigo  . Gualenic Acid Other (See Comments)    unknown  . Lorazepam     hallucinations  . Nubain [Nalbuphine Hcl]   . Oxycodone Hcl Nausea And Vomiting  . Oxycodone-Acetaminophen Nausea And Vomiting  . Roxicodone [Oxycodone Hcl]   . Tylox [Oxycodone-Acetaminophen]     Current Outpatient Prescriptions  Medication Sig Dispense Refill  . cilostazol (PLETAL) 100 MG tablet Take 1 tablet (100 mg total) by mouth daily. (Patient taking differently: Take 50 mg by mouth 2 (two) times daily. ) 30 tablet 6  . dabigatran (PRADAXA) 150 MG CAPS capsule Take 150 mg by mouth 2 (two) times daily.    . hydrochlorothiazide (MICROZIDE) 12.5 MG capsule Take 12.5 mg by mouth daily.    Marland Kitchen levalbuterol (XOPENEX) 0.63 MG/3ML nebulizer solution Take 3 mLs (0.63 mg total) by nebulization every 4 (four) hours. 3 mL 12  . pantoprazole (PROTONIX) 40 MG tablet 1 po bid for 3 mos then once daily forever (Patient taking differently: Take 40 mg by mouth daily. 1 po bid for 3 mos then once daily forever) 60 tablet 11  . potassium chloride SA (K-DUR,KLOR-CON) 20 MEQ tablet Take 20 mEq by mouth daily.    Marland Kitchen tiotropium (SPIRIVA) 18 MCG inhalation capsule Place 18 mcg into inhaler and inhale at bedtime.    . verapamil (CALAN-SR) 240 MG CR tablet Take 240 mg by mouth daily. Takes at lunch time     No current facility-administered medications for this visit.    Review of Systems : See HPI for pertinent positives and negatives.  Physical  Examination  Filed Vitals:   08/03/15 1133 08/03/15 1136  BP: 95/62 105/68  Pulse: 107 104  Temp:  97.4 F (36.3 C)  TempSrc:  Oral  Resp:  16  Height:  5\' 4"  (1.626 m)  Weight:  96 lb (43.545 kg)  SpO2:  96%   Body mass index is 16.47 kg/(m^2).    General: WDWN thin female in NAD. GAIT: slow, steady Eyes: PERRLA Pulmonary: Minimal air movement in all fields, + rales in bases, no rhonchi auscultated, no cough. Wearing [place] for supplemental oxygen  Cardiac: irregular rhythm, no detected murmur.  VASCULAR EXAM Carotid Bruits Left Right   Negative Negative   Aorta is strongly palpable. Radial pulses are 2+ palpable and  equal.      LE Pulses LEFT RIGHT       FEMORAL 2+ palpable  1+ palpable   POPLITEAL  not palpable  not palpable   POSTERIOR TIBIAL not palpable, + Doppler signal  not palpable, + Doppler signal    DORSALIS PEDIS  ANTERIOR TIBIAL not palpable, + Doppler signal  not palpable, + Doppler signal     Gastrointestinal: soft, nontender, BS WNL, no r/g, no palpable masses.  Musculoskeletal: + muscle atrophy/wasting, is thin. M/S 4/5 throughout except 3/5 in right LE, Extremities without ischemic changes. Moderate kyphosis.   Neurologic: A&O X 3; Appropriate Affect ; SENSATION ;normal;  Speech is normal CN 2-12 intact, Pain and light touch intact in extremities, Motor exam as listed above.          Non-Invasive Vascular Imaging CAROTID DUPLEX 08/03/2015   CEREBROVASCULAR DUPLEX EVALUATION    INDICATION: Carotid endarterectomy     PREVIOUS INTERVENTION(S): Left carotid endarterectomy on 05/11/12    DUPLEX EXAM:     RIGHT  LEFT  Peak Systolic Velocities (cm/s) End Diastolic Velocities (cm/s) Plaque LOCATION Peak Systolic Velocities (cm/s) End Diastolic Velocities  (cm/s) Plaque  86 14 HT CCA PROXIMAL 61 15 HT  92 18 HT CCA MID 73 15 HT  63 15 HT CCA DISTAL 65 17 HM  84 7 HT ECA 70 6   60 18 HT ICA PROXIMAL 49 14   50 18  ICA MID 48 3   49 15  ICA DISTAL 44 13     0.9 ICA / CCA Ratio (PSV) Not Calculated  Antegrade Vertebral Flow Antegrade   Brachial Systolic Pressure (mmHg)   Multiphasic (subclavian artery) Brachial Artery Waveforms Multiphasic (subclavian artery)    Plaque Morphology:  HM = Homogeneous, HT = Heterogeneous, CP = Calcific Plaque, SP = Smooth Plaque, IP = Irregular Plaque     ADDITIONAL FINDINGS: No significant stenosis of the bilateral external or common carotid arteries.    IMPRESSION: 1. Patent left carotid endarterectomy site with no left internal carotid artery stenosis. 2. Doppler velocities suggest a 1-39% stenosis of the right proximal internal carotid artery.    Compared to the previous exam:  No significant change noted when compared to the previous exam on 07/26/2014.       Assessment: Caroline [name] is a 81 y.o. female who is status post left carotid endarterectomy in November, 2013.  She had a preoperative stroke. She has mild residual loss of dexterity in her right hand. She had no monocular loss of vision, no speech difficulties. She not had any subsequent stroke or TIA.  Today's carotid duplex suggests left carotid endarterectomy site with no left internal carotid artery stenosis and 1-39% stenosis of the right proximal internal carotid artery. No significant change noted when compared to the previous exam on 07/26/2014.   Dr. Warren Lacy has been monitoring patient's small AAA.   She reports a remote history of left upper leg arterial stent placement, she has no claudication symptoms, no signs of ischemia in her feet or legs. I am unable to palpate pedal pulses but Doppler signals are present in both DP and PT arteries. She is taking Pletal prescribed by her cardiologist. No ABI's or LE arterial results on  file.  I advised pt to speak with her PCP or cardiologist re her low blood pressure, but she is mostly asymptomatic, only feels light headed on standing quickly.   Plan: Follow-up in 1 year with Carotid Duplex scan and bilateral  ABI.   I discussed in depth with the patient the nature of atherosclerosis, and emphasized the importance of maximal medical management including strict control of blood pressure, blood glucose, and lipid levels, obtaining regular exercise, and continued cessation of smoking.  The patient is aware that without maximal medical management the underlying atherosclerotic disease process will progress, limiting the benefit of any interventions. The patient was given information about stroke prevention and what symptoms should prompt the patient to seek immediate medical care. Thank you for allowing [place] to participate in this patient's care.  Clemon Chambers, RN, MSN, FNP-C [facility] Office: [phone]  Clinic Physician: Oneida Alar  08/03/2015 11:13 AM

## 2015-08-03 NOTE — Patient Instructions (Signed)
Stroke Prevention Some medical conditions and behaviors are associated with an increased chance of having a stroke. You may prevent a stroke by making healthy choices and managing medical conditions. HOW CAN I REDUCE MY RISK OF HAVING A STROKE?   Stay physically active. Get at least 30 minutes of activity on most or all days.  Do not smoke. It may also be helpful to avoid exposure to secondhand smoke.  Limit alcohol use. Moderate alcohol use is considered to be:  No more than 2 drinks per day for men.  No more than 1 drink per day for nonpregnant women.  Eat healthy foods. This involves:  Eating 5 or more servings of fruits and vegetables a day.  Making dietary changes that address high blood pressure (hypertension), high cholesterol, diabetes, or obesity.  Manage your cholesterol levels.  Making food choices that are high in fiber and low in saturated fat, trans fat, and cholesterol may control cholesterol levels.  Take any prescribed medicines to control cholesterol as directed by your health care provider.  Manage your diabetes.  Controlling your carbohydrate and sugar intake is recommended to manage diabetes.  Take any prescribed medicines to control diabetes as directed by your health care provider.  Control your hypertension.  Making food choices that are low in salt (sodium), saturated fat, trans fat, and cholesterol is recommended to manage hypertension.  Ask your health care provider if you need treatment to lower your blood pressure. Take any prescribed medicines to control hypertension as directed by your health care provider.  If you are 18-39 years of age, have your blood pressure checked every 3-5 years. If you are 40 years of age or older, have your blood pressure checked every year.  Maintain a healthy weight.  Reducing calorie intake and making food choices that are low in sodium, saturated fat, trans fat, and cholesterol are recommended to manage  weight.  Stop drug abuse.  Avoid taking birth control pills.  Talk to your health care provider about the risks of taking birth control pills if you are over 35 years old, smoke, get migraines, or have ever had a blood clot.  Get evaluated for sleep disorders (sleep apnea).  Talk to your health care provider about getting a sleep evaluation if you snore a lot or have excessive sleepiness.  Take medicines only as directed by your health care provider.  For some people, aspirin or blood thinners (anticoagulants) are helpful in reducing the risk of forming abnormal blood clots that can lead to stroke. If you have the irregular heart rhythm of atrial fibrillation, you should be on a blood thinner unless there is a good reason you cannot take them.  Understand all your medicine instructions.  Make sure that other conditions (such as anemia or atherosclerosis) are addressed. SEEK IMMEDIATE MEDICAL CARE IF:   You have sudden weakness or numbness of the face, arm, or leg, especially on one side of the body.  Your face or eyelid droops to one side.  You have sudden confusion.  You have trouble speaking (aphasia) or understanding.  You have sudden trouble seeing in one or both eyes.  You have sudden trouble walking.  You have dizziness.  You have a loss of balance or coordination.  You have a sudden, severe headache with no known cause.  You have new chest pain or an irregular heartbeat. Any of these symptoms may represent a serious problem that is an emergency. Do not wait to see if the symptoms will   go away. Get medical help at once. Call your local emergency services (911 in [place] Do not drive yourself to the hospital.   This information is not intended to replace advice given to you by your health care provider. Make sure you discuss any questions you have with your health care provider.   Document Released: 08/01/2004 Document Revised: 07/15/2014 Document Reviewed:  12/25/2012 Elsevier Interactive Patient Education 2016 Elsevier Inc.  

## 2015-08-03 NOTE — Addendum Note (Signed)
Addended by: Dorthula Rue L on: 08/03/2015 01:19 PM   Modules accepted: Orders

## 2015-09-05 DIAGNOSIS — Z0000 Encounter for general adult medical examination without abnormal findings: Secondary | ICD-10-CM | POA: Diagnosis not present

## 2015-09-05 DIAGNOSIS — Z1389 Encounter for screening for other disorder: Secondary | ICD-10-CM | POA: Diagnosis not present

## 2015-09-05 DIAGNOSIS — Z79899 Other long term (current) drug therapy: Secondary | ICD-10-CM | POA: Diagnosis not present

## 2015-09-05 DIAGNOSIS — Z681 Body mass index (BMI) 19 or less, adult: Secondary | ICD-10-CM | POA: Diagnosis not present

## 2015-10-04 DIAGNOSIS — H353113 Nonexudative age-related macular degeneration, right eye, advanced atrophic without subfoveal involvement: Secondary | ICD-10-CM | POA: Diagnosis not present

## 2015-10-04 DIAGNOSIS — D3132 Benign neoplasm of left choroid: Secondary | ICD-10-CM | POA: Diagnosis not present

## 2015-10-04 DIAGNOSIS — H353221 Exudative age-related macular degeneration, left eye, with active choroidal neovascularization: Secondary | ICD-10-CM | POA: Diagnosis not present

## 2015-10-04 DIAGNOSIS — H35421 Microcystoid degeneration of retina, right eye: Secondary | ICD-10-CM | POA: Diagnosis not present

## 2015-10-17 ENCOUNTER — Other Ambulatory Visit: Payer: Self-pay | Admitting: *Deleted

## 2015-10-17 DIAGNOSIS — Z681 Body mass index (BMI) 19 or less, adult: Secondary | ICD-10-CM | POA: Diagnosis not present

## 2015-10-17 DIAGNOSIS — R5383 Other fatigue: Secondary | ICD-10-CM | POA: Diagnosis not present

## 2015-10-17 DIAGNOSIS — K219 Gastro-esophageal reflux disease without esophagitis: Secondary | ICD-10-CM | POA: Diagnosis not present

## 2015-10-17 DIAGNOSIS — I7389 Other specified peripheral vascular diseases: Secondary | ICD-10-CM | POA: Diagnosis not present

## 2015-10-17 DIAGNOSIS — D519 Vitamin B12 deficiency anemia, unspecified: Secondary | ICD-10-CM | POA: Diagnosis not present

## 2015-10-17 DIAGNOSIS — Z1389 Encounter for screening for other disorder: Secondary | ICD-10-CM | POA: Diagnosis not present

## 2015-10-17 DIAGNOSIS — I638 Other cerebral infarction: Secondary | ICD-10-CM | POA: Diagnosis not present

## 2015-10-17 DIAGNOSIS — J449 Chronic obstructive pulmonary disease, unspecified: Secondary | ICD-10-CM | POA: Diagnosis not present

## 2015-10-17 NOTE — Patient Outreach (Signed)
[place] [facility]) Care Management  10/17/2015  Caroline [name] 1934-08-13 [id number]   Referral from patient's daughter; states patient is aware of referral:  Telephone call to patient; left message voice mail requesting call back.  Plan: will follow up with call back.  Sherrin Daisy, RN BSN [place] Management Coordinator [facility] Care Management  [phone]

## 2015-10-19 ENCOUNTER — Other Ambulatory Visit: Payer: Self-pay | Admitting: *Deleted

## 2015-10-19 NOTE — Patient Outreach (Signed)
[place] [facility]) Care Management  10/19/2015  Caroline [name] 06/22/1935 [id number]   [place] [facility]) Care Management  Caroline [name]  10/19/2015   Caroline [name] March 20, 1935 [id number]   Referral from patient's daughter:  Subjective:  Telephone call to patient who was advised of reason for call and of [facility] care management services. Patient advised RN CM that daughter had referred to our services in error. States she only needed assistance with house cleaning and getting a bath several times a week. States she did not have any nursing needs. States she does have COPD and is dependent on oxygen around the clock. States medical condition is under control.  States she knows what to do in case of problems, when to call MD office and when to call 911. States no problems with obtaining medications or transportation to MD office. States spouse or daughter takes her to appointments. States she prepares own medications and takes consistently as instructed by MD. Patient agrees to screening assessment. Patient is declining [facility] care management services at this time. Objective: see medication list.   Encounter Medications:  Outpatient Encounter Prescriptions as of 10/19/2015  Medication Sig  . cilostazol (PLETAL) 100 MG tablet Take 1 tablet (100 mg total) by mouth daily. (Patient taking differently: Take 50 mg by mouth 2 (two) times daily. )  . dabigatran (PRADAXA) 150 MG CAPS capsule Take 150 mg by mouth 2 (two) times daily.  . hydrochlorothiazide (MICROZIDE) 12.5 MG capsule Take 12.5 mg by mouth daily.  Marland Kitchen levalbuterol (XOPENEX) 0.63 MG/3ML nebulizer solution Take 3 mLs (0.63 mg total) by nebulization every 4 (four) hours. (Patient not taking: Reported on 08/03/2015)  . pantoprazole (PROTONIX) 40 MG tablet 1 po bid for 3 mos then once daily forever (Patient not taking: Reported on 08/03/2015)  . potassium chloride SA (K-DUR,KLOR-CON) 20 MEQ tablet Take 20 mEq by mouth  daily.  Marland Kitchen tiotropium (SPIRIVA) 18 MCG inhalation capsule Place 18 mcg into inhaler and inhale at bedtime.  . verapamil (CALAN-SR) 240 MG CR tablet Take 240 mg by mouth daily. Takes at lunch time   No facility-administered encounter medications on file as of 10/19/2015.    Functional Status:  Walking independently but does have access to cane or walker if needed.  Is independent with personal care but needs assistance getting in & out of shower/tub. Prepares meals.  Fall/Depression Screening: PHQ 2/9 Scores 10/19/2015  PHQ - 2 Score 1  No falls in last 3 months.  Assessment:  Patient managing own medications and chronic medical condition. Sees MD as recommended by her primary care provider.  Consistent with taking medications . Only needs community resource information on assistance with baths & house cleaning. Declines [facility] case management services.   Plan: Send list of agencies that provide aide services for personal  care and house cleaning. Advised patient that she would need to call agencies and advise of services she needs to get quotes on cost. Patient voices understanding.  Will send [facility] contact information. Close out case.  Sherrin Daisy, RN BSN CCM Care Management Coordinator [facility] Care Management  [phone]  .

## 2015-10-23 ENCOUNTER — Encounter: Payer: Self-pay | Admitting: *Deleted

## 2015-10-23 ENCOUNTER — Other Ambulatory Visit: Payer: Self-pay | Admitting: *Deleted

## 2015-11-10 DIAGNOSIS — H43813 Vitreous degeneration, bilateral: Secondary | ICD-10-CM | POA: Diagnosis not present

## 2015-11-10 DIAGNOSIS — H353220 Exudative age-related macular degeneration, left eye, stage unspecified: Secondary | ICD-10-CM | POA: Diagnosis not present

## 2016-01-24 ENCOUNTER — Emergency Department (HOSPITAL_COMMUNITY): Payer: Medicare Other

## 2016-01-24 ENCOUNTER — Inpatient Hospital Stay (HOSPITAL_COMMUNITY)
Admission: EM | Admit: 2016-01-24 | Discharge: 2016-01-27 | DRG: 551 | Disposition: A | Discharge status: Home / Self Care | Payer: Medicare Other | Attending: Internal Medicine | Admitting: Internal Medicine

## 2016-01-24 ENCOUNTER — Encounter (HOSPITAL_COMMUNITY): Payer: Self-pay | Admitting: Emergency Medicine

## 2016-01-24 DIAGNOSIS — K5900 Constipation, unspecified: Secondary | ICD-10-CM | POA: Diagnosis present

## 2016-01-24 DIAGNOSIS — R52 Pain, unspecified: Secondary | ICD-10-CM | POA: Diagnosis not present

## 2016-01-24 DIAGNOSIS — I959 Hypotension, unspecified: Secondary | ICD-10-CM | POA: Diagnosis not present

## 2016-01-24 DIAGNOSIS — E43 Unspecified severe protein-calorie malnutrition: Secondary | ICD-10-CM

## 2016-01-24 DIAGNOSIS — Y92009 Unspecified place in unspecified non-institutional (private) residence as the place of occurrence of the external cause: Secondary | ICD-10-CM

## 2016-01-24 DIAGNOSIS — Z79899 Other long term (current) drug therapy: Secondary | ICD-10-CM

## 2016-01-24 DIAGNOSIS — M25551 Pain in right hip: Secondary | ICD-10-CM

## 2016-01-24 DIAGNOSIS — W19XXXA Unspecified fall, initial encounter: Secondary | ICD-10-CM

## 2016-01-24 DIAGNOSIS — W010XXA Fall on same level from slipping, tripping and stumbling without subsequent striking against object, initial encounter: Secondary | ICD-10-CM | POA: Diagnosis present

## 2016-01-24 DIAGNOSIS — M25552 Pain in left hip: Secondary | ICD-10-CM | POA: Diagnosis not present

## 2016-01-24 DIAGNOSIS — I482 Chronic atrial fibrillation, unspecified: Secondary | ICD-10-CM | POA: Diagnosis present

## 2016-01-24 DIAGNOSIS — Y92099 Unspecified place in other non-institutional residence as the place of occurrence of the external cause: Secondary | ICD-10-CM

## 2016-01-24 DIAGNOSIS — K921 Melena: Secondary | ICD-10-CM | POA: Diagnosis not present

## 2016-01-24 DIAGNOSIS — Z9981 Dependence on supplemental oxygen: Secondary | ICD-10-CM

## 2016-01-24 DIAGNOSIS — Z8673 Personal history of transient ischemic attack (TIA), and cerebral infarction without residual deficits: Secondary | ICD-10-CM

## 2016-01-24 DIAGNOSIS — Z87891 Personal history of nicotine dependence: Secondary | ICD-10-CM

## 2016-01-24 DIAGNOSIS — I4892 Unspecified atrial flutter: Secondary | ICD-10-CM | POA: Diagnosis not present

## 2016-01-24 DIAGNOSIS — R102 Pelvic and perineal pain: Secondary | ICD-10-CM | POA: Diagnosis not present

## 2016-01-24 DIAGNOSIS — Z86718 Personal history of other venous thrombosis and embolism: Secondary | ICD-10-CM

## 2016-01-24 DIAGNOSIS — I714 Abdominal aortic aneurysm, without rupture, unspecified: Secondary | ICD-10-CM | POA: Diagnosis present

## 2016-01-24 DIAGNOSIS — K649 Unspecified hemorrhoids: Secondary | ICD-10-CM | POA: Diagnosis present

## 2016-01-24 DIAGNOSIS — E875 Hyperkalemia: Secondary | ICD-10-CM | POA: Diagnosis not present

## 2016-01-24 DIAGNOSIS — M545 Low back pain, unspecified: Secondary | ICD-10-CM | POA: Diagnosis present

## 2016-01-24 DIAGNOSIS — E871 Hypo-osmolality and hyponatremia: Secondary | ICD-10-CM | POA: Diagnosis present

## 2016-01-24 DIAGNOSIS — J439 Emphysema, unspecified: Secondary | ICD-10-CM | POA: Diagnosis present

## 2016-01-24 DIAGNOSIS — S3993XA Unspecified injury of pelvis, initial encounter: Secondary | ICD-10-CM | POA: Diagnosis not present

## 2016-01-24 DIAGNOSIS — J9611 Chronic respiratory failure with hypoxia: Secondary | ICD-10-CM | POA: Diagnosis not present

## 2016-01-24 DIAGNOSIS — K5641 Fecal impaction: Secondary | ICD-10-CM | POA: Diagnosis present

## 2016-01-24 DIAGNOSIS — S3992XA Unspecified injury of lower back, initial encounter: Secondary | ICD-10-CM | POA: Diagnosis not present

## 2016-01-24 DIAGNOSIS — M4686 Other specified inflammatory spondylopathies, lumbar region: Secondary | ICD-10-CM | POA: Diagnosis not present

## 2016-01-24 DIAGNOSIS — K219 Gastro-esophageal reflux disease without esophagitis: Secondary | ICD-10-CM | POA: Diagnosis present

## 2016-01-24 DIAGNOSIS — I10 Essential (primary) hypertension: Secondary | ICD-10-CM | POA: Diagnosis present

## 2016-01-24 DIAGNOSIS — M5489 Other dorsalgia: Secondary | ICD-10-CM | POA: Diagnosis not present

## 2016-01-24 DIAGNOSIS — S39012A Strain of muscle, fascia and tendon of lower back, initial encounter: Secondary | ICD-10-CM | POA: Diagnosis not present

## 2016-01-24 DIAGNOSIS — R262 Difficulty in walking, not elsewhere classified: Secondary | ICD-10-CM

## 2016-01-24 DIAGNOSIS — J438 Other emphysema: Secondary | ICD-10-CM

## 2016-01-24 DIAGNOSIS — M47819 Spondylosis without myelopathy or radiculopathy, site unspecified: Secondary | ICD-10-CM | POA: Diagnosis present

## 2016-01-24 DIAGNOSIS — Z681 Body mass index (BMI) 19 or less, adult: Secondary | ICD-10-CM

## 2016-01-24 DIAGNOSIS — K5909 Other constipation: Secondary | ICD-10-CM

## 2016-01-24 DIAGNOSIS — R339 Retention of urine, unspecified: Secondary | ICD-10-CM | POA: Diagnosis not present

## 2016-01-24 DIAGNOSIS — I2510 Atherosclerotic heart disease of native coronary artery without angina pectoris: Secondary | ICD-10-CM | POA: Diagnosis present

## 2016-01-24 DIAGNOSIS — J449 Chronic obstructive pulmonary disease, unspecified: Secondary | ICD-10-CM | POA: Diagnosis present

## 2016-01-24 DIAGNOSIS — I739 Peripheral vascular disease, unspecified: Secondary | ICD-10-CM

## 2016-01-24 DIAGNOSIS — E440 Moderate protein-calorie malnutrition: Secondary | ICD-10-CM | POA: Diagnosis present

## 2016-01-24 HISTORY — DX: Abdominal aortic aneurysm, without rupture: I71.4

## 2016-01-24 HISTORY — DX: Abdominal aortic aneurysm, without rupture, unspecified: I71.40

## 2016-01-24 LAB — CBC WITH DIFFERENTIAL/PLATELET
Basophils Absolute: 0.1 10*3/uL (ref 0.0–0.1)
Basophils Relative: 1 %
EOS PCT: 4 %
Eosinophils Absolute: 0.3 10*3/uL (ref 0.0–0.7)
HCT: 35.6 % — ABNORMAL LOW (ref 36.0–46.0)
Hemoglobin: 11.7 g/dL — ABNORMAL LOW (ref 12.0–15.0)
LYMPHS ABS: 1.4 10*3/uL (ref 0.7–4.0)
LYMPHS PCT: 18 %
MCH: 26.8 pg (ref 26.0–34.0)
MCHC: 32.9 g/dL (ref 30.0–36.0)
MCV: 81.5 fL (ref 78.0–100.0)
MONO ABS: 0.5 10*3/uL (ref 0.1–1.0)
Monocytes Relative: 7 %
Neutro Abs: 5.4 10*3/uL (ref 1.7–7.7)
Neutrophils Relative %: 70 %
Platelets: 203 10*3/uL (ref 150–400)
RBC: 4.37 MIL/uL (ref 3.87–5.11)
RDW: 14.7 % (ref 11.5–15.5)
WBC: 7.7 10*3/uL (ref 4.0–10.5)

## 2016-01-24 LAB — BASIC METABOLIC PANEL
Anion gap: 7 (ref 5–15)
BUN: 13 mg/dL (ref 6–20)
CHLORIDE: 97 mmol/L — AB (ref 101–111)
CO2: 32 mmol/L (ref 22–32)
CREATININE: 0.6 mg/dL (ref 0.44–1.00)
Calcium: 8.8 mg/dL — ABNORMAL LOW (ref 8.9–10.3)
GFR calc Af Amer: >60 mL/min (ref >60)
GFR calc non Af Amer: >60 mL/min (ref >60)
GLUCOSE: 94 mg/dL (ref 65–99)
POTASSIUM: 3.9 mmol/L (ref 3.5–5.1)
Sodium: 136 mmol/L (ref 135–145)

## 2016-01-24 LAB — URINALYSIS, ROUTINE W REFLEX MICROSCOPIC
Bilirubin Urine: NEGATIVE
GLUCOSE, UA: NEGATIVE mg/dL
HGB URINE DIPSTICK: NEGATIVE
Leukocytes, UA: NEGATIVE
Nitrite: NEGATIVE
Protein, ur: NEGATIVE mg/dL
SPECIFIC GRAVITY, URINE: 1.02 (ref 1.005–1.030)
pH: 6 (ref 5.0–8.0)

## 2016-01-24 MED ORDER — DABIGATRAN ETEXILATE MESYLATE 75 MG PO CAPS
150.0000 mg | ORAL_CAPSULE | Freq: Two times a day (BID) | ORAL | Status: DC
  Administered 2016-01-24 – 2016-01-26 (×6): 150 mg via ORAL
  Filled 2016-01-24 (×7): qty 2

## 2016-01-24 MED ORDER — ONDANSETRON HCL 4 MG/2ML IJ SOLN
4.0000 mg | Freq: Four times a day (QID) | INTRAMUSCULAR | Status: DC | PRN
  Administered 2016-01-24: 4 mg via INTRAVENOUS
  Filled 2016-01-24: qty 2

## 2016-01-24 MED ORDER — CILOSTAZOL 100 MG PO TABS
50.0000 mg | ORAL_TABLET | Freq: Two times a day (BID) | ORAL | Status: DC
  Administered 2016-01-24 – 2016-01-27 (×7): 50 mg via ORAL
  Filled 2016-01-24: qty 1
  Filled 2016-01-24 (×2): qty 0.5
  Filled 2016-01-24 (×2): qty 1
  Filled 2016-01-24 (×3): qty 0.5
  Filled 2016-01-24: qty 1
  Filled 2016-01-24: qty 0.5
  Filled 2016-01-24: qty 1

## 2016-01-24 MED ORDER — POTASSIUM CHLORIDE CRYS ER 20 MEQ PO TBCR
20.0000 meq | EXTENDED_RELEASE_TABLET | Freq: Every day | ORAL | Status: DC
  Administered 2016-01-24: 20 meq via ORAL
  Filled 2016-01-24: qty 1

## 2016-01-24 MED ORDER — VERAPAMIL HCL ER 240 MG PO TBCR
240.0000 mg | EXTENDED_RELEASE_TABLET | Freq: Every day | ORAL | Status: DC
  Administered 2016-01-24 – 2016-01-27 (×3): 240 mg via ORAL
  Filled 2016-01-24 (×4): qty 1

## 2016-01-24 MED ORDER — FENTANYL CITRATE (PF) 100 MCG/2ML IJ SOLN
50.0000 ug | Freq: Once | INTRAMUSCULAR | Status: AC
  Administered 2016-01-24: 50 ug via INTRAVENOUS
  Filled 2016-01-24: qty 2

## 2016-01-24 MED ORDER — FENTANYL CITRATE (PF) 100 MCG/2ML IJ SOLN
50.0000 ug | INTRAMUSCULAR | Status: DC | PRN
  Administered 2016-01-24 – 2016-01-25 (×3): 50 ug via INTRAVENOUS
  Filled 2016-01-24 (×3): qty 2

## 2016-01-24 MED ORDER — POLYETHYLENE GLYCOL 3350 17 G PO PACK
17.0000 g | PACK | Freq: Every day | ORAL | Status: DC
  Administered 2016-01-24: 17 g via ORAL
  Filled 2016-01-24: qty 1

## 2016-01-24 MED ORDER — PREDNISONE 10 MG PO TABS
15.0000 mg | ORAL_TABLET | Freq: Two times a day (BID) | ORAL | Status: DC
Start: 2016-01-24 — End: 2016-01-25
  Administered 2016-01-24 – 2016-01-25 (×3): 15 mg via ORAL
  Filled 2016-01-24 (×3): qty 2

## 2016-01-24 MED ORDER — PANTOPRAZOLE SODIUM 40 MG PO TBEC
40.0000 mg | DELAYED_RELEASE_TABLET | Freq: Every day | ORAL | Status: DC
  Administered 2016-01-24 – 2016-01-27 (×4): 40 mg via ORAL
  Filled 2016-01-24 (×7): qty 1

## 2016-01-24 MED ORDER — TRAMADOL-ACETAMINOPHEN 37.5-325 MG PO TABS
1.0000 | ORAL_TABLET | Freq: Two times a day (BID) | ORAL | Status: DC
  Administered 2016-01-24 – 2016-01-27 (×7): 1 via ORAL
  Filled 2016-01-24 (×7): qty 1

## 2016-01-24 MED ORDER — HYDROCHLOROTHIAZIDE 12.5 MG PO CAPS
12.5000 mg | ORAL_CAPSULE | Freq: Every day | ORAL | Status: DC
  Administered 2016-01-24 – 2016-01-27 (×3): 12.5 mg via ORAL
  Filled 2016-01-24 (×4): qty 1

## 2016-01-24 MED ORDER — ENSURE ENLIVE PO LIQD
237.0000 mL | Freq: Two times a day (BID) | ORAL | Status: DC
  Administered 2016-01-24: 237 mL via ORAL

## 2016-01-24 MED ORDER — ACETAMINOPHEN 325 MG PO TABS
650.0000 mg | ORAL_TABLET | Freq: Two times a day (BID) | ORAL | Status: DC
  Administered 2016-01-24 – 2016-01-27 (×7): 650 mg via ORAL
  Filled 2016-01-24 (×7): qty 2

## 2016-01-24 MED ORDER — TIOTROPIUM BROMIDE MONOHYDRATE 18 MCG IN CAPS
18.0000 ug | ORAL_CAPSULE | Freq: Every day | RESPIRATORY_TRACT | Status: DC
  Administered 2016-01-24 – 2016-01-26 (×3): 18 ug via RESPIRATORY_TRACT
  Filled 2016-01-24: qty 5

## 2016-01-24 MED ORDER — SENNOSIDES-DOCUSATE SODIUM 8.6-50 MG PO TABS
2.0000 | ORAL_TABLET | Freq: Every day | ORAL | Status: DC
  Administered 2016-01-24 – 2016-01-26 (×3): 2 via ORAL
  Filled 2016-01-24 (×3): qty 2

## 2016-01-24 MED ORDER — LEVALBUTEROL HCL 0.63 MG/3ML IN NEBU
0.6300 mg | INHALATION_SOLUTION | Freq: Four times a day (QID) | RESPIRATORY_TRACT | Status: DC
  Administered 2016-01-24: 0.63 mg via RESPIRATORY_TRACT
  Filled 2016-01-24 (×2): qty 3

## 2016-01-24 MED ORDER — LEVALBUTEROL HCL 0.63 MG/3ML IN NEBU
0.6300 mg | INHALATION_SOLUTION | Freq: Four times a day (QID) | RESPIRATORY_TRACT | Status: DC
  Administered 2016-01-25 – 2016-01-26 (×4): 0.63 mg via RESPIRATORY_TRACT
  Filled 2016-01-24 (×4): qty 3

## 2016-01-24 MED ORDER — BISACODYL 10 MG RE SUPP
10.0000 mg | Freq: Once | RECTAL | Status: AC
  Administered 2016-01-24: 10 mg via RECTAL
  Filled 2016-01-24: qty 1

## 2016-01-24 MED ORDER — POTASSIUM CHLORIDE IN NACL 20-0.9 MEQ/L-% IV SOLN
INTRAVENOUS | Status: DC
  Administered 2016-01-24: 15:00:00 via INTRAVENOUS

## 2016-01-24 MED ORDER — LEVALBUTEROL HCL 0.63 MG/3ML IN NEBU: 0.6300 mg | INHALATION_SOLUTION | Freq: Four times a day (QID) | RESPIRATORY_TRACT | Status: DC | PRN

## 2016-01-24 NOTE — ED Notes (Addendum)
Tripped and fell over O2 concentrator.  C/o pain to lower back and bilateral hips.  Pt a&o x 4

## 2016-01-24 NOTE — Progress Notes (Signed)
Patient arrived to unit. VSS. Patient is in no distress/pain. Order/chart reviewed. RN will continue to monitor.   

## 2016-01-24 NOTE — ED Provider Notes (Signed)
CSN: [id number]     Arrival date & time 01/24/16  0548 History   First MD Initiated Contact with Patient 01/24/16 [phone]     Chief Complaint  Patient presents with  . Fall    Patient is a 81 y.o. female presenting with fall. The history is provided by the patient.  Fall This is a new problem. The current episode started 1 to 2 hours ago. The problem occurs constantly. The problem has been gradually worsening. Pertinent negatives include no chest pain, no abdominal pain and no headaches. Exacerbated by: movement. The symptoms are relieved by rest. She has tried rest for the symptoms. The treatment provided mild relief.  Patient presents s/p fall She reports she was getting up from couch earlier this morning She tripped over oxygen concentrator (she wears 2L nasal cannula at home) She fell on her "side" No LOC No head injury She has pain in back and her pelvis No cp/abd pain No HA No neck pain   Past Medical History  Diagnosis Date  . Hypertension   . GERD (gastroesophageal reflux disease)   . Cerebrovascular disease   . PAD (peripheral artery disease) (HCC)     lower extremity PAD with bilateral leg stenting  . TIA (transient ischemic attack)   . COPD (chronic obstructive pulmonary disease) ([place])   . CVA 10/25/2008    Qualifier: Diagnosis of  By: [name], CNA, Christy    . Internal carotid artery stenosis 05/13/2012    HIGH GRADE LEFT INTERNAL CAROTID STENOSIS.  S/p  L CEA 05/11/12  . Irregular heart beat   . DVT (deep venous thrombosis) ([place])   . Atrial fibrillation [facility])    Past Surgical History  Procedure Laterality Date  . Partial hysterectomy    . Gallbladder surgery    . Endarterectomy  05/11/2012    Procedure: ENDARTERECTOMY CAROTID;  Surgeon: [name], MD;  Location: [place];  Service: Vascular;  Laterality: Left;  . Patch angioplasty  05/11/2012    Procedure: PATCH ANGIOPLASTY;  Surgeon: [name], MD;  Location: [facility] OR;  Service: Vascular;  Laterality: Left;   with Dacron Patch Angioplasty  . Carotid angiogram N/A 05/06/2012    Procedure: CAROTID ANGIOGRAM;  Surgeon: [name], MD;  [facility] CATH LAB;  Service: Cardiovascular;  Laterality: N/A;  . Colonoscopy N/A 07/12/2014    [id number] cecal polyp/one colon polyp/redundant left colon  . Esophagogastroduodenoscopy N/A 07/12/2014    [id number] in the lower thrid of the esophagus  . Savory dilation N/A 07/12/2014    Procedure: SAVORY DILATION;  Surgeon: [name], MD;  Location: [facility];  Service: Endoscopy;  Laterality: N/A;  Venia Minks dilation N/A 07/12/2014    Procedure: Venia Minks DILATION;  Surgeon: [name], MD;  Location: [facility];  Service: Endoscopy;  Laterality: N/A;  . Carotid endarterectomy Left Nov. 4, 2013    CE  . Appendectomy  1956  . Cholecystectomy  1966    Gall Bladder  . Eye surgery Left Dec. 22, 2016    Cataract   Family History  Problem Relation Age of Onset  . Deep vein thrombosis Sister     Blood Clot in leg  . Alzheimer's disease Brother   . COPD Sister   . COPD Sister    Social History  Substance Use Topics  . Smoking status: Former Smoker -- 1.00 packs/day    Quit date: 02/23/2014  . Smokeless tobacco: Former Systems developer  . Alcohol Use: No  OB History    No data available     Review of Systems  Cardiovascular: Negative for chest pain.  Gastrointestinal: Negative for abdominal pain.  Musculoskeletal: Positive for back pain and arthralgias.  Neurological: Negative for headaches.  All other systems reviewed and are negative.     Allergies  Ibuprofen; Pnu-imune; Advair diskus; Albuterol; Amoxicillin; Cephalexin; Codeine; Entex pac; Etanercept; Gualenic acid; Lorazepam; Nubain; Oxycodone hcl; Oxycodone-acetaminophen; Roxicodone; and Tylox  Home Medications   Prior to Admission medications   Medication Sig Start Date End Date Taking? Authorizing Provider  cilostazol (PLETAL) 100 MG tablet Take 1 tablet (100 mg total) by mouth  daily. Patient taking differently: Take 50 mg by mouth 2 (two) times daily.  10/07/14   Minus Breeding, MD  dabigatran (PRADAXA) 150 MG CAPS capsule Take 150 mg by mouth 2 (two) times daily.    Historical Provider, MD  hydrochlorothiazide (MICROZIDE) 12.5 MG capsule Take 12.5 mg by mouth daily.    Historical Provider, MD  levalbuterol Penne Lash) 0.63 MG/3ML nebulizer solution Take 3 mLs (0.63 mg total) by nebulization every 4 (four) hours. Patient not taking: Reported on 08/03/2015 04/18/14   Erline Hau, MD  pantoprazole (PROTONIX) 40 MG tablet 1 po bid for 3 mos then once daily forever Patient not taking: Reported on 08/03/2015 07/12/14   [name], MD  potassium chloride SA (K-DUR,KLOR-CON) 20 MEQ tablet Take 20 mEq by mouth daily.    Historical Provider, MD  tiotropium (SPIRIVA) 18 MCG inhalation capsule Place 18 mcg into inhaler and inhale at bedtime.    Historical Provider, MD  verapamil (CALAN-SR) 240 MG CR tablet Take 240 mg by mouth daily. Takes at lunch time    Historical Provider, MD   BP 124/66 mmHg  Pulse 103  Temp(Src) 97.9 F (36.6 C) (Oral)  Resp 22  Ht 5\' 4"  (1.626 m)  Wt 43.092 kg  BMI 16.30 kg/m2  SpO2 94% Physical Exam CONSTITUTIONAL: Frail, elderly, no acute distress lying on her side HEAD: Normocephalic/atraumatic EYES: EOMI/PERRL ENMT: Mucous membranes moist NECK: supple no meningeal signs SPINE/BACK:no cervical or thoracic tenderness.  Mild tenderness to lumbar spine.  No bruising/crepitance/stepoffs noted to spine CV: S1/S2 noted LUNGS: Lungs are clear to auscultation bilaterally Chest - no tenderness or bruising noted ABDOMEN: soft, nontender, no bruising NEURO: Pt is awake/alert/appropriate, moves all extremitiesx4.     EXTREMITIES: pulses normal/equal, pelvis stable.  No deformity to extremities. She has tenderness with ROM of both hips.  No tenderness to palpation of either knee/foot SKIN: warm, color normal PSYCH: no abnormalities of mood  noted, alert and oriented to situation  ED Course  Procedures  Medications  fentaNYL (SUBLIMAZE) injection 50 mcg (50 mcg Intravenous Given 01/24/16 0636)    Labs Review Labs Reviewed  BASIC METABOLIC PANEL - Abnormal; Notable for the following:    Chloride 97 (*)    Calcium 8.8 (*)    All other components within normal limits  CBC WITH DIFFERENTIAL/PLATELET - Abnormal; Notable for the following:    Hemoglobin 11.7 (*)    HCT 35.6 (*)    All other components within normal limits    Imaging Review Dg Lumbar Spine Complete  01/24/2016  CLINICAL DATA:  Status post trip and fall this morning with onset of low back pain. Initial encounter. EXAM: LUMBAR SPINE - COMPLETE 4+ VIEW COMPARISON:  CT abdomen and pelvis 06/30/2014. FINDINGS: There is no fracture. Trace anterolisthesis L5 on S1 due to facet arthropathy is unchanged. Intervertebral disc space  height is maintained. The patient has an abdominal aortic aneurysm measuring 4.4 cm AP, increased from approximately 3.1 cm on the prior examination. Large colonic stool burden is noted. IMPRESSION: No acute abnormality. 4.4 cm abdominal aortic aneurysm is increased from 3.1 cm on the prior exam. Recommend follow-up ultrasound in 1 year. This recommendation follows ACR consensus guidelines. Large colonic stool burden. Electronically Signed   By: Inge Rise M.D.   On: 01/24/2016 07:12   Dg Pelvis 1-2 Views  01/24/2016  CLINICAL DATA:  Status post trip and fall this morning with onset of pelvic pain. Initial encounter. EXAM: PELVIS - 1-2 VIEW COMPARISON:  CT abdomen and pelvis 06/30/2014. FINDINGS: There is no evidence of pelvic fracture or diastasis. No pelvic bone lesions are seen. Bones are osteopenic. Large colonic stool burden is noted. IMPRESSION: No acute abnormality. Osteopenia. Large colonic stool burden. Electronically Signed   By: Inge Rise M.D.   On: 01/24/2016 07:13   I have personally reviewed and evaluated these images and  lab results as part of my medical decision-making.  7:20 AM Pt improved Xray negative, but does have increased AAA size and will need f/u ultrasound 7:41 AM While attempting to get out of bed pt had immediate pain in her low back No other new pain complaints She has no obvious weakness to her legs However mobility limited due to low back pain She reports this is worse than normal Initial xray negative.  Advised mri, but pt reports she can not tolerate mri Will start with CT imaging of lumbar spine At signout to dr cook, f/u on CT imaging If pt unable to ambulate or has significant pain, may need admission   MDM   Final diagnoses:  None    Nursing notes including past medical history and social history reviewed and considered in documentation xrays/imaging reviewed by myself and considered during evaluation Labs/vital reviewed myself and considered during evaluation     Ripley Fraise, MD 01/24/16 [phone]

## 2016-01-24 NOTE — ED Notes (Signed)
Patient transported to CT via stretcher. Taken with Oxygen at 3 L per [place], daughter left to get breakfast at this time

## 2016-01-24 NOTE — Progress Notes (Signed)
Received report from ED nurse regarding patient coming to rm [place], RN

## 2016-01-24 NOTE — ED Notes (Signed)
Pt unable to ambulate due to pain. Pt sat up on side of bed with difficulty. EDP at bedside.

## 2016-01-24 NOTE — Discharge Instructions (Signed)
YOU WILL NEED FOLLOWUP ULTRASOUND OF YOUR ABDOMEN TO EVALUATE YOUR AORTA.  PLEASE SPEAK TO YOUR PRIMARY DOCTOR ABOUT THIS TEST PLEASE HAVE THIS PERFORMED IN NEXT 6 MONTHS

## 2016-01-24 NOTE — Progress Notes (Signed)
Pain to back and hips from fall rating 10 at this time. States pain med Fentanyl has been working but only lasts about one hour. Nad.

## 2016-01-24 NOTE — Progress Notes (Signed)
Pt sleeping. Mumbles that she feels better

## 2016-01-24 NOTE — ED Notes (Signed)
Returned from CT scan, vss pain only with movement 3 at rest . Awaiting CT results

## 2016-01-24 NOTE — H&P (Signed)
History and Physical    Caroline [name] [id number] DOB: April 22, 1935 DOA: 01/24/2016  PCP: Glo Herring., MD  Patient coming from: Home  Chief Complaint: Lower back pain following a fall at home  HPI: Caroline [name] is a 81 y.o. female with medical history significant for oxygen dependent COPD on 2-3 L of oxygen, TIA, hypertension, CAD, DVT, PVD, and chronic atrial fibrillation on Pradaxa, who presents to the ED after falling at home this morning. Accordingly, when she tried to get up to go to the bathroom early this morning, she tripped on her oxygen concentrator and fell on her buttocks without being able to brace her fall. Then she rolled over to her left side. She felt immediate pain in her mid to lower back and her left hip. The pain radiates to both legs. She denies numbness in her legs. She denies bladder or bowel incontinence. She describes the pain as throbbing and occasionally sharp. It is 10 over 10 in intensity. With movement, the pain causes nausea. She had one episode of emesis. She denies any loss of consciousness or head injury. She denies any preceding chest pain, palpitations, or worsening shortness of breath.  ED Course: In the ED, she was afebrile and hemodynamically stable. CT of her lumbar spine revealed widespread multilevel arthropathy and abdominal aortic aneurysm measuring 4.3 x 4.0 cm. Her pelvis x-ray revealed no acute abnormality is, but with a large colonic stool burden. She is being admitted for further evaluation and management.  Review of Systems: As per HPI otherwise 10 point review of systems negative.    Past Medical History  Diagnosis Date  . Hypertension   . GERD (gastroesophageal reflux disease)   . Cerebrovascular disease   . PAD (peripheral artery disease) (HCC)     lower extremity PAD with bilateral leg stenting  . TIA (transient ischemic attack)   . COPD (chronic obstructive pulmonary disease) ([place])   . CVA 10/25/2008    Qualifier: Diagnosis of   By: [name], CNA, Christy    . Internal carotid artery stenosis 05/13/2012    HIGH GRADE LEFT INTERNAL CAROTID STENOSIS.  S/p  L CEA 05/11/12  . Irregular heart beat   . DVT (deep venous thrombosis) ([place])   . Atrial fibrillation [facility])     Past Surgical History  Procedure Laterality Date  . Partial hysterectomy    . Gallbladder surgery    . Endarterectomy  05/11/2012    Procedure: ENDARTERECTOMY CAROTID;  Surgeon: [name], MD;  Location: [place];  Service: Vascular;  Laterality: Left;  . Patch angioplasty  05/11/2012    Procedure: PATCH ANGIOPLASTY;  Surgeon: [name], MD;  Location: [facility] OR;  Service: Vascular;  Laterality: Left;  with Dacron Patch Angioplasty  . Carotid angiogram N/A 05/06/2012    Procedure: CAROTID ANGIOGRAM;  Surgeon: [name], MD;  Location: [facility] CATH LAB;  Service: Cardiovascular;  Laterality: N/A;  . Colonoscopy N/A 07/12/2014    [id number] cecal polyp/one colon polyp/redundant left colon  . Esophagogastroduodenoscopy N/A 07/12/2014    [id number] in the lower thrid of the esophagus  . Savory dilation N/A 07/12/2014    Procedure: SAVORY DILATION;  Surgeon: [name], MD;  [facility];  Service: Endoscopy;  Laterality: N/A;  Venia Minks dilation N/A 07/12/2014    Procedure: Venia Minks DILATION;  Surgeon: [name], MD;  [facility];  Service: Endoscopy;  Laterality: N/A;  . Carotid endarterectomy Left Nov. 4, 2013  CE  . Appendectomy  1956  . Cholecystectomy  1966    Gall Bladder  . Eye surgery Left Dec. 22, 2016    Cataract    Social history: She is widowed. She lives in [place]. She lives alone, but her children visit her daily. She generally ambulates with a walker. She reports that she quit smoking about 23 months ago. She has quit using smokeless tobacco. She reports that she does not drink alcohol or use illicit drugs.  Allergies  Allergen Reactions  . Ibuprofen     Gi upset  . Pnu-Imune [Pneumococcal  Polysaccharide Vaccine]     Huge knot at site and rash  . Advair Diskus [Fluticasone-Salmeterol]     Confusion and shortness of breath  . Albuterol     Confusion and shortness of breath  . Amoxicillin Hives  . Cephalexin Hives  . Codeine     confusion  . Entex Pac [Pseudoephedrine-Gg & Dm] Other (See Comments)    Upper respiratory.   . Etanercept     vertigo  . Gualenic Acid Other (See Comments)    unknown  . Lorazepam     hallucinations  . Nubain [Nalbuphine Hcl]   . Oxycodone Hcl Nausea And Vomiting  . Oxycodone-Acetaminophen Nausea And Vomiting  . Roxicodone [Oxycodone Hcl]   . Tylox [Oxycodone-Acetaminophen]     Family History  Problem Relation Age of Onset  . Deep vein thrombosis Sister     Blood Clot in leg  . Alzheimer's disease Brother   . COPD Sister   . COPD Sister      Prior to Admission medications   Medication Sig Start Date End Date Taking? Authorizing Provider  cilostazol (PLETAL) 100 MG tablet Take 1 tablet (100 mg total) by mouth daily. Patient taking differently: Take 50 mg by mouth 2 (two) times daily.  10/07/14  Yes Minus Breeding, MD  dabigatran (PRADAXA) 150 MG CAPS capsule Take 150 mg by mouth 2 (two) times daily.   Yes Historical Provider, MD  hydrochlorothiazide (MICROZIDE) 12.5 MG capsule Take 12.5 mg by mouth daily.   Yes Historical Provider, MD  potassium chloride SA (K-DUR,KLOR-CON) 20 MEQ tablet Take 20 mEq by mouth daily.   Yes Historical Provider, MD  tiotropium (SPIRIVA) 18 MCG inhalation capsule Place 18 mcg into inhaler and inhale at bedtime.   Yes Historical Provider, MD  verapamil (CALAN-SR) 240 MG CR tablet Take 240 mg by mouth daily. Takes at lunch time   Yes Historical Provider, MD  levalbuterol Penne Lash) 0.63 MG/3ML nebulizer solution Take 3 mLs (0.63 mg total) by nebulization every 4 (four) hours. Patient taking differently: Take 0.63 mg by nebulization every 4 (four) hours as needed for wheezing or shortness of breath.  04/18/14    Erline Hau, MD  pantoprazole (PROTONIX) 40 MG tablet 1 po bid for 3 mos then once daily forever Patient not taking: Reported on 08/03/2015 07/12/14   [name], MD    Physical Exam: Filed Vitals:   01/24/16 1030 01/24/16 1100 01/24/16 1212 01/24/16 1346  BP: 126/72 137/90 131/89 134/81  Pulse: 107 103 95 97  Temp:   98.2 F (36.8 C)   TempSrc:   Oral   Resp:      Height:   5\' 4"  (1.626 m)   Weight:      SpO2: 97% 92% 95%       Constitutional: Small framed 81 year old Caucasian woman in no acute distress. Filed Vitals:   01/24/16 1030 01/24/16 1100  01/24/16 1212 01/24/16 1346  BP: 126/72 137/90 131/89 134/81  Pulse: 107 103 95 97  Temp:   98.2 F (36.8 C)   TempSrc:   Oral   Resp:      Height:   5\' 4"  (1.626 m)   Weight:      SpO2: 97% 92% 95%    Eyes: PERRL, lids and conjunctivae normal. ENMT: Mucous membranes are mildly dry. Posterior pharynx clear of any exudate or lesions.Normal dentition.  Neck: normal, supple, no masses, no thyromegaly Respiratory: Few scattered wheezes Normal respiratory effort. No accessory muscle use.  Cardiovascular: Regular rate and rhythm, no murmurs / rubs / gallops. No extremity edema. 2+ pedal pulses. No carotid bruits.  Abdomen: no tenderness, no masses palpated. No hepatosplenomegaly. Bowel sounds positive.  Musculoskeletal: Mild to moderate tenderness over the lower thoracic/lumbar/sacral muscles. Good muscle tone. Skin: no rashes, lesions, ulcers. No induration Neurologic: CN 2-12 grossly intact. Sensation intact, DTR normal. Strength 5/5 throughout except the lower extremities which are decreased in strength due to pain. Psychiatric: Normal judgment and insight. Alert and oriented x 3. Normal mood.     Labs on Admission: I have personally reviewed following labs and imaging studies  CBC:  Recent Labs Lab 01/24/16 0640  WBC 7.7  NEUTROABS 5.4  HGB 11.7*  HCT 35.6*  MCV 81.5  PLT 123456   Basic Metabolic  Panel:  Recent Labs Lab 01/24/16 0640  NA 136  K 3.9  CL 97*  CO2 32  GLUCOSE 94  BUN 13  CREATININE 0.60  CALCIUM 8.8*   GFR: Estimated Creatinine Clearance: 38.8 mL/min (by C-G formula based on Cr of 0.6). Liver Function Tests: No results for input(s): AST, ALT, ALKPHOS, BILITOT, PROT, ALBUMIN in the last 168 hours. No results for input(s): LIPASE, AMYLASE in the last 168 hours. No results for input(s): AMMONIA in the last 168 hours. Coagulation Profile: No results for input(s): INR, PROTIME in the last 168 hours. Cardiac Enzymes: No results for input(s): CKTOTAL, CKMB, CKMBINDEX, TROPONINI in the last 168 hours. BNP (last 3 results) No results for input(s): PROBNP in the last 8760 hours. HbA1C: No results for input(s): HGBA1C in the last 72 hours. CBG: No results for input(s): GLUCAP in the last 168 hours. Lipid Profile: No results for input(s): CHOL, HDL, LDLCALC, TRIG, CHOLHDL, LDLDIRECT in the last 72 hours. Thyroid Function Tests: No results for input(s): TSH, T4TOTAL, FREET4, T3FREE, THYROIDAB in the last 72 hours. Anemia Panel: No results for input(s): VITAMINB12, FOLATE, FERRITIN, TIBC, IRON, RETICCTPCT in the last 72 hours. Urine analysis:    Component Value Date/Time   COLORURINE YELLOW 06/30/2014 1244   APPEARANCEUR TURBID* 06/30/2014 1244   LABSPEC 1.015 06/30/2014 1244   PHURINE 7.5 06/30/2014 1244   GLUCOSEU NEGATIVE 06/30/2014 1244   HGBUR LARGE* 06/30/2014 [place] NEGATIVE 06/30/2014 [place] 06/30/2014 1244   PROTEINUR NEGATIVE 06/30/2014 1244   UROBILINOGEN 2.0* 06/30/2014 1244   NITRITE NEGATIVE 06/30/2014 1244   LEUKOCYTESUR NEGATIVE 06/30/2014 1244   Sepsis Labs: !!!!!!!!!!!!!!!!!!!!!!!!!!!!!!!!!!!!!!!!!!!! @LABRCNTIP (procalcitonin:4,lacticidven:4) )No results found for this or any previous visit (from the past 240 hour(s)).   Radiological Exams on Admission: Dg Lumbar Spine Complete  01/24/2016  CLINICAL  DATA:  Status post trip and fall this morning with onset of low back pain. Initial encounter. EXAM: LUMBAR SPINE - COMPLETE 4+ VIEW COMPARISON:  CT abdomen and pelvis 06/30/2014. FINDINGS: There is no fracture. Trace anterolisthesis L5 on S1 due to facet arthropathy is unchanged.  Intervertebral disc space height is maintained. The patient has an abdominal aortic aneurysm measuring 4.4 cm AP, increased from approximately 3.1 cm on the prior examination. Large colonic stool burden is noted. IMPRESSION: No acute abnormality. 4.4 cm abdominal aortic aneurysm is increased from 3.1 cm on the prior exam. Recommend follow-up ultrasound in 1 year. This recommendation follows ACR consensus guidelines. Large colonic stool burden. Electronically Signed   By: Inge Rise M.D.   On: 01/24/2016 07:12   Dg Pelvis 1-2 Views  01/24/2016  CLINICAL DATA:  Status post trip and fall this morning with onset of pelvic pain. Initial encounter. EXAM: PELVIS - 1-2 VIEW COMPARISON:  CT abdomen and pelvis 06/30/2014. FINDINGS: There is no evidence of pelvic fracture or diastasis. No pelvic bone lesions are seen. Bones are osteopenic. Large colonic stool burden is noted. IMPRESSION: No acute abnormality. Osteopenia. Large colonic stool burden. Electronically Signed   By: Inge Rise M.D.   On: 01/24/2016 07:13   Ct Lumbar Spine Wo Contrast  01/24/2016  CLINICAL DATA:  Lumbago.  Fall earlier today EXAM: CT LUMBAR SPINE WITHOUT CONTRAST TECHNIQUE: Multidetector CT imaging of the lumbar spine was performed without intravenous contrast administration. Multiplanar CT image reconstructions were also generated. COMPARISON:  CT abdomen and pelvis with bony reformats June 30, 2014; lumbar radiographs January 24, 2016 FINDINGS: Bones are osteoporotic. There is no evidence of acute fracture or spondylolisthesis. There is slight disc space narrowing at T12-L1. Other disc spaces appear unremarkable. At T12-L1, there is mild generalized disc  bulging. There is mild facet hypertrophy bilaterally. There is no nerve root edema or effacement. No disc extrusion or stenosis. At L1-2, there is moderate facet hypertrophy bilaterally. There is mild diffuse disc bulging. There is no nerve root edema or effacement. No disc extrusion or stenosis. At L2-3, there is moderate facet hypertrophy bilaterally and mild diffuse disc bulging. No nerve root edema or effacement. No disc extrusion or stenosis. At L3-4, there is moderate facet hypertrophy bilaterally and mild diffuse disc bulging. No nerve root edema or effacement. No disc extrusion or stenosis. At L4-5, there is a broad-based disc bulging and moderate facet hypertrophy bilaterally. There is mild ligamentum flava hypertrophy bilaterally causing borderline lateral canal stenosis and L4-5 bilaterally. No central canal stenosis noted. No disc extrusion. No nerve root edema or effacement evident. At L5-S1, there is moderate facet hypertrophy bilaterally. There is broad-based disc protrusion without appreciable nerve root edema or effacement. Node disc extrusion or stenosis evident. There is stable calcification between the L2 and L3 spinous processes, possibly residua of old trauma. There is slight lumbar levoscoliosis. There is degenerative change in the sacroiliac joints bilaterally, slightly more severe on the right than on the left. There is extensive calcification in the distal aorta and common iliac arteries. There is aneurysmal dilatation of the distal abdominal aorta with a maximum transverse diameter of 4.3 x 4.0 cm. This aneurysm extends to just above the bifurcation. It arises inferior to the renal arteries. There is no periaortic fluid. IMPRESSION: No fracture or spondylolisthesis. Multilevel arthropathy. Borderline lateral canal stenosis at L4-5, multifactorial. No central stenosis. No disc extrusion or nerve root edema/effacement. Abdominal aortic aneurysm with maximum transverse diameter of 4.3 x 4.0  cm. Recommend followup by ultrasound in 1 year. This recommendation follows ACR consensus guidelines: White Paper of the ACR Incidental Findings Committee II on Vascular Findings. J Am Coll Radiol 2013; 10:789-794. As this aneurysm has increased in size compared to prior study from December 2015, vascular  surgery consultation may also be advisable at this time. Bones osteoporotic. Electronically Signed   By: Lowella Grip III M.D.   On: 01/24/2016 [id number]    EKG: Not applicable  Assessment/Plan Principal Problem:   Fall at home Active Problems:   Protein-calorie malnutrition, severe (HCC)   Inability to ambulate due to multiple joints   Facet arthropathy of spine   Chronic respiratory failure with hypoxia (HCC)   COPD with emphysema (HCC)   Low back pain at multiple sites   Chronic atrial fibrillation (HCC)   AAA (abdominal aortic aneurysm) without rupture (HCC)   PVD (peripheral vascular disease) ([place])   1. Fall at home. The patient denies loss of consciousness, but apparently tripped over her oxygen concentrator. 2. Mid to low back pain and pain over her buttocks, secondary to fall. Radiographic studies revealed no acute fractures. 3. Chronic atrial fibrillation. She is treated chronically with verapamil for rate control and Pradaxa for anticoagulation. 4. 4.3 x 4.0 abdominal aortic aneurysm. This appears to be an incidental finding in a patient with severe peripheral vascular disease. 5. Oxygen dependent COPD with emphysema/chronic respiratory failure with hypoxia. She does have some wheezes on exam, but she says that this is chronic. 6. Hypertension. Currently stable. She is treated with HCTZ and verapamil chronically.   Plan: 1. Due to her multiple allergies, will treat her pain with as needed IV fentanyl; twice a day dosing of tramadol; and twice a day dosing of Tylenol. Will add twice a day dosing of prednisone. 2. For treatment of constipation, we'll start Senokot daily at  bedtime. We'll give Dulcolax suppository. If these measures are not successful, will try an enema. 3. Continue Spiriva and add scheduled Xopenex nebulizer. 4. Continue most of not all of her other chronic medications. 5. Consult PT and OT.    DVT prophylaxis: Pradaxa Code Status: Full code Family Communication: Discussed with daughters Disposition Plan: To be determined, anticipate discharge to home with home health PT versus short-term SNF Consults called: None Admission status: Phebe Colla MD [facility] Pager [phone]  If 7PM-7AM, please contact night-coverage [email or web address] Password [email or web address]  01/24/2016, 1:50 PM

## 2016-01-24 NOTE — Progress Notes (Signed)
Patient has not voided in over eight hours.  Bladder scan read 630 cc.  In and out cath per MD order.  16 french cath used to drain 775 cc of dark amber urine.  UA sent per MD order.  Patient tolerated well and is currently resting in her room.  Will continue to monitor.

## 2016-01-25 ENCOUNTER — Observation Stay (HOSPITAL_COMMUNITY): Payer: Medicare Other

## 2016-01-25 DIAGNOSIS — Z8673 Personal history of transient ischemic attack (TIA), and cerebral infarction without residual deficits: Secondary | ICD-10-CM | POA: Diagnosis not present

## 2016-01-25 DIAGNOSIS — Z9981 Dependence on supplemental oxygen: Secondary | ICD-10-CM | POA: Diagnosis not present

## 2016-01-25 DIAGNOSIS — Y92009 Unspecified place in unspecified non-institutional (private) residence as the place of occurrence of the external cause: Secondary | ICD-10-CM | POA: Diagnosis not present

## 2016-01-25 DIAGNOSIS — K649 Unspecified hemorrhoids: Secondary | ICD-10-CM | POA: Diagnosis present

## 2016-01-25 DIAGNOSIS — J438 Other emphysema: Secondary | ICD-10-CM | POA: Diagnosis not present

## 2016-01-25 DIAGNOSIS — M4686 Other specified inflammatory spondylopathies, lumbar region: Secondary | ICD-10-CM | POA: Diagnosis present

## 2016-01-25 DIAGNOSIS — Z681 Body mass index (BMI) 19 or less, adult: Secondary | ICD-10-CM | POA: Diagnosis not present

## 2016-01-25 DIAGNOSIS — M47819 Spondylosis without myelopathy or radiculopathy, site unspecified: Secondary | ICD-10-CM

## 2016-01-25 DIAGNOSIS — E43 Unspecified severe protein-calorie malnutrition: Secondary | ICD-10-CM | POA: Diagnosis present

## 2016-01-25 DIAGNOSIS — I714 Abdominal aortic aneurysm, without rupture: Secondary | ICD-10-CM | POA: Diagnosis not present

## 2016-01-25 DIAGNOSIS — R339 Retention of urine, unspecified: Secondary | ICD-10-CM | POA: Diagnosis present

## 2016-01-25 DIAGNOSIS — M545 Low back pain: Secondary | ICD-10-CM | POA: Diagnosis present

## 2016-01-25 DIAGNOSIS — E871 Hypo-osmolality and hyponatremia: Secondary | ICD-10-CM | POA: Diagnosis present

## 2016-01-25 DIAGNOSIS — W19XXXA Unspecified fall, initial encounter: Secondary | ICD-10-CM | POA: Diagnosis not present

## 2016-01-25 DIAGNOSIS — K219 Gastro-esophageal reflux disease without esophagitis: Secondary | ICD-10-CM | POA: Diagnosis present

## 2016-01-25 DIAGNOSIS — K921 Melena: Secondary | ICD-10-CM

## 2016-01-25 DIAGNOSIS — K5641 Fecal impaction: Secondary | ICD-10-CM | POA: Diagnosis present

## 2016-01-25 DIAGNOSIS — E875 Hyperkalemia: Secondary | ICD-10-CM | POA: Diagnosis present

## 2016-01-25 DIAGNOSIS — I2510 Atherosclerotic heart disease of native coronary artery without angina pectoris: Secondary | ICD-10-CM | POA: Diagnosis present

## 2016-01-25 DIAGNOSIS — I10 Essential (primary) hypertension: Secondary | ICD-10-CM | POA: Diagnosis present

## 2016-01-25 DIAGNOSIS — J439 Emphysema, unspecified: Secondary | ICD-10-CM

## 2016-01-25 DIAGNOSIS — I4892 Unspecified atrial flutter: Secondary | ICD-10-CM | POA: Diagnosis present

## 2016-01-25 DIAGNOSIS — I959 Hypotension, unspecified: Secondary | ICD-10-CM | POA: Diagnosis present

## 2016-01-25 DIAGNOSIS — K5900 Constipation, unspecified: Secondary | ICD-10-CM

## 2016-01-25 DIAGNOSIS — J9611 Chronic respiratory failure with hypoxia: Secondary | ICD-10-CM | POA: Diagnosis not present

## 2016-01-25 DIAGNOSIS — Z86718 Personal history of other venous thrombosis and embolism: Secondary | ICD-10-CM | POA: Diagnosis not present

## 2016-01-25 DIAGNOSIS — I482 Chronic atrial fibrillation: Secondary | ICD-10-CM | POA: Diagnosis not present

## 2016-01-25 DIAGNOSIS — Z87891 Personal history of nicotine dependence: Secondary | ICD-10-CM | POA: Diagnosis not present

## 2016-01-25 DIAGNOSIS — W010XXA Fall on same level from slipping, tripping and stumbling without subsequent striking against object, initial encounter: Secondary | ICD-10-CM | POA: Diagnosis present

## 2016-01-25 DIAGNOSIS — E440 Moderate protein-calorie malnutrition: Secondary | ICD-10-CM | POA: Diagnosis present

## 2016-01-25 DIAGNOSIS — I739 Peripheral vascular disease, unspecified: Secondary | ICD-10-CM | POA: Diagnosis present

## 2016-01-25 DIAGNOSIS — J449 Chronic obstructive pulmonary disease, unspecified: Secondary | ICD-10-CM | POA: Diagnosis present

## 2016-01-25 LAB — BASIC METABOLIC PANEL
ANION GAP: 7 (ref 5–15)
Anion gap: 6 (ref 5–15)
Anion gap: 7 (ref 5–15)
BUN: 19 mg/dL (ref 6–20)
BUN: 21 mg/dL — AB (ref 6–20)
BUN: 26 mg/dL — AB (ref 6–20)
CALCIUM: 8.9 mg/dL (ref 8.9–10.3)
CALCIUM: 8.7 mg/dL — AB (ref 8.9–10.3)
CHLORIDE: 98 mmol/L — AB (ref 101–111)
CO2: 30 mmol/L (ref 22–32)
CO2: 28 mmol/L (ref 22–32)
CO2: 26 mmol/L (ref 22–32)
CREATININE: 0.65 mg/dL (ref 0.44–1.00)
CREATININE: 0.63 mg/dL (ref 0.44–1.00)
Calcium: 8.6 mg/dL — ABNORMAL LOW (ref 8.9–10.3)
Chloride: 100 mmol/L — ABNORMAL LOW (ref 101–111)
Chloride: 101 mmol/L (ref 101–111)
Creatinine, Ser: 0.52 mg/dL (ref 0.44–1.00)
GFR calc Af Amer: >60 mL/min (ref >60)
GFR calc Af Amer: >60 mL/min (ref >60)
GFR calc non Af Amer: >60 mL/min (ref >60)
GLUCOSE: 125 mg/dL — AB (ref 65–99)
GLUCOSE: 137 mg/dL — AB (ref 65–99)
Glucose, Bld: 240 mg/dL — ABNORMAL HIGH (ref 65–99)
POTASSIUM: 5.5 mmol/L — AB (ref 3.5–5.1)
Potassium: 6.1 mmol/L — ABNORMAL HIGH (ref 3.5–5.1)
Potassium: 4.8 mmol/L (ref 3.5–5.1)
SODIUM: 137 mmol/L (ref 135–145)
Sodium: 135 mmol/L (ref 135–145)
Sodium: 131 mmol/L — ABNORMAL LOW (ref 135–145)

## 2016-01-25 LAB — CBC
HCT: 35 % — ABNORMAL LOW (ref 36.0–46.0)
Hemoglobin: 11.1 g/dL — ABNORMAL LOW (ref 12.0–15.0)
MCH: 26.2 pg (ref 26.0–34.0)
MCHC: 31.7 g/dL (ref 30.0–36.0)
MCV: 82.7 fL (ref 78.0–100.0)
PLATELETS: 220 10*3/uL (ref 150–400)
RBC: 4.23 MIL/uL (ref 3.87–5.11)
RDW: 14.8 % (ref 11.5–15.5)
WBC: 10.1 10*3/uL (ref 4.0–10.5)

## 2016-01-25 LAB — TSH: TSH: 2.775 u[IU]/mL (ref 0.350–4.500)

## 2016-01-25 LAB — MAGNESIUM: Magnesium: 1.8 mg/dL (ref 1.7–2.4)

## 2016-01-25 MED ORDER — FENTANYL CITRATE (PF) 100 MCG/2ML IJ SOLN
25.0000 ug | INTRAMUSCULAR | Status: DC | PRN
  Administered 2016-01-25: 25 ug via INTRAVENOUS
  Filled 2016-01-25: qty 2

## 2016-01-25 MED ORDER — SODIUM CHLORIDE 0.9 % IV BOLUS (SEPSIS)
250.0000 mL | Freq: Once | INTRAVENOUS | Status: AC
  Administered 2016-01-25: 250 mL via INTRAVENOUS

## 2016-01-25 MED ORDER — PREDNISONE 10 MG PO TABS
10.0000 mg | ORAL_TABLET | Freq: Two times a day (BID) | ORAL | Status: DC
  Administered 2016-01-26 – 2016-01-27 (×3): 10 mg via ORAL
  Filled 2016-01-25 (×3): qty 1

## 2016-01-25 MED ORDER — HYDROCORTISONE ACETATE 25 MG RE SUPP
25.0000 mg | Freq: Two times a day (BID) | RECTAL | Status: DC
  Administered 2016-01-25 – 2016-01-27 (×4): 25 mg via RECTAL
  Filled 2016-01-25 (×4): qty 1

## 2016-01-25 MED ORDER — BOOST / RESOURCE BREEZE PO LIQD
1.0000 | ORAL | Status: DC
  Administered 2016-01-25 – 2016-01-26 (×2): 1 via ORAL

## 2016-01-25 MED ORDER — SODIUM CHLORIDE 0.45 % IV SOLN
INTRAVENOUS | Status: DC
  Administered 2016-01-25: 12:00:00 via INTRAVENOUS
  Filled 2016-01-25 (×6): qty 1000

## 2016-01-25 MED ORDER — ADULT MULTIVITAMIN W/MINERALS CH
1.0000 | ORAL_TABLET | Freq: Every day | ORAL | Status: DC
  Administered 2016-01-25 – 2016-01-27 (×3): 1 via ORAL
  Filled 2016-01-25 (×3): qty 1

## 2016-01-25 NOTE — Progress Notes (Signed)
Patient refused to be moved to stretcher for MRI, unable to tolerate.  Pain medication not available, PRN medication given too recently.  MRI cancelled and MD notified to order again tomorrow to re-attempt

## 2016-01-25 NOTE — Evaluation (Signed)
Physical Therapy Evaluation Patient Details Name: Caroline [name] MRN: [id number] DOB: 1935-03-14 Today's Date: 01/25/2016   History of Present Illness  81 yo F admitted 01/24/2016 due to lower back pain following a fall at home. Pt states she tripped on her O2 concentrator and fell on her buttocks without being able to brace her fall. CT of her lumbar spine revealed wide spread arthropathy and abdominal aortic aneurysm. Her pelvic XR revealed no acute abnormality, but with large colonic stool burden. PMH: COPD- O2 dependent on 2-3 L of oxygen, TIA, HTN, CAD, DVT, PVD, chronic Afib- on pradaxa, CVA, ICA stenosis s/p L CEA, gallbladder surgery, patch angioplasty, appendectomy, cholecystectomy.   Clinical Impression  Pt received in bed, and was agreeable to PT evaluation, dtr present.  Pt states that she is normally Mod (I) with all functional mobility and uses a cane at home.  Her other dtr checks in on her daily.  Today during PT evaluation she was not able to even sit on the EOB despite education on proper technique and Mod A given - due to the intense pain.  Attempted 3 trials of sidelying to sit, and still not able to complete.  Dtr expressed that she has had a change in pain medication due to drop in BP.  Will need to continue to evaluate, however she will need HHPT, and recommend 24/7 supervision/assistance.  Dtr stated that they will do their best, but they likely will not be able to provide that for her.     Follow Up Recommendations Home health PT;Supervision/Assistance - 24 hour    Equipment Recommendations       Recommendations for Other Services       Precautions / Restrictions Precautions Precautions: Fall Precaution Comments: Reason for admission Restrictions Weight Bearing Restrictions: No      Mobility  Bed Mobility Overal bed mobility: Needs Assistance Bed Mobility: Rolling;Sidelying to Sit Rolling: Supervision Sidelying to sit: Min assist;Mod assist        General bed mobility comments: pt attempted to sit on the EOB x3 trials, and was not able to due to intense pain.  Educated pt on log roll technique to protect her back, however during the transfer, she consistently rotated trunk, putting her UE behind her despite cues & education not to.    Transfers                    Ambulation/Gait                Stairs            Wheelchair Mobility    Modified Rankin (Stroke Patients Only)       Balance                                             Pertinent Vitals/Pain Pain Assessment: No/denies pain    Home Living   Living Arrangements: Alone (husband passed away in 11-30-22) Available Help at Discharge:  (Dtr that comes every afternoon to get mail.  She helps pay the bills) Type of Home: House Home Access: Stairs to enter Entrance Stairs-Rails: Can reach both Entrance Stairs-Number of Steps: 5-6 in the front and in the back.  Pt normally goes in the front.  Home Layout: One level Home Equipment: Grab bars - tub/shower;Grab bars - toilet;Shower seat;Cane - single point;Wheelchair - [employer] - 2  wheels;Other (comment) (O2 at home 2 L) Additional Comments: majorityof the equipment was for her husband who has passed.     Prior Function     Gait / Transfers Assistance Needed: pt uses a RW for ambulation all the time.    ADL's / Homemaking Assistance Needed: pt sponge bathes - independent with dressing and bathing        Hand Dominance   Dominant Hand: Right    Extremity/Trunk Assessment   Upper Extremity Assessment: Generalized weakness           Lower Extremity Assessment: Generalized weakness         Communication   Communication: No difficulties  Cognition Arousal/Alertness: Awake/alert Behavior During Therapy: WFL for tasks assessed/performed Overall Cognitive Status: Within Functional Limits for tasks assessed                      General Comments       Exercises        Assessment/Plan    PT Assessment Patient needs continued PT services  PT Diagnosis Difficulty walking;Generalized weakness;Abnormality of gait;Acute pain   PT Problem List Decreased strength;Decreased range of motion;Decreased activity tolerance;Decreased balance;Decreased mobility;Decreased knowledge of use of DME;Decreased safety awareness;Decreased knowledge of precautions;Cardiopulmonary status limiting activity;Pain  PT Treatment Interventions DME instruction;Gait training;Stair training;Functional mobility training;Therapeutic activities;Therapeutic exercise;Balance training;Patient/family education   PT Goals (Current goals can be found in the Care Plan section) Acute Rehab PT Goals Patient Stated Goal: Pt wants to have less pain PT Goal Formulation: With patient/family Time For Goal Achievement: 02/01/16 Potential to Achieve Goals: Fair    Frequency Min 5X/week   Barriers to discharge Decreased caregiver support;Inaccessible home environment stairs to enter, and she lives alone - husband recently passed away in 28-Nov-2022.     Co-evaluation               End of Session Equipment Utilized During Treatment: Oxygen Activity Tolerance: Patient limited by pain Patient left: in bed;with bed alarm set;with family/visitor present      Functional Assessment Tool Used: [place] "6-clicks"  Functional Limitation: Mobility: Walking and moving around Mobility: Walking and Moving Around Current Status [phone]): At least 80 percent but less than 100 percent impaired, limited or restricted Mobility: Walking and Moving Around Goal Status [phone]): At least 60 percent but less than 80 percent impaired, limited or restricted    Time: 1134-1201 PT Time Calculation (min) (ACUTE ONLY): 27 min   Charges:   PT Evaluation $PT Eval Moderate Complexity: 1 Procedure PT Treatments $Therapeutic Activity: 8-22 mins   PT G Codes:   PT G-Codes **NOT FOR INPATIENT  CLASS** Functional Assessment Tool Used: [employer] "6-clicks"  Functional Limitation: Mobility: Walking and moving around Mobility: Walking and Moving Around Current Status [phone]): At least 80 percent but less than 100 percent impaired, limited or restricted Mobility: Walking and Moving Around Goal Status [phone]): At least 60 percent but less than 80 percent impaired, limited or restricted    Beth Mechelle Pates, PT, DPT X: [phone]

## 2016-01-25 NOTE — Progress Notes (Signed)
OT Cancellation Note  Patient Details Name: Caroline [name] MRN: [id number] DOB: [birth year]   Cancelled Treatment:     Reason evaluation not completed: Chart reviewed, attempted to see pt however Nsg staff beginning to work with pt on enema administration. Will attempt to see pt again at a later time.   Guadelupe Sabin, OTR/L  [phone]  01/25/2016, 9:10 AM

## 2016-01-25 NOTE — Progress Notes (Signed)
Patient was incontinent of stool.  Bowel movement large with bright red blood present.  BP obtained and documented.  MD paged to notify.  Patient c/o pain- PRN Fentanyl given

## 2016-01-25 NOTE — Consult Note (Signed)
   [facility] Inpatient Consult   01/25/2016  Caroline [name] 1934/11/20 [id number]  Spoke with patient daughter regarding [facility] program services, reviewed that we had recently spoken with patient and they did not want to sign up. Daughter states the discharge plan is still unknown as patient is considering moving to [facility]. Daughter accepted a [facility] brochure to review and speak with patient's other children and will call if they decide to sign up for [facility] program services.  Of note, [facility] program services would not interfere or replace any services arranged by inpatient case management or social work.  For questions contact:  Royetta Crochet. Laymond Purser, RN, BSN, [place] [phone]) Business Cell  [phone]) Toll Free Office

## 2016-01-25 NOTE — Progress Notes (Signed)
PROGRESS NOTE    Caroline [name]  [id number] DOB: 04-26-1935 DOA: 01/24/2016 PCP: Glo Herring., MD   Brief Narrative:  Patient is a 81 year old woman with a history of O2 dependent COPD, TIA, HTN, CAD, and atrial fibrillation on Pradaxa, who presented to the ED on 01/24/2016 complaining of lower back pain after falling at home; she tripped over her oxygen concentrator. Apparently, she was in so much pain that she was unable to ambulate. In the ED, she was afebrile and hemodynamically stable. CT of her lumbar spine revealed widespread multilevel arthropathy and abdominal aortic aneurysm measuring 4.3 x 4.0 cm. Her pelvis x-ray revealed no acute abnormality, but with a large colonic stool burden. She was admitted for further evaluation and management.   Assessment & Plan:   Principal Problem:   Fall at home Active Problems:   Protein-calorie malnutrition, severe ([place])   Inability to ambulate due to multiple joints   Facet arthropathy of spine   Chronic respiratory failure with hypoxia (HCC)   COPD with emphysema (HCC)   Low back pain at multiple sites   Chronic atrial fibrillation (HCC)   AAA (abdominal aortic aneurysm) without rupture (HCC)   PVD (peripheral vascular disease) (HCC)   Constipation   Malnutrition of moderate degree   Hyperkalemia   Hematochezia   Urinary retention   1. Acute low back pain with inability to ambulate due to pain and significant lumbar arthropathy. The patient has multiple allergies to opiates, but she was able to tolerate IV fentanyl. It was ordered when necessary for worsening pain. In addition, twice a day dosing of prednisone, twice a day dosing of Ultracet, and twice a day dosing of Tylenol were ordered. Fentanyl has caused some asymptomatic hypotension. -Patient continues to have what she calls excruciating pain particularly when she is placed on the bed pan and when she tries to get up to walk. It is difficult to tell if she has weakness due  to her back pain or due to a neurological event. -The fentanyl  decreased due to lower blood pressures. -Patient was encouraged to try to ambulate with physical therapy through the pain. Physical therapy recommended home health PT. -MRI of the lumbar spine was ordered, but the patient refused due to anxiousness.  Hypotension in a patient with history of hypertension. Patient is treated chronically with HCTZ and verapamil. They were temporarily held earlier today due to her blood pressure falling into the 80s. She was given a bolus of IV fluids which improved her blood pressure. -The dose of fentanyl was decreased as it was thought to be the cause of her low blood pressures.  Hyperkalemia. Patient had been restarted on potassium chloride supplement. Gentle IV fluids were started with KCl added. Her serum potassium which was 3.9 on admission surprisingly increased to 5.5. Follow-up serum potassium was 6.1. -All potassium was discontinued. Potassium and IV fluids were discontinued. Gentle bicarbonate drip was started. -Her serum potassium has improved.  Obstipation/fecal impaction. Patient was started on Senokot. Dulcolax suppository was given, but was unsuccessful. L4, soapsuds enema was given with some success as the patient had a large bowel movement. Nursing reported some bright red blood in her stool, which was likely from hemorrhoids and the stool burden in the setting of anticoagulation. -We'll start Anusol HC and continue to monitor. We'll hold on GI consult for now.  Urinary retention. Apparently, the patient had not voided following admission. In an out catheter was ordered which drained 775 cc of dark urine. The  urinalysis was unremarkable. -Following the evacuation of stool, she has been able to void spontaneously.  Chronic respiratory failure with hypoxia secondary to COPD. The patient was restarted on Spiriva. Scheduled Xopenex nebulizer was ordered to help with her chronic mild  wheezing. -There has been a decrease in her bronchospasms.  Chronic atrial fibrillation. Patient is treated chronically with verapamil for rate control and Pradaxa for anticoagulation. Both were continued. -Her heart rate has been controlled.  Incidental finding of abdominal aortic aneurysm. We'll continue to monitor.    DVT prophylaxis: Pradaxa Code Status: Full code Family Communication: Discussed with daughter Disposition Plan: Discharge to home with PT; likely in the next 24-48 hours.   Consultants:   None  Procedures:   None  Antimicrobials:  None   Subjective: Patient has less pain at rest, but when she is moved to be placed on the bed pan, it causes increased pain in her lower back that radiates to her legs. She denies numbness in her legs. She does not want to get out of bed per nursing. Her daughter says that the pain medicine works but it doesn't last. She had urinary retention which apparently has resolved. Nursing reports bloody stool following soapsuds enema.  Objective: Filed Vitals:   01/25/16 0959 01/25/16 1145 01/25/16 1401 01/25/16 1556  BP: 88/41 112/49 114/51 128/64  Pulse:  91 100 96  Temp:   98.1 F (36.7 C)   TempSrc:      Resp:   20   Height:      Weight:      SpO2:  90% 95%     Intake/Output Summary (Last 24 hours) at 01/25/16 1607 Last data filed at 01/25/16 1300  Gross per 24 hour  Intake 1444.17 ml  Output    785 ml  Net 659.17 ml   Filed Weights   01/24/16 0554  Weight: 43.092 kg (95 lb)    Examination:  General exam: Patient is calm, but screams in pain when she is moved to be placed on the bedside pan.  Respiratory system: Significant decrease in pulmonary wheezes; occasional wheezes. Respiratory effort normal. Cardiovascular system: Irregular, irregular. No pedal edema. Gastrointestinal system: Abdomen has some fullness over the lower abdomen; nontender. No organomegaly or masses felt. Normal bowel sounds  heard. Central nervous system: Alert and oriented. Strength of her upper extremities 5 over 5. Strength of her lower extremities 4 minus over 5 secondary to pain. Sensation over the lower extremities intact to cold touch. Patient able to flex and extend her feet against examiner's hands. Extremities: She is able to lift both legs against gravity approximately 15. Skin: No rashes, lesions or ulcers Psychiatry: Slightly anxious due to pain and not wanting to be moved.     Data Reviewed: I have personally reviewed following labs and imaging studies  CBC:  Recent Labs Lab 01/24/16 0640 01/25/16 0541  WBC 7.7 10.1  NEUTROABS 5.4  --   HGB 11.7* 11.1*  HCT 35.6* 35.0*  MCV 81.5 82.7  PLT 203 [id number]   Basic Metabolic Panel:  Recent Labs Lab 01/24/16 0640 01/25/16 0541 01/25/16 0746  NA 136 137 135  K 3.9 5.5* 6.1*  CL 97* 100* 101  CO2 32 30 28  GLUCOSE 94 125* 137*  BUN 13 19 21*  CREATININE 0.60 0.52 0.65  CALCIUM 8.8* 8.9 8.7*   GFR: Estimated Creatinine Clearance: 38.8 mL/min (by C-G formula based on Cr of 0.65). Liver Function Tests: No results for input(s): AST, ALT, ALKPHOS, BILITOT,  PROT, ALBUMIN in the last 168 hours. No results for input(s): LIPASE, AMYLASE in the last 168 hours. No results for input(s): AMMONIA in the last 168 hours. Coagulation Profile: No results for input(s): INR, PROTIME in the last 168 hours. Cardiac Enzymes: No results for input(s): CKTOTAL, CKMB, CKMBINDEX, TROPONINI in the last 168 hours. BNP (last 3 results) No results for input(s): PROBNP in the last 8760 hours. HbA1C: No results for input(s): HGBA1C in the last 72 hours. CBG: No results for input(s): GLUCAP in the last 168 hours. Lipid Profile: No results for input(s): CHOL, HDL, LDLCALC, TRIG, CHOLHDL, LDLDIRECT in the last 72 hours. Thyroid Function Tests: No results for input(s): TSH, T4TOTAL, FREET4, T3FREE, THYROIDAB in the last 72 hours. Anemia Panel: No results for  input(s): VITAMINB12, FOLATE, FERRITIN, TIBC, IRON, RETICCTPCT in the last 72 hours. Sepsis Labs: No results for input(s): PROCALCITON, LATICACIDVEN in the last 168 hours.  No results found for this or any previous visit (from the past 240 hour(s)).       Radiology Studies: Dg Lumbar Spine Complete  01/24/2016  CLINICAL DATA:  Status post trip and fall this morning with onset of low back pain. Initial encounter. EXAM: LUMBAR SPINE - COMPLETE 4+ VIEW COMPARISON:  CT abdomen and pelvis 06/30/2014. FINDINGS: There is no fracture. Trace anterolisthesis L5 on S1 due to facet arthropathy is unchanged. Intervertebral disc space height is maintained. The patient has an abdominal aortic aneurysm measuring 4.4 cm AP, increased from approximately 3.1 cm on the prior examination. Large colonic stool burden is noted. IMPRESSION: No acute abnormality. 4.4 cm abdominal aortic aneurysm is increased from 3.1 cm on the prior exam. Recommend follow-up ultrasound in 1 year. This recommendation follows ACR consensus guidelines. Large colonic stool burden. Electronically Signed   By: Inge Rise M.D.   On: 01/24/2016 07:12   Dg Pelvis 1-2 Views  01/24/2016  CLINICAL DATA:  Status post trip and fall this morning with onset of pelvic pain. Initial encounter. EXAM: PELVIS - 1-2 VIEW COMPARISON:  CT abdomen and pelvis 06/30/2014. FINDINGS: There is no evidence of pelvic fracture or diastasis. No pelvic bone lesions are seen. Bones are osteopenic. Large colonic stool burden is noted. IMPRESSION: No acute abnormality. Osteopenia. Large colonic stool burden. Electronically Signed   By: Inge Rise M.D.   On: 01/24/2016 07:13   Ct Lumbar Spine Wo Contrast  01/24/2016  CLINICAL DATA:  Lumbago.  Fall earlier today EXAM: CT LUMBAR SPINE WITHOUT CONTRAST TECHNIQUE: Multidetector CT imaging of the lumbar spine was performed without intravenous contrast administration. Multiplanar CT image reconstructions were also  generated. COMPARISON:  CT abdomen and pelvis with bony reformats June 30, 2014; lumbar radiographs January 24, 2016 FINDINGS: Bones are osteoporotic. There is no evidence of acute fracture or spondylolisthesis. There is slight disc space narrowing at T12-L1. Other disc spaces appear unremarkable. At T12-L1, there is mild generalized disc bulging. There is mild facet hypertrophy bilaterally. There is no nerve root edema or effacement. No disc extrusion or stenosis. At L1-2, there is moderate facet hypertrophy bilaterally. There is mild diffuse disc bulging. There is no nerve root edema or effacement. No disc extrusion or stenosis. At L2-3, there is moderate facet hypertrophy bilaterally and mild diffuse disc bulging. No nerve root edema or effacement. No disc extrusion or stenosis. At L3-4, there is moderate facet hypertrophy bilaterally and mild diffuse disc bulging. No nerve root edema or effacement. No disc extrusion or stenosis. At L4-5, there is a broad-based disc bulging  and moderate facet hypertrophy bilaterally. There is mild ligamentum flava hypertrophy bilaterally causing borderline lateral canal stenosis and L4-5 bilaterally. No central canal stenosis noted. No disc extrusion. No nerve root edema or effacement evident. At L5-S1, there is moderate facet hypertrophy bilaterally. There is broad-based disc protrusion without appreciable nerve root edema or effacement. Node disc extrusion or stenosis evident. There is stable calcification between the L2 and L3 spinous processes, possibly residua of old trauma. There is slight lumbar levoscoliosis. There is degenerative change in the sacroiliac joints bilaterally, slightly more severe on the right than on the left. There is extensive calcification in the distal aorta and common iliac arteries. There is aneurysmal dilatation of the distal abdominal aorta with a maximum transverse diameter of 4.3 x 4.0 cm. This aneurysm extends to just above the bifurcation. It  arises inferior to the renal arteries. There is no periaortic fluid. IMPRESSION: No fracture or spondylolisthesis. Multilevel arthropathy. Borderline lateral canal stenosis at L4-5, multifactorial. No central stenosis. No disc extrusion or nerve root edema/effacement. Abdominal aortic aneurysm with maximum transverse diameter of 4.3 x 4.0 cm. Recommend followup by ultrasound in 1 year. This recommendation follows ACR consensus guidelines: White Paper of the ACR Incidental Findings Committee II on Vascular Findings. J Am Coll Radiol 2013; 10:789-794. As this aneurysm has increased in size compared to prior study from December 2015, vascular surgery consultation may also be advisable at this time. Bones osteoporotic. Electronically Signed   By: Lowella Grip III M.D.   On: 01/24/2016 10:14        Scheduled Meds: . acetaminophen  650 mg Oral BID  . cilostazol  50 mg Oral BID  . dabigatran  150 mg Oral BID  . feeding supplement  1 Container Oral Q24H  . hydrochlorothiazide  12.5 mg Oral Daily  . hydrocortisone  25 mg Rectal BID  . levalbuterol  0.63 mg Nebulization QID  . multivitamin with minerals  1 tablet Oral Daily  . pantoprazole  40 mg Oral Daily  . predniSONE  15 mg Oral BID WC  . senna-docusate  2 tablet Oral QHS  . tiotropium  18 mcg Inhalation QHS  . traMADol-acetaminophen  1 tablet Oral BID  . verapamil  240 mg Oral Daily   Continuous Infusions: . sodium chloride 0.45 % 1,000 mL with sodium bicarbonate 50 mEq infusion 65 mL/hr at 01/25/16 1222        Time spent: 35-40 minutes    Rexene Alberts, MD [facility] Pager [phone]  If 7PM-7AM, please contact night-coverage [email or web address] Password [email or web address] 01/25/2016, 4:07 PM

## 2016-01-25 NOTE — Progress Notes (Addendum)
Initial Nutrition Assessment  DOCUMENTATION CODES:  Underweight   Pt meets criteria for AT LEAST  MODERATE MALNUTRITION in the context of CHRONIC ILLNESS as evidenced by Moderate muscle/fat wasting.   INTERVENTION:  Boost Breeze po q24 hrs , each supplement provides 250 kcal and 9 grams of protein  Magic cup BID with meals, each supplement provides 290 kcal and 9 grams of protein  MVI with minerals  Education on foods for wt gain   NUTRITION DIAGNOSIS:  Increased nutrient needs related to catabolic illness, chronic illness as evidenced by progressive wt loss of ~10 lbs a year for the last 5-7 years.   GOAL:  Patient will meet greater than or equal to 90% of their needs  MONITOR:  PO intake, Supplement acceptance, Labs  REASON FOR ASSESSMENT:  Malnutrition Screening Tool    ASSESSMENT:  81 y/o female PMHx o2 dependant copd, tia, htn, cad, dvt, pvd, afib who presented after falling at home. Work up reveals AAA and constipation/large stool burden. Admitted for eval and management.   RD spoke with pt and daughter arrived later. Pt reports eating 2-3 meals each day, albeit small meals such as a sandwich. She did not take vitamin/mineral supplements. She says she didn't use any salt. She hates Ensure/Boost and it makes her sick.   Patient states the main reason for her wt loss was due to her taking care of her dying husband. Daughter shares later that pt would worry much more about her husband: "if he didn't eat, she wouldn't". This placed a large strain on the pt. Since the husbands passing in the spring of this year, daughter has noted an increase in her appetite.   At this time pt denies any n/v/c/d  RD offered Magic cup as alternative. She says if she was to have a supplement she would prefer this, however, she has never tried the [employer] and this is commercially available. She was agreeable to trying one. Hopefully, she will like it and this can be used at her home for wt  maintenance.   Daughter shares that pt's diet largely consists of vegetables and items that are not good for wt loss. RD shared his "big three" foods for wt gain (foods high in kcals/fat/pro) which are Eggs, PB, Cheese. She says she likes all of these. Gave instruction to add extras to each of her meals such as cheese sauce to her vegetables. She is to never eat a single item by itself. Daughter states she has told her mom the same thing.   Wt history shows a progressive decline the past 7 years. Was 140~lbs in 2010, ~130 lbs in 2012, ~110-120 lbs in 2013, 100-110 in 2015. Has been roughly 95-99 lbs since late 2015. Consistent with gradual muscle/fat loss in setting of catabolic chronic disease states-such as her COPD, though pt/daughter report much of wt loss was due to pt taking care of dying husband. Daughter does st pt's weight "has been stable for a while now"  Physical exam was restricted to upper body. At least mild to mod wasting noted  Additionally, pt has smooth appearing tongue and mild angular cheilitis. Potentially  due to insufficient b vitamin intake in setting of inadequate po intake w/o supplementation.   Meds: Prednisone, ppi, microzide, senokot s  Labs reviewed:Glu: 125-135    Recent Labs Lab 01/24/16 0640 01/25/16 0541 01/25/16 0746  NA 136 137 135  K 3.9 5.5* 6.1*  CL 97* 100* 101  CO2 32 30 28  BUN  13 19 21*  CREATININE 0.60 0.52 0.65  CALCIUM 8.8* 8.9 8.7*  GLUCOSE 94 125* 137*   Diet Order:  Diet Heart Room service appropriate?: Yes; Fluid consistency:: Thin  Skin: Dry, ecchymotic arms  Last BM:  7/18  Height:  Ht Readings from Last 1 Encounters:  01/24/16 5\' 4"  (1.626 m)   Weight:  Wt Readings from Last 1 Encounters:  01/24/16 95 lb (43.092 kg)   Wt Readings from Last 10 Encounters:  01/24/16 95 lb (43.092 kg)  08/03/15 96 lb (43.545 kg)  06/21/15 95 lb (43.092 kg)  11/28/14 98 lb 9.6 oz (44.725 kg)  11/08/14 94 lb (42.638 kg)  07/26/14 99 lb  3.2 oz (44.997 kg)  07/12/14 95 lb (43.092 kg)  06/30/14 95 lb (43.092 kg)  05/04/14 100 lb (45.36 kg)  04/18/14 106 lb 4.8 oz (48.217 kg)   Ideal Body Weight:  54.55 kg  BMI:  Body mass index is 16.3 kg/(m^2).  Estimated Nutritional Needs:  Kcal:  1500-1700 kcls (35-40 kcal/kg bw) Protein:  60-70 g (1.4-1.6 g/kg ibw) Fluid:  >1.5 liters  EDUCATION NEEDS:  Education needs addressed  Burtis Junes RD, LDN, CNSC Clinical Nutrition Pager: [phone] 01/25/2016 9:59 AM

## 2016-01-26 LAB — BASIC METABOLIC PANEL
ANION GAP: 7 (ref 5–15)
BUN: 23 mg/dL — ABNORMAL HIGH (ref 6–20)
CALCIUM: 8.4 mg/dL — AB (ref 8.9–10.3)
CHLORIDE: 94 mmol/L — AB (ref 101–111)
CO2: 31 mmol/L (ref 22–32)
CREATININE: 0.47 mg/dL (ref 0.44–1.00)
GFR calc non Af Amer: >60 mL/min (ref >60)
Glucose, Bld: 97 mg/dL (ref 65–99)
Potassium: 4.4 mmol/L (ref 3.5–5.1)
SODIUM: 132 mmol/L — AB (ref 135–145)

## 2016-01-26 LAB — CBC
HCT: 29.3 % — ABNORMAL LOW (ref 36.0–46.0)
Hemoglobin: 9.8 g/dL — ABNORMAL LOW (ref 12.0–15.0)
MCH: 27.1 pg (ref 26.0–34.0)
MCHC: 33.4 g/dL (ref 30.0–36.0)
MCV: 81.2 fL (ref 78.0–100.0)
Platelets: 217 K/uL (ref 150–400)
RBC: 3.61 MIL/uL — ABNORMAL LOW (ref 3.87–5.11)
RDW: 14.5 % (ref 11.5–15.5)
WBC: 12.8 K/uL — ABNORMAL HIGH (ref 4.0–10.5)

## 2016-01-26 MED ORDER — LEVALBUTEROL HCL 0.63 MG/3ML IN NEBU
0.6300 mg | INHALATION_SOLUTION | Freq: Three times a day (TID) | RESPIRATORY_TRACT | Status: DC
  Administered 2016-01-26 (×2): 0.63 mg via RESPIRATORY_TRACT
  Filled 2016-01-26 (×3): qty 3

## 2016-01-26 NOTE — Progress Notes (Signed)
PROGRESS NOTE    Caroline [name]  [id number] DOB: [birth year] DOA: 01/24/2016 PCP: Glo Herring., MD   Brief Narrative:  Patient is a 81 year old woman with a history of O2 dependent COPD, TIA, HTN, CAD, and atrial fibrillation on Pradaxa, who presented to the ED on 01/24/2016 complaining of lower back pain after falling at home; she tripped over her oxygen concentrator. Apparently, she was in so much pain that she was unable to ambulate. In the ED, she was afebrile and hemodynamically stable. CT of her lumbar spine revealed widespread multilevel arthropathy and abdominal aortic aneurysm measuring 4.3 x 4.0 cm. Her pelvis x-ray revealed no acute abnormality, but with a large colonic stool burden. She was admitted for further evaluation and management.   Assessment & Plan:   Principal Problem:   Fall at home Active Problems:   Protein-calorie malnutrition, severe ([place])   Inability to ambulate due to multiple joints   Facet arthropathy of spine   Chronic respiratory failure with hypoxia (HCC)   COPD with emphysema (HCC)   Low back pain at multiple sites   Chronic atrial fibrillation (HCC)   AAA (abdominal aortic aneurysm) without rupture (HCC)   PVD (peripheral vascular disease) (HCC)   Constipation   Malnutrition of moderate degree   Hyperkalemia   Hematochezia   Urinary retention   1. Acute low back pain with inability to ambulate due to pain and significant lumbar arthropathy. The patient has multiple allergies to opiates, but she was able to tolerate IV fentanyl. It was ordered when necessary for worsening pain. In addition, twice a day dosing of prednisone, twice a day dosing of Ultracet, and twice a day dosing of Tylenol were ordered. Fentanyl has caused some asymptomatic hypotension. -Patient continues to have what she calls excruciating pain particularly when she is placed on the bed pan and when she tries to get up to walk. It is difficult to tell if she has weakness due  to her back pain or due to a neurological event. -The fentanyl was decreased due to lower blood pressures, but will be discontinued due to some mild confusion which is likely from the fentanyl. -Patient was encouraged to try to ambulate with physical therapy through the pain. Physical therapy recommended home health PT. -MRI of the lumbar spine was ordered, but the patient refused due to anxiousness and claustrophobia. At this point, I don't think is needed as the patient does not appear to have any radiculopathy symptoms.  Hypotension in a patient with history of hypertension. Patient is treated chronically with HCTZ and verapamil. They were temporarily held earlier today due to her blood pressure falling into the 80s. She was given a bolus of IV fluids which improved her blood pressure. -The dose of fentanyl was decreased as it was thought to be the cause of her low blood pressures; it will be discontinued due to mild confusion.  Hyperkalemia. Patient had been restarted on potassium chloride supplement. Gentle IV fluids were started with KCl added. Her serum potassium which was 3.9 on admission surprisingly increased to 5.5. Follow-up serum potassium was 6.1. -All potassium was discontinued. Potassium and IV fluids were discontinued. Gentle bicarbonate drip was started. -Her serum potassium has improved.  Obstipation/fecal impaction. Patient was started on Senokot. Dulcolax suppository was given, but was unsuccessful. L4, soapsuds enema was given with some success as the patient had a large bowel movement. Nursing reported some bright red blood in her stool, which was likely from hemorrhoids and the stool burden in  the setting of anticoagulation. -Anusol HC was started.  We'll hold on GI consult for now. There has been no further bleeding.  Urinary retention. Apparently, the patient had not voided following admission. In an out catheter was ordered which drained 775 cc of dark urine. The  urinalysis was unremarkable. -Following the evacuation of stool, she has been able to void spontaneously.  Chronic respiratory failure with hypoxia secondary to COPD. The patient was restarted on Spiriva. Scheduled Xopenex nebulizer was ordered to help with her chronic mild wheezing. -There has been a decrease in her bronchospasms.  Chronic atrial fibrillation. Patient is treated chronically with verapamil for rate control and Pradaxa for anticoagulation. Both were continued. -Her heart rate has been controlled.  Incidental finding of abdominal aortic aneurysm. We'll continue to monitor.    DVT prophylaxis: Pradaxa Code Status: Full code Family Communication: Discussed with daughter Disposition Plan: Discharge to home with PT; likely in the next 24-48 hours.   Consultants:   None  Procedures:   None  Antimicrobials:  None   Subjective: Patient continues to resist being moved secondary to low back pain. Her daughter reports that the patient has some mild confusion which she believes is secondary to the IV pain medicine.  Objective: Filed Vitals:   01/26/16 0416 01/26/16 0819 01/26/16 1300 01/26/16 1417  BP: 123/68  132/61   Pulse: 105  108   Temp: 98 F (36.7 C)  98.1 F (36.7 C)   TempSrc: Oral  Oral   Resp: 20  20   Height:      Weight:      SpO2: 97% 98% 97% 97%    Intake/Output Summary (Last 24 hours) at 01/26/16 1916 Last data filed at 01/26/16 1824  Gross per 24 hour  Intake    720 ml  Output   1300 ml  Net   -580 ml   Filed Weights   01/24/16 0554  Weight: 43.092 kg (95 lb)    Examination:  General exam: Patient is calm, but screams in pain when she is moved to be placed on the bedside pan.  Respiratory system: Significant decrease in pulmonary wheezes; occasional wheezes. Respiratory effort normal. Cardiovascular system: Irregular, irregular. No pedal edema. Gastrointestinal system: Abdomen has some fullness over the lower abdomen;  nontender. No organomegaly or masses felt. Normal bowel sounds heard. Central nervous system: Alert and oriented. Strength of her upper extremities 5 over 5. Strength of her lower extremities 4 minus over 5 secondary to pain. Sensation over the lower extremities intact to cold touch. Patient able to flex and extend her feet against examiner's hands. Extremities: She is able to lift both legs against gravity approximately 15. Skin: No rashes, lesions or ulcers Psychiatry: Slightly anxious due to pain and not wanting to be moved.     Data Reviewed: I have personally reviewed following labs and imaging studies  CBC:  Recent Labs Lab 01/24/16 0640 01/25/16 0541 01/26/16 0535  WBC 7.7 10.1 12.8*  NEUTROABS 5.4  --   --   HGB 11.7* 11.1* 9.8*  HCT 35.6* 35.0* 29.3*  MCV 81.5 82.7 81.2  PLT 203 220 [id number]   Basic Metabolic Panel:  Recent Labs Lab 01/24/16 0640 01/25/16 0541 01/25/16 0746 01/25/16 1711 01/26/16 0535  NA 136 137 135 131* 132*  K 3.9 5.5* 6.1* 4.8 4.4  CL 97* 100* 101 98* 94*  CO2 32 30 28 26 31   GLUCOSE 94 125* 137* 240* 97  BUN 13 19 21* 26* 23*  CREATININE 0.60 0.52 0.65 0.63 0.47  CALCIUM 8.8* 8.9 8.7* 8.6* 8.4*  MG  --   --   --  1.8  --    GFR: Estimated Creatinine Clearance: 38.8 mL/min (by C-G formula based on Cr of 0.47). Liver Function Tests: No results for input(s): AST, ALT, ALKPHOS, BILITOT, PROT, ALBUMIN in the last 168 hours. No results for input(s): LIPASE, AMYLASE in the last 168 hours. No results for input(s): AMMONIA in the last 168 hours. Coagulation Profile: No results for input(s): INR, PROTIME in the last 168 hours. Cardiac Enzymes: No results for input(s): CKTOTAL, CKMB, CKMBINDEX, TROPONINI in the last 168 hours. BNP (last 3 results) No results for input(s): PROBNP in the last 8760 hours. HbA1C: No results for input(s): HGBA1C in the last 72 hours. CBG: No results for input(s): GLUCAP in the last 168 hours. Lipid Profile: No  results for input(s): CHOL, HDL, LDLCALC, TRIG, CHOLHDL, LDLDIRECT in the last 72 hours. Thyroid Function Tests:  Recent Labs  01/25/16 1711  TSH 2.775   Anemia Panel: No results for input(s): VITAMINB12, FOLATE, FERRITIN, TIBC, IRON, RETICCTPCT in the last 72 hours. Sepsis Labs: No results for input(s): PROCALCITON, LATICACIDVEN in the last 168 hours.  No results found for this or any previous visit (from the past 240 hour(s)).       Radiology Studies: No results found.      Scheduled Meds: . acetaminophen  650 mg Oral BID  . cilostazol  50 mg Oral BID  . dabigatran  150 mg Oral BID  . feeding supplement  1 Container Oral Q24H  . hydrochlorothiazide  12.5 mg Oral Daily  . hydrocortisone  25 mg Rectal BID  . levalbuterol  0.63 mg Nebulization TID  . multivitamin with minerals  1 tablet Oral Daily  . pantoprazole  40 mg Oral Daily  . predniSONE  10 mg Oral BID WC  . senna-docusate  2 tablet Oral QHS  . tiotropium  18 mcg Inhalation QHS  . traMADol-acetaminophen  1 tablet Oral BID  . verapamil  240 mg Oral Daily   Continuous Infusions: . sodium chloride 0.45 % 1,000 mL with sodium bicarbonate 50 mEq infusion 50 mL/hr at 01/25/16 2155     LOS: 1 day    Time spent: 35-40 minutes    Rexene Alberts, MD [facility] Pager [phone]  If 7PM-7AM, please contact night-coverage [email or web address] Password [facility] 01/26/2016, 7:16 PM

## 2016-01-26 NOTE — Care Management Note (Signed)
Case Management Note  Patient Details  Name: MARYCATHERINE PICASO MRN: [id number] Date of Birth: 1935-02-03  Subjective/Objective:      Patient from home alone. Has home oxygen provided APS. PT has recommended HHPT and 24 hour supervision.               Action/Plan: Daughter at bedside during assessment. Patient and daughter are agreeable to home health. Will add RN, CSW as well as PT. Daughter lives in  [place] and patient has no one to stay with her during the day. States patient will be fine if she gets home health PT. Offered list of private duty agencies and daughter states her mom may move to [place] to live with her. Offered choice on home heatlh, Patient states she doesn't care as long as its not [facility]. [facility] mentioned. Notified Abby of [facility] who will obtain information from chart. Patient aware [facility] has 48 hours to make first visit.    Expected Discharge Date:                  Expected Discharge Plan:  [place]  In-House Referral:  NA  Discharge planning Services  CM Consult  Post Acute Care Choice:  Home Health Choice offered to:  Patient, Adult Children  DME Arranged:    DME Agency:     HH Arranged:  RN, PT, Social Work [employer] Agency:  [place]  Status of Service:  Completed, signed off  If discussed at [employer], dates discussed:    Additional Comments:  Kona Lover, Chauncey Reading, RN 01/26/2016, 10:37 AM

## 2016-01-26 NOTE — Progress Notes (Signed)
Physical Therapy Treatment Patient Details Name: [facility] MRN: [id number] DOB: 01-03-1935 Today's Date: 01/26/2016    History of Present Illness 81 yo F admitted 01/24/2016 due to lower back pain following a fall at home. Pt states she tripped on her O2 concentrator and fell on her buttocks without being able to brace her fall. CT of her lumbar spine revealed wide spread arthropathy and abdominal aortic aneurysm. Her pelvic XR revealed no acute abnormality, but with large colonic stool burden. PMH: COPD- O2 dependent on 2-3 L of oxygen, TIA, HTN, CAD, DVT, PVD, chronic Afib- on pradaxa, CVA, ICA stenosis s/p L CEA, gallbladder surgery, patch angioplasty, appendectomy, cholecystectomy.     PT Comments    Pt received in bed, and was agreeable to tx, however she states that the nurses got her over to the chair this morning whether she wanted to or not and "left me there forever."  Today during PT tx, she was able to sit on the EOB, however expressed excruciating pain.  Despite encouragement and education on importance of mobility, she adamantly refuses to attempt to stand.  Since she lives alone, and has not been able to mobilize without significant assistance, she is being recommended for SNF.     Follow Up Recommendations  SNF     Equipment Recommendations       Recommendations for Other Services       Precautions / Restrictions Precautions Precautions: Fall Precaution Comments: Reason for admission, log roll to get OOB due to back pain.     Mobility  Bed Mobility Overal bed mobility: Needs Assistance Bed Mobility: Rolling;Sidelying to Sit;Sit to Sidelying Rolling: Supervision (vc's for hand placement. ) Sidelying to sit: Min assist (Encouragement to continue up into sitting once transfer initiated.  )     Sit to sidelying: Min assist General bed mobility comments: Pt was able to sit on the EOB for ~5 min before adamantly insisting that she lay back down in the bed.  Pt  screaming that she is going to fall one way other the other.  Therefore, assisted back into the bed in L sidelying.   Transfers Overall transfer level:  (Extensive education on importance of mobility, however, pt refused to attempt standing due to increased pain.  She is not due for further pain medication at this time. )                  Ambulation/Gait                 Stairs            Wheelchair Mobility    Modified Rankin (Stroke Patients Only)       Balance Overall balance assessment: Needs assistance Sitting-balance support: Bilateral upper extremity supported Sitting balance-Leahy Scale: Fair                              Cognition Arousal/Alertness: Awake/alert Behavior During Therapy: WFL for tasks assessed/performed Overall Cognitive Status: Within Functional Limits for tasks assessed                      Exercises      General Comments        Pertinent Vitals/Pain Pain Assessment: Faces Pain Score: 8  Pain Location: back and sacral area.  Pain Descriptors / Indicators: Sharp Pain Intervention(s): Limited activity within patient's tolerance;Repositioned;Premedicated before session    Home Living  Prior Function            PT Goals (current goals can now be found in the care plan section) Acute Rehab PT Goals Patient Stated Goal: Pt wants to have less pain PT Goal Formulation: With patient/family Time For Goal Achievement: 02/01/16 Potential to Achieve Goals: Fair Progress towards PT goals: Progressing toward goals    Frequency  Min 5X/week    PT Plan Discharge plan needs to be updated    Co-evaluation             End of Session Equipment Utilized During Treatment: Oxygen;Gait belt Activity Tolerance: Patient limited by pain Patient left: in bed;with family/visitor present     Time: [id number] PT Time Calculation (min) (ACUTE ONLY): 23 min  Charges:                        G Codes:      Beth Rahi Chandonnet, PT, DPT [id number]

## 2016-01-26 NOTE — Evaluation (Signed)
Occupational Therapy Evaluation Patient Details Name: Caroline [name] MRN: [id number] DOB: 01-10-1935 Today's Date: 01/26/2016    History of Present Illness 81 yo F admitted 01/24/2016 due to lower back pain following a fall at home. Pt states she tripped on her O2 concentrator and fell on her buttocks without being able to brace her fall. CT of her lumbar spine revealed wide spread arthropathy and abdominal aortic aneurysm. Her pelvic XR revealed no acute abnormality, but with large colonic stool burden. PMH: COPD- O2 dependent on 2-3 L of oxygen, TIA, HTN, CAD, DVT, PVD, chronic Afib- on pradaxa, CVA, ICA stenosis s/p L CEA, gallbladder surgery, patch angioplasty, appendectomy, cholecystectomy.    Clinical Impression   OT saw pt at 9:05am. Pt awake and alert, agrees to attempt to work with OT. Pt reports pain in low back and sacral area with any movement, instructed in log rolling technique to prepare to come to seated position.  Pt able to roll with supervision and verbal cuing for hand placement. Pt attempted to sit 3X on right side of bed, unable due to pain. Pt then attempted to sit 3X on left side of bed, able to sit up fully on third attempt, however only able to maintain position for 3-5 seconds before insisting she lay back down due to the pain. Pt is currently max-total assist for ADL tasks, as well as all mobility and functioning. Pt family is unable to provide 24/7 support for pt, recommend SNF on discharge.      Follow Up Recommendations  SNF    Equipment Recommendations  None recommended by OT       Precautions / Restrictions Precautions Precautions: Fall Precaution Comments: Reason for admission, log roll to get OOB due to back pain.  Restrictions Weight Bearing Restrictions: No      Mobility Bed Mobility Overal bed mobility: Needs Assistance Bed Mobility: Rolling;Sidelying to Sit;Sit to Sidelying Rolling: Supervision (verbal cuing for hand placement) Sidelying to  sit: Mod assist (encouraged to sit all the way up)     Sit to sidelying: Min assist General bed mobility comments: 9:05am: Pt attempted to sit 3X on each side, able to sit up from left side of bed for approximately 3-5 seconds before insisting she lay back down  Transfers Overall transfer level:  (Extensive education on importance of mobility, however, pt refused to attempt standing due to increased pain.  She is not due for further pain medication at this time. )               General transfer comment: not tested    Balance Overall balance assessment: Needs assistance Sitting-balance support: Bilateral upper extremity supported Sitting balance-Leahy Scale: Fair                                      ADL Overall ADL's : Needs assistance/impaired                                       General ADL Comments: Pt currently requires max-total assistance for all ADL tasks due to pain limiting mobility and functioning     Vision Vision Assessment?: No apparent visual deficits          Pertinent Vitals/Pain Pain Assessment: Faces Pain Score: 8  Faces Pain Scale: Hurts whole lot Pain Location: back and  sacral region Pain Descriptors / Indicators: Sharp Pain Intervention(s): Limited activity within patient's tolerance;Monitored during session;Repositioned     Hand Dominance Right   Extremity/Trunk Assessment Upper Extremity Assessment Upper Extremity Assessment: Generalized weakness   Lower Extremity Assessment Lower Extremity Assessment: Generalized weakness       Communication Communication Communication: No difficulties   Cognition Arousal/Alertness: Awake/alert Behavior During Therapy: WFL for tasks assessed/performed Overall Cognitive Status: Within Functional Limits for tasks assessed                                Home Living Family/patient expects to be discharged to:: Unsure Living Arrangements: Alone Available  Help at Discharge: Family;Available PRN/intermittently               Bathroom Shower/Tub: Tub/shower unit   Bathroom Toilet: Handicapped height     Home Equipment: Grab bars - tub/shower;Grab bars - toilet;Shower seat;Cane - single point;Wheelchair - [employer] - 2 wheels;Other (comment)   Additional Comments: majorityof the equipment was for her husband who has passed.       Prior Functioning/Environment Level of Independence: Independent with assistive device(s)  Gait / Transfers Assistance Needed: pt uses a RW for ambulation all the time.   ADL's / Homemaking Assistance Needed: pt sponge bathes - independent with dressing and bathing        OT Diagnosis: Acute pain   OT Problem List: Pain;Decreased strength   OT Treatment/Interventions:      OT Goals(Current goals can be found in the care plan section) Acute Rehab OT Goals Patient Stated Goal: Pt wants to have less pain  OT Frequency:     Barriers to D/C: Decreased caregiver support  One dtr lives in [facility], other checks in daily, however neither able to provide 24/7 supervision          End of Session    Activity Tolerance: Patient limited by pain Patient left: in bed;with call bell/phone within reach   Time: 0905-0930 OT Time Calculation (min): 25 min Charges:  OT General Charges $OT Visit: 1 Procedure OT Evaluation $OT Eval Moderate Complexity: 1 Procedure  Guadelupe Sabin, OTR/L  [phone]  01/26/2016, 4:08 PM

## 2016-01-26 NOTE — Care Management Important Message (Signed)
Important Message  Patient Details  Name: Caroline [name] MRN: [id number] Date of Birth: 1935/03/10   Medicare Important Message Given:  Yes    Shamaya Kauer, Chauncey Reading, RN 01/26/2016, 10:26 AM

## 2016-01-26 NOTE — Progress Notes (Signed)
Patient loss IV access this AM. Dr. Caryn Section notified. Orders to leave IV access out.

## 2016-01-26 NOTE — Care Management Note (Addendum)
Case Management Note  Patient Details  Name: Caroline [name] MRN: [id number] Date of Birth: 1935/01/14  If discussed at Long Length of Stay Meetings, dates discussed:    Additional Comments: New recommendation from PT, now recommending SNF. Patient does not qualify for SNF. CSW made aware of recommendation. Will follow.  See previous CM note. Chelsye Suhre, Chauncey Reading, RN 01/26/2016, 2:52 PM

## 2016-01-27 ENCOUNTER — Encounter (HOSPITAL_COMMUNITY): Payer: Self-pay | Admitting: Internal Medicine

## 2016-01-27 DIAGNOSIS — W19XXXA Unspecified fall, initial encounter: Secondary | ICD-10-CM | POA: Diagnosis present

## 2016-01-27 DIAGNOSIS — E871 Hypo-osmolality and hyponatremia: Secondary | ICD-10-CM

## 2016-01-27 DIAGNOSIS — R339 Retention of urine, unspecified: Secondary | ICD-10-CM

## 2016-01-27 LAB — BASIC METABOLIC PANEL
ANION GAP: 9 (ref 5–15)
BUN: 15 mg/dL (ref 6–20)
CHLORIDE: 90 mmol/L — AB (ref 101–111)
CO2: 31 mmol/L (ref 22–32)
Calcium: 8.3 mg/dL — ABNORMAL LOW (ref 8.9–10.3)
Creatinine, Ser: 0.45 mg/dL (ref 0.44–1.00)
Glucose, Bld: 112 mg/dL — ABNORMAL HIGH (ref 65–99)
POTASSIUM: 3.9 mmol/L (ref 3.5–5.1)
SODIUM: 130 mmol/L — AB (ref 135–145)

## 2016-01-27 LAB — CBC
HCT: 32 % — ABNORMAL LOW (ref 36.0–46.0)
HEMOGLOBIN: 10.7 g/dL — AB (ref 12.0–15.0)
MCH: 27 pg (ref 26.0–34.0)
MCHC: 33.4 g/dL (ref 30.0–36.0)
MCV: 80.8 fL (ref 78.0–100.0)
PLATELETS: 225 10*3/uL (ref 150–400)
RBC: 3.96 MIL/uL (ref 3.87–5.11)
RDW: 14.7 % (ref 11.5–15.5)
WBC: 10.8 10*3/uL — AB (ref 4.0–10.5)

## 2016-01-27 MED ORDER — PANTOPRAZOLE SODIUM 40 MG PO TBEC: 40.0000 mg | DELAYED_RELEASE_TABLET | Freq: Every day | ORAL | Status: DC

## 2016-01-27 MED ORDER — TRAMADOL-ACETAMINOPHEN 37.5-325 MG PO TABS: 1.0000 | ORAL_TABLET | Freq: Four times a day (QID) | ORAL | Status: DC | PRN

## 2016-01-27 MED ORDER — PREDNISONE 10 MG PO TABS: ORAL_TABLET | ORAL | Status: DC

## 2016-01-27 MED ORDER — ADULT MULTIVITAMIN W/MINERALS CH: 1.0000 | ORAL_TABLET | Freq: Every day | ORAL | Status: DC

## 2016-01-27 MED ORDER — LEVALBUTEROL HCL 0.63 MG/3ML IN NEBU: 0.6300 mg | INHALATION_SOLUTION | RESPIRATORY_TRACT | Status: AC | PRN

## 2016-01-27 MED ORDER — HYDRALAZINE HCL 10 MG PO TABS: 10.0000 mg | ORAL_TABLET | Freq: Two times a day (BID) | ORAL | Status: DC

## 2016-01-27 MED ORDER — CILOSTAZOL 100 MG PO TABS: 50.0000 mg | ORAL_TABLET | Freq: Two times a day (BID) | ORAL | Status: DC

## 2016-01-27 MED ORDER — SENNOSIDES-DOCUSATE SODIUM 8.6-50 MG PO TABS: 2.0000 | ORAL_TABLET | Freq: Every day | ORAL | Status: AC

## 2016-01-27 NOTE — Progress Notes (Signed)
Patient with orders to be discharge home. Discharge instructions given, patient and daughters verbalized understanding. Prescriptions given. Patient stable. Patient left in private vehicle with daughter.

## 2016-01-27 NOTE — Discharge Summary (Signed)
Physician Discharge Summary  Caroline [name] [id number] DOB: 09/27/1934 DOA: 01/24/2016  PCP: Glo Herring., MD  Admit date: 01/24/2016 Discharge date: 01/27/2016  Time spent: Greater than 30 minutes  Recommendations for Outpatient Follow-up:  1. Home health physical therapy and other services ordered at the time of discharge.  2. Recommend recheck of the patient's serum potassium level.    Discharge Diagnoses:  1. Acute low back pain with inability to ambulate due to pain and significant lumbar arthropathy/facet arthropathy. 2. Hypotension, likely secondary to fentanyl. 3. Chronic essential hypertension. 4. Chronic atrial flutter on Pradaxa. 5. Abdominal aortic aneurysm measuring 4.3 x 4.0 centimeters per CT. (Not a new finding as the patient and family were aware of this). 6. 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 7. Hyperkalemia thought to be secondary to potassium and IV fluids. 8. Fecal impaction/constipation. 9. Bright red blood per rectum noted during stool evacuation, likely from hemorrhoidal bleeding. 10. Urinary retention, transiently, secondary to constipation. 11. Hyponatremia, possibly secondary to hydrochlorothiazide. 12. Severe protein calorie malnutrition.  Discharge Condition: Stable  Diet recommendation: Heart healthy  Filed Weights   01/24/16 0554 01/27/16 0508  Weight: 43.092 kg (95 lb) 42.91 kg (94 lb 9.6 oz)    History of present illness:  Patient is a 81 year old woman with a history of O2 dependent COPD, TIA, HTN, CAD, and atrial fibrillation on Pradaxa, who presented to the ED on 01/24/2016 complaining of lower back pain after falling at home; she tripped over her oxygen concentrator. Apparently, she was in so much pain that she was unable to ambulate. In the ED, she was afebrile and hemodynamically stable. CT of her lumbar spine revealed widespread multilevel arthropathy and abdominal aortic aneurysm measuring 4.3  x 4.0 cm. Her pelvis x-ray revealed no acute abnormality, but with a large colonic stool burden. She was admitted for further evaluation and management.     Hospital Course:  1. Acute low back pain with inability to ambulate due to pain and significant lumbar arthropathy. The patient has multiple allergies to opiates, but she was able to tolerate IV fentanyl. It was ordered when necessary for worsening pain. In addition, twice a day dosing of prednisone, twice a day dosing of Ultracet, and twice a day dosing of Tylenol were ordered. Fentanyl had caused some asymptomatic hypotension and confusion, so it was eventually stopped. Patient continued to have what she called excruciating low back pain when she was placed on the bed pan and when she tried to get out of bed to the chair. She was able to move her legs and had good sensation in her legs and was able to flex and extend her hips and her legs with assistance. MRI of her back was ordered, but she declined due to claustrophobia and anxiousness. PT was consulted and recommended home health PT. -The patient was discharged on Ultracet and a short prednisone taper. She was encouraged to ambulate through the pain with assistance from her family. Home health PT was ordered at the time of discharge.  Hypotension in a patient with history of hypertension. Patient is treated chronically with HCTZ and verapamil. They were temporarily held earlier today due to her blood pressure falling into the 80s. She was given a bolus of IV fluids which improved her blood pressure. -The dose of fentanyl was decreased and then discontinued. -Her blood pressure increased. Due to hyponatremia, hydrochlorothiazide was discontinued. She was prescribed hydralazine 10 mg twice a day instead. She was instructed to continue  verapamil as previously prescribed.  Hyperkalemia. Patient had been restarted on potassium chloride supplement. IV fluids were started with KCl added. Her serum  potassium which was 3.9 on admission surprisingly increased to 5.5. Follow-up serum potassium was 6.1. There was a suspicion of hemolysis, but the lab did not confirm this. -She was treated with gentle bicarbonate infusion and holding the potassium chloride. Her potassium was 3.9 at the time of discharge.  Hyponatremia. The patient's serum sodium decreased to 130. This was thought to be secondary to hydrochlorothiazide. It was discontinued at the time of discharge.  Obstipation/fecal impaction. Patient was started on Senokot. Dulcolax suppository was given, but was unsuccessful. L4, soapsuds enema was given with some success as the patient had a large bowel movement. Nursing reported some bright red blood in her stool, which was likely from hemorrhoids and the stool burden in the setting of anticoagulation. -Anusol HC was started.The bleeding was short-lived.  Urinary retention. Apparently, the patient had not voided following admission. In an out catheter was ordered which drained 775 cc of dark urine. The urinalysis was unremarkable. -Following the evacuation of stool, she was able to void spontaneously.  Chronic respiratory failure with hypoxia secondary to COPD. The patient was restarted on Spiriva. Scheduled Xopenex nebulizer was ordered to help with her chronic mild wheezing.  Chronic atrial fibrillation. Patient is treated chronically with verapamil for rate control and Pradaxa for anticoagulation. Both were continued. -Her heart rate was controlled.    Procedures:  None  Consultations:  None  Discharge Exam: Filed Vitals:   01/27/16 0508 01/27/16 0806  BP: 163/96   Pulse: 119 106  Temp: 97.9 F (36.6 C)   Resp: 20 19  Oxygen saturation 97% on supplement oxygen.  General exam: Patient is no acute distress. Respiratory system:  occasional wheezes and crackles, partially cleared with coughing.Marland Kitchen Respiratory effort normal. Cardiovascular system: Irregular, irregular.  No pedal edema. Gastrointestinal system: Positive bowel sounds, soft, nontender, nondistended. Central nervous system: Alert and oriented. Strength of her upper extremities 5 over 5. Strength of her lower extremities 4 minus over 5 secondary to pain. Sensation over the lower extremities intact to cold touch. Patient able to flex and extend her feet against examiner's hands. Extremities/musculoskeletal: She has mild to moderate tenderness over the sacral coccyx area without edema or warmth. Skin: No rashes, lesions or ulcers Psychiatry: Not as anxious and more willing to try to accommodate the exam.    Discharge Instructions    Current Discharge Medication List    START taking these medications   Details  hydrALAZINE (APRESOLINE) 10 MG tablet Take 1 tablet (10 mg total) by mouth 2 (two) times daily. New medication to help with treatment of your high blood pressure. Qty: 60 tablet, Refills: 2    Multiple Vitamin (MULTIVITAMIN WITH MINERALS) TABS tablet Take 1 tablet by mouth daily.    predniSONE (DELTASONE) 10 MG tablet Take 1 tablet daily for 3 days and then a half a tablet daily for 4 days and then stop. Qty: 5 tablet, Refills: 0    senna-docusate (SENOKOT-S) 8.6-50 MG tablet Take 2 tablets by mouth at bedtime. For constipation    traMADol-acetaminophen (ULTRACET) 37.5-325 MG tablet Take 1 tablet by mouth every 6 (six) hours as needed for moderate pain. Qty: 45 tablet, Refills: 0      CONTINUE these medications which have CHANGED   Details  cilostazol (PLETAL) 100 MG tablet Take 0.5 tablets (50 mg total) by mouth 2 (two) times daily. Qty: 30 tablet,  Refills: 6    levalbuterol (XOPENEX) 0.63 MG/3ML nebulizer solution Take 3 mLs (0.63 mg total) by nebulization every 4 (four) hours as needed for wheezing or shortness of breath.    pantoprazole (PROTONIX) 40 MG tablet Take 1 tablet (40 mg total) by mouth daily. Qty: 30 tablet, Refills: 6      CONTINUE these medications which have  NOT CHANGED   Details  dabigatran (PRADAXA) 150 MG CAPS capsule Take 150 mg by mouth 2 (two) times daily.    potassium chloride SA (K-DUR,KLOR-CON) 20 MEQ tablet Take 20 mEq by mouth daily.    tiotropium (SPIRIVA) 18 MCG inhalation capsule Place 18 mcg into inhaler and inhale at bedtime.    verapamil (CALAN-SR) 240 MG CR tablet Take 240 mg by mouth daily. Takes at lunch time      STOP taking these medications     hydrochlorothiazide (MICROZIDE) 12.5 MG capsule        Allergies  Allergen Reactions  . Ibuprofen     Gi upset  . Pnu-Imune [Pneumococcal Polysaccharide Vaccine]     Huge knot at site and rash  . Advair Diskus [Fluticasone-Salmeterol]     Confusion and shortness of breath  . Albuterol     Confusion and shortness of breath  . Amoxicillin Hives  . Cephalexin Hives  . Codeine     confusion  . Entex Pac [Pseudoephedrine-Gg & Dm] Other (See Comments)    Upper respiratory.   . Etanercept     vertigo  . Gualenic Acid Other (See Comments)    unknown  . Lorazepam     hallucinations  . Nubain [Nalbuphine Hcl]   . Oxycodone Hcl Nausea And Vomiting  . Oxycodone-Acetaminophen Nausea And Vomiting  . Roxicodone [Oxycodone Hcl]   . Tylox [Oxycodone-Acetaminophen]    Follow-up Information    Follow up with Glo Herring., MD. Schedule an appointment as soon as possible for a visit in 1 week.   Specialty:  Internal Medicine   Contact information:   [address] [place]  [phone]        The results of significant diagnostics from this hospitalization (including imaging, microbiology, ancillary and laboratory) are listed below for reference.    Significant Diagnostic Studies: Dg Lumbar Spine Complete  01/24/2016  CLINICAL DATA:  Status post trip and fall this morning with onset of low back pain. Initial encounter. EXAM: LUMBAR SPINE - COMPLETE 4+ VIEW COMPARISON:  CT abdomen and pelvis 06/30/2014. FINDINGS: There is no fracture. Trace  anterolisthesis L5 on S1 due to facet arthropathy is unchanged. Intervertebral disc space height is maintained. The patient has an abdominal aortic aneurysm measuring 4.4 cm AP, increased from approximately 3.1 cm on the prior examination. Large colonic stool burden is noted. IMPRESSION: No acute abnormality. 4.4 cm abdominal aortic aneurysm is increased from 3.1 cm on the prior exam. Recommend follow-up ultrasound in 1 year. This recommendation follows ACR consensus guidelines. Large colonic stool burden. Electronically Signed   By: Inge Rise M.D.   On: 01/24/2016 07:12   Dg Pelvis 1-2 Views  01/24/2016  CLINICAL DATA:  Status post trip and fall this morning with onset of pelvic pain. Initial encounter. EXAM: PELVIS - 1-2 VIEW COMPARISON:  CT abdomen and pelvis 06/30/2014. FINDINGS: There is no evidence of pelvic fracture or diastasis. No pelvic bone lesions are seen. Bones are osteopenic. Large colonic stool burden is noted. IMPRESSION: No acute abnormality. Osteopenia. Large colonic stool burden. Electronically Signed   By: Inge Rise  M.D.   On: 01/24/2016 07:13   Ct Lumbar Spine Wo Contrast  01/24/2016  CLINICAL DATA:  Lumbago.  Fall earlier today EXAM: CT LUMBAR SPINE WITHOUT CONTRAST TECHNIQUE: Multidetector CT imaging of the lumbar spine was performed without intravenous contrast administration. Multiplanar CT image reconstructions were also generated. COMPARISON:  CT abdomen and pelvis with bony reformats June 30, 2014; lumbar radiographs January 24, 2016 FINDINGS: Bones are osteoporotic. There is no evidence of acute fracture or spondylolisthesis. There is slight disc space narrowing at T12-L1. Other disc spaces appear unremarkable. At T12-L1, there is mild generalized disc bulging. There is mild facet hypertrophy bilaterally. There is no nerve root edema or effacement. No disc extrusion or stenosis. At L1-2, there is moderate facet hypertrophy bilaterally. There is mild diffuse disc  bulging. There is no nerve root edema or effacement. No disc extrusion or stenosis. At L2-3, there is moderate facet hypertrophy bilaterally and mild diffuse disc bulging. No nerve root edema or effacement. No disc extrusion or stenosis. At L3-4, there is moderate facet hypertrophy bilaterally and mild diffuse disc bulging. No nerve root edema or effacement. No disc extrusion or stenosis. At L4-5, there is a broad-based disc bulging and moderate facet hypertrophy bilaterally. There is mild ligamentum flava hypertrophy bilaterally causing borderline lateral canal stenosis and L4-5 bilaterally. No central canal stenosis noted. No disc extrusion. No nerve root edema or effacement evident. At L5-S1, there is moderate facet hypertrophy bilaterally. There is broad-based disc protrusion without appreciable nerve root edema or effacement. Node disc extrusion or stenosis evident. There is stable calcification between the L2 and L3 spinous processes, possibly residua of old trauma. There is slight lumbar levoscoliosis. There is degenerative change in the sacroiliac joints bilaterally, slightly more severe on the right than on the left. There is extensive calcification in the distal aorta and common iliac arteries. There is aneurysmal dilatation of the distal abdominal aorta with a maximum transverse diameter of 4.3 x 4.0 cm. This aneurysm extends to just above the bifurcation. It arises inferior to the renal arteries. There is no periaortic fluid. IMPRESSION: No fracture or spondylolisthesis. Multilevel arthropathy. Borderline lateral canal stenosis at L4-5, multifactorial. No central stenosis. No disc extrusion or nerve root edema/effacement. Abdominal aortic aneurysm with maximum transverse diameter of 4.3 x 4.0 cm. Recommend followup by ultrasound in 1 year. This recommendation follows ACR consensus guidelines: White Paper of the ACR Incidental Findings Committee II on Vascular Findings. J Am Coll Radiol 2013; 10:789-794.  As this aneurysm has increased in size compared to prior study from December 2015, vascular surgery consultation may also be advisable at this time. Bones osteoporotic. Electronically Signed   By: Lowella Grip III M.D.   On: 01/24/2016 10:14    Microbiology: No results found for this or any previous visit (from the past 240 hour(s)).   Labs: Basic Metabolic Panel:  Recent Labs Lab 01/25/16 0541 01/25/16 0746 01/25/16 1711 01/26/16 0535 01/27/16 0500  NA 137 135 131* 132* 130*  K 5.5* 6.1* 4.8 4.4 3.9  CL 100* 101 98* 94* 90*  CO2 30 28 26 31 31   GLUCOSE 125* 137* 240* 97 112*  BUN 19 21* 26* 23* 15  CREATININE 0.52 0.65 0.63 0.47 0.45  CALCIUM 8.9 8.7* 8.6* 8.4* 8.3*  MG  --   --  1.8  --   --    Liver Function Tests: No results for input(s): AST, ALT, ALKPHOS, BILITOT, PROT, ALBUMIN in the last 168 hours. No results for input(s): LIPASE,  AMYLASE in the last 168 hours. No results for input(s): AMMONIA in the last 168 hours. CBC:  Recent Labs Lab 01/24/16 0640 01/25/16 0541 01/26/16 0535 01/27/16 0500  WBC 7.7 10.1 12.8* 10.8*  NEUTROABS 5.4  --   --   --   HGB 11.7* 11.1* 9.8* 10.7*  HCT 35.6* 35.0* 29.3* 32.0*  MCV 81.5 82.7 81.2 80.8  PLT 203 220 217 225   Cardiac Enzymes: No results for input(s): CKTOTAL, CKMB, CKMBINDEX, TROPONINI in the last 168 hours. BNP: BNP (last 3 results) No results for input(s): BNP in the last 8760 hours.  ProBNP (last 3 results) No results for input(s): PROBNP in the last 8760 hours.  CBG: No results for input(s): GLUCAP in the last 168 hours.     Signed:  Tabor Bartram MD.  [facility] 01/27/2016, 2:23 PM

## 2016-01-30 DIAGNOSIS — J449 Chronic obstructive pulmonary disease, unspecified: Secondary | ICD-10-CM | POA: Diagnosis not present

## 2016-01-30 DIAGNOSIS — M6281 Muscle weakness (generalized): Secondary | ICD-10-CM | POA: Diagnosis not present

## 2016-01-30 DIAGNOSIS — Z9181 History of falling: Secondary | ICD-10-CM | POA: Diagnosis not present

## 2016-01-30 DIAGNOSIS — Z9981 Dependence on supplemental oxygen: Secondary | ICD-10-CM | POA: Diagnosis not present

## 2016-01-30 DIAGNOSIS — J9611 Chronic respiratory failure with hypoxia: Secondary | ICD-10-CM | POA: Diagnosis not present

## 2016-01-30 DIAGNOSIS — I10 Essential (primary) hypertension: Secondary | ICD-10-CM | POA: Diagnosis not present

## 2016-01-30 DIAGNOSIS — I482 Chronic atrial fibrillation: Secondary | ICD-10-CM | POA: Diagnosis not present

## 2016-01-30 DIAGNOSIS — Z87891 Personal history of nicotine dependence: Secondary | ICD-10-CM | POA: Diagnosis not present

## 2016-01-30 DIAGNOSIS — R261 Paralytic gait: Secondary | ICD-10-CM | POA: Diagnosis not present

## 2016-01-30 DIAGNOSIS — I739 Peripheral vascular disease, unspecified: Secondary | ICD-10-CM | POA: Diagnosis not present

## 2016-01-30 DIAGNOSIS — I2510 Atherosclerotic heart disease of native coronary artery without angina pectoris: Secondary | ICD-10-CM | POA: Diagnosis not present

## 2016-01-30 DIAGNOSIS — M545 Low back pain: Secondary | ICD-10-CM | POA: Diagnosis not present

## 2016-01-30 DIAGNOSIS — Z7902 Long term (current) use of antithrombotics/antiplatelets: Secondary | ICD-10-CM | POA: Diagnosis not present

## 2016-01-30 DIAGNOSIS — I714 Abdominal aortic aneurysm, without rupture: Secondary | ICD-10-CM | POA: Diagnosis not present

## 2016-01-31 DIAGNOSIS — M6281 Muscle weakness (generalized): Secondary | ICD-10-CM | POA: Diagnosis not present

## 2016-01-31 DIAGNOSIS — I714 Abdominal aortic aneurysm, without rupture: Secondary | ICD-10-CM | POA: Diagnosis not present

## 2016-01-31 DIAGNOSIS — I10 Essential (primary) hypertension: Secondary | ICD-10-CM | POA: Diagnosis not present

## 2016-01-31 DIAGNOSIS — R261 Paralytic gait: Secondary | ICD-10-CM | POA: Diagnosis not present

## 2016-01-31 DIAGNOSIS — M545 Low back pain: Secondary | ICD-10-CM | POA: Diagnosis not present

## 2016-01-31 DIAGNOSIS — I2510 Atherosclerotic heart disease of native coronary artery without angina pectoris: Secondary | ICD-10-CM | POA: Diagnosis not present

## 2016-02-01 DIAGNOSIS — R261 Paralytic gait: Secondary | ICD-10-CM | POA: Diagnosis not present

## 2016-02-01 DIAGNOSIS — M6281 Muscle weakness (generalized): Secondary | ICD-10-CM | POA: Diagnosis not present

## 2016-02-01 DIAGNOSIS — E43 Unspecified severe protein-calorie malnutrition: Secondary | ICD-10-CM | POA: Diagnosis not present

## 2016-02-01 DIAGNOSIS — I959 Hypotension, unspecified: Secondary | ICD-10-CM | POA: Diagnosis not present

## 2016-02-01 DIAGNOSIS — I10 Essential (primary) hypertension: Secondary | ICD-10-CM | POA: Diagnosis not present

## 2016-02-01 DIAGNOSIS — I2510 Atherosclerotic heart disease of native coronary artery without angina pectoris: Secondary | ICD-10-CM | POA: Diagnosis not present

## 2016-02-01 DIAGNOSIS — J441 Chronic obstructive pulmonary disease with (acute) exacerbation: Secondary | ICD-10-CM | POA: Diagnosis not present

## 2016-02-01 DIAGNOSIS — M545 Low back pain: Secondary | ICD-10-CM | POA: Diagnosis not present

## 2016-02-01 DIAGNOSIS — Z681 Body mass index (BMI) 19 or less, adult: Secondary | ICD-10-CM | POA: Diagnosis not present

## 2016-02-01 DIAGNOSIS — K922 Gastrointestinal hemorrhage, unspecified: Secondary | ICD-10-CM | POA: Diagnosis not present

## 2016-02-01 DIAGNOSIS — I714 Abdominal aortic aneurysm, without rupture: Secondary | ICD-10-CM | POA: Diagnosis not present

## 2016-02-01 DIAGNOSIS — J449 Chronic obstructive pulmonary disease, unspecified: Secondary | ICD-10-CM | POA: Diagnosis not present

## 2016-02-02 DIAGNOSIS — I2510 Atherosclerotic heart disease of native coronary artery without angina pectoris: Secondary | ICD-10-CM | POA: Diagnosis not present

## 2016-02-02 DIAGNOSIS — R261 Paralytic gait: Secondary | ICD-10-CM | POA: Diagnosis not present

## 2016-02-02 DIAGNOSIS — I10 Essential (primary) hypertension: Secondary | ICD-10-CM | POA: Diagnosis not present

## 2016-02-02 DIAGNOSIS — I714 Abdominal aortic aneurysm, without rupture: Secondary | ICD-10-CM | POA: Diagnosis not present

## 2016-02-02 DIAGNOSIS — M6281 Muscle weakness (generalized): Secondary | ICD-10-CM | POA: Diagnosis not present

## 2016-02-02 DIAGNOSIS — M545 Low back pain: Secondary | ICD-10-CM | POA: Diagnosis not present

## 2016-02-05 DIAGNOSIS — I10 Essential (primary) hypertension: Secondary | ICD-10-CM | POA: Diagnosis not present

## 2016-02-05 DIAGNOSIS — M545 Low back pain: Secondary | ICD-10-CM | POA: Diagnosis not present

## 2016-02-05 DIAGNOSIS — M6281 Muscle weakness (generalized): Secondary | ICD-10-CM | POA: Diagnosis not present

## 2016-02-05 DIAGNOSIS — R261 Paralytic gait: Secondary | ICD-10-CM | POA: Diagnosis not present

## 2016-02-05 DIAGNOSIS — I2510 Atherosclerotic heart disease of native coronary artery without angina pectoris: Secondary | ICD-10-CM | POA: Diagnosis not present

## 2016-02-05 DIAGNOSIS — I714 Abdominal aortic aneurysm, without rupture: Secondary | ICD-10-CM | POA: Diagnosis not present

## 2016-02-06 ENCOUNTER — Encounter (HOSPITAL_COMMUNITY): Payer: Self-pay

## 2016-02-06 ENCOUNTER — Observation Stay (HOSPITAL_COMMUNITY): Payer: Medicare Other

## 2016-02-06 ENCOUNTER — Emergency Department (HOSPITAL_COMMUNITY): Payer: Medicare Other

## 2016-02-06 ENCOUNTER — Inpatient Hospital Stay (HOSPITAL_COMMUNITY)
Admission: EM | Admit: 2016-02-06 | Discharge: 2016-02-10 | DRG: 190 | Disposition: A | Discharge status: Skilled Nursing Facility | Payer: Medicare Other | Attending: Internal Medicine | Admitting: Internal Medicine

## 2016-02-06 DIAGNOSIS — Z8673 Personal history of transient ischemic attack (TIA), and cerebral infarction without residual deficits: Secondary | ICD-10-CM

## 2016-02-06 DIAGNOSIS — M545 Low back pain: Secondary | ICD-10-CM | POA: Diagnosis not present

## 2016-02-06 DIAGNOSIS — I2510 Atherosclerotic heart disease of native coronary artery without angina pectoris: Secondary | ICD-10-CM | POA: Diagnosis not present

## 2016-02-06 DIAGNOSIS — Y92009 Unspecified place in unspecified non-institutional (private) residence as the place of occurrence of the external cause: Secondary | ICD-10-CM

## 2016-02-06 DIAGNOSIS — Z820 Family history of epilepsy and other diseases of the nervous system: Secondary | ICD-10-CM

## 2016-02-06 DIAGNOSIS — Z87891 Personal history of nicotine dependence: Secondary | ICD-10-CM

## 2016-02-06 DIAGNOSIS — J9611 Chronic respiratory failure with hypoxia: Secondary | ICD-10-CM | POA: Diagnosis present

## 2016-02-06 DIAGNOSIS — J189 Pneumonia, unspecified organism: Secondary | ICD-10-CM | POA: Diagnosis present

## 2016-02-06 DIAGNOSIS — I10 Essential (primary) hypertension: Secondary | ICD-10-CM | POA: Diagnosis not present

## 2016-02-06 DIAGNOSIS — W19XXXA Unspecified fall, initial encounter: Secondary | ICD-10-CM | POA: Diagnosis present

## 2016-02-06 DIAGNOSIS — Z825 Family history of asthma and other chronic lower respiratory diseases: Secondary | ICD-10-CM | POA: Diagnosis not present

## 2016-02-06 DIAGNOSIS — M25551 Pain in right hip: Secondary | ICD-10-CM | POA: Diagnosis not present

## 2016-02-06 DIAGNOSIS — J441 Chronic obstructive pulmonary disease with (acute) exacerbation: Secondary | ICD-10-CM | POA: Diagnosis present

## 2016-02-06 DIAGNOSIS — J438 Other emphysema: Secondary | ICD-10-CM | POA: Diagnosis not present

## 2016-02-06 DIAGNOSIS — J449 Chronic obstructive pulmonary disease, unspecified: Secondary | ICD-10-CM | POA: Diagnosis present

## 2016-02-06 DIAGNOSIS — M5489 Other dorsalgia: Secondary | ICD-10-CM | POA: Diagnosis not present

## 2016-02-06 DIAGNOSIS — I714 Abdominal aortic aneurysm, without rupture: Secondary | ICD-10-CM | POA: Diagnosis not present

## 2016-02-06 DIAGNOSIS — R261 Paralytic gait: Secondary | ICD-10-CM | POA: Diagnosis not present

## 2016-02-06 DIAGNOSIS — I4891 Unspecified atrial fibrillation: Secondary | ICD-10-CM | POA: Diagnosis not present

## 2016-02-06 DIAGNOSIS — R05 Cough: Secondary | ICD-10-CM | POA: Diagnosis not present

## 2016-02-06 DIAGNOSIS — I739 Peripheral vascular disease, unspecified: Secondary | ICD-10-CM | POA: Diagnosis present

## 2016-02-06 DIAGNOSIS — G8929 Other chronic pain: Secondary | ICD-10-CM | POA: Diagnosis present

## 2016-02-06 DIAGNOSIS — J440 Chronic obstructive pulmonary disease with acute lower respiratory infection: Secondary | ICD-10-CM | POA: Diagnosis not present

## 2016-02-06 DIAGNOSIS — R52 Pain, unspecified: Secondary | ICD-10-CM | POA: Diagnosis not present

## 2016-02-06 DIAGNOSIS — Z7901 Long term (current) use of anticoagulants: Secondary | ICD-10-CM

## 2016-02-06 DIAGNOSIS — I480 Paroxysmal atrial fibrillation: Secondary | ICD-10-CM | POA: Diagnosis not present

## 2016-02-06 DIAGNOSIS — M6281 Muscle weakness (generalized): Secondary | ICD-10-CM | POA: Diagnosis not present

## 2016-02-06 DIAGNOSIS — M25552 Pain in left hip: Secondary | ICD-10-CM | POA: Diagnosis not present

## 2016-02-06 DIAGNOSIS — K219 Gastro-esophageal reflux disease without esophagitis: Secondary | ICD-10-CM | POA: Diagnosis present

## 2016-02-06 LAB — CBC WITH DIFFERENTIAL/PLATELET
BASOS ABS: 0 10*3/uL (ref 0.0–0.1)
Basophils Relative: 0 %
EOS ABS: 0.1 10*3/uL (ref 0.0–0.7)
EOS PCT: 1 %
HCT: 33.5 % — ABNORMAL LOW (ref 36.0–46.0)
HEMOGLOBIN: 10.9 g/dL — AB (ref 12.0–15.0)
LYMPHS PCT: 12 %
Lymphs Abs: 1.2 10*3/uL (ref 0.7–4.0)
MCH: 27.3 pg (ref 26.0–34.0)
MCHC: 32.5 g/dL (ref 30.0–36.0)
MCV: 84 fL (ref 78.0–100.0)
Monocytes Absolute: 0.7 10*3/uL (ref 0.1–1.0)
Monocytes Relative: 7 %
NEUTROS PCT: 80 %
Neutro Abs: 8 10*3/uL — ABNORMAL HIGH (ref 1.7–7.7)
PLATELETS: 285 10*3/uL (ref 150–400)
RBC: 3.99 MIL/uL (ref 3.87–5.11)
RDW: 17 % — ABNORMAL HIGH (ref 11.5–15.5)
WBC: 10.1 10*3/uL (ref 4.0–10.5)

## 2016-02-06 LAB — URINALYSIS, ROUTINE W REFLEX MICROSCOPIC
BILIRUBIN URINE: NEGATIVE
GLUCOSE, UA: NEGATIVE mg/dL
HGB URINE DIPSTICK: NEGATIVE
Ketones, ur: NEGATIVE mg/dL
Leukocytes, UA: NEGATIVE
Nitrite: NEGATIVE
PH: 5.5 (ref 5.0–8.0)
SPECIFIC GRAVITY, URINE: 1.025 (ref 1.005–1.030)

## 2016-02-06 LAB — COMPREHENSIVE METABOLIC PANEL
ALT: 19 U/L (ref 14–54)
AST: 19 U/L (ref 15–41)
Albumin: 3.2 g/dL — ABNORMAL LOW (ref 3.5–5.0)
Alkaline Phosphatase: 124 U/L (ref 38–126)
Anion gap: 8 (ref 5–15)
BUN: 21 mg/dL — AB (ref 6–20)
CHLORIDE: 102 mmol/L (ref 101–111)
CO2: 32 mmol/L (ref 22–32)
CREATININE: 0.51 mg/dL (ref 0.44–1.00)
Calcium: 8.7 mg/dL — ABNORMAL LOW (ref 8.9–10.3)
GFR calc Af Amer: >60 mL/min (ref >60)
GFR calc non Af Amer: >60 mL/min (ref >60)
Glucose, Bld: 121 mg/dL — ABNORMAL HIGH (ref 65–99)
Potassium: 3.4 mmol/L — ABNORMAL LOW (ref 3.5–5.1)
SODIUM: 142 mmol/L (ref 135–145)
Total Bilirubin: 0.6 mg/dL (ref 0.3–1.2)
Total Protein: 5.8 g/dL — ABNORMAL LOW (ref 6.5–8.1)

## 2016-02-06 LAB — URINE MICROSCOPIC-ADD ON

## 2016-02-06 LAB — LACTIC ACID, PLASMA: LACTIC ACID, VENOUS: 0.8 mmol/L (ref 0.5–1.9)

## 2016-02-06 MED ORDER — LEVOFLOXACIN IN D5W 500 MG/100ML IV SOLN
500.0000 mg | Freq: Once | INTRAVENOUS | Status: AC
  Administered 2016-02-06: 500 mg via INTRAVENOUS
  Filled 2016-02-06: qty 100

## 2016-02-06 MED ORDER — DABIGATRAN ETEXILATE MESYLATE 150 MG PO CAPS
150.0000 mg | ORAL_CAPSULE | Freq: Two times a day (BID) | ORAL | Status: DC
Start: 2016-02-06 — End: 2016-02-10
  Administered 2016-02-07 – 2016-02-10 (×7): 150 mg via ORAL
  Filled 2016-02-06 (×12): qty 1

## 2016-02-06 MED ORDER — VERAPAMIL HCL ER 240 MG PO TBCR
240.0000 mg | EXTENDED_RELEASE_TABLET | Freq: Every day | ORAL | Status: DC
Start: 2016-02-07 — End: 2016-02-10
  Administered 2016-02-07 – 2016-02-10 (×4): 240 mg via ORAL
  Filled 2016-02-06 (×4): qty 1

## 2016-02-06 MED ORDER — CETYLPYRIDINIUM CHLORIDE 0.05 % MT LIQD
7.0000 mL | Freq: Two times a day (BID) | OROMUCOSAL | Status: DC
  Administered 2016-02-07 – 2016-02-10 (×7): 7 mL via OROMUCOSAL

## 2016-02-06 MED ORDER — IPRATROPIUM-ALBUTEROL 0.5-2.5 (3) MG/3ML IN SOLN: 3.0000 mL | Freq: Four times a day (QID) | RESPIRATORY_TRACT | Status: DC | PRN

## 2016-02-06 MED ORDER — CILOSTAZOL 100 MG PO TABS
50.0000 mg | ORAL_TABLET | Freq: Two times a day (BID) | ORAL | Status: DC
  Administered 2016-02-07 – 2016-02-10 (×7): 50 mg via ORAL
  Filled 2016-02-06 (×8): qty 0.5
  Filled 2016-02-06 (×2): qty 1
  Filled 2016-02-06 (×3): qty 0.5

## 2016-02-06 MED ORDER — CYCLOBENZAPRINE HCL 10 MG PO TABS
5.0000 mg | ORAL_TABLET | Freq: Three times a day (TID) | ORAL | Status: DC
  Administered 2016-02-07 – 2016-02-10 (×11): 5 mg via ORAL
  Filled 2016-02-06 (×11): qty 1

## 2016-02-06 MED ORDER — SODIUM CHLORIDE 0.9% FLUSH
3.0000 mL | Freq: Two times a day (BID) | INTRAVENOUS | Status: DC
  Administered 2016-02-06 – 2016-02-10 (×8): 3 mL via INTRAVENOUS

## 2016-02-06 MED ORDER — HYDROCODONE-ACETAMINOPHEN 5-325 MG PO TABS
1.0000 | ORAL_TABLET | Freq: Four times a day (QID) | ORAL | Status: DC | PRN
  Administered 2016-02-07 – 2016-02-10 (×5): 1 via ORAL
  Filled 2016-02-06 (×6): qty 1

## 2016-02-06 MED ORDER — FENTANYL CITRATE (PF) 100 MCG/2ML IJ SOLN
25.0000 ug | Freq: Once | INTRAMUSCULAR | Status: AC
  Administered 2016-02-06: 25 ug via INTRAVENOUS
  Filled 2016-02-06: qty 2

## 2016-02-06 MED ORDER — LEVOFLOXACIN IN D5W 750 MG/150ML IV SOLN
750.0000 mg | INTRAVENOUS | Status: DC
Start: 2016-02-08 — End: 2016-02-09
  Administered 2016-02-08: 750 mg via INTRAVENOUS
  Filled 2016-02-06: qty 150

## 2016-02-06 MED ORDER — SODIUM CHLORIDE 0.9% FLUSH: 3.0000 mL | INTRAVENOUS | Status: DC | PRN

## 2016-02-06 MED ORDER — SODIUM CHLORIDE 0.9 % IV SOLN: 250.0000 mL | INTRAVENOUS | Status: DC | PRN

## 2016-02-06 MED ORDER — POTASSIUM CHLORIDE CRYS ER 20 MEQ PO TBCR
40.0000 meq | EXTENDED_RELEASE_TABLET | Freq: Once | ORAL | Status: AC
  Administered 2016-02-06: 40 meq via ORAL
  Filled 2016-02-06: qty 2

## 2016-02-06 MED ORDER — METHYLPREDNISOLONE SODIUM SUCC 125 MG IJ SOLR
60.0000 mg | Freq: Four times a day (QID) | INTRAMUSCULAR | Status: DC
  Administered 2016-02-06 – 2016-02-10 (×15): 60 mg via INTRAVENOUS
  Filled 2016-02-06 (×15): qty 2

## 2016-02-06 MED ORDER — HYDRALAZINE HCL 10 MG PO TABS
10.0000 mg | ORAL_TABLET | Freq: Two times a day (BID) | ORAL | Status: DC
  Administered 2016-02-07 – 2016-02-10 (×6): 10 mg via ORAL
  Filled 2016-02-06 (×7): qty 1

## 2016-02-06 MED ORDER — LEVALBUTEROL HCL 0.63 MG/3ML IN NEBU: 0.6300 mg | INHALATION_SOLUTION | Freq: Four times a day (QID) | RESPIRATORY_TRACT | Status: DC | PRN

## 2016-02-06 MED ORDER — PANTOPRAZOLE SODIUM 40 MG PO TBEC
40.0000 mg | DELAYED_RELEASE_TABLET | Freq: Every day | ORAL | Status: DC
Start: 2016-02-07 — End: 2016-02-10
  Administered 2016-02-07 – 2016-02-10 (×4): 40 mg via ORAL
  Filled 2016-02-06 (×5): qty 1

## 2016-02-06 NOTE — H&P (Signed)
TRH H&P   Patient Demographics:    Caroline [name], is a 81 y.o. female  MRN: [id number]  DOB - 1934-08-03  Admit Date - 02/06/2016  Outpatient Primary MD for the patient is Glo Herring., MD  Referring MD/NP/PA: Dr. Roderic Palau  Patient coming from: Home  Chief Complaint  Patient presents with  . Back Pain      HPI:    Caroline [name]  is a 81 y.o. female, With history of COPD, atrial fibrillation on chronic anticoagulation, spinal stenosis number peripheral arterial disease, hypertension, CVA, AAA who was sent to the hospital by patient's PCP for pain control. Apparently patient fell on 01/22/2016 at home, at that time imaging studies including CT of the lumbar spine, pelvic x-ray showed no acute abnormality. Patient was discharged home on 01/27/2016 with home health physical therapy services. The patient has not been able to work with physical therapy due to chronic pain, and there was sent to the ED by her PCP for pain management. In the ED chest x-ray shows bibasilar opacity, patient started on Levaquin for possible pneumonia. She denies chest pain, has been coughing up phlegm. No change in shortness of breath. Patient is on oxygen via nasal cannula 2474 for COPD.    Review of systems:    In addition to the HPI above,  No Fever-chills, No Headache, No changes with Vision or hearing, No problems swallowing food or Liquids, No Chest pain, Cough or Shortness of Breath, No Abdominal pain, No Nausea or Vomiting, bowel movements are regular, No Blood in stool or Urine, No dysuria, No new skin rashes or bruises, No new joints pains-aches,  No recent weight gain or loss, No polyuria, polydypsia or polyphagia, No significant Mental Stressors.  A full 10 point Review of Systems was done, except as stated above, all other Review of Systems were negative.   With Past History of the following :    Past  Medical History:  Diagnosis Date  . AAA (abdominal aortic aneurysm) without rupture ([place]) 01/24/2016   4.3x4.0 cm per CT   . Atrial fibrillation ([place])   . Cerebrovascular disease   . COPD (chronic obstructive pulmonary disease) ([place])   . CVA 10/25/2008   Qualifier: Diagnosis of  By: [name], CNA, Christy    . DVT (deep venous thrombosis) ([place])   . GERD (gastroesophageal reflux disease)   . Hypertension   . Internal carotid artery stenosis 05/13/2012   HIGH GRADE LEFT INTERNAL CAROTID STENOSIS.  S/p  L CEA 05/11/12  . Irregular heart beat   . PAD (peripheral artery disease) (HCC)    lower extremity PAD with bilateral leg stenting  . TIA (transient ischemic attack)       Past Surgical History:  Procedure Laterality Date  . APPENDECTOMY  1956  . CAROTID ANGIOGRAM N/A 05/06/2012   Procedure: CAROTID ANGIOGRAM;  Surgeon: [name], MD;  Location: [facility] CATH LAB;  Service: Cardiovascular;  Laterality: N/A;  . CAROTID ENDARTERECTOMY Left Nov. 4, 2013   CE  .  CHOLECYSTECTOMY  1966   Gall Bladder  . COLONOSCOPY N/A 07/12/2014   [id number] cecal polyp/one colon polyp/redundant left colon  . ENDARTERECTOMY  05/11/2012   Procedure: ENDARTERECTOMY CAROTID;  Surgeon: [name], MD;  Location: [facility] OR;  Service: Vascular;  Laterality: Left;  . ESOPHAGOGASTRODUODENOSCOPY N/A 07/12/2014   [id number] in the lower thrid of the esophagus  . EYE SURGERY Left Dec. 22, 2016   Cataract  . GALLBLADDER SURGERY    . MALONEY DILATION N/A 07/12/2014   Procedure: MALONEY DILATION;  Surgeon: [name], MD;  Location[facility];  Service: Endoscopy;  Laterality: N/A;  . PARTIAL HYSTERECTOMY    . PATCH ANGIOPLASTY  05/11/2012   Procedure: PATCH ANGIOPLASTY;  Surgeon: [name], MD;  Location: [facility] OR;  Service: Vascular;  Laterality: Left;  with Dacron Patch Angioplasty  . SAVORY DILATION N/A 07/12/2014   Procedure: SAVORY DILATION;  Surgeon: [name], MD;  Location: [facility];  Service:  Endoscopy;  Laterality: N/A;      Social History:     Social History  Substance Use Topics  . Smoking status: Former Smoker    Packs/day: 1.00    Quit date: 02/23/2014  . Smokeless tobacco: Former Systems developer  . Alcohol use No        Family History :     Family History  Problem Relation Age of Onset  . Deep vein thrombosis Sister     Blood Clot in leg  . Alzheimer's disease Brother   . COPD Sister   . COPD Sister       Home Medications:   Prior to Admission medications   Medication Sig Start Date End Date Taking? Authorizing Provider  cilostazol (PLETAL) 100 MG tablet Take 0.5 tablets (50 mg total) by mouth 2 (two) times daily. 01/27/16  Yes Rexene Alberts, MD  dabigatran (PRADAXA) 150 MG CAPS capsule Take 150 mg by mouth 2 (two) times daily.   Yes Historical Provider, MD  hydrALAZINE (APRESOLINE) 10 MG tablet Take 1 tablet (10 mg total) by mouth 2 (two) times daily. New medication to help with treatment of your high blood pressure. 01/27/16  Yes Rexene Alberts, MD  levalbuterol Penne Lash) 0.63 MG/3ML nebulizer solution Take 3 mLs (0.63 mg total) by nebulization every 4 (four) hours as needed for wheezing or shortness of breath. 01/27/16  Yes Rexene Alberts, MD  lidocaine (LMX) 4 % cream Apply 1 application topically daily as needed (for pain (hips/back)).    Yes Historical Provider, MD  Multiple Vitamin (MULTIVITAMIN WITH MINERALS) TABS tablet Take 1 tablet by mouth daily. 01/27/16  Yes Rexene Alberts, MD  pantoprazole (PROTONIX) 40 MG tablet Take 1 tablet (40 mg total) by mouth daily. 01/27/16  Yes Rexene Alberts, MD  tiotropium (SPIRIVA) 18 MCG inhalation capsule Place 18 mcg into inhaler and inhale at bedtime.   Yes Historical Provider, MD  verapamil (VERELAN PM) 240 MG 24 hr capsule Take 240 mg by mouth daily.   Yes Historical Provider, MD  predniSONE (DELTASONE) 10 MG tablet Take 1 tablet daily for 3 days and then a half a tablet daily for 4 days and then stop. Patient not taking:  Reported on 02/06/2016 01/27/16   Rexene Alberts, MD  senna-docusate (SENOKOT-S) 8.6-50 MG tablet Take 2 tablets by mouth at bedtime. For constipation Patient not taking: Reported on 02/06/2016 01/27/16   Rexene Alberts, MD  traMADol-acetaminophen (ULTRACET) 37.5-325 MG tablet Take 1 tablet by mouth every 6 (six) hours as needed for  moderate pain. Patient not taking: Reported on 02/06/2016 01/27/16   Rexene Alberts, MD     Allergies:     Allergies  Allergen Reactions  . Ibuprofen     Gi upset  . Pnu-Imune [Pneumococcal Polysaccharide Vaccine]     Huge knot at site and rash  . Advair Diskus [Fluticasone-Salmeterol]     Confusion and shortness of breath  . Albuterol     Confusion and shortness of breath  . Amoxicillin Hives  . Cephalexin Hives  . Codeine     confusion  . Entex Pac [Pseudoephedrine-Gg & Dm] Other (See Comments)    Upper respiratory.   . Etanercept     vertigo  . Gualenic Acid Other (See Comments)    unknown  . Lorazepam     hallucinations  . Nubain [Nalbuphine Hcl]   . Oxycodone Hcl Nausea And Vomiting  . Oxycodone-Acetaminophen Nausea And Vomiting  . Roxicodone [Oxycodone Hcl]   . Tramadol     hallucinations   . Tylox [Oxycodone-Acetaminophen]      Physical Exam:   Vitals  Blood pressure 108/77, pulse 107, temperature 97.6 F (36.4 C), temperature source Oral, resp. rate 18, SpO2 99 %.   1. General Caucasian female lying in bed in NAD, cooperative* with exam  2. Normal affect and insight, Awake Alert, Oriented X 3.  3. No F.N deficits, ALL C.Nerves Intact, Strength 5/5 all 4 extremities, Sensation intact all 4 extremities, Plantars down going.  4. Ears and Eyes appear Normal, Conjunctivae clear, PERRLA. Moist Oral Mucosa.  5. Supple Neck, No JVD, No cervical lymphadenopathy appriciated, No Carotid Bruits.  6. Symmetrical Chest wall movement, Decreased breath sounds bilaterally, scattered wheezing  7. RRR, No Gallops, Rubs or Murmurs, No Parasternal  Heave.No Leg edema  8. Positive Bowel Sounds, Abdomen Soft, No tenderness, No organomegaly appriciated,No rebound -guarding or rigidity.  9.  No Cyanosis, Normal Skin Turgor, No Skin Rash or Bruise.  10. Good muscle tone,  joints appear normal , no effusions, Normal ROM. Straight leg raising negative in both lower extremities. Tenderness at the lumbosacral region elicited.      Data Review:    CBC  Recent Labs Lab 02/06/16 2020  WBC 10.1  HGB 10.9*  HCT 33.5*  PLT 285  MCV 84.0  MCH 27.3  MCHC 32.5  RDW 17.0*  LYMPHSABS 1.2  MONOABS 0.7  EOSABS 0.1  BASOSABS 0.0   ------------------------------------------------------------------------------------------------------------------  Chemistries   Recent Labs Lab 02/06/16 2020  NA 142  K 3.4*  CL 102  CO2 32  GLUCOSE 121*  BUN 21*  CREATININE 0.51  CALCIUM 8.7*  AST 19  ALT 19  ALKPHOS 124  BILITOT 0.6   ------------------------------------------------------------------------------------------------------------------  ------------------------------------------------------------------------------------------------------------------ GFR: Estimated Creatinine Clearance: 38.3 mL/min (by C-G formula based on SCr of 0.8 mg/dL). Liver Function Tests:  Recent Labs Lab 02/06/16 2020  AST 19  ALT 19  ALKPHOS 124  BILITOT 0.6  PROT 5.8*  ALBUMIN 3.2*    --------------------------------------------------------------------------------------------------------------- Urine analysis:    Component Value Date/Time   COLORURINE YELLOW 02/06/2016 1945   APPEARANCEUR CLEAR 02/06/2016 1945   LABSPEC 1.025 02/06/2016 1945   PHURINE 5.5 02/06/2016 1945   GLUCOSEU NEGATIVE 02/06/2016 1945   HGBUR NEGATIVE 02/06/2016 1945   BILIRUBINUR NEGATIVE 02/06/2016 1945   KETONESUR NEGATIVE 02/06/2016 1945   PROTEINUR TRACE (A) 02/06/2016 1945   UROBILINOGEN 2.0 (H) 06/30/2014 1244   NITRITE NEGATIVE 02/06/2016 1945    LEUKOCYTESUR NEGATIVE 02/06/2016 1945      ----------------------------------------------------------------------------------------------------------------  Imaging Results:    Dg Chest Portable 1 View  Result Date: 02/06/2016 CLINICAL DATA:  81 year old female with increasing cough and pain. Initial encounter. EXAM: PORTABLE CHEST 1 VIEW COMPARISON:  11/08/2014 and earlier. FINDINGS: Portable AP semi upright view at 1951 hours. Chronic pulmonary hyperinflation. Patchy increased bibasilar opacity slightly greater on the right. No pneumothorax or pulmonary edema. No definite pleural effusion. Stable cardiac size and mediastinal contours. Ir calcified aortic atherosclerosis. Visualized tracheal air column is within normal limits. IMPRESSION: 1. chronic hyperinflation with patchy acute bibasilar opacity. Favor acute infectious exacerbation. No definite pleural effusion. 2. Calcified aortic atherosclerosis. Electronically Signed   By: Genevie Ann M.D.   On: 02/06/2016 20:12       Assessment & Plan:    Active Problems:   FIBRILLATION, ATRIAL   COPD (chronic obstructive pulmonary disease) (HCC)   Fall at home   Pneumonia   CAP (community acquired pneumonia)   1. Community-acquired pneumonia- admit the patient, start pneumonia protocol. Follow blood cultures, urinary strep pneumo antigen, sputum culture 2. Low back pain- uncontrolled, will start Vicodin when necessary, Flexeril 5 mg 3 times a day. Will obtain physical therapy evaluation, to see if patient needs to go to rehabilitation. 3. COPD exacerbation- patient has noticed, requesting coughing over the past 2 days. Will start Solu-Medrol 60 mg IV every 6 hours, DuoNeb nebulizers every 6 hours when necessary. 4. Atrial fibrillation- CHADS2Vasc score is 7, continue verapamil, Pradaxa for anticoagulation. 5. Peripheral vascular disease- stable, continue Pletal.    DVT Prophylaxis-   Pradaxa  AM Labs Ordered, also please review Full  Orders  Family Communication: Admission, patients condition and plan of care including tests being ordered have been discussed with the patient and her daughters* who indicate understanding and agree with the plan and Code Status.  Code Status: Full code  Admission status: Observation  Time spent in minutes : 60 min   Lloyde Ludlam S M.D on 02/06/2016 at 10:04 PM  Between 7am to 7pm - Pager - [phone]. After 7pm go to [email or web address] - password [facility]  [facility] - Office  [phone]

## 2016-02-06 NOTE — ED Triage Notes (Signed)
Pt seen on 01/24/16 for fall and has had increased pain in back and both legs. Both hips hurt, left worse. States she cannot get up

## 2016-02-06 NOTE — ED Notes (Signed)
Lab at bedside for blood cultures and lactic acid.  °

## 2016-02-06 NOTE — ED Notes (Signed)
Pt and family report that pt was discharged from hospital two weeks ago, PT has been coming out to pt's house but pt refusing to participate, pt states " that does not work"  family reports that pt has had a cough that has increased over the past day or so.

## 2016-02-06 NOTE — ED Notes (Signed)
Pt to be transported to floor when xrays are complete,

## 2016-02-06 NOTE — ED Notes (Signed)
MD at bedside. 

## 2016-02-06 NOTE — ED Triage Notes (Signed)
Pt seen 01/24/15 for fall. Reports back pain has increased and worsening . Both hips hurt , esp left. Unable to get up

## 2016-02-06 NOTE — ED Provider Notes (Signed)
[place] DEPT Provider Note   CSN: [id number] Arrival date & time: 02/06/16  [id number]  First Provider Contact:  First MD Initiated Contact with Patient 02/06/16 1855        History   Chief Complaint Chief Complaint  Patient presents with  . Back Pain    HPI Caroline [name] is a 81 y.o. female.  Patient complains of back pain and cough   The history is provided by the patient. No language interpreter was used.  Back Pain   This is a new problem. The problem occurs constantly. The problem has not changed since onset.The pain is associated with no known injury. The pain is present in the lumbar spine. The quality of the pain is described as aching. The pain does not radiate. The pain is at a severity of 7/10. The pain is moderate. The symptoms are aggravated by bending. Pertinent negatives include no chest pain, no headaches and no abdominal pain.    Past Medical History:  Diagnosis Date  . AAA (abdominal aortic aneurysm) without rupture (Caroline [name]) 01/24/2016   4.3x4.0 cm per CT   . Atrial fibrillation (Caroline [name])   . Cerebrovascular disease   . COPD (chronic obstructive pulmonary disease) (Caroline [name])   . CVA 10/25/2008   Qualifier: Diagnosis of  By: Caroline Cliche, CNA, Christy    . DVT (deep venous thrombosis) ([place])   . GERD (gastroesophageal reflux disease)   . Hypertension   . Internal carotid artery stenosis 05/13/2012   HIGH GRADE LEFT INTERNAL CAROTID STENOSIS.  S/p  L CEA 05/11/12  . Irregular heart beat   . PAD (peripheral artery disease) (H[facility])    lower extremity PAD with bilateral leg stenting  . TIA (transient ischemic attack)     Patient Active Problem List   Diagnosis Date Noted  . Pneumonia 02/06/2016  . Hyponatremia 01/27/2016  . Fall   . Malnutrition of moderate degree 01/25/2016  . Hyperkalemia 01/25/2016  . Hematochezia 01/25/2016  . Urinary retention 01/25/2016  . Fall at home 01/24/2016  . Inability to ambulate due to multiple joints 01/24/2016  . Facet arthropathy of  spine 01/24/2016  . Chronic respiratory failure with hypoxia (Caroline [name]) 01/24/2016  . COPD with emphysema (Caroline [name]) 01/24/2016  . Low back pain at multiple sites 01/24/2016  . Chronic atrial fibrillation (Caroline [name]) 01/24/2016  . AAA (abdominal aortic aneurysm) without rupture (Caroline [name]) 01/24/2016  . PVD (peripheral vascular disease) (Caroline [name]) 01/24/2016  . Constipation 01/24/2016  . Colon adenomas 11/28/2014  . Rectal bleeding 06/30/2014  . Acute respiratory failure with hypoxia (Caroline [name]) 04/14/2014  . Pulmonary edema 04/14/2014  . Acute exacerbation of chronic obstructive pulmonary disease (COPD) ([name]) 04/14/2014  . Protein-calorie malnutrition, severe (Caroline [name]) 04/14/2014  . Aftercare following surgery of the circulatory system, [place] 06/24/2013  . Hypotension 05/13/2012  . Internal carotid artery stenosis 05/13/2012  . TIA (transient ischemic attack) 05/05/2012  . Hemiballismus 05/03/2012  . Stroke ([name]) 05/02/2012  . COPD (chronic obstructive pulmonary disease) (Caroline [name]) 05/02/2012  . Hypokalemia 07/10/2011  . Peripheral vascular disease (Caroline [name]) 05/17/2010  . TOBACCO ABUSE 05/16/2010  . Carotid stenosis 11/06/2009  . FIBRILLATION, ATRIAL 10/25/2008  . CVA 10/25/2008  . LOW BACK PAIN, CHRONIC 10/25/2008    Past Surgical History:  Procedure Laterality Date  . APPENDECTOMY  1956  . CAROTID ANGIOGRAM N/A 05/06/2012   Procedure: CAROTID ANGIOGRAM;  Surgeon: [name], MD;  Location: [facility] CATH LAB;  Service: Cardiovascular;  Laterality: N/A;  . CAROTID ENDARTERECTOMY Left Nov. 4, 2013   CE  .  CHOLECYSTECTOMY  1966   Gall Bladder  . COLONOSCOPY N/A 07/12/2014   [id number] cecal polyp/one colon polyp/redundant left colon  . ENDARTERECTOMY  05/11/2012   Procedure: ENDARTERECTOMY CAROTID;  Surgeon: [name], MD;  Location: [facility] OR;  Service: Vascular;  Laterality: Left;  . ESOPHAGOGASTRODUODENOSCOPY N/A 07/12/2014   [id number] in the lower thrid of the esophagus  . EYE SURGERY Left Dec. 22, 2016   Cataract   . GALLBLADDER SURGERY    . MALONEY DILATION N/A 07/12/2014   Procedure: MALONEY DILATION;  Surgeon: [name], MD;  Location: [facility] ENDO SUITE;  Service: Endoscopy;  Laterality: N/A;  . PARTIAL HYSTERECTOMY    . PATCH ANGIOPLASTY  05/11/2012   Procedure: PATCH ANGIOPLASTY;  Surgeon: [name], MD;  [facility];  Service: Vascular;  Laterality: Left;  with Dacron Patch Angioplasty  . SAVORY DILATION N/A 07/12/2014   Procedure: SAVORY DILATION;  Surgeon: [name], MD;  Location: [facility] ENDO SUITE;  Service: Endoscopy;  Laterality: N/A;    OB History    No data available       Home Medications    Prior to Admission medications   Medication Sig Start Date End Date Taking? Authorizing Provider  lidocaine (LMX) 4 % cream Apply 1 application topically as needed.   Yes Historical Provider, MD  cilostazol (PLETAL) 100 MG tablet Take 0.5 tablets (50 mg total) by mouth 2 (two) times daily. 01/27/16   Rexene Alberts, MD  dabigatran (PRADAXA) 150 MG CAPS capsule Take 150 mg by mouth 2 (two) times daily.    Historical Provider, MD  hydrALAZINE (APRESOLINE) 10 MG tablet Take 1 tablet (10 mg total) by mouth 2 (two) times daily. New medication to help with treatment of your high blood pressure. 01/27/16   Rexene Alberts, MD  levalbuterol Penne Lash) 0.63 MG/3ML nebulizer solution Take 3 mLs (0.63 mg total) by nebulization every 4 (four) hours as needed for wheezing or shortness of breath. 01/27/16   Rexene Alberts, MD  Multiple Vitamin (MULTIVITAMIN WITH MINERALS) TABS tablet Take 1 tablet by mouth daily. 01/27/16   Rexene Alberts, MD  pantoprazole (PROTONIX) 40 MG tablet Take 1 tablet (40 mg total) by mouth daily. 01/27/16   Rexene Alberts, MD  potassium chloride SA (K-DUR,KLOR-CON) 20 MEQ tablet Take 20 mEq by mouth daily.    Historical Provider, MD  predniSONE (DELTASONE) 10 MG tablet Take 1 tablet daily for 3 days and then a half a tablet daily for 4 days and then stop. 01/27/16   Rexene Alberts, MD    senna-docusate (SENOKOT-S) 8.6-50 MG tablet Take 2 tablets by mouth at bedtime. For constipation 01/27/16   Rexene Alberts, MD  tiotropium (SPIRIVA) 18 MCG inhalation capsule Place 18 mcg into inhaler and inhale at bedtime.    Historical Provider, MD  traMADol-acetaminophen (ULTRACET) 37.5-325 MG tablet Take 1 tablet by mouth every 6 (six) hours as needed for moderate pain. 01/27/16   Rexene Alberts, MD  verapamil (CALAN-SR) 240 MG CR tablet Take 240 mg by mouth daily. Takes at lunch time    Historical Provider, MD    Family History Family History  Problem Relation Age of Onset  . Deep vein thrombosis Sister     Blood Clot in leg  . Alzheimer's disease Brother   . COPD Sister   . COPD Sister     Social History Social History  Substance Use Topics  . Smoking status: Former Smoker    Packs/day: 1.00    Quit date:  02/23/2014  . Smokeless tobacco: Former Systems developer  . Alcohol use No     Allergies   Ibuprofen; Pnu-imune [pneumococcal polysaccharide vaccine]; Advair diskus [fluticasone-salmeterol]; Albuterol; Amoxicillin; Cephalexin; Codeine; Entex pac [pseudoephedrine-gg & dm]; Etanercept; Gualenic acid; Lorazepam; Nubain [nalbuphine hcl]; Oxycodone hcl; Oxycodone-acetaminophen; Roxicodone [oxycodone hcl]; Tramadol; and Tylox [oxycodone-acetaminophen]   Review of Systems Review of Systems  Constitutional: Negative for appetite change and fatigue.  HENT: Negative for congestion, ear discharge and sinus pressure.   Eyes: Negative for discharge.  Cardiovascular: Negative for chest pain.  Gastrointestinal: Negative for abdominal pain and diarrhea.  Genitourinary: Negative for frequency and hematuria.  Musculoskeletal: Positive for back pain.  Skin: Negative for rash.  Neurological: Negative for seizures and headaches.  Psychiatric/Behavioral: Negative for hallucinations.     Physical Exam Updated Vital Signs BP 128/64   Pulse 90   Temp 97.8 F (36.6 C)   Resp 18   SpO2 100%    Physical Exam  Constitutional: She is oriented to person, place, and time. She appears well-developed.  HENT:  Head: Normocephalic.  Eyes: Conjunctivae and EOM are normal. No scleral icterus.  Neck: Neck supple. No thyromegaly present.  Cardiovascular: Normal rate and regular rhythm.  Exam reveals no gallop and no friction rub.   No murmur heard. Pulmonary/Chest: No stridor. She has no wheezes. She has no rales. She exhibits no tenderness.  Abdominal: She exhibits no distension. There is no tenderness. There is no rebound.  Musculoskeletal: Normal range of motion. She exhibits no edema.  Tender lumbar spine  Lymphadenopathy:    She has no cervical adenopathy.  Neurological: She is oriented to person, place, and time. She exhibits normal muscle tone. Coordination normal.  Skin: No rash noted. No erythema.  Psychiatric: She has a normal mood and affect. Her behavior is normal.     ED Treatments / Results  Labs (all labs ordered are listed, but only abnormal results are displayed) Labs Reviewed  CBC WITH DIFFERENTIAL/PLATELET - Abnormal; Notable for the following:       Result Value   Hemoglobin 10.9 (*)    HCT 33.5 (*)    RDW 17.0 (*)    Neutro Abs 8.0 (*)    All other components within normal limits  COMPREHENSIVE METABOLIC PANEL - Abnormal; Notable for the following:    Potassium 3.4 (*)    Glucose, Bld 121 (*)    BUN 21 (*)    Calcium 8.7 (*)    Total Protein 5.8 (*)    Albumin 3.2 (*)    All other components within normal limits  URINALYSIS, ROUTINE W REFLEX MICROSCOPIC (NOT AT [facility]) - Abnormal; Notable for the following:    Protein, ur TRACE (*)    All other components within normal limits  URINE MICROSCOPIC-ADD ON - Abnormal; Notable for the following:    Squamous Epithelial / LPF 0-5 (*)    Bacteria, UA MANY (*)    All other components within normal limits  CULTURE, BLOOD (ROUTINE X 2)  CULTURE, BLOOD (ROUTINE X 2)  URINE CULTURE  LACTIC ACID, PLASMA     EKG  EKG Interpretation None       Radiology Dg Chest Portable 1 View  Result Date: 02/06/2016 CLINICAL DATA:  81 year old female with increasing cough and pain. Initial encounter. EXAM: PORTABLE CHEST 1 VIEW COMPARISON:  11/08/2014 and earlier. FINDINGS: Portable AP semi upright view at 1951 hours. Chronic pulmonary hyperinflation. Patchy increased bibasilar opacity slightly greater on the right. No pneumothorax or pulmonary edema. No  definite pleural effusion. Stable cardiac size and mediastinal contours. Ir calcified aortic atherosclerosis. Visualized tracheal air column is within normal limits. IMPRESSION: 1. chronic hyperinflation with patchy acute bibasilar opacity. Favor acute infectious exacerbation. No definite pleural effusion. 2. Calcified aortic atherosclerosis. Electronically Signed   By: Genevie Ann M.D.   On: 02/06/2016 20:12    Procedures Procedures (including critical care time)  Medications Ordered in ED Medications  levofloxacin (LEVAQUIN) IVPB 500 mg (500 mg Intravenous New Bag/Given 02/06/16 2112)  fentaNYL (SUBLIMAZE) injection 25 mcg (25 mcg Intravenous Given 02/06/16 2029)     Initial Impression / Assessment and Plan / ED Course  I have reviewed the triage vital signs and the nursing notes.  Pertinent labs & imaging results that were available during my care of the patient were reviewed by me and considered in my medical decision making (see chart for details).  Clinical Course    Patient with cough and back pain. Patient has pneumonia and urinary tract infection. Patient will be admitted for antibiotics  Final Clinical Impressions(s) / ED Diagnoses   Final diagnoses:  Community acquired pneumonia    New Prescriptions New Prescriptions   No medications on file     Milton Ferguson, MD 02/06/16 2126

## 2016-02-06 NOTE — ED Notes (Signed)
Patient transported to X-ray 

## 2016-02-07 DIAGNOSIS — K219 Gastro-esophageal reflux disease without esophagitis: Secondary | ICD-10-CM | POA: Diagnosis present

## 2016-02-07 DIAGNOSIS — M1288 Other specific arthropathies, not elsewhere classified, other specified site: Secondary | ICD-10-CM | POA: Diagnosis not present

## 2016-02-07 DIAGNOSIS — Z87891 Personal history of nicotine dependence: Secondary | ICD-10-CM | POA: Diagnosis not present

## 2016-02-07 DIAGNOSIS — I959 Hypotension, unspecified: Secondary | ICD-10-CM | POA: Diagnosis not present

## 2016-02-07 DIAGNOSIS — K921 Melena: Secondary | ICD-10-CM | POA: Diagnosis not present

## 2016-02-07 DIAGNOSIS — Z820 Family history of epilepsy and other diseases of the nervous system: Secondary | ICD-10-CM | POA: Diagnosis not present

## 2016-02-07 DIAGNOSIS — Z8673 Personal history of transient ischemic attack (TIA), and cerebral infarction without residual deficits: Secondary | ICD-10-CM | POA: Diagnosis not present

## 2016-02-07 DIAGNOSIS — Z7901 Long term (current) use of anticoagulants: Secondary | ICD-10-CM | POA: Diagnosis not present

## 2016-02-07 DIAGNOSIS — I714 Abdominal aortic aneurysm, without rupture: Secondary | ICD-10-CM | POA: Diagnosis not present

## 2016-02-07 DIAGNOSIS — J449 Chronic obstructive pulmonary disease, unspecified: Secondary | ICD-10-CM | POA: Diagnosis not present

## 2016-02-07 DIAGNOSIS — W19XXXA Unspecified fall, initial encounter: Secondary | ICD-10-CM

## 2016-02-07 DIAGNOSIS — G255 Other chorea: Secondary | ICD-10-CM | POA: Diagnosis not present

## 2016-02-07 DIAGNOSIS — E43 Unspecified severe protein-calorie malnutrition: Secondary | ICD-10-CM | POA: Diagnosis not present

## 2016-02-07 DIAGNOSIS — E876 Hypokalemia: Secondary | ICD-10-CM | POA: Diagnosis not present

## 2016-02-07 DIAGNOSIS — J438 Other emphysema: Secondary | ICD-10-CM

## 2016-02-07 DIAGNOSIS — J9611 Chronic respiratory failure with hypoxia: Secondary | ICD-10-CM | POA: Diagnosis present

## 2016-02-07 DIAGNOSIS — R05 Cough: Secondary | ICD-10-CM | POA: Diagnosis not present

## 2016-02-07 DIAGNOSIS — Y92009 Unspecified place in unspecified non-institutional (private) residence as the place of occurrence of the external cause: Secondary | ICD-10-CM | POA: Diagnosis not present

## 2016-02-07 DIAGNOSIS — Y92099 Unspecified place in other non-institutional residence as the place of occurrence of the external cause: Secondary | ICD-10-CM

## 2016-02-07 DIAGNOSIS — R339 Retention of urine, unspecified: Secondary | ICD-10-CM | POA: Diagnosis not present

## 2016-02-07 DIAGNOSIS — J441 Chronic obstructive pulmonary disease with (acute) exacerbation: Secondary | ICD-10-CM | POA: Diagnosis present

## 2016-02-07 DIAGNOSIS — G8929 Other chronic pain: Secondary | ICD-10-CM | POA: Diagnosis present

## 2016-02-07 DIAGNOSIS — M6281 Muscle weakness (generalized): Secondary | ICD-10-CM | POA: Diagnosis not present

## 2016-02-07 DIAGNOSIS — I482 Chronic atrial fibrillation: Secondary | ICD-10-CM | POA: Diagnosis not present

## 2016-02-07 DIAGNOSIS — R293 Abnormal posture: Secondary | ICD-10-CM | POA: Diagnosis not present

## 2016-02-07 DIAGNOSIS — K625 Hemorrhage of anus and rectum: Secondary | ICD-10-CM | POA: Diagnosis not present

## 2016-02-07 DIAGNOSIS — I2510 Atherosclerotic heart disease of native coronary artery without angina pectoris: Secondary | ICD-10-CM | POA: Diagnosis not present

## 2016-02-07 DIAGNOSIS — Z720 Tobacco use: Secondary | ICD-10-CM | POA: Diagnosis not present

## 2016-02-07 DIAGNOSIS — I4891 Unspecified atrial fibrillation: Secondary | ICD-10-CM | POA: Diagnosis present

## 2016-02-07 DIAGNOSIS — I739 Peripheral vascular disease, unspecified: Secondary | ICD-10-CM | POA: Diagnosis present

## 2016-02-07 DIAGNOSIS — Z9181 History of falling: Secondary | ICD-10-CM | POA: Diagnosis not present

## 2016-02-07 DIAGNOSIS — I639 Cerebral infarction, unspecified: Secondary | ICD-10-CM | POA: Diagnosis not present

## 2016-02-07 DIAGNOSIS — J189 Pneumonia, unspecified organism: Secondary | ICD-10-CM

## 2016-02-07 DIAGNOSIS — J440 Chronic obstructive pulmonary disease with acute lower respiratory infection: Secondary | ICD-10-CM | POA: Diagnosis present

## 2016-02-07 DIAGNOSIS — I10 Essential (primary) hypertension: Secondary | ICD-10-CM | POA: Diagnosis present

## 2016-02-07 DIAGNOSIS — Z825 Family history of asthma and other chronic lower respiratory diseases: Secondary | ICD-10-CM | POA: Diagnosis not present

## 2016-02-07 DIAGNOSIS — I480 Paroxysmal atrial fibrillation: Secondary | ICD-10-CM | POA: Diagnosis not present

## 2016-02-07 DIAGNOSIS — R488 Other symbolic dysfunctions: Secondary | ICD-10-CM | POA: Diagnosis not present

## 2016-02-07 DIAGNOSIS — M545 Low back pain: Secondary | ICD-10-CM | POA: Diagnosis present

## 2016-02-07 DIAGNOSIS — K5900 Constipation, unspecified: Secondary | ICD-10-CM | POA: Diagnosis not present

## 2016-02-07 DIAGNOSIS — R279 Unspecified lack of coordination: Secondary | ICD-10-CM | POA: Diagnosis not present

## 2016-02-07 MED ORDER — DABIGATRAN ETEXILATE MESYLATE 150 MG PO CAPS
ORAL_CAPSULE | ORAL | Status: AC
  Filled 2016-02-07: qty 1

## 2016-02-07 MED ORDER — CILOSTAZOL 100 MG PO TABS
ORAL_TABLET | ORAL | Status: AC
Start: 2016-02-07 — End: 2016-02-07
  Filled 2016-02-07: qty 1

## 2016-02-07 MED ORDER — GUAIFENESIN ER 600 MG PO TB12
600.0000 mg | ORAL_TABLET | Freq: Two times a day (BID) | ORAL | Status: DC
  Administered 2016-02-07 – 2016-02-10 (×7): 600 mg via ORAL
  Filled 2016-02-07 (×7): qty 1

## 2016-02-07 MED ORDER — LIDOCAINE 5 % EX PTCH
1.0000 | MEDICATED_PATCH | CUTANEOUS | Status: DC
  Administered 2016-02-07 – 2016-02-10 (×4): 1 via TRANSDERMAL
  Filled 2016-02-07 (×4): qty 1

## 2016-02-07 MED ORDER — BOOST / RESOURCE BREEZE PO LIQD
1.0000 | Freq: Three times a day (TID) | ORAL | Status: DC
  Administered 2016-02-07 – 2016-02-10 (×8): 1 via ORAL

## 2016-02-07 MED ORDER — ENSURE ENLIVE PO LIQD
237.0000 mL | Freq: Two times a day (BID) | ORAL | Status: DC
  Administered 2016-02-07: 237 mL via ORAL

## 2016-02-07 NOTE — Care Management Note (Signed)
Case Management Note  Patient Details  Name: Caroline [name] MRN: [id number] Date of Birth: [birth year]  Subjective/Objective:                  Pt admitted for PNU/COPD exacerbation. Pt is from home, active with [facility]. Pt has home O2 PTA. Pt lives alone and is cared for by her daughter. Family is interested in SNF placement, pt does not have 3 day inpt stay. Pt's family at bedside and understands need for 3 day inpt stay. Abby, of [facility], is aware of admission and will obtain pt info from chart. Pt's daughter states if she returns home she will need hospital bed, pt would like hospital bed to come from [facility]. Pt has recommended SNF.   Action/Plan: Will cont to follow.   Expected Discharge Date:   02/10/2016               Expected Discharge Plan:  [place]  In-House Referral:  NA  Discharge planning Services  CM Consult  Post Acute Care Choice:  Durable Medical Equipment, Home Health, Resumption of Svcs/PTA Provider Choice offered to:  Patient  DME Arranged:  Hospital bed DME Agency:  [facility]  HH Arranged:  RN, PT, Nurse's Aide [place] Agency:  [facility]  Status of Service:     If discussed at [employer], dates discussed:    Additional Comments:  Sherald Barge, RN 02/07/2016, 2:27 PM

## 2016-02-07 NOTE — Progress Notes (Signed)
Pharmacy Antibiotic Note  Caroline [name] is a 81 y.o. female admitted on 02/06/2016 with pneumonia.  Pharmacy has been consulted for renal dose antibiotics.  Plan: Cont levaquin 750 mg IV q48 hours F/u change to po. F/u cultures and clinical course  Height: 5\' 4"  (162.6 cm) Weight: 89 lb 1.6 oz (40.4 kg) IBW/kg (Calculated) : 54.7  Temp (24hrs), Avg:97.9 F (36.6 C), Min:97.6 F (36.4 C), Max:98.5 F (36.9 C)   Recent Labs Lab 02/06/16 2020 02/06/16 2048  WBC 10.1  --   CREATININE 0.51  --   LATICACIDVEN  --  0.8    Estimated Creatinine Clearance: 36.4 mL/min (by C-G formula based on SCr of 0.8 mg/dL).    Allergies  Allergen Reactions  . Ibuprofen     Gi upset  . Pnu-Imune [Pneumococcal Polysaccharide Vaccine]     Huge knot at site and rash  . Advair Diskus [Fluticasone-Salmeterol]     Confusion and shortness of breath  . Albuterol     Confusion and shortness of breath  . Amoxicillin Hives  . Cephalexin Hives  . Codeine     confusion  . Entex Pac [Pseudoephedrine-Gg & Dm] Other (See Comments)    Upper respiratory.   . Etanercept     vertigo  . Gualenic Acid Other (See Comments)    unknown  . Lorazepam     hallucinations  . Nubain [Nalbuphine Hcl]   . Oxycodone Hcl Nausea And Vomiting  . Oxycodone-Acetaminophen Nausea And Vomiting  . Roxicodone [Oxycodone Hcl]   . Tramadol     hallucinations   . Tylox [Oxycodone-Acetaminophen]     Antimicrobials this admission: levaquin 8/1 >>   Thank you for allowing pharmacy to be a part of this patient's care.  Beverlee Nims 02/07/2016 7:39 AM

## 2016-02-07 NOTE — Evaluation (Signed)
Physical Therapy Evaluation Patient Details Name: Caroline [name] MRN: [id number] DOB: 12-Feb-1935 Today's Date: 02/07/2016   History of Present Illness  81 yo F admitted 8/1 for back pain s/p fall 7/17 (tripped over O2 concentrator) with CT of the lumbar spine and pelvic XR that showed no acute abnormality.  Recent hospitalization 7/19-7/22/2017.  Unable to work with HHPT due to chronic pain.  Now with cough over the past 2 days.  Dx: PNA.   COPD - O2 dependent, Afib on chronic anticoagulation, spinal stenosis, PAD, HTN, CVA, AAA, DVT, TIA, appendectomy, L CEA, gallbladder surgery, patch angioplasty.  Clinical Impression  Pt received in bed, and was agreeable to PT evaluation. Pt continues to be limited due to extreme pain upon sitting up.  She required Max A for supine<>sit, and she was not able to stand due to intense pain.  Pt education on benefits of mobilization, and was given pain relieving techniques, but pt not able to adhere to these during tx.  Due to pt's mobility status she is recommended for SNF, however, if this is not possible she will need to have HHPT, as well as 24/7 supervision/assistance due to mobility needs.     Follow Up Recommendations SNF;Home health PT;Supervision/Assistance - 24 hour    Equipment Recommendations       Recommendations for Other Services       Precautions / Restrictions Precautions Precautions: Fall Precaution Comments: Spinal precuations due to extensive back pain Restrictions Weight Bearing Restrictions: No      Mobility  Bed Mobility Overal bed mobility: Needs Assistance Bed Mobility: Rolling;Sidelying to Sit;Sit to Sidelying Rolling: Min assist Sidelying to sit: Max assist;HOB elevated (bed pad used to assist and support pt's back/trunk during transfer. )     Sit to sidelying: Max assist General bed mobility comments: Pt educated on importance of deep breathing and remaining relaxed during transition into sitting.  Pt education on how  increased muscle tone and contraction increases pain, however pt becomes very anxious and guarded during transfer, crying out in pain.  Pt then assisted back into the bed, and bed transformed into a modified chair position with HOB raised as high as pt would tolerate.    Transfers Overall transfer level:  (NA - pt requires max A for sitting EOB due to pain. )                  Ambulation/Gait                Stairs            Wheelchair Mobility    Modified Rankin (Stroke Patients Only)       Balance                                             Pertinent Vitals/Pain Pain Assessment: 0-10 Pain Score: 10-Worst pain ever Pain Location: back and sacral region Pain Descriptors / Indicators: Sharp Pain Intervention(s): Limited activity within patient's tolerance;Relaxation;Repositioned    Home Living   Living Arrangements: Alone Available Help at Discharge: Family;Available PRN/intermittently Type of Home: House Home Access: Stairs to enter Entrance Stairs-Rails: Can reach both Entrance Stairs-Number of Steps: 5-6 in the front and in the back.  Pt normally goes in the front.  Home Layout: One level Home Equipment: Grab bars - tub/shower;Grab bars - toilet;Shower seat;Cane - single point;Wheelchair - [employer] -  2 wheels;Other (comment) Additional Comments: majorityof the equipment was for her husband who has passed.     Prior Function Level of Independence: Needs assistance   Gait / Transfers Assistance Needed: Prior to fall last month she was ambulatory with a RW.  ADL's / Homemaking Assistance Needed: pt sponge bathes - independent with dressing and bathing        Hand Dominance   Dominant Hand: Right    Extremity/Trunk Assessment   Upper Extremity Assessment: Generalized weakness           Lower Extremity Assessment: Generalized weakness         Communication   Communication: No difficulties  Cognition  Arousal/Alertness: Awake/alert Behavior During Therapy: WFL for tasks assessed/performed Overall Cognitive Status: Within Functional Limits for tasks assessed                      General Comments      Exercises        Assessment/Plan    PT Assessment Patient needs continued PT services  PT Diagnosis Difficulty walking;Generalized weakness;Abnormality of gait;Acute pain   PT Problem List Decreased strength;Decreased range of motion;Decreased activity tolerance;Decreased balance;Decreased mobility;Decreased knowledge of use of DME;Decreased safety awareness;Decreased knowledge of precautions;Cardiopulmonary status limiting activity;Pain  PT Treatment Interventions DME instruction;Gait training;Stair training;Functional mobility training;Therapeutic activities;Therapeutic exercise;Balance training;Patient/family education   PT Goals (Current goals can be found in the Care Plan section) Acute Rehab PT Goals Patient Stated Goal: Pt wants to have less pain PT Goal Formulation: With patient Time For Goal Achievement: 02/14/16 Potential to Achieve Goals: Fair    Frequency Min 5X/week   Barriers to discharge Decreased caregiver support;Inaccessible home environment stairs to enter and she lives alone - husband passed away in 11-14-2022.     Co-evaluation               End of Session Equipment Utilized During Treatment: Oxygen Activity Tolerance: Patient limited by pain Patient left: in bed;with nursing/sitter in room      Functional Assessment Tool Used: [place] "6-clicks"  Functional Limitation: Mobility: Walking and moving around Mobility: Walking and Moving Around Current Status [phone]): At least 80 percent but less than 100 percent impaired, limited or restricted Mobility: Walking and Moving Around Goal Status [phone]): At least 60 percent but less than 80 percent impaired, limited or restricted    Time: 1114-1129 PT Time Calculation (min) (ACUTE  ONLY): 15 min   Charges:   PT Evaluation $PT Eval Moderate Complexity: 1 Procedure     PT G Codes:   PT G-Codes **NOT FOR INPATIENT CLASS** Functional Assessment Tool Used: [employer] "6-clicks"  Functional Limitation: Mobility: Walking and moving around Mobility: Walking and Moving Around Current Status [phone]): At least 80 percent but less than 100 percent impaired, limited or restricted Mobility: Walking and Moving Around Goal Status [phone]): At least 60 percent but less than 80 percent impaired, limited or restricted    Beth Cayle [place], PT, DPT X: [phone]

## 2016-02-07 NOTE — Progress Notes (Addendum)
Nutrition Follow-up    INTERVENTION:  D/c Ensure Enlive BID  Resume Boost Breeze po TID, each supplement provides 250 kcal and 9 grams of protein     NUTRITION DIAGNOSIS:    Increased nutrient needs related to catabolic illness; ongoing since recent discharge and readmission with pneumonia.   GOAL:  Meet pt nutrition needs as able based on how aggressive she desires to be and what is medically appropriate given her continued decline in nutrition status    MONITOR:  Goals of care (full code per MD), po intake, labs and skin assessments    REASON FOR ASSESSMENT: Malnutrition Screen       ASSESSMENT: Patient is a malnourished underweight 81 yo female who has presented with pneumonia. She was recently assessed by colleague who notes a progressive decline in her weight and nutrition status in the setting of increased needs. She is receiving a heart healthy diet but eating 0% per nursing report. It appeared based on her current chart weight that she had lost an additional 2.7 kg since 7/20. A re-weight was obtained which shows her weight currently 42.7 kg which is within 1# of her weight 2 weeks ago. One of her daughters is here and says they have been making sure she eats three times daily and expected that she had gained some weight lately. The  patient affirms good appetite. During my visit she is eating [place] bacon and has an order of oatmeal from the cafeteria. Daughter says she was upset at breakfast and didn't eat then. There's an Ensure Enlive here but she doesn't like those. She will drink the [employer].     Recent Labs Lab 02/06/16 2020  NA 142  K 3.4*  CL 102  CO2 32  BUN 21*  CREATININE 0.51  CALCIUM 8.7*  GLUCOSE 121*    Labs: reviewed   Diet Order:  Diet Heart Room service appropriate? Yes; Fluid consistency: Thin  Skin:   dry and intact   Last BM:   8/1 incontinent   Height:   Ht Readings from Last 1 Encounters:  02/06/16 5\' 4"  (1.626 m)    Weight:    Wt Readings from Last 1 Encounters:  02/06/16 89 lb 1.6 oz (40.4 kg)    Ideal Body Weight:     BMI:  Body mass index is 15.29 kg/m. Underweight  Estimated Nutritional Needs:   Kcal:   1400-1600 (to prevent further weight loss)  Protein:   52-60 gr  Fluid:   1.4 liters daily  EDUCATION NEEDS:   Colman Cater MS,RD,CSG,LDN Office: [phone] Pager: [phone]

## 2016-02-07 NOTE — Progress Notes (Signed)
PROGRESS NOTE  Caroline [name]  [id number] DOB: 10/04/1934 DOA: 02/06/2016 PCP: Glo Herring., MD Outpatient Specialists:  Subjective: Seen with daughter at bedside, still complaining about severe back pain, not letting her ambulate.  Brief Narrative:  Caroline [name]  is a 81 y.o. female, With history of COPD, atrial fibrillation on chronic anticoagulation, spinal stenosis number peripheral arterial disease, hypertension, CVA, AAA who was sent to the hospital by patient's PCP for pain control. Apparently patient fell on 01/22/2016 at home, at that time imaging studies including CT of the lumbar spine, pelvic x-ray showed no acute abnormality. Patient was discharged home on 01/27/2016 with home health physical therapy services. The patient has not been able to work with physical therapy due to chronic pain, and there was sent to the ED by her PCP for pain management. In the ED chest x-ray shows bibasilar opacity, patient started on Levaquin for possible pneumonia. She denies chest pain, has been coughing up phlegm. No change in shortness of breath. Patient is on oxygen via nasal cannula 2474 for COPD.  Assessment & Plan:   Active Problems:   FIBRILLATION, ATRIAL   COPD (chronic obstructive pulmonary disease) (HCC)   Fall at home   Pneumonia   CAP (community acquired pneumonia)   Community-acquired pneumonia- admit the patient, start pneumonia protocol. Follow blood cultures, urinary strep pneumo antigen, sputum culture. Continue supportive management with bronchodilators, mucolytics, antitussives and oxygen as needed.  Low back pain -Uncontrolled, start Vicodin when necessary, Flexeril, steroids and hot/IC pads. -Will obtain physical therapy evaluation, to see if patient needs to go to rehabilitation.  COPD exacerbation- patient has noticed, requesting coughing over the past 2 days. Will start Solu-Medrol 60 mg IV every 6 hours, DuoNeb nebulizers every 6 hours when  necessary.  Atrial fibrillation- CHADS2Vasc score is 7, continue verapamil, Pradaxa for anticoagulation.  Peripheral vascular disease- stable, continue Pletal.    DVT prophylaxis: Pradaxa Code Status: Full Code Family Communication: Plan discussed with the patient's daughter at bedside. Disposition Plan: TBD Diet: Diet Heart Room service appropriate? Yes; Fluid consistency: Thin  Consultants:   None  Procedures:   None  Antimicrobials:   Levaquin   Objective: Vitals:   02/06/16 2200 02/06/16 2324 02/07/16 0500 02/07/16 0914  BP: 108/77 138/88 (!) 145/92 135/68  Pulse: 107 88 (!) 107   Resp: 16 20 18    Temp:  98.5 F (36.9 C) 97.6 F (36.4 C)   TempSrc:  Oral Oral   SpO2: 99% 100% 100%   Weight:  40.4 kg (89 lb 1.6 oz)    Height:  5\' 4"  (1.626 m)      Intake/Output Summary (Last 24 hours) at 02/07/16 1503 Last data filed at 02/07/16 1341  Gross per 24 hour  Intake                3 ml  Output              450 ml  Net             -447 ml   Filed Weights   02/06/16 2324  Weight: 40.4 kg (89 lb 1.6 oz)    Examination: General exam: Appears calm and comfortable  Respiratory system: Clear to auscultation. Respiratory effort normal. Cardiovascular system: S1 & S2 heard, RRR. No JVD, murmurs, rubs, gallops or clicks. No pedal edema. Gastrointestinal system: Abdomen is nondistended, soft and nontender. No organomegaly or masses felt. Normal bowel sounds heard. Central nervous system: Alert and oriented. No focal neurological  deficits. Extremities: Symmetric 5 x 5 power. Skin: No rashes, lesions or ulcers Psychiatry: Judgement and insight appear normal. Mood & affect appropriate.   Data Reviewed: I have personally reviewed following labs and imaging studies  CBC:  Recent Labs Lab 02/06/16 2020  WBC 10.1  NEUTROABS 8.0*  HGB 10.9*  HCT 33.5*  MCV 84.0  PLT [id number]   Basic Metabolic Panel:  Recent Labs Lab 02/06/16 2020  NA 142  K 3.4*  CL 102  CO2  32  GLUCOSE 121*  BUN 21*  CREATININE 0.51  CALCIUM 8.7*   GFR: Estimated Creatinine Clearance: 36.4 mL/min (by C-G formula based on SCr of 0.8 mg/dL). Liver Function Tests:  Recent Labs Lab 02/06/16 2020  AST 19  ALT 19  ALKPHOS 124  BILITOT 0.6  PROT 5.8*  ALBUMIN 3.2*   No results for input(s): LIPASE, AMYLASE in the last 168 hours. No results for input(s): AMMONIA in the last 168 hours. Coagulation Profile: No results for input(s): INR, PROTIME in the last 168 hours. Cardiac Enzymes: No results for input(s): CKTOTAL, CKMB, CKMBINDEX, TROPONINI in the last 168 hours. BNP (last 3 results) No results for input(s): PROBNP in the last 8760 hours. HbA1C: No results for input(s): HGBA1C in the last 72 hours. CBG: No results for input(s): GLUCAP in the last 168 hours. Lipid Profile: No results for input(s): CHOL, HDL, LDLCALC, TRIG, CHOLHDL, LDLDIRECT in the last 72 hours. Thyroid Function Tests: No results for input(s): TSH, T4TOTAL, FREET4, T3FREE, THYROIDAB in the last 72 hours. Anemia Panel: No results for input(s): VITAMINB12, FOLATE, FERRITIN, TIBC, IRON, RETICCTPCT in the last 72 hours. Urine analysis:    Component Value Date/Time   COLORURINE YELLOW 02/06/2016 1945   APPEARANCEUR CLEAR 02/06/2016 1945   LABSPEC 1.025 02/06/2016 1945   PHURINE 5.5 02/06/2016 1945   GLUCOSEU NEGATIVE 02/06/2016 1945   HGBUR NEGATIVE 02/06/2016 1945   BILIRUBINUR NEGATIVE 02/06/2016 1945   KETONESUR NEGATIVE 02/06/2016 1945   PROTEINUR TRACE (A) 02/06/2016 1945   UROBILINOGEN 2.0 (H) 06/30/2014 1244   NITRITE NEGATIVE 02/06/2016 1945   LEUKOCYTESUR NEGATIVE 02/06/2016 1945   Sepsis Labs: @LABRCNTIP (procalcitonin:4,lacticidven:4)  ) Recent Results (from the past 240 hour(s))  Blood culture (routine x 2)     Status: None (Preliminary result)   Collection Time: 02/06/16  8:48 PM  Result Value Ref Range Status   Specimen Description BLOOD LEFT ARM  Final   Special Requests  BOTTLES DRAWN AEROBIC AND ANAEROBIC 6CC  Final   Culture NO GROWTH < 12 HOURS  Final   Report Status PENDING  Incomplete  Blood culture (routine x 2)     Status: None (Preliminary result)   Collection Time: 02/06/16  8:58 PM  Result Value Ref Range Status   Specimen Description BLOOD LEFT ARM  Final   Special Requests BOTTLES DRAWN AEROBIC AND ANAEROBIC 6CC  Final   Culture NO GROWTH < 12 HOURS  Final   Report Status PENDING  Incomplete     Invalid input(s): PROCALCITONIN, LACTICACIDVEN   Radiology Studies: Dg Chest Portable 1 View  Result Date: 02/06/2016 CLINICAL DATA:  81 year old female with increasing cough and pain. Initial encounter. EXAM: PORTABLE CHEST 1 VIEW COMPARISON:  11/08/2014 and earlier. FINDINGS: Portable AP semi upright view at 1951 hours. Chronic pulmonary hyperinflation. Patchy increased bibasilar opacity slightly greater on the right. No pneumothorax or pulmonary edema. No definite pleural effusion. Stable cardiac size and mediastinal contours. Ir calcified aortic atherosclerosis. Visualized tracheal air column is within normal limits.  IMPRESSION: 1. chronic hyperinflation with patchy acute bibasilar opacity. Favor acute infectious exacerbation. No definite pleural effusion. 2. Calcified aortic atherosclerosis. Electronically Signed   By: Genevie Ann M.D.   On: 02/06/2016 20:12   Dg Hips Bilat With Pelvis Min 5 Views  Result Date: 02/06/2016 CLINICAL DATA:  Fall at home 01/24/2016 (2 weeks prior), persistent bilateral hip pain since fall. EXAM: DG HIP (WITH OR WITHOUT PELVIS) 5+V BILAT COMPARISON:  Pelvis radiographs 01/24/2016 FINDINGS: The cortical margins of the bony pelvis and both hips are intact. No acute or healing fracture. Pubic symphysis and sacroiliac joints are congruent. Both femoral heads are well-seated in the respective acetabula. Bony under mineralization again seen. Large stool burden. Atherosclerosis and vascular stents. IMPRESSION: 1. No pelvic fracture. 2.  Osteopenia. Electronically Signed   By: Jeb Levering M.D.   On: 02/06/2016 22:54        Scheduled Meds: . antiseptic oral rinse  7 mL Mouth Rinse BID  . cilostazol  50 mg Oral BID  . cyclobenzaprine  5 mg Oral TID  . dabigatran  150 mg Oral BID  . feeding supplement  1 Container Oral TID BM  . hydrALAZINE  10 mg Oral BID  . [START ON 02/08/2016] levofloxacin (LEVAQUIN) IV  750 mg Intravenous Q48H  . methylPREDNISolone (SOLU-MEDROL) injection  60 mg Intravenous Q6H  . pantoprazole  40 mg Oral Daily  . sodium chloride flush  3 mL Intravenous Q12H  . verapamil  240 mg Oral Daily   Continuous Infusions:    LOS: 1 day    Time spent: 35 minutes    Laury Huizar A, MD [facility] Pager [phone]  If 7PM-7AM, please contact night-coverage [email or web address] Password [email or web address] 02/07/2016, 3:03 PM

## 2016-02-07 NOTE — Care Management CC44 (Signed)
Condition Code 44 Documentation Completed  Patient Details  Name: Caroline [name] MRN: [id number] Date of Birth: June 15, 1935   Condition Code 44 given:  Yes Patient signature on Condition Code 44 notice:  Yes Documentation of 2 MD's agreement:  Yes Code 44 added to claim:  Yes    Sherald Barge, RN 02/07/2016, 12:12 PM

## 2016-02-08 LAB — HIV ANTIBODY (ROUTINE TESTING W REFLEX): HIV Screen 4th Generation wRfx: NONREACTIVE

## 2016-02-08 LAB — URINE CULTURE: CULTURE: NO GROWTH

## 2016-02-08 MED ORDER — POTASSIUM CHLORIDE CRYS ER 20 MEQ PO TBCR
30.0000 meq | EXTENDED_RELEASE_TABLET | Freq: Once | ORAL | Status: AC
  Administered 2016-02-08: 30 meq via ORAL
  Filled 2016-02-08: qty 1

## 2016-02-08 NOTE — Progress Notes (Signed)
PROGRESS NOTE  Caroline SQUARE  [id number] DOB: Jan 01, 1935 DOA: 02/06/2016 PCP: Glo Herring., MD Outpatient Specialists:  Subjective: History complaining about severe pain, she tried to sit at the edge of the bed when I was in the room, she couldn't do it because of severe pain.  Brief Narrative:  Caroline [name]  is a 81 y.o. female, With history of COPD, atrial fibrillation on chronic anticoagulation, spinal stenosis number peripheral arterial disease, hypertension, CVA, AAA who was sent to the hospital by patient's PCP for pain control. Apparently patient fell on 01/22/2016 at home, at that time imaging studies including CT of the lumbar spine, pelvic x-ray showed no acute abnormality. Patient was discharged home on 01/27/2016 with home health physical therapy services. The patient has not been able to work with physical therapy due to chronic pain, and there was sent to the ED by her PCP for pain management. In the ED chest x-ray shows bibasilar opacity, patient started on Levaquin for possible pneumonia. She denies chest pain, has been coughing up phlegm. No change in shortness of breath. Patient is on oxygen via nasal cannula 2474 for COPD.  Assessment & Plan:   Active Problems:   FIBRILLATION, ATRIAL   COPD (chronic obstructive pulmonary disease) (HCC)   Fall at home   Pneumonia   CAP (community acquired pneumonia)   Community-acquired pneumonia- admit the patient, start pneumonia protocol. Follow blood cultures, urinary strep pneumo antigen, sputum culture. Continue supportive management with bronchodilators, mucolytics, antitussives and oxygen as needed.  Low back pain -Uncontrolled, start Vicodin when necessary, Flexeril, steroids, lidocaine patch and hot/cold pads. -PT evaluated the patient recommended skilled nursing facility.  COPD exacerbation- patient has noticed, requesting coughing over the past 2 days. Will start Solu-Medrol 60 mg IV every 6 hours, DuoNeb  nebulizers every 6 hours when necessary.  Atrial fibrillation- CHADS2Vasc score is 7, continue verapamil, Pradaxa for anticoagulation.  Peripheral vascular disease- stable, continue Pletal.    DVT prophylaxis: Pradaxa Code Status: Full Code Family Communication: Plan discussed with the patient's daughter at bedside. Disposition Plan: TBD Diet: Diet Heart Room service appropriate? Yes; Fluid consistency: Thin  Consultants:   None  Procedures:   None  Antimicrobials:   Levaquin   Objective: Vitals:   02/07/16 1539 02/07/16 2014 02/07/16 2306 02/08/16 0439  BP: (!) 100/57 (!) 111/56  120/70  Pulse: 83 74  85  Resp: 20 20  18   Temp: 97.9 F (36.6 C) 98.6 F (37 C)  97.5 F (36.4 C)  TempSrc: Oral Oral  Oral  SpO2: 99% 100% 100% 99%  Weight:      Height:        Intake/Output Summary (Last 24 hours) at 02/08/16 1317 Last data filed at 02/07/16 2206  Gross per 24 hour  Intake              240 ml  Output              400 ml  Net             -160 ml   Filed Weights   02/06/16 2324  Weight: 40.4 kg (89 lb 1.6 oz)    Examination: General exam: Appears calm and comfortable  Respiratory system: Clear to auscultation. Respiratory effort normal. Cardiovascular system: S1 & S2 heard, RRR. No JVD, murmurs, rubs, gallops or clicks. No pedal edema. Gastrointestinal system: Abdomen is nondistended, soft and nontender. No organomegaly or masses felt. Normal bowel sounds heard. Central nervous system: Alert and oriented.  No focal neurological deficits. Extremities: Symmetric 5 x 5 power. Skin: No rashes, lesions or ulcers Psychiatry: Judgement and insight appear normal. Mood & affect appropriate.   Data Reviewed: I have personally reviewed following labs and imaging studies  CBC:  Recent Labs Lab 02/06/16 2020  WBC 10.1  NEUTROABS 8.0*  HGB 10.9*  HCT 33.5*  MCV 84.0  PLT [id number]   Basic Metabolic Panel:  Recent Labs Lab 02/06/16 2020  NA 142  K 3.4*  CL  102  CO2 32  GLUCOSE 121*  BUN 21*  CREATININE 0.51  CALCIUM 8.7*   GFR: Estimated Creatinine Clearance: 36.4 mL/min (by C-G formula based on SCr of 0.8 mg/dL). Liver Function Tests:  Recent Labs Lab 02/06/16 2020  AST 19  ALT 19  ALKPHOS 124  BILITOT 0.6  PROT 5.8*  ALBUMIN 3.2*   No results for input(s): LIPASE, AMYLASE in the last 168 hours. No results for input(s): AMMONIA in the last 168 hours. Coagulation Profile: No results for input(s): INR, PROTIME in the last 168 hours. Cardiac Enzymes: No results for input(s): CKTOTAL, CKMB, CKMBINDEX, TROPONINI in the last 168 hours. BNP (last 3 results) No results for input(s): PROBNP in the last 8760 hours. HbA1C: No results for input(s): HGBA1C in the last 72 hours. CBG: No results for input(s): GLUCAP in the last 168 hours. Lipid Profile: No results for input(s): CHOL, HDL, LDLCALC, TRIG, CHOLHDL, LDLDIRECT in the last 72 hours. Thyroid Function Tests: No results for input(s): TSH, T4TOTAL, FREET4, T3FREE, THYROIDAB in the last 72 hours. Anemia Panel: No results for input(s): VITAMINB12, FOLATE, FERRITIN, TIBC, IRON, RETICCTPCT in the last 72 hours. Urine analysis:    Component Value Date/Time   COLORURINE YELLOW 02/06/2016 1945   APPEARANCEUR CLEAR 02/06/2016 1945   LABSPEC 1.025 02/06/2016 1945   PHURINE 5.5 02/06/2016 1945   GLUCOSEU NEGATIVE 02/06/2016 1945   HGBUR NEGATIVE 02/06/2016 1945   BILIRUBINUR NEGATIVE 02/06/2016 1945   KETONESUR NEGATIVE 02/06/2016 1945   PROTEINUR TRACE (A) 02/06/2016 1945   UROBILINOGEN 2.0 (H) 06/30/2014 1244   NITRITE NEGATIVE 02/06/2016 1945   LEUKOCYTESUR NEGATIVE 02/06/2016 1945   Sepsis Labs: @LABRCNTIP (procalcitonin:4,lacticidven:4)  ) Recent Results (from the past 240 hour(s))  Urine culture     Status: None   Collection Time: 02/06/16  7:45 PM  Result Value Ref Range Status   Specimen Description URINE, CLEAN CATCH  Final   Special Requests NONE  Final    Culture NO GROWTH Performed at [facility]   Final   Report Status 02/08/2016 FINAL  Final  Blood culture (routine x 2)     Status: None (Preliminary result)   Collection Time: 02/06/16  8:48 PM  Result Value Ref Range Status   Specimen Description BLOOD LEFT ARM  Final   Special Requests BOTTLES DRAWN AEROBIC AND ANAEROBIC 6CC  Final   Culture NO GROWTH 2 DAYS  Final   Report Status PENDING  Incomplete  Blood culture (routine x 2)     Status: None (Preliminary result)   Collection Time: 02/06/16  8:58 PM  Result Value Ref Range Status   Specimen Description BLOOD LEFT ARM  Final   Special Requests BOTTLES DRAWN AEROBIC AND ANAEROBIC 6CC  Final   Culture NO GROWTH 2 DAYS  Final   Report Status PENDING  Incomplete     Invalid input(s): PROCALCITONIN, LACTICACIDVEN   Radiology Studies: Dg Chest Portable 1 View  Result Date: 02/06/2016 CLINICAL DATA:  81 year old female with increasing cough and  pain. Initial encounter. EXAM: PORTABLE CHEST 1 VIEW COMPARISON:  11/08/2014 and earlier. FINDINGS: Portable AP semi upright view at 1951 hours. Chronic pulmonary hyperinflation. Patchy increased bibasilar opacity slightly greater on the right. No pneumothorax or pulmonary edema. No definite pleural effusion. Stable cardiac size and mediastinal contours. Ir calcified aortic atherosclerosis. Visualized tracheal air column is within normal limits. IMPRESSION: 1. chronic hyperinflation with patchy acute bibasilar opacity. Favor acute infectious exacerbation. No definite pleural effusion. 2. Calcified aortic atherosclerosis. Electronically Signed   By: Genevie Ann M.D.   On: 02/06/2016 20:12   Dg Hips Bilat With Pelvis Min 5 Views  Result Date: 02/06/2016 CLINICAL DATA:  Fall at home 01/24/2016 (2 weeks prior), persistent bilateral hip pain since fall. EXAM: DG HIP (WITH OR WITHOUT PELVIS) 5+V BILAT COMPARISON:  Pelvis radiographs 01/24/2016 FINDINGS: The cortical margins of the bony pelvis and both  hips are intact. No acute or healing fracture. Pubic symphysis and sacroiliac joints are congruent. Both femoral heads are well-seated in the respective acetabula. Bony under mineralization again seen. Large stool burden. Atherosclerosis and vascular stents. IMPRESSION: 1. No pelvic fracture. 2. Osteopenia. Electronically Signed   By: Jeb Levering M.D.   On: 02/06/2016 22:54        Scheduled Meds: . antiseptic oral rinse  7 mL Mouth Rinse BID  . cilostazol  50 mg Oral BID  . cyclobenzaprine  5 mg Oral TID  . dabigatran  150 mg Oral BID  . feeding supplement  1 Container Oral TID BM  . guaiFENesin  600 mg Oral BID  . hydrALAZINE  10 mg Oral BID  . levofloxacin (LEVAQUIN) IV  750 mg Intravenous Q48H  . lidocaine  1 patch Transdermal Q24H  . methylPREDNISolone (SOLU-MEDROL) injection  60 mg Intravenous Q6H  . pantoprazole  40 mg Oral Daily  . sodium chloride flush  3 mL Intravenous Q12H  . verapamil  240 mg Oral Daily   Continuous Infusions:    LOS: 2 days    Time spent: 35 minutes    Noriel Guthrie A, MD [facility] Pager [phone]  If 7PM-7AM, please contact night-coverage [email or web address] Password [email or web address] 02/08/2016, 1:17 PM

## 2016-02-08 NOTE — NC FL2 (Signed)
[place] MEDICAID FL2 LEVEL OF CARE SCREENING TOOL     IDENTIFICATION  Patient Name: Caroline [name] Birthdate: 04/22/1935 Sex: female Admission Date (Current Location): 02/06/2016  [facility] and [place] Number:  [employer] and Address:  [facility] [address], [place]      Provider Number: [id number]  Attending Physician Name and Address:  Verlee Monte, MD  Relative Name and Phone Number:       Current Level of Care: Hospital Recommended Level of Care: [place] Prior Approval Number:    Date Approved/Denied:   PASRR Number: [id number]  Discharge Plan: SNF    Current Diagnoses: Patient Active Problem List   Diagnosis Date Noted  . Pneumonia 02/06/2016  . CAP (community acquired pneumonia) 02/06/2016  . Hyponatremia 01/27/2016  . Fall   . Malnutrition of moderate degree 01/25/2016  . Hyperkalemia 01/25/2016  . Hematochezia 01/25/2016  . Urinary retention 01/25/2016  . Fall at home 01/24/2016  . Inability to ambulate due to multiple joints 01/24/2016  . Facet arthropathy of spine 01/24/2016  . Chronic respiratory failure with hypoxia ([place]) 01/24/2016  . COPD with emphysema ([place]) 01/24/2016  . Low back pain at multiple sites 01/24/2016  . Chronic atrial fibrillation ([place]) 01/24/2016  . AAA (abdominal aortic aneurysm) without rupture ([place]) 01/24/2016  . PVD (peripheral vascular disease) ([place]) 01/24/2016  . Constipation 01/24/2016  . Colon adenomas 11/28/2014  . Rectal bleeding 06/30/2014  . Acute respiratory failure with hypoxia ([place]) 04/14/2014  . Pulmonary edema 04/14/2014  . Acute exacerbation of chronic obstructive pulmonary disease (COPD) ([place]) 04/14/2014  . Protein-calorie malnutrition, severe ([place]) 04/14/2014  . Aftercare following surgery of the circulatory system, [place] 06/24/2013  . Hypotension 05/13/2012  . Internal carotid artery stenosis 05/13/2012  . TIA (transient ischemic attack) 05/05/2012   . Hemiballismus 05/03/2012  . Stroke ([place]) 05/02/2012  . COPD (chronic obstructive pulmonary disease) ([place]) 05/02/2012  . Hypokalemia 07/10/2011  . Peripheral vascular disease ([place]) 05/17/2010  . TOBACCO ABUSE 05/16/2010  . Carotid stenosis 11/06/2009  . FIBRILLATION, ATRIAL 10/25/2008  . CVA 10/25/2008  . LOW BACK PAIN, CHRONIC 10/25/2008    Orientation RESPIRATION BLADDER Height & Weight     Self, Time, Situation, Place  O2 (3 L) Incontinent Weight: 89 lb 1.6 oz (40.4 kg) Height:  5\' 4"  (162.6 cm)  BEHAVIORAL SYMPTOMS/MOOD NEUROLOGICAL BOWEL NUTRITION STATUS  Other (Comment) (n/a)  (n/a) Incontinent Diet (Heart healthy)  AMBULATORY STATUS COMMUNICATION OF NEEDS Skin   Total Care Verbally Bruising                       Personal Care Assistance Level of Assistance  Bathing, Feeding, Dressing Bathing Assistance: Maximum assistance Feeding assistance: Limited assistance Dressing Assistance: Maximum assistance     Functional Limitations Info  Sight, Hearing, Speech Sight Info: Adequate Hearing Info: Adequate Speech Info: Adequate    SPECIAL CARE FACTORS FREQUENCY  PT (By licensed PT)     PT Frequency: 5              Contractures      Additional Factors Info  Code Status, Allergies Code Status Info: Full code Allergies Info: Ibuprofen, Pnu-immune(Pneumococcal Polysaccharide Vaccine), Advair Diskus (Fluticasone-salmeterol), Albuterol, Amoxicillin, Cephalexin, Codeine, Entex Pac (pseudoephedrine-gg &Dm), Etanercept, Gualenic Acid, Lorazepam, Nubain (Nalbuuphine Hcl), Oxycodone Hcl, Oxycodone-acetaminophen, Roxicodone (Oxycodone Hcl), Tramadol, Tylox (Oxycodone-acetaminophen)           Current Medications (02/08/2016):  This is the current hospital  active medication list Current Facility-Administered Medications  Medication Dose Route Frequency Provider Last Rate Last Dose  . 0.9 %  sodium chloride infusion  250 mL Intravenous PRN Oswald Hillock, MD      .  antiseptic oral rinse (CPC / CETYLPYRIDINIUM CHLORIDE 0.05%) solution 7 mL  7 mL Mouth Rinse BID Oswald Hillock, MD   7 mL at 02/08/16 1000  . cilostazol (PLETAL) tablet 50 mg  50 mg Oral BID Verlee Monte, MD   50 mg at 02/08/16 0845  . cyclobenzaprine (FLEXERIL) tablet 5 mg  5 mg Oral TID Oswald Hillock, MD   5 mg at 02/08/16 1522  . dabigatran (PRADAXA) capsule 150 mg  150 mg Oral BID Oswald Hillock, MD   150 mg at 02/08/16 0847  . feeding supplement (BOOST / RESOURCE BREEZE) liquid 1 Container  1 Container Oral TID BM Verlee Monte, MD   1 Container at 02/08/16 1000  . guaiFENesin (MUCINEX) 12 hr tablet 600 mg  600 mg Oral BID Verlee Monte, MD   600 mg at 02/08/16 0847  . hydrALAZINE (APRESOLINE) tablet 10 mg  10 mg Oral BID Oswald Hillock, MD   10 mg at 02/08/16 0846  . HYDROcodone-acetaminophen (NORCO/VICODIN) 5-325 MG per tablet 1 tablet  1 tablet Oral Q6H PRN Oswald Hillock, MD   1 tablet at 02/08/16 1522  . ipratropium-albuterol (DUONEB) 0.5-2.5 (3) MG/3ML nebulizer solution 3 mL  3 mL Nebulization Q6H PRN Oswald Hillock, MD      . levalbuterol Penne Lash) nebulizer solution 0.63 mg  0.63 mg Nebulization Q6H PRN Oswald Hillock, MD      . levofloxacin (LEVAQUIN) IVPB 750 mg  750 mg Intravenous Q48H Oswald Hillock, MD      . lidocaine (LIDODERM) 5 % 1 patch  1 patch Transdermal Q24H Verlee Monte, MD   1 patch at 02/07/16 1531  . methylPREDNISolone sodium succinate (SOLU-MEDROL) 125 mg/2 mL injection 60 mg  60 mg Intravenous Q6H Oswald Hillock, MD   60 mg at 02/08/16 0911  . pantoprazole (PROTONIX) EC tablet 40 mg  40 mg Oral Daily Oswald Hillock, MD   40 mg at 02/08/16 0847  . sodium chloride flush (NS) 0.9 % injection 3 mL  3 mL Intravenous Q12H Oswald Hillock, MD   3 mL at 02/08/16 0848  . sodium chloride flush (NS) 0.9 % injection 3 mL  3 mL Intravenous PRN Oswald Hillock, MD      . verapamil (CALAN-SR) CR tablet 240 mg  240 mg Oral Daily Oswald Hillock, MD   240 mg at 02/08/16 [id number]     Discharge  Medications: Please see discharge summary for a list of discharge medications.  Relevant Imaging Results:  Relevant Lab Results:   Additional Information SSN: [id number]  Salome Arnt, [place]

## 2016-02-08 NOTE — Progress Notes (Signed)
Physical Therapy Treatment Patient Details Name: Caroline [name] MRN: [id number] DOB: [birth year] Today's Date: 02/08/2016    History of Present Illness 81 yo F admitted 8/1 for back pain s/p fall 7/17 (tripped over O2 concentrator) with CT of the lumbar spine and pelvic XR that showed no acute abnormality.  Recent hospitalization 7/19-7/22/2017.  Unable to work with HHPT due to chronic pain.  Now with cough over the past 2 days.  Dx: PNA.   COPD - O2 dependent, Afib on chronic anticoagulation, spinal stenosis, PAD, HTN, CVA, AAA, DVT, TIA, appendectomy, L CEA, gallbladder surgery, patch angioplasty.    PT Comments    Pt received in bed, dtr present, and pt is agreeable to PT tx.  Again educated pt on focusing on breathing when pain increases.  Pt was able to participate in LE and lumbar exercises with assistance.  She required Max A for supine<>sit, and Min  A +2 for sit<>stand and pivot over to the chair today.  Pt continues to be limited by excruciating pain, but demonstrated improvement with ability to transfer OOB today.  Continue to recommend SNF to progress pt's strength and upright tolerance.   Follow Up Recommendations  SNF     Equipment Recommendations  None recommended by PT    Recommendations for Other Services       Precautions / Restrictions Precautions Precautions: Fall Precaution Comments: Spinal precuations due to extensive back pain Restrictions Weight Bearing Restrictions: No    Mobility  Bed Mobility Overal bed mobility: Needs Assistance Bed Mobility: Supine to Sit   Sidelying to sit: Max assist;HOB elevated (bed pad used to cradle pt's body and assist with providing support with transition. )       General bed mobility comments: Pt continues to have extreme pain, but was able to focus more on her breathing.  Dtr present to encourage.    Transfers Overall transfer level: Needs assistance Equipment used: None Transfers: Sit to/from [employer] Sit to Stand: Mod assist;+2 physical assistance Stand pivot transfers: Min assist;+2 physical assistance       General transfer comment: Pt able to take a few steps to transfer bed<>Chair.  Dtr assisting with transfer and encouragement throughout tx.   Ambulation/Gait Ambulation/Gait assistance:  (NA)               Stairs            Wheelchair Mobility    Modified Rankin (Stroke Patients Only)       Balance Overall balance assessment: Needs assistance   Sitting balance-Leahy Scale: Fair Sitting balance - Comments: Pt writhing in pain upon sitting therefore, uncertain of true sitting balance.    Standing balance support: Bilateral upper extremity supported Standing balance-Leahy Scale: Fair                      Cognition Arousal/Alertness: Awake/alert Behavior During Therapy: WFL for tasks assessed/performed Overall Cognitive Status: Within Functional Limits for tasks assessed                      Exercises Other Exercises Other Exercises: DKTC x 10 reps - pt able to complete first 5, but then required assistance fo the last 5.  C/o increased pain in L hip Other Exercises: supine lumbar rotation in hooklying x 5 reps each with breathing encouraged. Other Exercises: L hip/knee passive flexion/extension with cues for breathing to assist with reduction in muscle guarding.     General  Comments        Pertinent Vitals/Pain Pain Assessment: 0-10 Pain Score: 10-Worst pain ever Pain Location: back and sacral area and L hip Pain Descriptors / Indicators: Sharp Pain Intervention(s): Relaxation;Limited activity within patient's tolerance;Patient requesting pain meds-RN notified;Premedicated before session    Home Living                      Prior Function            PT Goals (current goals can now be found in the care plan section) Acute Rehab PT Goals Patient Stated Goal: Pt wants to have less pain PT Goal Formulation:  With patient Potential to Achieve Goals: Fair Progress towards PT goals: Progressing toward goals    Frequency  Min 5X/week    PT Plan Current plan remains appropriate    Co-evaluation             End of Session Equipment Utilized During Treatment: Oxygen Activity Tolerance: Patient limited by pain Patient left: in chair;with call bell/phone within reach;with family/visitor present     Time: [id number] PT Time Calculation (min) (ACUTE ONLY): 25 min  Charges:  $Therapeutic Exercise: 8-22 mins $Therapeutic Activity: 8-22 mins                    G Codes:      Beth Tinzley Dalia, PT, DPT X: [phone]

## 2016-02-08 NOTE — Progress Notes (Signed)
Patient still in great pain this morning. PRN pain pill and flexeril given.  Heat packs applied under back and hips

## 2016-02-09 LAB — BASIC METABOLIC PANEL
Anion gap: 5 (ref 5–15)
BUN: 40 mg/dL — AB (ref 6–20)
CHLORIDE: 99 mmol/L — AB (ref 101–111)
CO2: 31 mmol/L (ref 22–32)
CREATININE: 0.47 mg/dL (ref 0.44–1.00)
Calcium: 8.3 mg/dL — ABNORMAL LOW (ref 8.9–10.3)
GFR calc Af Amer: >60 mL/min (ref >60)
GFR calc non Af Amer: >60 mL/min (ref >60)
GLUCOSE: 152 mg/dL — AB (ref 65–99)
Potassium: 5.2 mmol/L — ABNORMAL HIGH (ref 3.5–5.1)
SODIUM: 135 mmol/L (ref 135–145)

## 2016-02-09 MED ORDER — LEVOFLOXACIN 750 MG PO TABS
750.0000 mg | ORAL_TABLET | ORAL | Status: DC
Start: 2016-02-10 — End: 2016-02-10

## 2016-02-09 MED ORDER — CYCLOBENZAPRINE HCL 5 MG PO TABS: 5.0000 mg | ORAL_TABLET | Freq: Three times a day (TID) | ORAL | Status: DC

## 2016-02-09 MED ORDER — HYDROCODONE-ACETAMINOPHEN 5-325 MG PO TABS: 1.0000 | ORAL_TABLET | Freq: Four times a day (QID) | ORAL | 0 refills | Status: DC | PRN

## 2016-02-09 MED ORDER — LEVOFLOXACIN 750 MG PO TABS: 750.0000 mg | ORAL_TABLET | ORAL | Status: DC

## 2016-02-09 MED ORDER — PREDNISONE 10 MG (21) PO TBPK: ORAL_TABLET | ORAL | 0 refills | Status: DC

## 2016-02-09 NOTE — Care Management Important Message (Signed)
Important Message  Patient Details  Name: Caroline [name] MRN: [id number] Date of Birth: Nov 04, 1934   Medicare Important Message Given:  Yes    Sherald Barge, RN 02/09/2016, 12:29 PM

## 2016-02-09 NOTE — Progress Notes (Addendum)
Physical Therapy Treatment Patient Details Name: Caroline [name] MRN: [id number] DOB: 1934-12-15 Today's Date: 02/09/2016    History of Present Illness 81 yo F admitted 8/1 for back pain s/p fall 7/17 (tripped over O2 concentrator) with CT of the lumbar spine and pelvic XR that showed no acute abnormality.  Recent hospitalization 7/19-7/22/2017.  Unable to work with HHPT due to chronic pain.  Now with cough over the past 2 days.  Dx: PNA.   COPD - O2 dependent, Afib on chronic anticoagulation, spinal stenosis, PAD, HTN, CVA, AAA, DVT, TIA, appendectomy, L CEA, gallbladder surgery, patch angioplasty.    PT Comments    Pt received in bed, and was agreeable to PT tx.  Dtr arrived during tx.  Pt demonstrated improved ability to relax during exercises, therefore decreasing pain.  Improved transfer supine<>sit and sit<>stand.  She continues to have difficulty maintaining sitting or standing position due to c/o intense pain in the L hip and lower back.  Pt able to tolerate sitting up in the chair for ~15 min yesterday, and then ~44min today before needing to go back to bed due to HR being irregular & tachycardic per telemetry.  Continue to recommend SNF due to continued impaired mobility.   Follow Up Recommendations  SNF     Equipment Recommendations  None recommended by PT (If pt goes home she may need a RW. )    Recommendations for Other Services       Precautions / Restrictions Precautions Precautions: Fall Precaution Comments: Spinal precuations due to extensive back pain Restrictions Weight Bearing Restrictions: No    Mobility  Bed Mobility Overal bed mobility: Needs Assistance Bed Mobility: Rolling;Sidelying to Sit Rolling: Supervision Sidelying to sit: Min assist (vc's for hand placement) Supine to sit: Min assist     General bed mobility comments: Pt only able to sit on the EOB for a short period of time due to extensive pain.   Transfers Overall transfer level: Needs  assistance Equipment used: Rolling walker (2 wheeled) Transfers: Sit to/from Stand   Stand pivot transfers: Min guard       General transfer comment: vc's for hand placement for transfer.   Ambulation/Gait Ambulation/Gait assistance: Min guard   Assistive device: Rolling walker (2 wheeled) Gait Pattern/deviations: Step-through pattern     General Gait Details: Pt able to ambulate 55ft - distance limited due to increasing pain in L hip/low back area.    Stairs            Wheelchair Mobility    Modified Rankin (Stroke Patients Only)       Balance   Sitting-balance support: Bilateral upper extremity supported Sitting balance-Leahy Scale: Good     Standing balance support: Bilateral upper extremity supported Standing balance-Leahy Scale: Fair                      Cognition Arousal/Alertness: Awake/alert Behavior During Therapy: WFL for tasks assessed/performed Overall Cognitive Status: Within Functional Limits for tasks assessed                      Exercises Other Exercises Other Exercises: DKTC x 10 reps-improved deep breathing during exercises.  Other Exercises: supine lumbar rotation in hooklying x10 reps Other Exercises: Bridge x 10 reps within limited range.     General Comments        Pertinent Vitals/Pain Pain Score: 10-Worst pain ever Pain Location: back, sacral and L hip  Pain Descriptors / Indicators: Hervey Ard  Pain Intervention(s): Limited activity within patient's tolerance;Relaxation;Repositioned;Patient requesting pain meds-RN notified    Home Living                      Prior Function            PT Goals (current goals can now be found in the care plan section) Acute Rehab PT Goals Patient Stated Goal: Pt wants to have less pain PT Goal Formulation: With patient Time For Goal Achievement: 02/14/16 Potential to Achieve Goals: Fair Progress towards PT goals: Progressing toward goals    Frequency  Min  5X/week    PT Plan Current plan remains appropriate    Co-evaluation             End of Session Equipment Utilized During Treatment: Oxygen;Gait belt Activity Tolerance: Patient limited by pain Patient left: in chair;with call bell/phone within reach;with family/visitor present     Time: [id number] PT Time Calculation (min) (ACUTE ONLY): 28 min  Charges:  $Gait Training: 8-22 mins $Therapeutic Exercise: 8-22 mins                    G Codes:      Beth Nafeesah Lapaglia, PT, DPT X: [phone]

## 2016-02-09 NOTE — Progress Notes (Signed)
PROGRESS NOTE  [name]  [id number] DOB: Jan 19, 1935 DOA: 02/06/2016 PCP: Glo Herring., MD Outpatient Specialists:  Subjective: Back pain is improved since yesterday, still has difficulty with ambulation.  Brief Narrative:  Caroline [name]  is a 81 y.o. female, With history of COPD, atrial fibrillation on chronic anticoagulation, spinal stenosis number peripheral arterial disease, hypertension, CVA, AAA who was sent to the hospital by patient's PCP for pain control. Apparently patient fell on 01/22/2016 at home, at that time imaging studies including CT of the lumbar spine, pelvic x-ray showed no acute abnormality. Patient was discharged home on 01/27/2016 with home health physical therapy services. The patient has not been able to work with physical therapy due to chronic pain, and there was sent to the ED by her PCP for pain management. In the ED chest x-ray shows bibasilar opacity, patient started on Levaquin for possible pneumonia. She denies chest pain, has been coughing up phlegm. No change in shortness of breath. Patient is on oxygen via nasal cannula 2474 for COPD.  Assessment & Plan:   Active Problems:   FIBRILLATION, ATRIAL   COPD (chronic obstructive pulmonary disease) (HCC)   Fall at home   Pneumonia   CAP (community acquired pneumonia)   Community-acquired pneumonia -Started on pneumonia protocol. Follow blood cultures, urinary strep pneumo antigen, sputum culture. -Continue supportive management with bronchodilators, mucolytics, antitussives and oxygen as needed. -Seems like she is doing much better, less cough and less sputum production.  Low back pain -Uncontrolled, start Vicodin when necessary, Flexeril, steroids and hot/cold pads. -Seen by PT and recommended skilled nursing facility, likely for discharge in a.m.  COPD exacerbation with chronic respiratory failure with hypoxia -She is on 2-3 L of oxygen and home secondary to COPD. -Reported cough, sputum  production and wheezing 2 days prior to admission. -Started on bronchodilators, mucolytics and Solu-Medrol IV every 6 hours. -She started also on levofloxacin.  Atrial fibrillation -CHADS2Vasc score is 7, continue verapamil, Pradaxa for anticoagulation.  Peripheral vascular disease- stable, continue Pletal.    DVT prophylaxis: Pradaxa Code Status: Full Code Family Communication: Plan discussed with the patient's daughter at bedside. Disposition Plan: To SNF likely in a.m. Diet: Diet Heart Room service appropriate? Yes; Fluid consistency: Thin Diet - low sodium heart healthy  Consultants:   None  Procedures:   None  Antimicrobials:   Levaquin   Objective: Vitals:   02/08/16 2241 02/08/16 2307 02/09/16 0923 02/09/16 1400  BP: (!) 96/54  (!) 107/53 129/76  Pulse: 82   99  Resp: 20   18  Temp: 98.5 F (36.9 C)   97.8 F (36.6 C)  TempSrc: Oral   Oral  SpO2: 100% 100%  100%  Weight:      Height:        Intake/Output Summary (Last 24 hours) at 02/09/16 1435 Last data filed at 02/09/16 1146  Gross per 24 hour  Intake              480 ml  Output              850 ml  Net             -370 ml   Filed Weights   02/06/16 2324  Weight: 40.4 kg (89 lb 1.6 oz)    Examination: General exam: Appears calm and comfortable  Respiratory system: Clear to auscultation. Respiratory effort normal. Cardiovascular system: S1 & S2 heard, RRR. No JVD, murmurs, rubs, gallops or clicks. No pedal edema. Gastrointestinal system:  Abdomen is nondistended, soft and nontender. No organomegaly or masses felt. Normal bowel sounds heard. Central nervous system: Alert and oriented. No focal neurological deficits. Extremities: Symmetric 5 x 5 power. Skin: No rashes, lesions or ulcers Psychiatry: Judgement and insight appear normal. Mood & affect appropriate.   Data Reviewed: I have personally reviewed following labs and imaging studies  CBC:  Recent Labs Lab 02/06/16 2020  WBC 10.1    NEUTROABS 8.0*  HGB 10.9*  HCT 33.5*  MCV 84.0  PLT [id number]   Basic Metabolic Panel:  Recent Labs Lab 02/06/16 2020 02/09/16 0550  NA 142 135  K 3.4* 5.2*  CL 102 99*  CO2 32 31  GLUCOSE 121* 152*  BUN 21* 40*  CREATININE 0.51 0.47  CALCIUM 8.7* 8.3*   GFR: Estimated Creatinine Clearance: 36.4 mL/min (by C-G formula based on SCr of 0.8 mg/dL). Liver Function Tests:  Recent Labs Lab 02/06/16 2020  AST 19  ALT 19  ALKPHOS 124  BILITOT 0.6  PROT 5.8*  ALBUMIN 3.2*   No results for input(s): LIPASE, AMYLASE in the last 168 hours. No results for input(s): AMMONIA in the last 168 hours. Coagulation Profile: No results for input(s): INR, PROTIME in the last 168 hours. Cardiac Enzymes: No results for input(s): CKTOTAL, CKMB, CKMBINDEX, TROPONINI in the last 168 hours. BNP (last 3 results) No results for input(s): PROBNP in the last 8760 hours. HbA1C: No results for input(s): HGBA1C in the last 72 hours. CBG: No results for input(s): GLUCAP in the last 168 hours. Lipid Profile: No results for input(s): CHOL, HDL, LDLCALC, TRIG, CHOLHDL, LDLDIRECT in the last 72 hours. Thyroid Function Tests: No results for input(s): TSH, T4TOTAL, FREET4, T3FREE, THYROIDAB in the last 72 hours. Anemia Panel: No results for input(s): VITAMINB12, FOLATE, FERRITIN, TIBC, IRON, RETICCTPCT in the last 72 hours. Urine analysis:    Component Value Date/Time   COLORURINE YELLOW 02/06/2016 1945   APPEARANCEUR CLEAR 02/06/2016 1945   LABSPEC 1.025 02/06/2016 1945   PHURINE 5.5 02/06/2016 1945   GLUCOSEU NEGATIVE 02/06/2016 1945   HGBUR NEGATIVE 02/06/2016 1945   BILIRUBINUR NEGATIVE 02/06/2016 1945   KETONESUR NEGATIVE 02/06/2016 1945   PROTEINUR TRACE (A) 02/06/2016 1945   UROBILINOGEN 2.0 (H) 06/30/2014 1244   NITRITE NEGATIVE 02/06/2016 1945   LEUKOCYTESUR NEGATIVE 02/06/2016 1945   Sepsis Labs: @LABRCNTIP (procalcitonin:4,lacticidven:4)  ) Recent Results (from the past 240  hour(s))  Urine culture     Status: None   Collection Time: 02/06/16  7:45 PM  Result Value Ref Range Status   Specimen Description URINE, CLEAN CATCH  Final   Special Requests NONE  Final   Culture NO GROWTH Performed at [facility]   Final   Report Status 02/08/2016 FINAL  Final  Blood culture (routine x 2)     Status: None (Preliminary result)   Collection Time: 02/06/16  8:48 PM  Result Value Ref Range Status   Specimen Description BLOOD LEFT ARM  Final   Special Requests BOTTLES DRAWN AEROBIC AND ANAEROBIC 6CC  Final   Culture NO GROWTH 3 DAYS  Final   Report Status PENDING  Incomplete  Blood culture (routine x 2)     Status: None (Preliminary result)   Collection Time: 02/06/16  8:58 PM  Result Value Ref Range Status   Specimen Description BLOOD LEFT ARM  Final   Special Requests BOTTLES DRAWN AEROBIC AND ANAEROBIC 6CC  Final   Culture NO GROWTH 3 DAYS  Final   Report Status PENDING  Incomplete     Invalid input(s): PROCALCITONIN, LACTICACIDVEN   Radiology Studies: No results found.      Scheduled Meds: . antiseptic oral rinse  7 mL Mouth Rinse BID  . cilostazol  50 mg Oral BID  . cyclobenzaprine  5 mg Oral TID  . dabigatran  150 mg Oral BID  . feeding supplement  1 Container Oral TID BM  . guaiFENesin  600 mg Oral BID  . hydrALAZINE  10 mg Oral BID  . [START ON 02/10/2016] levofloxacin  750 mg Oral Q48H  . lidocaine  1 patch Transdermal Q24H  . methylPREDNISolone (SOLU-MEDROL) injection  60 mg Intravenous Q6H  . pantoprazole  40 mg Oral Daily  . sodium chloride flush  3 mL Intravenous Q12H  . verapamil  240 mg Oral Daily   Continuous Infusions:    LOS: 3 days    Time spent: 35 minutes    Urian Martenson A, MD [facility] Pager [phone]  If 7PM-7AM, please contact night-coverage [email or web address] Password [email or web address] 02/09/2016, 2:35 PM

## 2016-02-09 NOTE — Clinical Social Work Note (Signed)
Clinical Social Work Assessment  Patient Details  Name: Caroline [name] MRN: [id number] Date of Birth: [birth year]  Date of referral:  02/09/16               Reason for consult:  Discharge Planning                Permission sought to share information with:  Family Supports Permission granted to share information::  Yes, Verbal Permission Granted  Name::     Investment banker, corporate::     Relationship::  daughter  Contact Information:     Housing/Transportation Living arrangements for the past 2 months:  [place] of Information:  Patient, Adult Children Patient Interpreter Needed:  None Criminal Activity/Legal Involvement Pertinent to Current Situation/Hospitalization:  No - Comment as needed Significant Relationships:  Adult Children Lives with:  Self Do you feel safe going back to the place where you live?  No (needs rehab) Need for family participation in patient care:  Yes (Comment)  Care giving concerns:  Pt lives alone and is not at baseline.    Social Worker assessment / plan:  CSW met with pt at bedside. Pt alert and oriented and states she lives alone. She has four daughters who are supportive, two who live locally and help out a lot. Pt describes Rise Paganini as her best support. Pt recently d/c from hospital and has not been at her baseline. She typically ambulates with a walker. PT evaluated pt and recommend SNF. CSW discussed placement with pt and Rise Paganini, both are agreeable and request [facility]. Facility can accept pt and anticipate d/c Saturday per MD. Aware of Medicare coverage/criteria for SNF.   Employment status:  Retired Forensic scientist:  Medicare PT Recommendations:  [place] / Referral to community resources:  [place]  Patient/Family's Response to care:  Pt is reluctantly agreeable to short term SNF when medically stable. Rise Paganini feels this is also necessary.   Patient/Family's Understanding of and Emotional  Response to Diagnosis, Current Treatment, and Prognosis:  Pt is understanding of admission diagnosis and recommendation for SNF at d/c. She states, "I dread it and it's going to hurt, but I have to go."   Emotional Assessment Appearance:  Appears stated age Attitude/Demeanor/Rapport:  Other (Cooperative) Affect (typically observed):  Accepting Orientation:  Oriented to Self, Oriented to Place, Oriented to  Time, Oriented to Situation Alcohol / Substance use:  Not Applicable Psych involvement (Current and /or in the community):  No (Comment)  Discharge Needs  Concerns to be addressed:  Discharge Planning Concerns Readmission within the last 30 days:  Yes Current discharge risk:  Lives alone, Physical Impairment Barriers to Discharge:  Continued Medical Work up   [employer], [place] 02/09/2016, 12:24 PM [phone]

## 2016-02-09 NOTE — Care Management Note (Signed)
Case Management Note  Patient Details  Name: CLAIRISSA TORNER MRN: [id number] Date of Birth: 04/08/1935  Expected Discharge Date:     02/10/2016             Expected Discharge Plan:  [place]  In-House Referral:  Clinical Social Work  Discharge planning Services  CM Consult  Post Acute Care Choice:  NA Choice offered to:  NA  DME Arranged:    DME Agency:     HH Arranged:    [place] Agency:     Status of Service:  Completed, signed off  If discussed at [employer], dates discussed:    Additional Comments: Pt plans on SNF placement at DC. CSW is aware of plan and has made arrangements for placement at DC. [facility] made aware of DC plan. Anticipate DC over weekend.  Sherald Barge, RN 02/09/2016, 12:30 PM

## 2016-02-09 NOTE — Clinical Social Work Placement (Signed)
   CLINICAL SOCIAL WORK PLACEMENT  NOTE  Date:  02/09/2016  Patient Details  Name: Caroline [name] MRN: [id number] Date of Birth: 1935-06-29  Clinical Social Work is seeking post-discharge placement for this patient at the [place] level of care (*CSW will initial, date and re-position this form in  chart as items are completed):  Yes   Patient/family provided with [facility] [facility] Work Department's list of facilities offering this level of care within the geographic area requested by the patient (or if unable, by the patient's family).  Yes   Patient/family informed of their freedom to choose among providers that offer the needed level of care, that participate in Medicare, Medicaid or managed care program needed by the patient, have an available bed and are willing to accept the patient.  Yes   Patient/family informed of [facility]'s ownership interest in [facility] and [facility], as well as of the fact that they are under no obligation to receive care at these facilities.  PASRR submitted to EDS on 02/08/16     PASRR number received on 02/08/16     Existing PASRR number confirmed on       FL2 transmitted to all facilities in geographic area requested by pt/family on 02/09/16     FL2 transmitted to all facilities within larger geographic area on       Patient informed that his/her managed care company has contracts with or will negotiate with certain facilities, including the following:        Yes   Patient/family informed of bed offers received.  Patient chooses bed at [facility]     Physician recommends and patient chooses bed at      Patient to be transferred to [facility] on  .  Patient to be transferred to facility by       Patient family notified on   of transfer.  Name of family member notified:        PHYSICIAN       Additional Comment:    _______________________________________________ Salome Arnt, LCSW 02/09/2016, 11:41 AM [phone]

## 2016-02-10 ENCOUNTER — Inpatient Hospital Stay
Admission: RE | Admit: 2016-02-10 | Discharge: 2016-02-14 | Disposition: A | Discharge status: Nursing Home (Includes ICF) | Payer: Medicare Other | Source: Ambulatory Visit | Attending: Pediatrics | Admitting: Pediatrics

## 2016-02-10 DIAGNOSIS — R339 Retention of urine, unspecified: Secondary | ICD-10-CM | POA: Diagnosis not present

## 2016-02-10 DIAGNOSIS — K625 Hemorrhage of anus and rectum: Secondary | ICD-10-CM | POA: Diagnosis not present

## 2016-02-10 DIAGNOSIS — K640 First degree hemorrhoids: Secondary | ICD-10-CM | POA: Diagnosis not present

## 2016-02-10 DIAGNOSIS — Z9181 History of falling: Secondary | ICD-10-CM | POA: Diagnosis not present

## 2016-02-10 DIAGNOSIS — J189 Pneumonia, unspecified organism: Secondary | ICD-10-CM | POA: Diagnosis not present

## 2016-02-10 DIAGNOSIS — R05 Cough: Secondary | ICD-10-CM | POA: Diagnosis not present

## 2016-02-10 DIAGNOSIS — M79605 Pain in left leg: Secondary | ICD-10-CM | POA: Diagnosis not present

## 2016-02-10 DIAGNOSIS — I10 Essential (primary) hypertension: Secondary | ICD-10-CM | POA: Diagnosis not present

## 2016-02-10 DIAGNOSIS — I482 Chronic atrial fibrillation: Secondary | ICD-10-CM | POA: Diagnosis not present

## 2016-02-10 DIAGNOSIS — Z79891 Long term (current) use of opiate analgesic: Secondary | ICD-10-CM | POA: Diagnosis not present

## 2016-02-10 DIAGNOSIS — F29 Unspecified psychosis not due to a substance or known physiological condition: Secondary | ICD-10-CM | POA: Diagnosis not present

## 2016-02-10 DIAGNOSIS — R293 Abnormal posture: Secondary | ICD-10-CM | POA: Diagnosis not present

## 2016-02-10 DIAGNOSIS — Z720 Tobacco use: Secondary | ICD-10-CM | POA: Diagnosis not present

## 2016-02-10 DIAGNOSIS — E43 Unspecified severe protein-calorie malnutrition: Secondary | ICD-10-CM | POA: Diagnosis not present

## 2016-02-10 DIAGNOSIS — M898X9 Other specified disorders of bone, unspecified site: Secondary | ICD-10-CM | POA: Diagnosis not present

## 2016-02-10 DIAGNOSIS — J90 Pleural effusion, not elsewhere classified: Secondary | ICD-10-CM | POA: Diagnosis not present

## 2016-02-10 DIAGNOSIS — G255 Other chorea: Secondary | ICD-10-CM | POA: Diagnosis not present

## 2016-02-10 DIAGNOSIS — R488 Other symbolic dysfunctions: Secondary | ICD-10-CM | POA: Diagnosis not present

## 2016-02-10 DIAGNOSIS — Z79899 Other long term (current) drug therapy: Secondary | ICD-10-CM | POA: Diagnosis not present

## 2016-02-10 DIAGNOSIS — I480 Paroxysmal atrial fibrillation: Secondary | ICD-10-CM | POA: Diagnosis not present

## 2016-02-10 DIAGNOSIS — Z961 Presence of intraocular lens: Secondary | ICD-10-CM | POA: Diagnosis not present

## 2016-02-10 DIAGNOSIS — K5900 Constipation, unspecified: Secondary | ICD-10-CM | POA: Diagnosis not present

## 2016-02-10 DIAGNOSIS — H04123 Dry eye syndrome of bilateral lacrimal glands: Secondary | ICD-10-CM | POA: Diagnosis not present

## 2016-02-10 DIAGNOSIS — M1288 Other specific arthropathies, not elsewhere classified, other specified site: Secondary | ICD-10-CM | POA: Diagnosis not present

## 2016-02-10 DIAGNOSIS — H34211 Partial retinal artery occlusion, right eye: Secondary | ICD-10-CM | POA: Diagnosis not present

## 2016-02-10 DIAGNOSIS — I952 Hypotension due to drugs: Secondary | ICD-10-CM | POA: Diagnosis not present

## 2016-02-10 DIAGNOSIS — I639 Cerebral infarction, unspecified: Secondary | ICD-10-CM | POA: Diagnosis not present

## 2016-02-10 DIAGNOSIS — M25552 Pain in left hip: Secondary | ICD-10-CM | POA: Diagnosis not present

## 2016-02-10 DIAGNOSIS — H2511 Age-related nuclear cataract, right eye: Secondary | ICD-10-CM | POA: Diagnosis not present

## 2016-02-10 DIAGNOSIS — D3132 Benign neoplasm of left choroid: Secondary | ICD-10-CM | POA: Diagnosis not present

## 2016-02-10 DIAGNOSIS — H2513 Age-related nuclear cataract, bilateral: Secondary | ICD-10-CM | POA: Diagnosis not present

## 2016-02-10 DIAGNOSIS — J438 Other emphysema: Secondary | ICD-10-CM | POA: Diagnosis not present

## 2016-02-10 DIAGNOSIS — Z87891 Personal history of nicotine dependence: Secondary | ICD-10-CM | POA: Diagnosis not present

## 2016-02-10 DIAGNOSIS — R000 Tachycardia, unspecified: Secondary | ICD-10-CM | POA: Diagnosis not present

## 2016-02-10 DIAGNOSIS — H353112 Nonexudative age-related macular degeneration, right eye, intermediate dry stage: Secondary | ICD-10-CM | POA: Diagnosis not present

## 2016-02-10 DIAGNOSIS — R260 Ataxic gait: Secondary | ICD-10-CM | POA: Diagnosis not present

## 2016-02-10 DIAGNOSIS — J9611 Chronic respiratory failure with hypoxia: Secondary | ICD-10-CM | POA: Diagnosis not present

## 2016-02-10 DIAGNOSIS — F419 Anxiety disorder, unspecified: Secondary | ICD-10-CM | POA: Diagnosis not present

## 2016-02-10 DIAGNOSIS — F329 Major depressive disorder, single episode, unspecified: Secondary | ICD-10-CM | POA: Diagnosis not present

## 2016-02-10 DIAGNOSIS — J9601 Acute respiratory failure with hypoxia: Secondary | ICD-10-CM | POA: Diagnosis not present

## 2016-02-10 DIAGNOSIS — W19XXXA Unspecified fall, initial encounter: Secondary | ICD-10-CM | POA: Diagnosis not present

## 2016-02-10 DIAGNOSIS — R279 Unspecified lack of coordination: Secondary | ICD-10-CM | POA: Diagnosis not present

## 2016-02-10 DIAGNOSIS — M6281 Muscle weakness (generalized): Secondary | ICD-10-CM | POA: Diagnosis not present

## 2016-02-10 DIAGNOSIS — R0602 Shortness of breath: Secondary | ICD-10-CM | POA: Diagnosis not present

## 2016-02-10 DIAGNOSIS — K921 Melena: Secondary | ICD-10-CM | POA: Diagnosis not present

## 2016-02-10 DIAGNOSIS — M545 Low back pain: Secondary | ICD-10-CM | POA: Diagnosis not present

## 2016-02-10 DIAGNOSIS — R5383 Other fatigue: Secondary | ICD-10-CM | POA: Diagnosis not present

## 2016-02-10 DIAGNOSIS — I2510 Atherosclerotic heart disease of native coronary artery without angina pectoris: Secondary | ICD-10-CM | POA: Diagnosis not present

## 2016-02-10 DIAGNOSIS — I714 Abdominal aortic aneurysm, without rupture: Secondary | ICD-10-CM | POA: Diagnosis not present

## 2016-02-10 DIAGNOSIS — D2211 Melanocytic nevi of right eyelid, including canthus: Secondary | ICD-10-CM | POA: Diagnosis not present

## 2016-02-10 DIAGNOSIS — G8929 Other chronic pain: Secondary | ICD-10-CM | POA: Diagnosis not present

## 2016-02-10 DIAGNOSIS — E876 Hypokalemia: Secondary | ICD-10-CM | POA: Diagnosis not present

## 2016-02-10 DIAGNOSIS — I739 Peripheral vascular disease, unspecified: Secondary | ICD-10-CM | POA: Diagnosis not present

## 2016-02-10 DIAGNOSIS — J449 Chronic obstructive pulmonary disease, unspecified: Secondary | ICD-10-CM | POA: Diagnosis not present

## 2016-02-10 DIAGNOSIS — E875 Hyperkalemia: Secondary | ICD-10-CM | POA: Diagnosis not present

## 2016-02-10 DIAGNOSIS — I959 Hypotension, unspecified: Secondary | ICD-10-CM | POA: Diagnosis not present

## 2016-02-10 DIAGNOSIS — R195 Other fecal abnormalities: Secondary | ICD-10-CM | POA: Diagnosis not present

## 2016-02-10 DIAGNOSIS — J441 Chronic obstructive pulmonary disease with (acute) exacerbation: Secondary | ICD-10-CM | POA: Diagnosis not present

## 2016-02-10 NOTE — Progress Notes (Signed)
Called report to [facility] at [facility].

## 2016-02-10 NOTE — Progress Notes (Signed)
Patient to be discharged today to [facility]. All paperwork completed and agreeable for [facility].  RN called and made aware good to transport to [facility].  DC by staff. Placement note completed.   Lane Hacker, MSW Clinical Social Work: System Print production planner for [employer] social worker [phone]

## 2016-02-10 NOTE — Clinical Social Work Placement (Signed)
   CLINICAL SOCIAL WORK PLACEMENT  NOTE  Date:  02/10/2016  Patient Details  Name: Caroline [name] MRN: [id number] Date of Birth: 1935/01/28  Clinical Social Work is seeking post-discharge placement for this patient at the [place] level of care (*CSW will initial, date and re-position this form in  chart as items are completed):  Yes   Patient/family provided with [place] Work Department's list of facilities offering this level of care within the geographic area requested by the patient (or if unable, by the patient's family).  Yes   Patient/family informed of their freedom to choose among providers that offer the needed level of care, that participate in Medicare, Medicaid or managed care program needed by the patient, have an available bed and are willing to accept the patient.  Yes   Patient/family informed of [place]'s ownership interest in [facility] and [facility], as well as of the fact that they are under no obligation to receive care at these facilities.  PASRR submitted to EDS on 02/08/16     PASRR number received on 02/08/16     Existing PASRR number confirmed on       FL2 transmitted to all facilities in geographic area requested by pt/family on 02/09/16     FL2 transmitted to all facilities within larger geographic area on       Patient informed that his/her managed care company has contracts with or will negotiate with certain facilities, including the following:        Yes   Patient/family informed of bed offers received.  Patient chooses bed at [facility]     Physician recommends and patient chooses bed at      Patient to be transferred to [facility] on  . 02/10/2016   Patient to be transferred to facility by     staff  Patient family notified on   of transfer. Family notified by CSW Friday.  Name of family member notified:        PHYSICIAN       Additional Comment:     _______________________________________________ Lilly Cove, LCSW 02/10/2016, 10:52 AM

## 2016-02-10 NOTE — Discharge Summary (Signed)
Physician Discharge Summary  Caroline [name] [id number] DOB: Jan 07, 1935 DOA: 02/06/2016  PCP: Glo Herring., MD  Admit date: 02/06/2016 Discharge date: 02/10/2016  Admitted From: Home Disposition:  [facility]  Recommendations for Outpatient Follow-up:  1. Follow up with PCP in 1-2 weeks 2. Continue levofloxacin for 3 more doses, continue prednisone taper till it's gone  Home Health: None Equipment/Devices: N/A  Discharge Condition: Stable CODE STATUS: Full Code Diet recommendation: Heart Healthy   Brief/Interim Summary: Caroline [name]  is a 81 y.o. female, With history of COPD, atrial fibrillation on chronic anticoagulation, spinal stenosis number peripheral arterial disease, hypertension, CVA, AAA who was sent to the hospital by patient's PCP for pain control. Apparently patient fell on 01/22/2016 at home, at that time imaging studies including CT of the lumbar spine, pelvic x-ray showed no acute abnormality. Patient was discharged home on 01/27/2016 with home health physical therapy services. The patient has not been able to work with physical therapy due to chronic pain, and there was sent to the ED by her PCP for pain management. In the ED chest x-ray shows bibasilar opacity, patient started on Levaquin for possible pneumonia. She denies chest pain, has been coughing up phlegm. No change in shortness of breath. Patient is on oxygen via nasal cannula 2474 for COPD.  Discharge Diagnoses:  Active Problems:   FIBRILLATION, ATRIAL   COPD (chronic obstructive pulmonary disease) (HCC)   Fall at home   Pneumonia   CAP (community acquired pneumonia)   Community-acquired pneumonia -Started on pneumonia protocol. Follow blood cultures, urinary strep pneumo antigen, sputum culture. -Had some shortness of breath on admission, CXR showed patchy infiltrates consistent with pneumonia. -Started on levofloxacin in the ED, this is continued. -Continue supportive management with  bronchodilators, mucolytics, antitussives and oxygen as needed. -Symptoms improved, discharged on levofloxacin for 3 more doses.  Low back pain -Uncontrolled, start Vicodin when necessary, Flexeril, steroids and hot/cold pads. -Seen by PT and recommended skilled nursing facility, likely for discharge in a.m. -Continues to have severe pain, CT of lumbar spine (from 7/19) did not show any acute events.  COPD exacerbation with chronic respiratory failure with hypoxia -She is on 2-3 L of oxygen and home secondary to COPD. -Reported cough, sputum production and wheezing 2 days prior to admission. -Started on bronchodilators, mucolytics and Solu-Medrol IV every 6 hours. -Charge on levofloxacin and prednisone taper.  Atrial fibrillation -CHADS2Vasc score is 7, continue verapamil, Pradaxa for anticoagulation.  Peripheral vascular disease- stable, continue Pletal.   Discharge Instructions  Discharge Instructions    Diet - low sodium heart healthy    Complete by:  As directed   Increase activity slowly    Complete by:  As directed       Medication List    STOP taking these medications   predniSONE 10 MG tablet Commonly known as:  DELTASONE Replaced by:  predniSONE 10 MG (21) Tbpk tablet   traMADol-acetaminophen 37.5-325 MG tablet Commonly known as:  ULTRACET     TAKE these medications   cilostazol 100 MG tablet Commonly known as:  PLETAL Take 0.5 tablets (50 mg total) by mouth 2 (two) times daily.   cyclobenzaprine 5 MG tablet Commonly known as:  FLEXERIL Take 1 tablet (5 mg total) by mouth 3 (three) times daily.   dabigatran 150 MG Caps capsule Commonly known as:  PRADAXA Take 150 mg by mouth 2 (two) times daily.   hydrALAZINE 10 MG tablet Commonly known as:  APRESOLINE Take 1 tablet (10  mg total) by mouth 2 (two) times daily. New medication to help with treatment of your high blood pressure.   HYDROcodone-acetaminophen 5-325 MG tablet Commonly known as:   NORCO/VICODIN Take 1 tablet by mouth every 6 (six) hours as needed for moderate pain.   levalbuterol 0.63 MG/3ML nebulizer solution Commonly known as:  XOPENEX Take 3 mLs (0.63 mg total) by nebulization every 4 (four) hours as needed for wheezing or shortness of breath.   levofloxacin 750 MG tablet Commonly known as:  LEVAQUIN Take 1 tablet (750 mg total) by mouth every other day.   lidocaine 4 % cream Commonly known as:  LMX Apply 1 application topically daily as needed (for pain (hips/back)).   multivitamin with minerals Tabs tablet Take 1 tablet by mouth daily.   pantoprazole 40 MG tablet Commonly known as:  PROTONIX Take 1 tablet (40 mg total) by mouth daily.   predniSONE 10 MG (21) Tbpk tablet Commonly known as:  STERAPRED UNI-PAK 21 TAB Take 6-5-4-3-2-1 PO orally till gone Replaces:  predniSONE 10 MG tablet   senna-docusate 8.6-50 MG tablet Commonly known as:  Senokot-S Take 2 tablets by mouth at bedtime. For constipation   tiotropium 18 MCG inhalation capsule Commonly known as:  SPIRIVA Place 18 mcg into inhaler and inhale at bedtime.   verapamil 240 MG 24 hr capsule Commonly known as:  VERELAN PM Take 240 mg by mouth daily.      Follow-up Information    Glo Herring., MD.   Specialty:  Internal Medicine Contact information: [address] [place] [id number] [phone]          Allergies  Allergen Reactions  . Ibuprofen     Gi upset  . Pnu-Imune [Pneumococcal Polysaccharide Vaccine]     Huge knot at site and rash  . Advair Diskus [Fluticasone-Salmeterol]     Confusion and shortness of breath  . Albuterol     Confusion and shortness of breath  . Amoxicillin Hives  . Cephalexin Hives  . Codeine     confusion  . Entex Pac [Pseudoephedrine-Gg & Dm] Other (See Comments)    Upper respiratory.   . Etanercept     vertigo  . Gualenic Acid Other (See Comments)    unknown  . Lorazepam     hallucinations  . Nubain [Nalbuphine Hcl]    . Oxycodone Hcl Nausea And Vomiting  . Oxycodone-Acetaminophen Nausea And Vomiting  . Roxicodone [Oxycodone Hcl]   . Tramadol     hallucinations   . Tylox [Oxycodone-Acetaminophen]     Consultations:  Stable   Procedures/Studies: Dg Lumbar Spine Complete  Result Date: 01/24/2016 CLINICAL DATA:  Status post trip and fall this morning with onset of low back pain. Initial encounter. EXAM: LUMBAR SPINE - COMPLETE 4+ VIEW COMPARISON:  CT abdomen and pelvis 06/30/2014. FINDINGS: There is no fracture. Trace anterolisthesis L5 on S1 due to facet arthropathy is unchanged. Intervertebral disc space height is maintained. The patient has an abdominal aortic aneurysm measuring 4.4 cm AP, increased from approximately 3.1 cm on the prior examination. Large colonic stool burden is noted. IMPRESSION: No acute abnormality. 4.4 cm abdominal aortic aneurysm is increased from 3.1 cm on the prior exam. Recommend follow-up ultrasound in 1 year. This recommendation follows ACR consensus guidelines. Large colonic stool burden. Electronically Signed   By: Inge Rise M.D.   On: 01/24/2016 07:12   Dg Pelvis 1-2 Views  Result Date: 01/24/2016 CLINICAL DATA:  Status post trip and fall this morning with onset  of pelvic pain. Initial encounter. EXAM: PELVIS - 1-2 VIEW COMPARISON:  CT abdomen and pelvis 06/30/2014. FINDINGS: There is no evidence of pelvic fracture or diastasis. No pelvic bone lesions are seen. Bones are osteopenic. Large colonic stool burden is noted. IMPRESSION: No acute abnormality. Osteopenia. Large colonic stool burden. Electronically Signed   By: Inge Rise M.D.   On: 01/24/2016 07:13   Ct Lumbar Spine Wo Contrast  Result Date: 01/24/2016 CLINICAL DATA:  Lumbago.  Fall earlier today EXAM: CT LUMBAR SPINE WITHOUT CONTRAST TECHNIQUE: Multidetector CT imaging of the lumbar spine was performed without intravenous contrast administration. Multiplanar CT image reconstructions were also  generated. COMPARISON:  CT abdomen and pelvis with bony reformats June 30, 2014; lumbar radiographs January 24, 2016 FINDINGS: Bones are osteoporotic. There is no evidence of acute fracture or spondylolisthesis. There is slight disc space narrowing at T12-L1. Other disc spaces appear unremarkable. At T12-L1, there is mild generalized disc bulging. There is mild facet hypertrophy bilaterally. There is no nerve root edema or effacement. No disc extrusion or stenosis. At L1-2, there is moderate facet hypertrophy bilaterally. There is mild diffuse disc bulging. There is no nerve root edema or effacement. No disc extrusion or stenosis. At L2-3, there is moderate facet hypertrophy bilaterally and mild diffuse disc bulging. No nerve root edema or effacement. No disc extrusion or stenosis. At L3-4, there is moderate facet hypertrophy bilaterally and mild diffuse disc bulging. No nerve root edema or effacement. No disc extrusion or stenosis. At L4-5, there is a broad-based disc bulging and moderate facet hypertrophy bilaterally. There is mild ligamentum flava hypertrophy bilaterally causing borderline lateral canal stenosis and L4-5 bilaterally. No central canal stenosis noted. No disc extrusion. No nerve root edema or effacement evident. At L5-S1, there is moderate facet hypertrophy bilaterally. There is broad-based disc protrusion without appreciable nerve root edema or effacement. Node disc extrusion or stenosis evident. There is stable calcification between the L2 and L3 spinous processes, possibly residua of old trauma. There is slight lumbar levoscoliosis. There is degenerative change in the sacroiliac joints bilaterally, slightly more severe on the right than on the left. There is extensive calcification in the distal aorta and common iliac arteries. There is aneurysmal dilatation of the distal abdominal aorta with a maximum transverse diameter of 4.3 x 4.0 cm. This aneurysm extends to just above the bifurcation. It  arises inferior to the renal arteries. There is no periaortic fluid. IMPRESSION: No fracture or spondylolisthesis. Multilevel arthropathy. Borderline lateral canal stenosis at L4-5, multifactorial. No central stenosis. No disc extrusion or nerve root edema/effacement. Abdominal aortic aneurysm with maximum transverse diameter of 4.3 x 4.0 cm. Recommend followup by ultrasound in 1 year. This recommendation follows ACR consensus guidelines: White Paper of the ACR Incidental Findings Committee II on Vascular Findings. J Am Coll Radiol 2013; 10:789-794. As this aneurysm has increased in size compared to prior study from December 2015, vascular surgery consultation may also be advisable at this time. Bones osteoporotic. Electronically Signed   By: Lowella Grip III M.D.   On: 01/24/2016 10:14   Dg Chest Portable 1 View  Result Date: 02/06/2016 CLINICAL DATA:  81 year old female with increasing cough and pain. Initial encounter. EXAM: PORTABLE CHEST 1 VIEW COMPARISON:  11/08/2014 and earlier. FINDINGS: Portable AP semi upright view at 1951 hours. Chronic pulmonary hyperinflation. Patchy increased bibasilar opacity slightly greater on the right. No pneumothorax or pulmonary edema. No definite pleural effusion. Stable cardiac size and mediastinal contours. Ir calcified aortic atherosclerosis. Visualized  tracheal air column is within normal limits. IMPRESSION: 1. chronic hyperinflation with patchy acute bibasilar opacity. Favor acute infectious exacerbation. No definite pleural effusion. 2. Calcified aortic atherosclerosis. Electronically Signed   By: Genevie Ann M.D.   On: 02/06/2016 20:12   Dg Hips Bilat With Pelvis Min 5 Views  Result Date: 02/06/2016 CLINICAL DATA:  Fall at home 01/24/2016 (2 weeks prior), persistent bilateral hip pain since fall. EXAM: DG HIP (WITH OR WITHOUT PELVIS) 5+V BILAT COMPARISON:  Pelvis radiographs 01/24/2016 FINDINGS: The cortical margins of the bony pelvis and both hips are intact. No  acute or healing fracture. Pubic symphysis and sacroiliac joints are congruent. Both femoral heads are well-seated in the respective acetabula. Bony under mineralization again seen. Large stool burden. Atherosclerosis and vascular stents. IMPRESSION: 1. No pelvic fracture. 2. Osteopenia. Electronically Signed   By: Jeb Levering M.D.   On: 02/06/2016 22:54    (Echo, Carotid, EGD, Colonoscopy, ERCP)    Subjective:   Discharge Exam: Vitals:   02/09/16 2100 02/10/16 0500  BP: 107/81 (!) 98/52  Pulse: 76 78  Resp: 20 20  Temp: 98.1 F (36.7 C) 97.7 F (36.5 C)   Vitals:   02/09/16 1610 02/09/16 2020 02/09/16 2100 02/10/16 0500  BP:   107/81 (!) 98/52  Pulse:   76 78  Resp:   20 20  Temp:   98.1 F (36.7 C) 97.7 F (36.5 C)  TempSrc:   Oral Oral  SpO2: 98% 98% 97% 97%  Weight:      Height:        General: Pt is alert, awake, not in acute distress Cardiovascular: RRR, S1/S2 +, no rubs, no gallops Respiratory: CTA bilaterally, no wheezing, no rhonchi Abdominal: Soft, NT, ND, bowel sounds + Extremities: no edema, no cyanosis    The results of significant diagnostics from this hospitalization (including imaging, microbiology, ancillary and laboratory) are listed below for reference.     Microbiology: Recent Results (from the past 240 hour(s))  Urine culture     Status: None   Collection Time: 02/06/16  7:45 PM  Result Value Ref Range Status   Specimen Description URINE, CLEAN CATCH  Final   Special Requests NONE  Final   Culture NO GROWTH Performed at [facility]   Final   Report Status 02/08/2016 FINAL  Final  Blood culture (routine x 2)     Status: None (Preliminary result)   Collection Time: 02/06/16  8:48 PM  Result Value Ref Range Status   Specimen Description BLOOD LEFT ARM  Final   Special Requests BOTTLES DRAWN AEROBIC AND ANAEROBIC 6CC  Final   Culture NO GROWTH 4 DAYS  Final   Report Status PENDING  Incomplete  Blood culture (routine x 2)      Status: None (Preliminary result)   Collection Time: 02/06/16  8:58 PM  Result Value Ref Range Status   Specimen Description BLOOD LEFT ARM  Final   Special Requests BOTTLES DRAWN AEROBIC AND ANAEROBIC 6CC  Final   Culture NO GROWTH 4 DAYS  Final   Report Status PENDING  Incomplete     Labs: BNP (last 3 results) No results for input(s): BNP in the last 8760 hours. Basic Metabolic Panel:  Recent Labs Lab 02/06/16 2020 02/09/16 0550  NA 142 135  K 3.4* 5.2*  CL 102 99*  CO2 32 31  GLUCOSE 121* 152*  BUN 21* 40*  CREATININE 0.51 0.47  CALCIUM 8.7* 8.3*   Liver Function Tests:  Recent Labs Lab 02/06/16 2020  AST 19  ALT 19  ALKPHOS 124  BILITOT 0.6  PROT 5.8*  ALBUMIN 3.2*   No results for input(s): LIPASE, AMYLASE in the last 168 hours. No results for input(s): AMMONIA in the last 168 hours. CBC:  Recent Labs Lab 02/06/16 2020  WBC 10.1  NEUTROABS 8.0*  HGB 10.9*  HCT 33.5*  MCV 84.0  PLT 285   Cardiac Enzymes: No results for input(s): CKTOTAL, CKMB, CKMBINDEX, TROPONINI in the last 168 hours. BNP: Invalid input(s): POCBNP CBG: No results for input(s): GLUCAP in the last 168 hours. D-Dimer No results for input(s): DDIMER in the last 72 hours. Hgb A1c No results for input(s): HGBA1C in the last 72 hours. Lipid Profile No results for input(s): CHOL, HDL, LDLCALC, TRIG, CHOLHDL, LDLDIRECT in the last 72 hours. Thyroid function studies No results for input(s): TSH, T4TOTAL, T3FREE, THYROIDAB in the last 72 hours.  Invalid input(s): FREET3 Anemia work up No results for input(s): VITAMINB12, FOLATE, FERRITIN, TIBC, IRON, RETICCTPCT in the last 72 hours. Urinalysis    Component Value Date/Time   COLORURINE YELLOW 02/06/2016 1945   APPEARANCEUR CLEAR 02/06/2016 1945   LABSPEC 1.025 02/06/2016 1945   PHURINE 5.5 02/06/2016 1945   GLUCOSEU NEGATIVE 02/06/2016 1945   HGBUR NEGATIVE 02/06/2016 1945   [place] NEGATIVE 02/06/2016 1945   KETONESUR  NEGATIVE 02/06/2016 1945   PROTEINUR TRACE (A) 02/06/2016 1945   UROBILINOGEN 2.0 (H) 06/30/2014 1244   NITRITE NEGATIVE 02/06/2016 1945   LEUKOCYTESUR NEGATIVE 02/06/2016 1945   Sepsis Labs Invalid input(s): PROCALCITONIN,  WBC,  LACTICIDVEN Microbiology Recent Results (from the past 240 hour(s))  Urine culture     Status: None   Collection Time: 02/06/16  7:45 PM  Result Value Ref Range Status   Specimen Description URINE, CLEAN CATCH  Final   Special Requests NONE  Final   Culture NO GROWTH Performed at [facility]   Final   Report Status 02/08/2016 FINAL  Final  Blood culture (routine x 2)     Status: None (Preliminary result)   Collection Time: 02/06/16  8:48 PM  Result Value Ref Range Status   Specimen Description BLOOD LEFT ARM  Final   Special Requests BOTTLES DRAWN AEROBIC AND ANAEROBIC 6CC  Final   Culture NO GROWTH 4 DAYS  Final   Report Status PENDING  Incomplete  Blood culture (routine x 2)     Status: None (Preliminary result)   Collection Time: 02/06/16  8:58 PM  Result Value Ref Range Status   Specimen Description BLOOD LEFT ARM  Final   Special Requests BOTTLES DRAWN AEROBIC AND ANAEROBIC 6CC  Final   Culture NO GROWTH 4 DAYS  Final   Report Status PENDING  Incomplete     Time coordinating discharge: Over 30 minutes  SIGNED:   Birdie Hopes, MD  [facility] 02/10/2016, 10:22 AM Pager   If 7PM-7AM, please contact night-coverage [email or web address] Password [email or web address]

## 2016-02-10 NOTE — Progress Notes (Signed)
Patient transported to [facility].

## 2016-02-11 LAB — CULTURE, BLOOD (ROUTINE X 2)
Culture: NO GROWTH
Culture: NO GROWTH

## 2016-02-12 ENCOUNTER — Other Ambulatory Visit: Payer: Self-pay

## 2016-02-12 MED ORDER — HYDROCODONE-ACETAMINOPHEN 5-325 MG PO TABS: 1.0000 | ORAL_TABLET | Freq: Four times a day (QID) | ORAL | 0 refills | Status: DC | PRN

## 2016-02-12 NOTE — Telephone Encounter (Signed)
RX faxed to [facility] @ [phone]. Phone number [phone]  

## 2016-02-13 ENCOUNTER — Encounter: Payer: Self-pay | Admitting: Internal Medicine

## 2016-02-13 NOTE — Progress Notes (Signed)
Location:   [facility] Room Number: 132/P Place of Service:  SNF (31) Provider:  Amado Coe., MD  Patient Care Team: Redmond School, MD as PCP - General (Internal Medicine) [name], MD as Consulting Physician (Gastroenterology) Minus Breeding, MD as Consulting Physician (Cardiology)  Extended Emergency Contact Information Primary Emergency Contact: Arnette Schaumann [place] of [place] Phone: [phone] Relation: Daughter Secondary Emergency Contact: Knight,Janet  [place] Phone: [phone] Relation: Daughter  Code Status:  Living Will  Goals of care: Advanced Directive information Advanced Directives 02/06/2016  Does patient have an advance directive? Yes  Type of Advance Directive Living will  Does patient want to make changes to advanced directive? -  Copy of advanced directive(s) in chart? -  Would patient like information on creating an advanced directive? -  Pre-existing out of facility DNR order (yellow form or pink MOST form) -     Chief Complaint  Patient presents with  . Acute Visit    HPI:  Pt is a 81 y.o. female seen today for an acute visit for    Past Medical History:  Diagnosis Date  . AAA (abdominal aortic aneurysm) without rupture ([place]) 01/24/2016   4.3x4.0 cm per CT   . Atrial fibrillation ([place])   . Cerebrovascular disease   . COPD (chronic obstructive pulmonary disease) ([place])   . CVA 10/25/2008   Qualifier: Diagnosis of  By: [name], CNA, Christy    . DVT (deep venous thrombosis) ([place])   . GERD (gastroesophageal reflux disease)   . Hypertension   . Internal carotid artery stenosis 05/13/2012   HIGH GRADE LEFT INTERNAL CAROTID STENOSIS.  S/p  L CEA 05/11/12  . Irregular heart beat   . PAD (peripheral artery disease) ([facility])    lower extremity PAD with bilateral leg stenting  . TIA (transient ischemic attack)    Past Surgical History:  Procedure Laterality Date  .  APPENDECTOMY  1956  . CAROTID ANGIOGRAM N/A 05/06/2012   Procedure: CAROTID ANGIOGRAM;  Surgeon: [name], MD;  Location: [facility] CATH LAB;  Service: Cardiovascular;  Laterality: N/A;  . CAROTID ENDARTERECTOMY Left Nov. 4, 2013   CE  . CHOLECYSTECTOMY  1966   Gall Bladder  . COLONOSCOPY N/A 07/12/2014   [id number] cecal polyp/one colon polyp/redundant left colon  . ENDARTERECTOMY  05/11/2012   Procedure: ENDARTERECTOMY CAROTID;  Surgeon: [name], MD;  Location: [facility] OR;  Service: Vascular;  Laterality: Left;  . ESOPHAGOGASTRODUODENOSCOPY N/A 07/12/2014   [id number] in the lower thrid of the esophagus  . EYE SURGERY Left Dec. 22, 2016   Cataract  . GALLBLADDER SURGERY    . MALONEY DILATION N/A 07/12/2014   Procedure: MALONEY DILATION;  Surgeon: [name], MD;  Location: [facility];  Service: Endoscopy;  Laterality: N/A;  . PARTIAL HYSTERECTOMY    . PATCH ANGIOPLASTY  05/11/2012   Procedure: PATCH ANGIOPLASTY;  Surgeon: [name], MD;  Location[facility] OR;  Service: Vascular;  Laterality: Left;  with Dacron Patch Angioplasty  . SAVORY DILATION N/A 07/12/2014   Procedure: SAVORY DILATION;  Surgeon: [name], MD;  Location: [facility];  Service: Endoscopy;  Laterality: N/A;    Allergies  Allergen Reactions  . Ibuprofen     Gi upset  . Pnu-Imune [Pneumococcal Polysaccharide Vaccine]     Huge knot at site and rash  . Advair Diskus [Fluticasone-Salmeterol]     Confusion and shortness of breath  .  Albuterol     Confusion and shortness of breath  . Amoxicillin Hives  . Cephalexin Hives  . Codeine     confusion  . Entex Pac [Pseudoephedrine-Gg & Dm] Other (See Comments)    Upper respiratory.   . Etanercept     vertigo  . Gualenic Acid Other (See Comments)    unknown  . Lorazepam     hallucinations  . Nubain [Nalbuphine Hcl]   . Oxycodone Hcl Nausea And Vomiting  . Oxycodone-Acetaminophen Nausea And Vomiting  . Roxicodone [Oxycodone Hcl]   . Tramadol      hallucinations   . Tylox [Oxycodone-Acetaminophen]     Current Outpatient Prescriptions on File Prior to Visit  Medication Sig Dispense Refill  . cilostazol (PLETAL) 100 MG tablet Take 0.5 tablets (50 mg total) by mouth 2 (two) times daily. 30 tablet 6  . cyclobenzaprine (FLEXERIL) 5 MG tablet Take 1 tablet (5 mg total) by mouth 3 (three) times daily.    . dabigatran (PRADAXA) 150 MG CAPS capsule Take 150 mg by mouth 2 (two) times daily.    . hydrALAZINE (APRESOLINE) 10 MG tablet Take 1 tablet (10 mg total) by mouth 2 (two) times daily. New medication to help with treatment of your high blood pressure. 60 tablet 2  . HYDROcodone-acetaminophen (NORCO/VICODIN) 5-325 MG tablet Take 1 tablet by mouth every 6 (six) hours as needed for moderate pain. DO NOT EXCEED 4GM OF APAP IN 24 HOURS FROM ALL SOURCES 120 tablet 0  . levalbuterol (XOPENEX) 0.63 MG/3ML nebulizer solution Take 3 mLs (0.63 mg total) by nebulization every 4 (four) hours as needed for wheezing or shortness of breath.    . levofloxacin (LEVAQUIN) 750 MG tablet Take 1 tablet (750 mg total) by mouth every other day.    . lidocaine (LMX) 4 % cream Apply 1 application topically daily as needed (for pain (hips/back)).     . Multiple Vitamin (MULTIVITAMIN WITH MINERALS) TABS tablet Take 1 tablet by mouth daily.    . pantoprazole (PROTONIX) 40 MG tablet Take 1 tablet (40 mg total) by mouth daily. 30 tablet 6  . predniSONE (STERAPRED UNI-PAK 21 TAB) 10 MG (21) TBPK tablet Take 6-5-4-3-2-1 PO orally till gone 21 tablet 0  . senna-docusate (SENOKOT-S) 8.6-50 MG tablet Take 2 tablets by mouth at bedtime. For constipation    . tiotropium (SPIRIVA) 18 MCG inhalation capsule Place 18 mcg into inhaler and inhale at bedtime.    . verapamil (VERELAN PM) 240 MG 24 hr capsule Take 240 mg by mouth daily.     No current facility-administered medications on file prior to visit.     Review of Systems  Immunization History  Administered Date(s)  Administered  . Influenza Split 05/03/2012  . Influenza,inj,Quad PF,36+ Mos 04/18/2014  . Influenza-Unspecified 05/08/2013   Pertinent  Health Maintenance Due  Topic Date Due  . DEXA SCAN  11/14/2001  . PNA vac Low Risk Adult (1 of 2 - PCV13) 11/14/2001  . INFLUENZA VACCINE  02/06/2016   No flowsheet data found. Functional Status Survey:    There were no vitals filed for this visit. There is no height or weight on file to calculate BMI. Physical Exam  Labs reviewed:  Recent Labs  01/25/16 1711  01/27/16 0500 02/06/16 2020 02/09/16 0550  NA 131*  < > 130* 142 135  K 4.8  < > 3.9 3.4* 5.2*  CL 98*  < > 90* 102 99*  CO2 26  < > 31 32 31  GLUCOSE 240*  < > 112* 121* 152*  BUN 26*  < > 15 21* 40*  CREATININE 0.63  < > 0.45 0.51 0.47  CALCIUM 8.6*  < > 8.3* 8.7* 8.3*  MG 1.8  --   --   --   --   < > = values in this interval not displayed.  Recent Labs  02/06/16 2020  AST 19  ALT 19  ALKPHOS 124  BILITOT 0.6  PROT 5.8*  ALBUMIN 3.2*    Recent Labs  01/24/16 0640  01/26/16 0535 01/27/16 0500 02/06/16 2020  WBC 7.7  < > 12.8* 10.8* 10.1  NEUTROABS 5.4  --   --   --  8.0*  HGB 11.7*  < > 9.8* 10.7* 10.9*  HCT 35.6*  < > 29.3* 32.0* 33.5*  MCV 81.5  < > 81.2 80.8 84.0  PLT 203  < > 217 225 285  < > = values in this interval not displayed. Lab Results  Component Value Date   TSH 2.775 01/25/2016   Lab Results  Component Value Date   HGBA1C 6.1 (H) 05/03/2012   Lab Results  Component Value Date   CHOL 186 05/03/2012   HDL 71 05/03/2012   LDLCALC 101 (H) 05/03/2012   TRIG 72 05/03/2012   CHOLHDL 2.6 05/03/2012    Significant Diagnostic Results in last 30 days:  Dg Lumbar Spine Complete  Result Date: 01/24/2016 CLINICAL DATA:  Status post trip and fall this morning with onset of low back pain. Initial encounter. EXAM: LUMBAR SPINE - COMPLETE 4+ VIEW COMPARISON:  CT abdomen and pelvis 06/30/2014. FINDINGS: There is no fracture. Trace anterolisthesis  L5 on S1 due to facet arthropathy is unchanged. Intervertebral disc space height is maintained. The patient has an abdominal aortic aneurysm measuring 4.4 cm AP, increased from approximately 3.1 cm on the prior examination. Large colonic stool burden is noted. IMPRESSION: No acute abnormality. 4.4 cm abdominal aortic aneurysm is increased from 3.1 cm on the prior exam. Recommend follow-up ultrasound in 1 year. This recommendation follows ACR consensus guidelines. Large colonic stool burden. Electronically Signed   By: Inge Rise M.D.   On: 01/24/2016 07:12   Dg Pelvis 1-2 Views  Result Date: 01/24/2016 CLINICAL DATA:  Status post trip and fall this morning with onset of pelvic pain. Initial encounter. EXAM: PELVIS - 1-2 VIEW COMPARISON:  CT abdomen and pelvis 06/30/2014. FINDINGS: There is no evidence of pelvic fracture or diastasis. No pelvic bone lesions are seen. Bones are osteopenic. Large colonic stool burden is noted. IMPRESSION: No acute abnormality. Osteopenia. Large colonic stool burden. Electronically Signed   By: Inge Rise M.D.   On: 01/24/2016 07:13   Ct Lumbar Spine Wo Contrast  Result Date: 01/24/2016 CLINICAL DATA:  Lumbago.  Fall earlier today EXAM: CT LUMBAR SPINE WITHOUT CONTRAST TECHNIQUE: Multidetector CT imaging of the lumbar spine was performed without intravenous contrast administration. Multiplanar CT image reconstructions were also generated. COMPARISON:  CT abdomen and pelvis with bony reformats June 30, 2014; lumbar radiographs January 24, 2016 FINDINGS: Bones are osteoporotic. There is no evidence of acute fracture or spondylolisthesis. There is slight disc space narrowing at T12-L1. Other disc spaces appear unremarkable. At T12-L1, there is mild generalized disc bulging. There is mild facet hypertrophy bilaterally. There is no nerve root edema or effacement. No disc extrusion or stenosis. At L1-2, there is moderate facet hypertrophy bilaterally. There is mild  diffuse disc bulging. There is no nerve root edema or effacement.  No disc extrusion or stenosis. At L2-3, there is moderate facet hypertrophy bilaterally and mild diffuse disc bulging. No nerve root edema or effacement. No disc extrusion or stenosis. At L3-4, there is moderate facet hypertrophy bilaterally and mild diffuse disc bulging. No nerve root edema or effacement. No disc extrusion or stenosis. At L4-5, there is a broad-based disc bulging and moderate facet hypertrophy bilaterally. There is mild ligamentum flava hypertrophy bilaterally causing borderline lateral canal stenosis and L4-5 bilaterally. No central canal stenosis noted. No disc extrusion. No nerve root edema or effacement evident. At L5-S1, there is moderate facet hypertrophy bilaterally. There is broad-based disc protrusion without appreciable nerve root edema or effacement. Node disc extrusion or stenosis evident. There is stable calcification between the L2 and L3 spinous processes, possibly residua of old trauma. There is slight lumbar levoscoliosis. There is degenerative change in the sacroiliac joints bilaterally, slightly more severe on the right than on the left. There is extensive calcification in the distal aorta and common iliac arteries. There is aneurysmal dilatation of the distal abdominal aorta with a maximum transverse diameter of 4.3 x 4.0 cm. This aneurysm extends to just above the bifurcation. It arises inferior to the renal arteries. There is no periaortic fluid. IMPRESSION: No fracture or spondylolisthesis. Multilevel arthropathy. Borderline lateral canal stenosis at L4-5, multifactorial. No central stenosis. No disc extrusion or nerve root edema/effacement. Abdominal aortic aneurysm with maximum transverse diameter of 4.3 x 4.0 cm. Recommend followup by ultrasound in 1 year. This recommendation follows ACR consensus guidelines: White Paper of the ACR Incidental Findings Committee II on Vascular Findings. J Am Coll Radiol 2013;  10:789-794. As this aneurysm has increased in size compared to prior study from December 2015, vascular surgery consultation may also be advisable at this time. Bones osteoporotic. Electronically Signed   By: Lowella Grip III M.D.   On: 01/24/2016 10:14   Dg Chest Portable 1 View  Result Date: 02/06/2016 CLINICAL DATA:  81 year old female with increasing cough and pain. Initial encounter. EXAM: PORTABLE CHEST 1 VIEW COMPARISON:  11/08/2014 and earlier. FINDINGS: Portable AP semi upright view at 1951 hours. Chronic pulmonary hyperinflation. Patchy increased bibasilar opacity slightly greater on the right. No pneumothorax or pulmonary edema. No definite pleural effusion. Stable cardiac size and mediastinal contours. Ir calcified aortic atherosclerosis. Visualized tracheal air column is within normal limits. IMPRESSION: 1. chronic hyperinflation with patchy acute bibasilar opacity. Favor acute infectious exacerbation. No definite pleural effusion. 2. Calcified aortic atherosclerosis. Electronically Signed   By: Genevie Ann M.D.   On: 02/06/2016 20:12   Dg Hips Bilat With Pelvis Min 5 Views  Result Date: 02/06/2016 CLINICAL DATA:  Fall at home 01/24/2016 (2 weeks prior), persistent bilateral hip pain since fall. EXAM: DG HIP (WITH OR WITHOUT PELVIS) 5+V BILAT COMPARISON:  Pelvis radiographs 01/24/2016 FINDINGS: The cortical margins of the bony pelvis and both hips are intact. No acute or healing fracture. Pubic symphysis and sacroiliac joints are congruent. Both femoral heads are well-seated in the respective acetabula. Bony under mineralization again seen. Large stool burden. Atherosclerosis and vascular stents. IMPRESSION: 1. No pelvic fracture. 2. Osteopenia. Electronically Signed   By: Jeb Levering M.D.   On: 02/06/2016 22:54    Assessment/Plan There are no diagnoses linked to this encounter.      Oralia Manis, [place]

## 2016-02-14 ENCOUNTER — Non-Acute Institutional Stay (SKILLED_NURSING_FACILITY): Payer: Medicare Other | Admitting: Internal Medicine

## 2016-02-14 ENCOUNTER — Encounter (HOSPITAL_COMMUNITY): Payer: Self-pay

## 2016-02-14 ENCOUNTER — Emergency Department (HOSPITAL_COMMUNITY): Payer: Medicare Other

## 2016-02-14 ENCOUNTER — Encounter: Payer: Self-pay | Admitting: Internal Medicine

## 2016-02-14 ENCOUNTER — Emergency Department (HOSPITAL_COMMUNITY)
Admission: EM | Admit: 2016-02-14 | Discharge: 2016-02-14 | Disposition: A | Discharge status: Home / Self Care | Payer: Medicare Other | Attending: Emergency Medicine | Admitting: Emergency Medicine

## 2016-02-14 DIAGNOSIS — Z87891 Personal history of nicotine dependence: Secondary | ICD-10-CM | POA: Diagnosis not present

## 2016-02-14 DIAGNOSIS — Z79891 Long term (current) use of opiate analgesic: Secondary | ICD-10-CM | POA: Diagnosis not present

## 2016-02-14 DIAGNOSIS — R5383 Other fatigue: Secondary | ICD-10-CM

## 2016-02-14 DIAGNOSIS — J9601 Acute respiratory failure with hypoxia: Secondary | ICD-10-CM

## 2016-02-14 DIAGNOSIS — Z79899 Other long term (current) drug therapy: Secondary | ICD-10-CM | POA: Diagnosis not present

## 2016-02-14 DIAGNOSIS — I10 Essential (primary) hypertension: Secondary | ICD-10-CM | POA: Diagnosis not present

## 2016-02-14 DIAGNOSIS — R0602 Shortness of breath: Secondary | ICD-10-CM | POA: Insufficient documentation

## 2016-02-14 DIAGNOSIS — I482 Chronic atrial fibrillation, unspecified: Secondary | ICD-10-CM

## 2016-02-14 DIAGNOSIS — R000 Tachycardia, unspecified: Secondary | ICD-10-CM | POA: Diagnosis not present

## 2016-02-14 DIAGNOSIS — J441 Chronic obstructive pulmonary disease with (acute) exacerbation: Secondary | ICD-10-CM | POA: Diagnosis not present

## 2016-02-14 DIAGNOSIS — R05 Cough: Secondary | ICD-10-CM

## 2016-02-14 DIAGNOSIS — J90 Pleural effusion, not elsewhere classified: Secondary | ICD-10-CM | POA: Diagnosis not present

## 2016-02-14 DIAGNOSIS — R059 Cough, unspecified: Secondary | ICD-10-CM

## 2016-02-14 LAB — BLOOD GAS, ARTERIAL
Acid-Base Excess: 3 mmol/L — ABNORMAL HIGH (ref 0.0–2.0)
BICARBONATE: 27.6 meq/L — AB (ref 20.0–24.0)
Drawn by: 277331
O2 CONTENT: 2 L/min
O2 SAT: 95.5 %
PATIENT TEMPERATURE: 37
PCO2 ART: 32 mmHg — AB (ref 35.0–45.0)
PO2 ART: 77.2 mmHg — AB (ref 80.0–100.0)
pH, Arterial: 7.518 — ABNORMAL HIGH (ref 7.350–7.450)

## 2016-02-14 LAB — URINALYSIS, ROUTINE W REFLEX MICROSCOPIC
Bilirubin Urine: NEGATIVE
GLUCOSE, UA: NEGATIVE mg/dL
KETONES UR: NEGATIVE mg/dL
LEUKOCYTES UA: NEGATIVE
Nitrite: NEGATIVE
PH: 7 (ref 5.0–8.0)
PROTEIN: NEGATIVE mg/dL
Specific Gravity, Urine: 1.02 (ref 1.005–1.030)

## 2016-02-14 LAB — URINE MICROSCOPIC-ADD ON

## 2016-02-14 LAB — COMPREHENSIVE METABOLIC PANEL
ALK PHOS: 165 U/L — AB (ref 38–126)
ALT: 42 U/L (ref 14–54)
AST: 23 U/L (ref 15–41)
Albumin: 3.8 g/dL (ref 3.5–5.0)
Anion gap: 7 (ref 5–15)
BUN: 24 mg/dL — ABNORMAL HIGH (ref 6–20)
CALCIUM: 8.2 mg/dL — AB (ref 8.9–10.3)
CO2: 28 mmol/L (ref 22–32)
CREATININE: 0.57 mg/dL (ref 0.44–1.00)
Chloride: 96 mmol/L — ABNORMAL LOW (ref 101–111)
GFR calc non Af Amer: >60 mL/min (ref >60)
GLUCOSE: 203 mg/dL — AB (ref 65–99)
Potassium: 3.2 mmol/L — ABNORMAL LOW (ref 3.5–5.1)
SODIUM: 131 mmol/L — AB (ref 135–145)
Total Bilirubin: 1.5 mg/dL — ABNORMAL HIGH (ref 0.3–1.2)
Total Protein: 6.6 g/dL (ref 6.5–8.1)

## 2016-02-14 LAB — CBC WITH DIFFERENTIAL/PLATELET
BASOS ABS: 0 10*3/uL (ref 0.0–0.1)
BASOS PCT: 0 %
EOS ABS: 0 10*3/uL (ref 0.0–0.7)
Eosinophils Relative: 0 %
HCT: 39.9 % (ref 36.0–46.0)
Hemoglobin: 13.4 g/dL (ref 12.0–15.0)
Lymphocytes Relative: 3 %
Lymphs Abs: 0.3 10*3/uL — ABNORMAL LOW (ref 0.7–4.0)
MCH: 27.4 pg (ref 26.0–34.0)
MCHC: 33.6 g/dL (ref 30.0–36.0)
MCV: 81.6 fL (ref 78.0–100.0)
MONO ABS: 0.7 10*3/uL (ref 0.1–1.0)
MONOS PCT: 6 %
NEUTROS PCT: 91 %
Neutro Abs: 10.5 10*3/uL — ABNORMAL HIGH (ref 1.7–7.7)
PLATELETS: 302 10*3/uL (ref 150–400)
RBC: 4.89 MIL/uL (ref 3.87–5.11)
RDW: 17.7 % — AB (ref 11.5–15.5)
WBC: 11.4 10*3/uL — ABNORMAL HIGH (ref 4.0–10.5)

## 2016-02-14 LAB — TROPONIN I

## 2016-02-14 MED ORDER — LEVALBUTEROL HCL 1.25 MG/0.5ML IN NEBU
1.2500 mg | INHALATION_SOLUTION | Freq: Once | RESPIRATORY_TRACT | Status: AC
  Administered 2016-02-14: 1.25 mg via RESPIRATORY_TRACT
  Filled 2016-02-14: qty 0.5

## 2016-02-14 MED ORDER — METHYLPREDNISOLONE SODIUM SUCC 125 MG IJ SOLR
125.0000 mg | Freq: Once | INTRAMUSCULAR | Status: AC
  Administered 2016-02-14: 125 mg via INTRAVENOUS
  Filled 2016-02-14: qty 2

## 2016-02-14 MED ORDER — DILTIAZEM HCL 25 MG/5ML IV SOLN
20.0000 mg | Freq: Once | INTRAVENOUS | Status: AC
  Administered 2016-02-14: 20 mg via INTRAVENOUS
  Filled 2016-02-14: qty 5

## 2016-02-14 MED ORDER — SODIUM CHLORIDE 0.9 % IV BOLUS (SEPSIS)
1000.0000 mL | Freq: Once | INTRAVENOUS | Status: AC
  Administered 2016-02-14: 1000 mL via INTRAVENOUS

## 2016-02-14 MED ORDER — TIOTROPIUM BROMIDE MONOHYDRATE 18 MCG IN CAPS: 18.0000 ug | ORAL_CAPSULE | Freq: Every day | RESPIRATORY_TRACT | 0 refills | Status: DC

## 2016-02-14 MED ORDER — IOPAMIDOL (ISOVUE-300) INJECTION 61%
75.0000 mL | Freq: Once | INTRAVENOUS | Status: AC | PRN
  Administered 2016-02-14: 75 mL via INTRAVENOUS

## 2016-02-14 NOTE — ED Notes (Signed)
Pt's linens and brief changed.

## 2016-02-14 NOTE — ED Notes (Signed)
Pt to CT at this time.

## 2016-02-14 NOTE — ED Triage Notes (Signed)
Pt here from the [facility] for evaluation of irregular heart beat. Pt complaining of pain in both feet and ankles

## 2016-02-14 NOTE — ED Notes (Signed)
Respiratory paged

## 2016-02-14 NOTE — Progress Notes (Signed)
Location:   [place] Room Number: 132/P Place of Service:  SNF (31) Provider:  Amado Coe., MD  Patient Care Team: Redmond School, MD as PCP - General (Internal Medicine) [name], MD as Consulting Physician (Gastroenterology) Minus Breeding, MD as Consulting Physician (Cardiology)  Extended Emergency Contact Information Primary Emergency Contact: Arnette Schaumann [place] of [place] Phone: [phone] Relation: Daughter Secondary Emergency Contact: Knight,Janet  [place] Phone: [phone] Relation: Daughter  Code Status:  Full Code Goals of care: Advanced Directive information Advanced Directives 02/14/2016  Does patient have an advance directive? Yes  Type of Advance Directive Living will  Does patient want to make changes to advanced directive? No - Patient declined  Copy of advanced directive(s) in chart? Yes  Would patient like information on creating an advanced directive? -  Pre-existing out of facility DNR order (yellow form or pink MOST form) -     Chief Complaint  Patient presents with  . Acute Visit   Secondary to tachycardia-hypoxia--lethargy HPI:  Pt is a 81 y.o. female seen today for an acute visit for  Increased lethargy with hypoxia and tachycardia.  Patient was recently discharged from the hospital with numerous diagnoses including chronic pain-treatment for pneumonia-as well as atrial fibrillation  She is completing a course of Levaquin and also is on a prednisone taper.  Also orders to continue supportive management with bronchodilators and antitussives and oxygen as needed.  She does have a history of COPD.  Currently she is more lethargic according to nursing staff-there is also concern with hypoxia she is on 3 L of oxygen now and is stabbing in the mid 80s-she is also tachycardic-machine actually said a pulse at  140 however one I reevaluated pulse was between  105 210.  She is responding to verbal stimuli now but says she is having generalized pain more so in her legs.  She is not complaining of chest pain or acute shortness of breath although she appears to be having some hypoxia.    Past Medical History:  Diagnosis Date  . AAA (abdominal aortic aneurysm) without rupture ([place]) 01/24/2016   4.3x4.0 cm per CT   . Atrial fibrillation ([place])   . Cerebrovascular disease   . COPD (chronic obstructive pulmonary disease) ([place])   . CVA 10/25/2008   Qualifier: Diagnosis of  By: [name], CNA, Christy    . DVT (deep venous thrombosis) ([place])   . GERD (gastroesophageal reflux disease)   . Hypertension   . Internal carotid artery stenosis 05/13/2012   HIGH GRADE LEFT INTERNAL CAROTID STENOSIS.  S/p  L CEA 05/11/12  . Irregular heart beat   . PAD (peripheral artery disease) ([facility])    lower extremity PAD with bilateral leg stenting  . TIA (transient ischemic attack)    Past Surgical History:  Procedure Laterality Date  . APPENDECTOMY  1956  . CAROTID ANGIOGRAM N/A 05/06/2012   Procedure: CAROTID ANGIOGRAM;  Surgeon: [name], MD;  Location: [facility] C[facility] LAB;  Service: Cardiovascular;  Laterality: N/A;  . CAROTID ENDARTERECTOMY Left Nov. 4, 2013   CE  . CHOLECYSTECTOMY  1966   Gall Bladder  . COLONOSCOPY N/A 07/12/2014   [id number] cecal polyp/one colon polyp/redundant left colon  . ENDARTERECTOMY  05/11/2012   Procedure: ENDARTERECTOMY CAROTID;  Surgeon: [name], MD;  Location: [place];  Service: Vascular;  Laterality: Left;  . ESOPHAGOGASTRODUODENOSCOPY N/A 07/12/2014   [id number] in the lower thrid  of the esophagus  . EYE SURGERY Left Dec. 22, 2016   Cataract  . GALLBLADDER SURGERY    . MALONEY DILATION N/A 07/12/2014   Procedure: MALONEY DILATION;  Surgeon: [name], MD;  [facility];  Service: Endoscopy;  Laterality: N/A;  . PARTIAL HYSTERECTOMY    . PATCH ANGIOPLASTY  05/11/2012   Procedure: PATCH ANGIOPLASTY;  Surgeon:  [name], MD;  [facility];  Service: Vascular;  Laterality: Left;  with Dacron Patch Angioplasty  . SAVORY DILATION N/A 07/12/2014   Procedure: SAVORY DILATION;  Surgeon: [name], MD;  Location[facility];  Service: Endoscopy;  Laterality: N/A;    Allergies  Allergen Reactions  . Ibuprofen     Gi upset  . Pnu-Imune [Pneumococcal Polysaccharide Vaccine]     Huge knot at site and rash  . Advair Diskus [Fluticasone-Salmeterol]     Confusion and shortness of breath  . Albuterol     Confusion and shortness of breath  . Amoxicillin Hives  . Cephalexin Hives  . Codeine     confusion  . Entex Pac [Pseudoephedrine-Gg & Dm] Other (See Comments)    Upper respiratory.   . Etanercept     vertigo  . Gualenic Acid Other (See Comments)    unknown  . Lorazepam     hallucinations  . Nubain [Nalbuphine Hcl]   . Oxycodone Hcl Nausea And Vomiting  . Oxycodone-Acetaminophen Nausea And Vomiting  . Roxicodone [Oxycodone Hcl]   . Tramadol     hallucinations   . Tylox [Oxycodone-Acetaminophen]     Current Outpatient Prescriptions on File Prior to Visit  Medication Sig Dispense Refill  . acetaminophen (TYLENOL) 325 MG tablet Take 650 mg by mouth every 4 (four) hours as needed.    . cilostazol (PLETAL) 100 MG tablet Take 0.5 tablets (50 mg total) by mouth 2 (two) times daily. 30 tablet 6  . cyclobenzaprine (FLEXERIL) 5 MG tablet Take 1 tablet (5 mg total) by mouth 3 (three) times daily.    . dabigatran (PRADAXA) 150 MG CAPS capsule Take 150 mg by mouth 2 (two) times daily.    . hydrALAZINE (APRESOLINE) 10 MG tablet Take 1 tablet (10 mg total) by mouth 2 (two) times daily. New medication to help with treatment of your high blood pressure. 60 tablet 2  . HYDROcodone-acetaminophen (NORCO/VICODIN) 5-325 MG tablet Take 1 tablet by mouth every 6 (six) hours as needed for moderate pain. DO NOT EXCEED 4GM OF APAP IN 24 HOURS FROM ALL SOURCES 120 tablet 0  . levalbuterol (XOPENEX) 0.63  MG/3ML nebulizer solution Take 3 mLs (0.63 mg total) by nebulization every 4 (four) hours as needed for wheezing or shortness of breath.    . levofloxacin (LEVAQUIN) 750 MG tablet Take 1 tablet (750 mg total) by mouth every other day.    . lidocaine (LIDODERM) 5 % Place 1 patch onto the skin daily. Remove & Discard patch within 12 hours or as directed by MD    . lidocaine (LMX) 4 % cream Apply 1 application topically daily as needed (for pain (hips/back)).     . Multiple Vitamin (MULTIVITAMIN WITH MINERALS) TABS tablet Take 1 tablet by mouth daily.    . OXYGEN 3 L/mn as needed every shift    . pantoprazole (PROTONIX) 40 MG tablet Take 1 tablet (40 mg total) by mouth daily. 30 tablet 6  . predniSONE (STERAPRED UNI-PAK 21 TAB) 10 MG (21) TBPK tablet Take 6-5-4-3-2-1 PO orally till  gone 21 tablet 0  . senna-docusate (SENOKOT-S) 8.6-50 MG tablet Take 2 tablets by mouth at bedtime. For constipation    . tiotropium (SPIRIVA) 18 MCG inhalation capsule Place 18 mcg into inhaler and inhale at bedtime.    . verapamil (VERELAN PM) 240 MG 24 hr capsule Take 240 mg by mouth daily.     No current facility-administered medications on file prior to visit.      Review of Systems   This is limited secondary to patient not speaking much.  Provided from nursing as well.  In general is not complaining of a fever chills.  Skin does not complain of rashes or itching.  Or diaphoresis.  Respiratory-if she is short of breath says no but she appears to have some mild use of her accessory muscles and diffuse congestion.  Heart is tachycardic with a history of A. fib does not complain of chest pain however.  GI does not complaining of abdominal pain no nausea or vomiting has been noted.  Muscle skeletal see has significant pain more so in her legs.  Neurologic is not complaining dizziness headache however staff has noted increased lethargy.  Psych-she is alert at this point although apparently initially  somewhat lethargic she is grossly oriented will respond to verbal questions.    Immunization History  Administered Date(s) Administered  . Influenza Split 05/03/2012  . Influenza,inj,Quad PF,36+ Mos 04/18/2014  . Influenza-Unspecified 05/08/2013   Pertinent  Health Maintenance Due  Topic Date Due  . DEXA SCAN  11/14/2001  . PNA vac Low Risk Adult (1 of 2 - PCV13) 11/14/2001  . INFLUENZA VACCINE  12020-04-1216 (Originally 02/06/2016)   No flowsheet data found. Functional Status Survey:    Vitals:   02/14/16 1313  BP: (!) 154/93  Pulse: (!) 105  Resp: (!) 22  SpO2: (!) 85%   There is no height or weight on file to calculate BMI. Physical Exam   In general this is a frail elderly female does not appear in acute distress but does appear be uncomfortable.  Her skin is warm and dry.  Eyes sclera and conjunctiva are clear pupils appear to be reactive to light visual acuity appears grossly intact.  Oropharynx is clear mucous membranes are somewhat dry  Chest she has diffuse congested breath sounds that are coarse on inspiration and expiration.--Some mild use of accessory muscles  Heart is regular irregular rate and rhythm with a pulse ranging from 100-120 per serial exams.  She does not have significant lower extremity edema.  Abdomen is soft does not appear to be tender positive bowel sounds.  Musculoskeletal is able to move all extremities with significant lower extremity weakness in general frailty and muscle wasting I do not note any lateralizing findings.  Neurologic as noted above could not appreciate lateralizing findings her speech is clear cranial nerves appear grossly intact.  Psych again this difficult to fully says since she she is not speaking much but she appears grossly oriented does respond appropriately to direct verbal questions.    Labs reviewed:  Recent Labs  01/25/16 1711  01/27/16 0500 02/06/16 2020 02/09/16 0550  NA 131*  < > 130* 142 135  K  4.8  < > 3.9 3.4* 5.2*  CL 98*  < > 90* 102 99*  CO2 26  < > 31 32 31  GLUCOSE 240*  < > 112* 121* 152*  BUN 26*  < > 15 21* 40*  CREATININE 0.63  < > 0.45 0.51 0.47  CALCIUM 8.6*  < > 8.3* 8.7* 8.3*  MG 1.8  --   --   --   --   < > = values in this interval not displayed.  Recent Labs  02/06/16 2020  AST 19  ALT 19  ALKPHOS 124  BILITOT 0.6  PROT 5.8*  ALBUMIN 3.2*    Recent Labs  01/24/16 0640  01/26/16 0535 01/27/16 0500 02/06/16 2020  WBC 7.7  < > 12.8* 10.8* 10.1  NEUTROABS 5.4  --   --   --  8.0*  HGB 11.7*  < > 9.8* 10.7* 10.9*  HCT 35.6*  < > 29.3* 32.0* 33.5*  MCV 81.5  < > 81.2 80.8 84.0  PLT 203  < > 217 225 285  < > = values in this interval not displayed. Lab Results  Component Value Date   TSH 2.775 01/25/2016   Lab Results  Component Value Date   HGBA1C 6.1 (H) 05/03/2012   Lab Results  Component Value Date   CHOL 186 05/03/2012   HDL 71 05/03/2012   LDLCALC 101 (H) 05/03/2012   TRIG 72 05/03/2012   CHOLHDL 2.6 05/03/2012    Significant Diagnostic Results in last 30 days:  Dg Lumbar Spine Complete  Result Date: 01/24/2016 CLINICAL DATA:  Status post trip and fall this morning with onset of low back pain. Initial encounter. EXAM: LUMBAR SPINE - COMPLETE 4+ VIEW COMPARISON:  CT abdomen and pelvis 06/30/2014. FINDINGS: There is no fracture. Trace anterolisthesis L5 on S1 due to facet arthropathy is unchanged. Intervertebral disc space height is maintained. The patient has an abdominal aortic aneurysm measuring 4.4 cm AP, increased from approximately 3.1 cm on the prior examination. Large colonic stool burden is noted. IMPRESSION: No acute abnormality. 4.4 cm abdominal aortic aneurysm is increased from 3.1 cm on the prior exam. Recommend follow-up ultrasound in 1 year. This recommendation follows ACR consensus guidelines. Large colonic stool burden. Electronically Signed   By: Inge Rise M.D.   On: 01/24/2016 07:12   Dg Pelvis 1-2  Views  Result Date: 01/24/2016 CLINICAL DATA:  Status post trip and fall this morning with onset of pelvic pain. Initial encounter. EXAM: PELVIS - 1-2 VIEW COMPARISON:  CT abdomen and pelvis 06/30/2014. FINDINGS: There is no evidence of pelvic fracture or diastasis. No pelvic bone lesions are seen. Bones are osteopenic. Large colonic stool burden is noted. IMPRESSION: No acute abnormality. Osteopenia. Large colonic stool burden. Electronically Signed   By: Inge Rise M.D.   On: 01/24/2016 07:13   Ct Lumbar Spine Wo Contrast  Result Date: 01/24/2016 CLINICAL DATA:  Lumbago.  Fall earlier today EXAM: CT LUMBAR SPINE WITHOUT CONTRAST TECHNIQUE: Multidetector CT imaging of the lumbar spine was performed without intravenous contrast administration. Multiplanar CT image reconstructions were also generated. COMPARISON:  CT abdomen and pelvis with bony reformats June 30, 2014; lumbar radiographs January 24, 2016 FINDINGS: Bones are osteoporotic. There is no evidence of acute fracture or spondylolisthesis. There is slight disc space narrowing at T12-L1. Other disc spaces appear unremarkable. At T12-L1, there is mild generalized disc bulging. There is mild facet hypertrophy bilaterally. There is no nerve root edema or effacement. No disc extrusion or stenosis. At L1-2, there is moderate facet hypertrophy bilaterally. There is mild diffuse disc bulging. There is no nerve root edema or effacement. No disc extrusion or stenosis. At L2-3, there is moderate facet hypertrophy bilaterally and mild diffuse disc bulging. No nerve root edema or effacement. No disc extrusion or  stenosis. At L3-4, there is moderate facet hypertrophy bilaterally and mild diffuse disc bulging. No nerve root edema or effacement. No disc extrusion or stenosis. At L4-5, there is a broad-based disc bulging and moderate facet hypertrophy bilaterally. There is mild ligamentum flava hypertrophy bilaterally causing borderline lateral canal stenosis  and L4-5 bilaterally. No central canal stenosis noted. No disc extrusion. No nerve root edema or effacement evident. At L5-S1, there is moderate facet hypertrophy bilaterally. There is broad-based disc protrusion without appreciable nerve root edema or effacement. Node disc extrusion or stenosis evident. There is stable calcification between the L2 and L3 spinous processes, possibly residua of old trauma. There is slight lumbar levoscoliosis. There is degenerative change in the sacroiliac joints bilaterally, slightly more severe on the right than on the left. There is extensive calcification in the distal aorta and common iliac arteries. There is aneurysmal dilatation of the distal abdominal aorta with a maximum transverse diameter of 4.3 x 4.0 cm. This aneurysm extends to just above the bifurcation. It arises inferior to the renal arteries. There is no periaortic fluid. IMPRESSION: No fracture or spondylolisthesis. Multilevel arthropathy. Borderline lateral canal stenosis at L4-5, multifactorial. No central stenosis. No disc extrusion or nerve root edema/effacement. Abdominal aortic aneurysm with maximum transverse diameter of 4.3 x 4.0 cm. Recommend followup by ultrasound in 1 year. This recommendation follows ACR consensus guidelines: White Paper of the ACR Incidental Findings Committee II on Vascular Findings. J Am Coll Radiol 2013; 10:789-794. As this aneurysm has increased in size compared to prior study from December 2015, vascular surgery consultation may also be advisable at this time. Bones osteoporotic. Electronically Signed   By: Lowella Grip III M.D.   On: 01/24/2016 10:14   Dg Chest Portable 1 View  Result Date: 02/06/2016 CLINICAL DATA:  81 year old female with increasing cough and pain. Initial encounter. EXAM: PORTABLE CHEST 1 VIEW COMPARISON:  11/08/2014 and earlier. FINDINGS: Portable AP semi upright view at 1951 hours. Chronic pulmonary hyperinflation. Patchy increased bibasilar opacity  slightly greater on the right. No pneumothorax or pulmonary edema. No definite pleural effusion. Stable cardiac size and mediastinal contours. Ir calcified aortic atherosclerosis. Visualized tracheal air column is within normal limits. IMPRESSION: 1. chronic hyperinflation with patchy acute bibasilar opacity. Favor acute infectious exacerbation. No definite pleural effusion. 2. Calcified aortic atherosclerosis. Electronically Signed   By: Genevie Ann M.D.   On: 02/06/2016 20:12   Dg Hips Bilat With Pelvis Min 5 Views  Result Date: 02/06/2016 CLINICAL DATA:  Fall at home 01/24/2016 (2 weeks prior), persistent bilateral hip pain since fall. EXAM: DG HIP (WITH OR WITHOUT PELVIS) 5+V BILAT COMPARISON:  Pelvis radiographs 01/24/2016 FINDINGS: The cortical margins of the bony pelvis and both hips are intact. No acute or healing fracture. Pubic symphysis and sacroiliac joints are congruent. Both femoral heads are well-seated in the respective acetabula. Bony under mineralization again seen. Large stool burden. Atherosclerosis and vascular stents. IMPRESSION: 1. No pelvic fracture. 2. Osteopenia. Electronically Signed   By: Jeb Levering M.D.   On: 02/06/2016 22:54    Assessment/Plan 1 hypoxia with history of increased lethargy and tachycardia-they could be numerous etiologies she appears to be in some mild distress-will send her to the ER there could be numerous etiologies with her history of pneumonia-as well as atrial fibrillation-one would be concerned possibly with the lethargy of an elevated CO2 level as well.  We will wait expedient emergency room evaluation-.  Before EMS arrived patient was reevaluated in appear to be stable  actually somewhat more alert but would like to rule out an acute process here with patient's numerous comorbidities   [id number]      Caroline [name], [place]

## 2016-02-14 NOTE — ED Notes (Signed)
Two unsuccessful IV attempts made by S. Suly Vukelich.

## 2016-02-14 NOTE — ED Notes (Signed)
Pt is able to identify family member at bedside correctly, states name, and place correctly.

## 2016-02-14 NOTE — ED Provider Notes (Signed)
[facility] DEPT Provider Note   CSN: [id number] Arrival date & time: 02/14/16  1330  First Provider Contact:   First MD Initiated Contact with Patient 02/14/16 1335     By signing my name below, I, Rayna Sexton, attest that this documentation has been prepared under the direction and in the presence of Isla Pence, MD. Electronically Signed: Rayna Sexton, ED Scribe. 02/14/16. 1:42 PM.   History   Chief Complaint Chief Complaint  Patient presents with  . Leg Pain    irregular heart beat   LEVEL 5 CAVEAT: ACUITY OF CONDITION   HPI Comments: Caroline [name] is a 81 y.o. female with a significant medical history listed below who presents to the Emergency Department by ambulance from the [facility] for evaluation of possible atrial fibrillation. Pt states she wears home O2. She initially denied any pain and now reports pain in her bilateral legs. Pt speaks softly and slowly with long delays in response to the provider's questions.   Pt has a hx of a.fib (CHADVASC score of 7) and was recently discharged (8/5) with CAP.  She has been on levaquin.  The pt also recently fell and had ct scan of back and xrays hips that were nl.  Pt still c/o pain to hips, knees, and ankles.  No recent fall.   The history is provided by the patient and medical records. The history is limited by the condition of the patient. No language interpreter was used.    Past Medical History:  Diagnosis Date  . AAA (abdominal aortic aneurysm) without rupture ([place]) 01/24/2016   4.3x4.0 cm per CT   . Atrial fibrillation ([place])   . Cerebrovascular disease   . COPD (chronic obstructive pulmonary disease) ([place])   . CVA 10/25/2008   Qualifier: Diagnosis of  By: [name], CNA, Christy    . DVT (deep venous thrombosis) ([place])   . GERD (gastroesophageal reflux disease)   . Hypertension   . Internal carotid artery stenosis 05/13/2012   HIGH GRADE LEFT INTERNAL CAROTID STENOSIS.  S/p  L CEA 05/11/12  . Irregular heart beat    . PAD (peripheral artery disease) (HCC)    lower extremity PAD with bilateral leg stenting  . TIA (transient ischemic attack)     Patient Active Problem List   Diagnosis Date Noted  . Pneumonia 02/06/2016  . CAP (community acquired pneumonia) 02/06/2016  . Hyponatremia 01/27/2016  . Fall   . Malnutrition of moderate degree 01/25/2016  . Hyperkalemia 01/25/2016  . Hematochezia 01/25/2016  . Urinary retention 01/25/2016  . Fall at home 01/24/2016  . Inability to ambulate due to multiple joints 01/24/2016  . Facet arthropathy of spine 01/24/2016  . Chronic respiratory failure with hypoxia ([place]) 01/24/2016  . COPD with emphysema ([place]) 01/24/2016  . Low back pain at multiple sites 01/24/2016  . Chronic atrial fibrillation ([name]) 01/24/2016  . AAA (abdominal aortic aneurysm) without rupture ([place]) 01/24/2016  . PVD (peripheral vascular disease) ([place]) 01/24/2016  . Constipation 01/24/2016  . Colon adenomas 11/28/2014  . Rectal bleeding 06/30/2014  . Acute respiratory failure with hypoxia ([place]) 04/14/2014  . Pulmonary edema 04/14/2014  . Acute exacerbation of chronic obstructive pulmonary disease (COPD) ([place]) 04/14/2014  . Protein-calorie malnutrition, severe ([place]) 04/14/2014  . Aftercare following surgery of the circulatory system, [place] 06/24/2013  . Hypotension 05/13/2012  . Internal carotid artery stenosis 05/13/2012  . TIA (transient ischemic attack) 05/05/2012  . Hemiballismus 05/03/2012  . Stroke ([place]) 05/02/2012  . COPD (chronic obstructive  pulmonary disease) ([place]) 05/02/2012  . Hypokalemia 07/10/2011  . Peripheral vascular disease ([place]) 05/17/2010  . TOBACCO ABUSE 05/16/2010  . Carotid stenosis 11/06/2009  . FIBRILLATION, ATRIAL 10/25/2008  . CVA 10/25/2008  . LOW BACK PAIN, CHRONIC 10/25/2008    Past Surgical History:  Procedure Laterality Date  . APPENDECTOMY  1956  . CAROTID ANGIOGRAM N/A 05/06/2012   Procedure: CAROTID ANGIOGRAM;  Surgeon: [name], MD;   Location: [facility] CATH LAB;  Service: Cardiovascular;  Laterality: N/A;  . CAROTID ENDARTERECTOMY Left Nov. 4, 2013   CE  . CHOLECYSTECTOMY  1966   Gall Bladder  . COLONOSCOPY N/A 07/12/2014   [id number] cecal polyp/one colon polyp/redundant left colon  . ENDARTERECTOMY  05/11/2012   Procedure: ENDARTERECTOMY CAROTID;  Surgeon: [name], MD;  Location: [facility] OR;  Service: Vascular;  Laterality: Left;  . ESOPHAGOGASTRODUODENOSCOPY N/A 07/12/2014   [id number] in the lower thrid of the esophagus  . EYE SURGERY Left Dec. 22, 2016   Cataract  . GALLBLADDER SURGERY    . MALONEY DILATION N/A 07/12/2014   Procedure: MALONEY DILATION;  Surgeon: [name], MD;  Location: [facility] ENDO SUITE;  Service: Endoscopy;  Laterality: N/A;  . PARTIAL HYSTERECTOMY    . PATCH ANGIOPLASTY  05/11/2012   Procedure: PATCH ANGIOPLASTY;  Surgeon: [name], MD;  Location: [facility] OR;  Service: Vascular;  Laterality: Left;  with Dacron Patch Angioplasty  . SAVORY DILATION N/A 07/12/2014   Procedure: SAVORY DILATION;  Surgeon: [name], MD;  Location: [facility] ENDO SUITE;  Service: Endoscopy;  Laterality: N/A;    OB History    No data available       Home Medications    Prior to Admission medications   Medication Sig Start Date End Date Taking? Authorizing Provider  acetaminophen (TYLENOL) 325 MG tablet Take 650 mg by mouth every 4 (four) hours as needed for mild pain or fever.    Yes Historical Provider, MD  cilostazol (PLETAL) 100 MG tablet Take 0.5 tablets (50 mg total) by mouth 2 (two) times daily. 01/27/16  Yes Rexene Alberts, MD  cyclobenzaprine (FLEXERIL) 5 MG tablet Take 1 tablet (5 mg total) by mouth 3 (three) times daily. 02/09/16  Yes Verlee Monte, MD  dabigatran (PRADAXA) 150 MG CAPS capsule Take 150 mg by mouth 2 (two) times daily.   Yes Historical Provider, MD  hydrALAZINE (APRESOLINE) 10 MG tablet Take 1 tablet (10 mg total) by mouth 2 (two) times daily. New medication to help with treatment of your high  blood pressure. 01/27/16  Yes Rexene Alberts, MD  HYDROcodone-acetaminophen (NORCO/VICODIN) 5-325 MG tablet Take 1 tablet by mouth every 6 (six) hours as needed for moderate pain. DO NOT EXCEED 4GM OF APAP IN 24 HOURS FROM ALL SOURCES 02/12/16  Yes Tiffany L Reed, DO  levalbuterol (XOPENEX) 0.63 MG/3ML nebulizer solution Take 3 mLs (0.63 mg total) by nebulization every 4 (four) hours as needed for wheezing or shortness of breath. 01/27/16  Yes Rexene Alberts, MD  levofloxacin (LEVAQUIN) 750 MG tablet Take 1 tablet (750 mg total) by mouth every other day. 02/10/16  Yes Verlee Monte, MD  lidocaine (LIDODERM) 5 % Place 1 patch onto the skin daily. Remove & Discard patch within 12 hours or as directed by MD   Yes Historical Provider, MD  lidocaine (LMX) 4 % cream Apply 1 application topically daily as needed (for pain (hips/back)).    Yes Historical Provider, MD  Multiple Vitamin (MULTIVITAMIN WITH MINERALS) TABS tablet Take  1 tablet by mouth daily. 01/27/16  Yes Rexene Alberts, MD  pantoprazole (PROTONIX) 40 MG tablet Take 1 tablet (40 mg total) by mouth daily. 01/27/16  Yes Rexene Alberts, MD  predniSONE (STERAPRED UNI-PAK 21 TAB) 10 MG (21) TBPK tablet Take 6-5-4-3-2-1 PO orally till gone 02/09/16  Yes Mutaz Elmahi, MD  senna-docusate (SENOKOT-S) 8.6-50 MG tablet Take 2 tablets by mouth at bedtime. For constipation 01/27/16  Yes Rexene Alberts, MD  tiotropium (SPIRIVA) 18 MCG inhalation capsule Place 18 mcg into inhaler and inhale at bedtime.   Yes Historical Provider, MD  verapamil (VERELAN PM) 240 MG 24 hr capsule Take 240 mg by mouth daily.   Yes Historical Provider, MD  OXYGEN 3 L/mn as needed every shift    Historical Provider, MD    Family History Family History  Problem Relation Age of Onset  . Deep vein thrombosis Sister     Blood Clot in leg  . Alzheimer's disease Brother   . COPD Sister   . COPD Sister     Social History Social History  Substance Use Topics  . Smoking status: Former Smoker     Packs/day: 1.00    Quit date: 02/23/2014  . Smokeless tobacco: Former Systems developer  . Alcohol use No     Allergies   Ibuprofen; Pnu-imune [pneumococcal polysaccharide vaccine]; Advair diskus [fluticasone-salmeterol]; Albuterol; Amoxicillin; Cephalexin; Codeine; Entex pac [pseudoephedrine-gg & dm]; Etanercept; Gualenic acid; Lorazepam; Nubain [nalbuphine hcl]; Oxycodone hcl; Oxycodone-acetaminophen; Roxicodone [oxycodone hcl]; Tramadol; and Tylox [oxycodone-acetaminophen]   Review of Systems Review of Systems  Unable to perform ROS: Acuity of condition  All other systems reviewed and are negative.  Physical Exam Updated Vital Signs BP 107/72   Pulse 93   Temp 97.3 F (36.3 C) (Rectal)   Resp 15   Ht 5\' 1"  (1.549 m)   Wt 89 lb (40.4 kg)   SpO2 100%   BMI 16.82 kg/m   Physical Exam  Constitutional: She is oriented to person, place, and time.  Somnolent appearing   HENT:  Head: Normocephalic and atraumatic.  Dry mucous membranes  Eyes: EOM are normal.  Neck: Normal range of motion.  Cardiovascular: Tachycardia present.   Atrial fibrillation  Pulmonary/Chest: She is in respiratory distress.  Diffuse rhonchi bilaterally   Abdominal: Soft.  Musculoskeletal: Normal range of motion.  Neurological: She is alert and oriented to person, place, and time.  Skin: Skin is warm and dry.  Nursing note and vitals reviewed.  ED Treatments / Results  Labs (all labs ordered are listed, but only abnormal results are displayed) Labs Reviewed  COMPREHENSIVE METABOLIC PANEL - Abnormal; Notable for the following:       Result Value   Sodium 131 (*)    Potassium 3.2 (*)    Chloride 96 (*)    Glucose, Bld 203 (*)    BUN 24 (*)    Calcium 8.2 (*)    Alkaline Phosphatase 165 (*)    Total Bilirubin 1.5 (*)    All other components within normal limits  CBC WITH DIFFERENTIAL/PLATELET - Abnormal; Notable for the following:    WBC 11.4 (*)    RDW 17.7 (*)    Neutro Abs 10.5 (*)    Lymphs Abs 0.3  (*)    All other components within normal limits  BLOOD GAS, ARTERIAL - Abnormal; Notable for the following:    pH, Arterial 7.518 (*)    pCO2 arterial 32.0 (*)    pO2, Arterial 77.2 (*)    Bicarbonate  27.6 (*)    Acid-Base Excess 3.0 (*)    All other components within normal limits  URINALYSIS, ROUTINE W REFLEX MICROSCOPIC (NOT AT [facility]) - Abnormal; Notable for the following:    Hgb urine dipstick MODERATE (*)    All other components within normal limits  URINE MICROSCOPIC-ADD ON - Abnormal; Notable for the following:    Squamous Epithelial / LPF 0-5 (*)    Bacteria, UA FEW (*)    All other components within normal limits  TROPONIN I    EKG  EKG Interpretation  Date/Time:  Wednesday February 14 2016 13:34:46 EDT Ventricular Rate:  111 PR Interval:    QRS Duration: 72 QT Interval:  327 QTC Calculation: 445 R Axis:   73 Text Interpretation:  Atrial fibrillation Anteroseptal infarct, old Minimal ST depression, anterolateral leads Confirmed by [facility] MD, Chandi Nicklin ([id number]) on 02/14/2016 1:49:47 PM       Radiology Ct Chest W Contrast  Result Date: 02/14/2016 CLINICAL DATA:  Abnormal CXR. Pt has been sob but is unable to verbalize time frame. Pt has hx of COPD and irregular heart beat. EXAM: CT CHEST WITH CONTRAST TECHNIQUE: Multidetector CT imaging of the chest was performed during intravenous contrast administration. CONTRAST:  75 cc Isovue 300 COMPARISON:  02/23/2010 FINDINGS: Cardiovascular: The heart is enlarged. Significant coronary artery calcifications are present. Trace pericardial effusion. There is aberrant arch anatomy. Aberrant right subclavian this significant atherosclerotic calcification at its origin. There is a common trunk for both common carotid arteries and a separate left subclavian artery. Extensive atherosclerotic calcification of the thoracic aorta not associated with aneurysm or dissection. Study was not performed for evaluation of the pulmonary arteries.  Mediastinum/Nodes: The visualized portion of the thyroid gland has a normal appearance. Small, nonspecific mediastinal lymph nodes. Pericardial fluid is identified in the superior pericardial recesses. The esophagus is normal in appearance. Lungs/Pleura: Fluid attenuation is identified within right lower lobe bronchi. No enhancing mass is identified in this region. Other bronchi appear patent. There is bibasilar atelectasis. Small bilateral pleural effusions are also present. Upper Abdomen: Bilateral renal cysts. Musculoskeletal: Mild degenerative changes are seen in the spine. IMPRESSION: 1. Mucus plugs or aspiration within right lower lobe bronchi. 2. Bilateral pleural effusions and bibasilar atelectasis. 3. Significant coronary artery disease. 4.  Aortic atherosclerosis. 5. Apparent arch anatomy. 6. Bilateral renal cysts. Electronically Signed   By: Nolon Nations M.D.   On: 02/14/2016 18:37   Dg Chest Port 1 View  Result Date: 02/14/2016 CLINICAL DATA:  Irregular heartbeat, shortness of breath, history COPD, atrial fibrillation, hypertension EXAM: PORTABLE CHEST 1 VIEW COMPARISON:  Portable exam 1455 hours compared 02/06/2016 FINDINGS: Normal heart size and mediastinal contours. Atherosclerotic calcification aorta. Slight pulmonary vascular congestion. Increased atelectasis versus consolidation in RIGHT lower lobe. Remaining lungs demonstrate chronic changes and question underlying COPD. No acute infiltrate, pleural effusion or pneumothorax. Bones demineralized. IMPRESSION: Probable COPD changes with increased atelectasis versus consolidation in RIGHT lower lobe. Electronically Signed   By: Lavonia Dana M.D.   On: 02/14/2016 15:31    Procedures Procedures  COORDINATION OF CARE: 1:39 PM Discussed next steps with pt. Will move forward with next steps based on pt evaluation.   Medications Ordered in ED Medications  methylPREDNISolone sodium succinate (SOLU-MEDROL) 125 mg/2 mL injection 125 mg (125 mg  Intravenous Given 02/14/16 1411)  levalbuterol (XOPENEX) nebulizer solution 1.25 mg (1.25 mg Nebulization Given 02/14/16 1452)  sodium chloride 0.9 % bolus 1,000 mL (0 mLs Intravenous Stopped 02/14/16 1707)  diltiazem (  CARDIZEM) injection 20 mg (20 mg Intravenous Given 02/14/16 1522)  iopamidol (ISOVUE-300) 61 % injection 75 mL (75 mLs Intravenous Contrast Given 02/14/16 1842)     Initial Impression / Assessment and Plan / ED Course  I have reviewed the triage vital signs and the nursing notes.  Pertinent labs & imaging results that were available during my care of the patient were reviewed by me and considered in my medical decision making (see chart for details).  Clinical Course  I personally performed the services described in this documentation, which was scribed in my presence. The recorded information has been reviewed and is accurate.  Pt is much more alert.  She said her breathing is back to baseline.  Hr has improved with cardizem.  Pt does have mucous plugging on ct, so I will give pt a flutter valve to see if that helps.  Pt happy to go back to the facility.  Pt's daughters said that pt has been on pain meds and xanax since she fell and she was hallucinating.  They took her off of it today which is probably why she was hurting.  Pt knows to return if worse.  Take meds as directed.  Pt's family also said that pt was on spiriva at home, but has not been on that since she went into the hospital (although it's on her med list).  They said that it helps pt, so they requested that she go back on it.  I will write another rx, so hopefully she will get it.  Final Clinical Impressions(s) / ED Diagnoses   Final diagnoses:  Cough  Chronic atrial fibrillation (HCC)  Chronic obstructive pulmonary disease with acute exacerbation [facility])    New Prescriptions New Prescriptions   No medications on file     Isla Pence, MD 02/14/16 1859

## 2016-02-15 ENCOUNTER — Encounter: Payer: Self-pay | Admitting: Internal Medicine

## 2016-02-15 NOTE — Progress Notes (Signed)
Location:   [facility] Room Number: 132/P Place of Service:  SNF (31) Provider:  Amado Coe., MD  Patient Care Team: Redmond School, MD as PCP - General (Internal Medicine) [name], MD as Consulting Physician (Gastroenterology) Minus Breeding, MD as Consulting Physician (Cardiology)  Extended Emergency Contact Information Primary Emergency Contact: Arnette Schaumann [place] Phone: [phone] Relation: Daughter Secondary Emergency Contact: Knight,Janet  [place] Phone: [phone] Relation: Daughter  Code Status:  Living Will Goals of care: Advanced Directive information Advanced Directives 02/15/2016  Does patient have an advance directive? Yes  Type of Advance Directive Living will  Does patient want to make changes to advanced directive? No - Patient declined  Copy of advanced directive(s) in chart? Yes  Would patient like information on creating an advanced directive? -  Pre-existing out of facility DNR order (yellow form or pink MOST form) -     Chief Complaint  Patient presents with  . Acute Visit    Follow-up from ER    HPI:  Pt is a 81 y.o. female seen today for an acute visit for    Past Medical History:  Diagnosis Date  . AAA (abdominal aortic aneurysm) without rupture ([place]) 01/24/2016   4.3x4.0 cm per CT   . Atrial fibrillation ([facility])   . Cerebrovascular disease   . COPD (chronic obstructive pulmonary disease) ([place])   . CVA 10/25/2008   Qualifier: Diagnosis of  By: [name], CNA, Christy    . DVT (deep venous thrombosis) ([facility] [facility])   . GERD (gastroesophageal reflux disease)   . Hypertension   . Internal carotid artery stenosis 05/13/2012   HIGH GRADE LEFT INTERNAL CAROTID STENOSIS.  S/p  L CEA 05/11/12  . Irregular heart beat   . PAD (peripheral artery disease) ([facility])    lower extremity PAD with bilateral leg stenting  . TIA (transient ischemic attack)    Past  Surgical History:  Procedure Laterality Date  . APPENDECTOMY  1956  . CAROTID ANGIOGRAM N/A 05/06/2012   Procedure: CAROTID ANGIOGRAM;  Surgeon: [name], MD;  Location: [facility] CATH LAB;  Service: Cardiovascular;  Laterality: N/A;  . CAROTID ENDARTERECTOMY Left Nov. 4, 2013   CE  . CHOLECYSTECTOMY  1966   Gall Bladder  . COLONOSCOPY N/A 07/12/2014   [id number] cecal polyp/one colon polyp/redundant left colon  . ENDARTERECTOMY  05/11/2012   Procedure: ENDARTERECTOMY CAROTID;  Surgeon: [name], MD;  Location: [facility] OR;  Service: Vascular;  Laterality: Left;  . ESOPHAGOGASTRODUODENOSCOPY N/A 07/12/2014   [id number] in the lower thrid of the esophagus  . EYE SURGERY Left Dec. 22, 2016   Cataract  . GALLBLADDER SURGERY    . MALONEY DILATION N/A 07/12/2014   Procedure: MALONEY DILATION;  Surgeon: [name], MD;  Location: [facility] ENDO SUITE;  Service: Endoscopy;  Laterality: N/A;  . PARTIAL HYSTERECTOMY    . PATCH ANGIOPLASTY  05/11/2012   Procedure: PATCH ANGIOPLASTY;  Surgeon: [name], MD;  Location: [facility] OR;  Service: Vascular;  Laterality: Left;  with Dacron Patch Angioplasty  . SAVORY DILATION N/A 07/12/2014   Procedure: SAVORY DILATION;  Surgeon: [name], MD;  Location: [facility] ENDO SUITE;  Service: Endoscopy;  Laterality: N/A;    Allergies  Allergen Reactions  . Ibuprofen     Gi upset  . Pnu-Imune [Pneumococcal Polysaccharide Vaccine]     Huge knot at site and rash  . Advair Diskus [Fluticasone-Salmeterol]  Confusion and shortness of breath  . Albuterol     Confusion and shortness of breath  . Amoxicillin Hives  . Cephalexin Hives  . Codeine     confusion  . Entex Pac [Pseudoephedrine-Gg & Dm] Other (See Comments)    Upper respiratory.   . Etanercept     vertigo  . Gualenic Acid Other (See Comments)    unknown  . Lorazepam     hallucinations  . Nubain [Nalbuphine Hcl]   . Oxycodone Hcl Nausea And Vomiting  . Oxycodone-Acetaminophen Nausea And Vomiting   . Roxicodone [Oxycodone Hcl]   . Tramadol     hallucinations   . Tylox [Oxycodone-Acetaminophen]       Medication List       Accurate as of 02/15/16  2:36 PM. Always use your most recent med list.          acetaminophen 325 MG tablet Commonly known as:  TYLENOL Take 650 mg by mouth every 4 (four) hours as needed for mild pain or fever.   cilostazol 50 MG tablet Commonly known as:  PLETAL Take 50 mg by mouth 2 (two) times daily.   cyclobenzaprine 5 MG tablet Commonly known as:  FLEXERIL Take 1 tablet (5 mg total) by mouth 3 (three) times daily.   dabigatran 150 MG Caps capsule Commonly known as:  PRADAXA Take 150 mg by mouth 2 (two) times daily.   hydrALAZINE 10 MG tablet Commonly known as:  APRESOLINE Take 1 tablet (10 mg total) by mouth 2 (two) times daily. New medication to help with treatment of your high blood pressure.   HYDROcodone-acetaminophen 5-325 MG tablet Commonly known as:  NORCO/VICODIN Take 1 tablet by mouth every 6 (six) hours as needed for moderate pain. DO NOT EXCEED 4GM OF APAP IN 24 HOURS FROM ALL SOURCES   levalbuterol 0.63 MG/3ML nebulizer solution Commonly known as:  XOPENEX Take 3 mLs (0.63 mg total) by nebulization every 4 (four) hours as needed for wheezing or shortness of breath.   levofloxacin 750 MG tablet Commonly known as:  LEVAQUIN Take 1 tablet (750 mg total) by mouth every other day.   lidocaine 4 % cream Commonly known as:  LMX Apply 1 application topically daily as needed (for pain (hips/back)).   LIDODERM 5 % Generic drug:  lidocaine Place 1 patch onto the skin daily. Remove & Discard patch within 12 hours or as directed by MD   multivitamin with minerals Tabs tablet Take 1 tablet by mouth daily.   OXYGEN 3 L/mn as needed every shift   pantoprazole 40 MG tablet Commonly known as:  PROTONIX Take 1 tablet (40 mg total) by mouth daily.   predniSONE 10 MG (21) Tbpk tablet Commonly known as:  STERAPRED UNI-PAK 21  TAB Take 6-5-4-3-2-1 PO orally till gone   senna-docusate 8.6-50 MG tablet Commonly known as:  Senokot-S Take 2 tablets by mouth at bedtime. For constipation   SPIRIVA HANDIHALER 18 MCG inhalation capsule Generic drug:  tiotropium Place 10 mcg into inhaler and inhale at bedtime   verapamil 240 MG 24 hr capsule Commonly known as:  VERELAN PM Take 240 mg by mouth daily.       Review of Systems  Immunization History  Administered Date(s) Administered  . Influenza Split 05/03/2012  . Influenza,inj,Quad PF,36+ Mos 04/18/2014  . Influenza-Unspecified 05/08/2013   Pertinent  Health Maintenance Due  Topic Date Due  . DEXA SCAN  11/14/2001  . PNA vac Low Risk Adult (1 of 2 -  PCV13) 11/14/2001  . INFLUENZA VACCINE  [id number] (Originally 02/06/2016)   No flowsheet data found. Functional Status Survey:    There were no vitals filed for this visit. There is no height or weight on file to calculate BMI. Physical Exam  Labs reviewed:  Recent Labs  01/25/16 1711  02/06/16 2020 02/09/16 0550 02/14/16 1405  NA 131*  < > 142 135 131*  K 4.8  < > 3.4* 5.2* 3.2*  CL 98*  < > 102 99* 96*  CO2 26  < > 32 31 28  GLUCOSE 240*  < > 121* 152* 203*  BUN 26*  < > 21* 40* 24*  CREATININE 0.63  < > 0.51 0.47 0.57  CALCIUM 8.6*  < > 8.7* 8.3* 8.2*  MG 1.8  --   --   --   --   < > = values in this interval not displayed.  Recent Labs  02/06/16 2020 02/14/16 1405  AST 19 23  ALT 19 42  ALKPHOS 124 165*  BILITOT 0.6 1.5*  PROT 5.8* 6.6  ALBUMIN 3.2* 3.8    Recent Labs  01/24/16 0640  01/27/16 0500 02/06/16 2020 02/14/16 1405  WBC 7.7  < > 10.8* 10.1 11.4*  NEUTROABS 5.4  --   --  8.0* 10.5*  HGB 11.7*  < > 10.7* 10.9* 13.4  HCT 35.6*  < > 32.0* 33.5* 39.9  MCV 81.5  < > 80.8 84.0 81.6  PLT 203  < > 225 285 302  < > = values in this interval not displayed. Lab Results  Component Value Date   TSH 2.775 01/25/2016   Lab Results  Component Value Date   HGBA1C 6.1 (H)  05/03/2012   Lab Results  Component Value Date   CHOL 186 05/03/2012   HDL 71 05/03/2012   LDLCALC 101 (H) 05/03/2012   TRIG 72 05/03/2012   CHOLHDL 2.6 05/03/2012    Significant Diagnostic Results in last 30 days:  Dg Lumbar Spine Complete  Result Date: 01/24/2016 CLINICAL DATA:  Status post trip and fall this morning with onset of low back pain. Initial encounter. EXAM: LUMBAR SPINE - COMPLETE 4+ VIEW COMPARISON:  CT abdomen and pelvis 06/30/2014. FINDINGS: There is no fracture. Trace anterolisthesis L5 on S1 due to facet arthropathy is unchanged. Intervertebral disc space height is maintained. The patient has an abdominal aortic aneurysm measuring 4.4 cm AP, increased from approximately 3.1 cm on the prior examination. Large colonic stool burden is noted. IMPRESSION: No acute abnormality. 4.4 cm abdominal aortic aneurysm is increased from 3.1 cm on the prior exam. Recommend follow-up ultrasound in 1 year. This recommendation follows ACR consensus guidelines. Large colonic stool burden. Electronically Signed   By: Inge Rise M.D.   On: 01/24/2016 07:12   Dg Pelvis 1-2 Views  Result Date: 01/24/2016 CLINICAL DATA:  Status post trip and fall this morning with onset of pelvic pain. Initial encounter. EXAM: PELVIS - 1-2 VIEW COMPARISON:  CT abdomen and pelvis 06/30/2014. FINDINGS: There is no evidence of pelvic fracture or diastasis. No pelvic bone lesions are seen. Bones are osteopenic. Large colonic stool burden is noted. IMPRESSION: No acute abnormality. Osteopenia. Large colonic stool burden. Electronically Signed   By: Inge Rise M.D.   On: 01/24/2016 07:13   Ct Chest W Contrast  Result Date: 02/14/2016 CLINICAL DATA:  Abnormal CXR. Pt has been sob but is unable to verbalize time frame. Pt has hx of COPD and irregular heart beat. EXAM: CT  CHEST WITH CONTRAST TECHNIQUE: Multidetector CT imaging of the chest was performed during intravenous contrast administration. CONTRAST:  75 cc  Isovue 300 COMPARISON:  02/23/2010 FINDINGS: Cardiovascular: The heart is enlarged. Significant coronary artery calcifications are present. Trace pericardial effusion. There is aberrant arch anatomy. Aberrant right subclavian this significant atherosclerotic calcification at its origin. There is a common trunk for both common carotid arteries and a separate left subclavian artery. Extensive atherosclerotic calcification of the thoracic aorta not associated with aneurysm or dissection. Study was not performed for evaluation of the pulmonary arteries. Mediastinum/Nodes: The visualized portion of the thyroid gland has a normal appearance. Small, nonspecific mediastinal lymph nodes. Pericardial fluid is identified in the superior pericardial recesses. The esophagus is normal in appearance. Lungs/Pleura: Fluid attenuation is identified within right lower lobe bronchi. No enhancing mass is identified in this region. Other bronchi appear patent. There is bibasilar atelectasis. Small bilateral pleural effusions are also present. Upper Abdomen: Bilateral renal cysts. Musculoskeletal: Mild degenerative changes are seen in the spine. IMPRESSION: 1. Mucus plugs or aspiration within right lower lobe bronchi. 2. Bilateral pleural effusions and bibasilar atelectasis. 3. Significant coronary artery disease. 4.  Aortic atherosclerosis. 5. Apparent arch anatomy. 6. Bilateral renal cysts. Electronically Signed   By: Nolon Nations M.D.   On: 02/14/2016 18:37   Ct Lumbar Spine Wo Contrast  Result Date: 01/24/2016 CLINICAL DATA:  Lumbago.  Fall earlier today EXAM: CT LUMBAR SPINE WITHOUT CONTRAST TECHNIQUE: Multidetector CT imaging of the lumbar spine was performed without intravenous contrast administration. Multiplanar CT image reconstructions were also generated. COMPARISON:  CT abdomen and pelvis with bony reformats June 30, 2014; lumbar radiographs January 24, 2016 FINDINGS: Bones are osteoporotic. There is no evidence of  acute fracture or spondylolisthesis. There is slight disc space narrowing at T12-L1. Other disc spaces appear unremarkable. At T12-L1, there is mild generalized disc bulging. There is mild facet hypertrophy bilaterally. There is no nerve root edema or effacement. No disc extrusion or stenosis. At L1-2, there is moderate facet hypertrophy bilaterally. There is mild diffuse disc bulging. There is no nerve root edema or effacement. No disc extrusion or stenosis. At L2-3, there is moderate facet hypertrophy bilaterally and mild diffuse disc bulging. No nerve root edema or effacement. No disc extrusion or stenosis. At L3-4, there is moderate facet hypertrophy bilaterally and mild diffuse disc bulging. No nerve root edema or effacement. No disc extrusion or stenosis. At L4-5, there is a broad-based disc bulging and moderate facet hypertrophy bilaterally. There is mild ligamentum flava hypertrophy bilaterally causing borderline lateral canal stenosis and L4-5 bilaterally. No central canal stenosis noted. No disc extrusion. No nerve root edema or effacement evident. At L5-S1, there is moderate facet hypertrophy bilaterally. There is broad-based disc protrusion without appreciable nerve root edema or effacement. Node disc extrusion or stenosis evident. There is stable calcification between the L2 and L3 spinous processes, possibly residua of old trauma. There is slight lumbar levoscoliosis. There is degenerative change in the sacroiliac joints bilaterally, slightly more severe on the right than on the left. There is extensive calcification in the distal aorta and common iliac arteries. There is aneurysmal dilatation of the distal abdominal aorta with a maximum transverse diameter of 4.3 x 4.0 cm. This aneurysm extends to just above the bifurcation. It arises inferior to the renal arteries. There is no periaortic fluid. IMPRESSION: No fracture or spondylolisthesis. Multilevel arthropathy. Borderline lateral canal stenosis at  L4-5, multifactorial. No central stenosis. No disc extrusion or nerve root edema/effacement.  Abdominal aortic aneurysm with maximum transverse diameter of 4.3 x 4.0 cm. Recommend followup by ultrasound in 1 year. This recommendation follows ACR consensus guidelines: White Paper of the ACR Incidental Findings Committee II on Vascular Findings. J Am Coll Radiol 2013; 10:789-794. As this aneurysm has increased in size compared to prior study from December 2015, vascular surgery consultation may also be advisable at this time. Bones osteoporotic. Electronically Signed   By: Lowella Grip III M.D.   On: 01/24/2016 10:14   Dg Chest Port 1 View  Result Date: 02/14/2016 CLINICAL DATA:  Irregular heartbeat, shortness of breath, history COPD, atrial fibrillation, hypertension EXAM: PORTABLE CHEST 1 VIEW COMPARISON:  Portable exam 1455 hours compared 02/06/2016 FINDINGS: Normal heart size and mediastinal contours. Atherosclerotic calcification aorta. Slight pulmonary vascular congestion. Increased atelectasis versus consolidation in RIGHT lower lobe. Remaining lungs demonstrate chronic changes and question underlying COPD. No acute infiltrate, pleural effusion or pneumothorax. Bones demineralized. IMPRESSION: Probable COPD changes with increased atelectasis versus consolidation in RIGHT lower lobe. Electronically Signed   By: Lavonia Dana M.D.   On: 02/14/2016 15:31   Dg Chest Portable 1 View  Result Date: 02/06/2016 CLINICAL DATA:  81 year old female with increasing cough and pain. Initial encounter. EXAM: PORTABLE CHEST 1 VIEW COMPARISON:  11/08/2014 and earlier. FINDINGS: Portable AP semi upright view at 1951 hours. Chronic pulmonary hyperinflation. Patchy increased bibasilar opacity slightly greater on the right. No pneumothorax or pulmonary edema. No definite pleural effusion. Stable cardiac size and mediastinal contours. Ir calcified aortic atherosclerosis. Visualized tracheal air column is within normal limits.  IMPRESSION: 1. chronic hyperinflation with patchy acute bibasilar opacity. Favor acute infectious exacerbation. No definite pleural effusion. 2. Calcified aortic atherosclerosis. Electronically Signed   By: Genevie Ann M.D.   On: 02/06/2016 20:12   Dg Hips Bilat With Pelvis Min 5 Views  Result Date: 02/06/2016 CLINICAL DATA:  Fall at home 01/24/2016 (2 weeks prior), persistent bilateral hip pain since fall. EXAM: DG HIP (WITH OR WITHOUT PELVIS) 5+V BILAT COMPARISON:  Pelvis radiographs 01/24/2016 FINDINGS: The cortical margins of the bony pelvis and both hips are intact. No acute or healing fracture. Pubic symphysis and sacroiliac joints are congruent. Both femoral heads are well-seated in the respective acetabula. Bony under mineralization again seen. Large stool burden. Atherosclerosis and vascular stents. IMPRESSION: 1. No pelvic fracture. 2. Osteopenia. Electronically Signed   By: Jeb Levering M.D.   On: 02/06/2016 22:54    Assessment/Plan There are no diagnoses linked to this encounter.      Oralia Manis, [place] [phone]   This encounter was created in error - please disregard.

## 2016-02-16 ENCOUNTER — Encounter (HOSPITAL_COMMUNITY)
Admission: RE | Admit: 2016-02-16 | Discharge: 2016-02-16 | Disposition: A | Discharge status: Home / Self Care | Payer: Medicare Other | Source: Skilled Nursing Facility | Attending: Internal Medicine | Admitting: Internal Medicine

## 2016-02-16 ENCOUNTER — Encounter: Payer: Self-pay | Admitting: Internal Medicine

## 2016-02-16 ENCOUNTER — Non-Acute Institutional Stay (SKILLED_NURSING_FACILITY): Payer: Medicare Other | Admitting: Internal Medicine

## 2016-02-16 DIAGNOSIS — J9611 Chronic respiratory failure with hypoxia: Secondary | ICD-10-CM | POA: Insufficient documentation

## 2016-02-16 DIAGNOSIS — I739 Peripheral vascular disease, unspecified: Secondary | ICD-10-CM

## 2016-02-16 DIAGNOSIS — M545 Low back pain, unspecified: Secondary | ICD-10-CM

## 2016-02-16 DIAGNOSIS — I482 Chronic atrial fibrillation, unspecified: Secondary | ICD-10-CM

## 2016-02-16 DIAGNOSIS — M62838 Other muscle spasm: Secondary | ICD-10-CM | POA: Insufficient documentation

## 2016-02-16 DIAGNOSIS — E876 Hypokalemia: Secondary | ICD-10-CM | POA: Insufficient documentation

## 2016-02-16 DIAGNOSIS — K219 Gastro-esophageal reflux disease without esophagitis: Secondary | ICD-10-CM | POA: Insufficient documentation

## 2016-02-16 LAB — BASIC METABOLIC PANEL
Anion gap: 4 — ABNORMAL LOW (ref 5–15)
BUN: 26 mg/dL — AB (ref 6–20)
CHLORIDE: 101 mmol/L (ref 101–111)
CO2: 32 mmol/L (ref 22–32)
CREATININE: 0.55 mg/dL (ref 0.44–1.00)
Calcium: 8.1 mg/dL — ABNORMAL LOW (ref 8.9–10.3)
GFR calc Af Amer: >60 mL/min (ref >60)
Glucose, Bld: 75 mg/dL (ref 65–99)
POTASSIUM: 4.3 mmol/L (ref 3.5–5.1)
SODIUM: 137 mmol/L (ref 135–145)

## 2016-02-16 LAB — CBC WITH DIFFERENTIAL/PLATELET
BASOS ABS: 0 10*3/uL (ref 0.0–0.1)
BASOS PCT: 0 %
EOS ABS: 0.1 10*3/uL (ref 0.0–0.7)
EOS PCT: 1 %
HCT: 34.8 % — ABNORMAL LOW (ref 36.0–46.0)
Hemoglobin: 11.7 g/dL — ABNORMAL LOW (ref 12.0–15.0)
Lymphocytes Relative: 11 %
Lymphs Abs: 1.2 10*3/uL (ref 0.7–4.0)
MCH: 27.8 pg (ref 26.0–34.0)
MCHC: 33.6 g/dL (ref 30.0–36.0)
MCV: 82.7 fL (ref 78.0–100.0)
Monocytes Absolute: 1.1 10*3/uL — ABNORMAL HIGH (ref 0.1–1.0)
Monocytes Relative: 10 %
Neutro Abs: 8.5 10*3/uL — ABNORMAL HIGH (ref 1.7–7.7)
Neutrophils Relative %: 78 %
PLATELETS: 252 10*3/uL (ref 150–400)
RBC: 4.21 MIL/uL (ref 3.87–5.11)
RDW: 18 % — ABNORMAL HIGH (ref 11.5–15.5)
WBC: 10.9 10*3/uL — AB (ref 4.0–10.5)

## 2016-02-16 NOTE — Progress Notes (Signed)
Location:   [facility] Room Number: 132/P Place of Service:  SNF (31) Provider:  Amado Coe., MD  Patient Care Team: Redmond School, MD as PCP - General (Internal Medicine) [name], MD as Consulting Physician (Gastroenterology) Minus Breeding, MD as Consulting Physician (Cardiology)  Extended Emergency Contact Information Primary Emergency Contact: Arnette Schaumann [place] of [place] Phone: [phone] Relation: Daughter Secondary Emergency Contact: Knight,Janet  [place] Phone: [phone] Relation: Daughter  Code Status:  Full Code Goals of care: Advanced Directive information Advanced Directives 02/16/2016  Does patient have an advance directive? Yes  Type of Advance Directive (No Data)  Does patient want to make changes to advanced directive? No - Patient declined  Copy of advanced directive(s) in chart? Yes  Would patient like information on creating an advanced directive? -  Pre-existing out of facility DNR order (yellow form or pink MOST form) -     Chief Complaint  Patient presents with  . Acute Visit    Pain in ear canal and in mouth   Also acute visit follow-up ER visit for hypoxia-atrial fibrillation  HPI:  Pt is a 81 y.o. female seen today for an acute visit for the above issues.  2 days ago patient did go to the ER with hypoxia and elevated heart rate as well as increased lethargy.  She was recently discharged from the hospital with numerous diagnoses including chronic pain-pneumonia-and atrial fibrillation.  She had been completing a course of Levaquin as well as a prednisone taper.  In the ER she did receive Solu-Medrol Medrol as well as Xopenex-she also was given Cardizem injection for A. fib-all the stabilizing patient apparently was doing much better and was returned to the facility.  A CT scan of the chest did show possible mucus plugging versus atelectasis right  lower lobe.  Bilateral pleural effusions by basilar atelectasis-clinically she appears to be doing much better in this regards she is not complaining of shortness of breath at this time O2 saturations are in the 90s heart rate is controlled.  She does continue on verapamil.  She is also complaining of right ear discomfort-her daughters in the room and states she does have a significant history of ear infections here and usually responds quite well to amoxicillin.  She currently is afebrile again of yours to be feeling much better she is not really complaining of pain at this point although this will have to be watched as this has been an issue as well.        Past Medical History:  Diagnosis Date  . AAA (abdominal aortic aneurysm) without rupture ([facility]) 01/24/2016   4.3x4.0 cm per CT   . Atrial fibrillation ([facility])   . Cerebrovascular disease   . COPD (chronic obstructive pulmonary disease) ([facility])   . CVA 10/25/2008   Qualifier: Diagnosis of  By: [name], CNA, Christy    . DVT (deep venous thrombosis) ([facility])   . GERD (gastroesophageal reflux disease)   . Hypertension   . Internal carotid artery stenosis 05/13/2012   HIGH GRADE LEFT INTERNAL CAROTID STENOSIS.  S/p  L CEA 05/11/12  . Irregular heart beat   . PAD (peripheral artery disease) ([facility])    lower extremity PAD with bilateral leg stenting  . TIA (transient ischemic attack)    Past Surgical History:  Procedure Laterality Date  . APPENDECTOMY  1956  . CAROTID ANGIOGRAM N/A 05/06/2012   Procedure: CAROTID ANGIOGRAM;  Surgeon: Juanda Crumble  Antony Blackbird, MD;  Location: [facility];  Service: Cardiovascular;  Laterality: N/A;  . CAROTID ENDARTERECTOMY Left Nov. 4, 2013   CE  . CHOLECYSTECTOMY  1966   Gall Bladder  . COLONOSCOPY N/A 07/12/2014   [id number] cecal polyp/one colon polyp/redundant left colon  . ENDARTERECTOMY  05/11/2012   Procedure: ENDARTERECTOMY CAROTID;  Surgeon: [name], MD;  Location: [facility] OR;  Service: Vascular;   Laterality: Left;  . ESOPHAGOGASTRODUODENOSCOPY N/A 07/12/2014   [id number] in the lower thrid of the esophagus  . EYE SURGERY Left Dec. 22, 2016   Cataract  . GALLBLADDER SURGERY    . MALONEY DILATION N/A 07/12/2014   Procedure: MALONEY DILATION;  Surgeon: [name], MD;  [facility];  Service: Endoscopy;  Laterality: N/A;  . PARTIAL HYSTERECTOMY    . PATCH ANGIOPLASTY  05/11/2012   Procedure: PATCH ANGIOPLASTY;  Surgeon: [name], MD;  Location: [facility] OR;  Service: Vascular;  Laterality: Left;  with Dacron Patch Angioplasty  . SAVORY DILATION N/A 07/12/2014   Procedure: SAVORY DILATION;  Surgeon: [name], MD;  Location[facility];  Service: Endoscopy;  Laterality: N/A;    Allergies  Allergen Reactions  . Ibuprofen     Gi upset  . Pnu-Imune [Pneumococcal Polysaccharide Vaccine]     Huge knot at site and rash  . Advair Diskus [Fluticasone-Salmeterol]     Confusion and shortness of breath  . Albuterol     Confusion and shortness of breath  . Amoxicillin Hives  . Cephalexin Hives  . Codeine     confusion  . Entex Pac [Pseudoephedrine-Gg & Dm] Other (See Comments)    Upper respiratory.   . Etanercept     vertigo  . Gualenic Acid Other (See Comments)    unknown  . Lorazepam     hallucinations  . Nubain [Nalbuphine Hcl]   . Oxycodone Hcl Nausea And Vomiting  . Oxycodone-Acetaminophen Nausea And Vomiting  . Roxicodone [Oxycodone Hcl]   . Tramadol     hallucinations   . Tylox [Oxycodone-Acetaminophen]       Medication List       Accurate as of 02/16/16 11:50 AM. Always use your most recent med list.          acetaminophen 325 MG tablet Commonly known as:  TYLENOL Take 650 mg by mouth every 4 (four) hours as needed for mild pain or fever.   cilostazol 50 MG tablet Commonly known as:  PLETAL Take 50 mg by mouth 2 (two) times daily.   cyclobenzaprine 5 MG tablet Commonly known as:  FLEXERIL Take 1 tablet (5 mg total) by mouth 3  (three) times daily.   dabigatran 150 MG Caps capsule Commonly known as:  PRADAXA Take 150 mg by mouth 2 (two) times daily.   hydrALAZINE 10 MG tablet Commonly known as:  APRESOLINE Take 1 tablet (10 mg total) by mouth 2 (two) times daily. New medication to help with treatment of your high blood pressure.   HYDROcodone-acetaminophen 5-325 MG tablet Commonly known as:  NORCO/VICODIN Take 1 tablet by mouth every 6 (six) hours as needed for moderate pain. DO NOT EXCEED 4GM OF APAP IN 24 HOURS FROM ALL SOURCES   levalbuterol 0.63 MG/3ML nebulizer solution Commonly known as:  XOPENEX Take 3 mLs (0.63 mg total) by nebulization every 4 (four) hours as needed for wheezing or shortness of breath.   levofloxacin 750 MG tablet Commonly known as:  LEVAQUIN Take 1 tablet (750  mg total) by mouth every other day.   lidocaine 4 % cream Commonly known as:  LMX Apply 1 application topically daily as needed (for pain (hips/back)).   LIDODERM 5 % Generic drug:  lidocaine Place 1 patch onto the skin daily. Remove & Discard patch within 12 hours or as directed by MD   multivitamin with minerals Tabs tablet Take 1 tablet by mouth daily.   NON FORMULARY Kozy shack pudding twice a day in between meals   NON FORMULARY Magic cup should come on meal tray. Give once a day   OXYGEN 3 L/mn as needed every shift   pantoprazole 40 MG tablet Commonly known as:  PROTONIX Take 1 tablet (40 mg total) by mouth daily.   predniSONE 10 MG tablet Commonly known as:  DELTASONE Take 10 mg by mouth daily with breakfast. End Date 02/16/16   senna-docusate 8.6-50 MG tablet Commonly known as:  Senokot-S Take 2 tablets by mouth at bedtime. For constipation   SPIRIVA HANDIHALER 18 MCG inhalation capsule Generic drug:  tiotropium Place 10 mcg into inhaler and inhale at bedtime   verapamil 240 MG 24 hr capsule Commonly known as:  VERELAN PM Take 240 mg by mouth daily.   zinc oxide 20 % ointment Apply zinc  oxide to buttocks and sacral area every shift and prn       Review of Systems   General does not complaining of fever chills appears appears to be feeling much better than when I saw her previously.  Skin does not complain of rashes or itching.  Head ears eyes nose mouth and throat is complaining of right ear discomfort-does not complain of visual changes-possible mild sore throat.  Respiratory is not complaining of shortness of breath at this time again does have a history of significant COPD does not complain of increased cough from baseline.  Cardiac does not complain of chest pain or palpitations.  GI is not complaining of nausea vomiting diarrhea constipation or abdominal discomfort at this time.  GU does not complaining of dysuria.  Muscle skeletal does have a significant history of chronic pain and frailty at this point pain appears to be controlled but this will have to be watched.  Neurologic is not complaining of dizziness headache or numbness.  Psych she is more alert oriented pleasant and conversant today is not complaining of overt anxiety or depression  Immunization History  Administered Date(s) Administered  . Influenza Split 05/03/2012  . Influenza,inj,Quad PF,36+ Mos 04/18/2014  . Influenza-Unspecified 05/08/2013   Pertinent  Health Maintenance Due  Topic Date Due  . DEXA SCAN  11/14/2001  . PNA vac Low Risk Adult (1 of 2 - PCV13) 11/14/2001  . INFLUENZA VACCINE  105-02-2016 (Originally 02/06/2016)   No flowsheet data found. Functional Status Survey:    Vitals:   02/16/16 1114  BP: (!) 151/71  Pulse: 91  Resp: (!) 22  SpO2: 99%   There is no height or weight on file to calculate BMI. Physical Exam   In general this is a pleasant frail elderly female in no distress resting comfortably in bed she is more alert appears much more comfortable than when I saw her previously.  Her skin is warm and dry.  Eyes sclerae and conjunctivae are clear visual  acuity appears grossly intact pupils are reactive.  Ears right ear does appear to have some inflammation-tympanic membrane is visualized-there is tenderness with palpation or attempted by exam at the ear.--Possibly a small amount of exudate  Her other ear appears to be benign tympanic membrane visualized I do not see any exudate  Oropharynx clear mucous membranes moist.  Chest is clear to auscultation with reduced breath sounds there is no labored breathing.  Heart is regular irregular rate and rate in the 90s on auscultation she does not have significant lower extremity edema.  Muscle skeletal general frailty with arthritic changes diffuse does move all extremities 4-she does have an area on her left buttock status currently covered by Lidoderm patch to her apparently some tenderness to palpation here.  Abdomen is soft and nontender with positive bowel sounds.  Neurologic is grossly intact no lateralizing findings her speech is clear.  Psych she is alert and oriented pleasant and conversant appears again more comfortable than when I saw her previously  Labs reviewed:  Recent Labs  01/25/16 1711  02/09/16 0550 02/14/16 1405 02/16/16 0800  NA 131*  < > 135 131* 137  K 4.8  < > 5.2* 3.2* 4.3  CL 98*  < > 99* 96* 101  CO2 26  < > 31 28 32  GLUCOSE 240*  < > 152* 203* 75  BUN 26*  < > 40* 24* 26*  CREATININE 0.63  < > 0.47 0.57 0.55  CALCIUM 8.6*  < > 8.3* 8.2* 8.1*  MG 1.8  --   --   --   --   < > = values in this interval not displayed.  Recent Labs  02/06/16 2020 02/14/16 1405  AST 19 23  ALT 19 42  ALKPHOS 124 165*  BILITOT 0.6 1.5*  PROT 5.8* 6.6  ALBUMIN 3.2* 3.8    Recent Labs  02/06/16 2020 02/14/16 1405 02/16/16 0800  WBC 10.1 11.4* 10.9*  NEUTROABS 8.0* 10.5* 8.5*  HGB 10.9* 13.4 11.7*  HCT 33.5* 39.9 34.8*  MCV 84.0 81.6 82.7  PLT 285 302 252   Lab Results  Component Value Date   TSH 2.775 01/25/2016   Lab Results  Component Value Date    HGBA1C 6.1 (H) 05/03/2012   Lab Results  Component Value Date   CHOL 186 05/03/2012   HDL 71 05/03/2012   LDLCALC 101 (H) 05/03/2012   TRIG 72 05/03/2012   CHOLHDL 2.6 05/03/2012    Significant Diagnostic Results in last 30 days:  Dg Lumbar Spine Complete  Result Date: 01/24/2016 CLINICAL DATA:  Status post trip and fall this morning with onset of low back pain. Initial encounter. EXAM: LUMBAR SPINE - COMPLETE 4+ VIEW COMPARISON:  CT abdomen and pelvis 06/30/2014. FINDINGS: There is no fracture. Trace anterolisthesis L5 on S1 due to facet arthropathy is unchanged. Intervertebral disc space height is maintained. The patient has an abdominal aortic aneurysm measuring 4.4 cm AP, increased from approximately 3.1 cm on the prior examination. Large colonic stool burden is noted. IMPRESSION: No acute abnormality. 4.4 cm abdominal aortic aneurysm is increased from 3.1 cm on the prior exam. Recommend follow-up ultrasound in 1 year. This recommendation follows ACR consensus guidelines. Large colonic stool burden. Electronically Signed   By: Inge Rise M.D.   On: 01/24/2016 07:12   Dg Pelvis 1-2 Views  Result Date: 01/24/2016 CLINICAL DATA:  Status post trip and fall this morning with onset of pelvic pain. Initial encounter. EXAM: PELVIS - 1-2 VIEW COMPARISON:  CT abdomen and pelvis 06/30/2014. FINDINGS: There is no evidence of pelvic fracture or diastasis. No pelvic bone lesions are seen. Bones are osteopenic. Large colonic stool burden is noted. IMPRESSION: No acute abnormality.  Osteopenia. Large colonic stool burden. Electronically Signed   By: Inge Rise M.D.   On: 01/24/2016 07:13   Ct Chest W Contrast  Result Date: 02/14/2016 CLINICAL DATA:  Abnormal CXR. Pt has been sob but is unable to verbalize time frame. Pt has hx of COPD and irregular heart beat. EXAM: CT CHEST WITH CONTRAST TECHNIQUE: Multidetector CT imaging of the chest was performed during intravenous contrast administration.  CONTRAST:  75 cc Isovue 300 COMPARISON:  02/23/2010 FINDINGS: Cardiovascular: The heart is enlarged. Significant coronary artery calcifications are present. Trace pericardial effusion. There is aberrant arch anatomy. Aberrant right subclavian this significant atherosclerotic calcification at its origin. There is a common trunk for both common carotid arteries and a separate left subclavian artery. Extensive atherosclerotic calcification of the thoracic aorta not associated with aneurysm or dissection. Study was not performed for evaluation of the pulmonary arteries. Mediastinum/Nodes: The visualized portion of the thyroid gland has a normal appearance. Small, nonspecific mediastinal lymph nodes. Pericardial fluid is identified in the superior pericardial recesses. The esophagus is normal in appearance. Lungs/Pleura: Fluid attenuation is identified within right lower lobe bronchi. No enhancing mass is identified in this region. Other bronchi appear patent. There is bibasilar atelectasis. Small bilateral pleural effusions are also present. Upper Abdomen: Bilateral renal cysts. Musculoskeletal: Mild degenerative changes are seen in the spine. IMPRESSION: 1. Mucus plugs or aspiration within right lower lobe bronchi. 2. Bilateral pleural effusions and bibasilar atelectasis. 3. Significant coronary artery disease. 4.  Aortic atherosclerosis. 5. Apparent arch anatomy. 6. Bilateral renal cysts. Electronically Signed   By: Nolon Nations M.D.   On: 02/14/2016 18:37   Ct Lumbar Spine Wo Contrast  Result Date: 01/24/2016 CLINICAL DATA:  Lumbago.  Fall earlier today EXAM: CT LUMBAR SPINE WITHOUT CONTRAST TECHNIQUE: Multidetector CT imaging of the lumbar spine was performed without intravenous contrast administration. Multiplanar CT image reconstructions were also generated. COMPARISON:  CT abdomen and pelvis with bony reformats June 30, 2014; lumbar radiographs January 24, 2016 FINDINGS: Bones are osteoporotic. There is  no evidence of acute fracture or spondylolisthesis. There is slight disc space narrowing at T12-L1. Other disc spaces appear unremarkable. At T12-L1, there is mild generalized disc bulging. There is mild facet hypertrophy bilaterally. There is no nerve root edema or effacement. No disc extrusion or stenosis. At L1-2, there is moderate facet hypertrophy bilaterally. There is mild diffuse disc bulging. There is no nerve root edema or effacement. No disc extrusion or stenosis. At L2-3, there is moderate facet hypertrophy bilaterally and mild diffuse disc bulging. No nerve root edema or effacement. No disc extrusion or stenosis. At L3-4, there is moderate facet hypertrophy bilaterally and mild diffuse disc bulging. No nerve root edema or effacement. No disc extrusion or stenosis. At L4-5, there is a broad-based disc bulging and moderate facet hypertrophy bilaterally. There is mild ligamentum flava hypertrophy bilaterally causing borderline lateral canal stenosis and L4-5 bilaterally. No central canal stenosis noted. No disc extrusion. No nerve root edema or effacement evident. At L5-S1, there is moderate facet hypertrophy bilaterally. There is broad-based disc protrusion without appreciable nerve root edema or effacement. Node disc extrusion or stenosis evident. There is stable calcification between the L2 and L3 spinous processes, possibly residua of old trauma. There is slight lumbar levoscoliosis. There is degenerative change in the sacroiliac joints bilaterally, slightly more severe on the right than on the left. There is extensive calcification in the distal aorta and common iliac arteries. There is aneurysmal dilatation of the distal abdominal  aorta with a maximum transverse diameter of 4.3 x 4.0 cm. This aneurysm extends to just above the bifurcation. It arises inferior to the renal arteries. There is no periaortic fluid. IMPRESSION: No fracture or spondylolisthesis. Multilevel arthropathy. Borderline lateral  canal stenosis at L4-5, multifactorial. No central stenosis. No disc extrusion or nerve root edema/effacement. Abdominal aortic aneurysm with maximum transverse diameter of 4.3 x 4.0 cm. Recommend followup by ultrasound in 1 year. This recommendation follows ACR consensus guidelines: White Paper of the ACR Incidental Findings Committee II on Vascular Findings. J Am Coll Radiol 2013; 10:789-794. As this aneurysm has increased in size compared to prior study from December 2015, vascular surgery consultation may also be advisable at this time. Bones osteoporotic. Electronically Signed   By: Lowella Grip III M.D.   On: 01/24/2016 10:14   Dg Chest Port 1 View  Result Date: 02/14/2016 CLINICAL DATA:  Irregular heartbeat, shortness of breath, history COPD, atrial fibrillation, hypertension EXAM: PORTABLE CHEST 1 VIEW COMPARISON:  Portable exam 1455 hours compared 02/06/2016 FINDINGS: Normal heart size and mediastinal contours. Atherosclerotic calcification aorta. Slight pulmonary vascular congestion. Increased atelectasis versus consolidation in RIGHT lower lobe. Remaining lungs demonstrate chronic changes and question underlying COPD. No acute infiltrate, pleural effusion or pneumothorax. Bones demineralized. IMPRESSION: Probable COPD changes with increased atelectasis versus consolidation in RIGHT lower lobe. Electronically Signed   By: Lavonia Dana M.D.   On: 02/14/2016 15:31   Dg Chest Portable 1 View  Result Date: 02/06/2016 CLINICAL DATA:  81 year old female with increasing cough and pain. Initial encounter. EXAM: PORTABLE CHEST 1 VIEW COMPARISON:  11/08/2014 and earlier. FINDINGS: Portable AP semi upright view at 1951 hours. Chronic pulmonary hyperinflation. Patchy increased bibasilar opacity slightly greater on the right. No pneumothorax or pulmonary edema. No definite pleural effusion. Stable cardiac size and mediastinal contours. Ir calcified aortic atherosclerosis. Visualized tracheal air column is  within normal limits. IMPRESSION: 1. chronic hyperinflation with patchy acute bibasilar opacity. Favor acute infectious exacerbation. No definite pleural effusion. 2. Calcified aortic atherosclerosis. Electronically Signed   By: Genevie Ann M.D.   On: 02/06/2016 20:12   Dg Hips Bilat With Pelvis Min 5 Views  Result Date: 02/06/2016 CLINICAL DATA:  Fall at home 01/24/2016 (2 weeks prior), persistent bilateral hip pain since fall. EXAM: DG HIP (WITH OR WITHOUT PELVIS) 5+V BILAT COMPARISON:  Pelvis radiographs 01/24/2016 FINDINGS: The cortical margins of the bony pelvis and both hips are intact. No acute or healing fracture. Pubic symphysis and sacroiliac joints are congruent. Both femoral heads are well-seated in the respective acetabula. Bony under mineralization again seen. Large stool burden. Atherosclerosis and vascular stents. IMPRESSION: 1. No pelvic fracture. 2. Osteopenia. Electronically Signed   By: Jeb Levering M.D.   On: 02/06/2016 22:54    Assessment/Plan  #1-history of tachycardia this appears to be stabilized she is on verapamil she continues on Pradaxa for anticoagulation at this point will monitor she appears to be much more comfortable rate appears to be controlled   #2 history COPD with recent history of pneumonia she is completing a course of Levaquin she also continues on prednisone-continues on Xopenex nebulizers as well as Spiriva-this appears to have stabilized but certainly will warrant close monitoring.  #3 suspected right ear infection-she has had this before apparently responded well to amoxicillin-will write an order for amoxicillin 500 mg 3 times a day for 7 days---pharmacy has noted that she does have an allergy to cephalosporins and apparently amoxicillin-nursing staff will contact family however to see  the extent of this since apparently she has been on this before and tolerated well with resolution of her ear infection-at this point monitor but appears she has tolerated  this again in the past without incident   #4- History of hypertension as she is on hydralazine I see variable blood pressures recently 151/71-124/92 would like to get more readings before making changes does not appear she has consistent elevations but this will have to be watched.  #5 history of peripheral vascular disease she continues on Pletal  #6-history of chronic pain more so on of her back lumbar area CT scan of the lumbar area has not shown any acute process-at this point continue Vicodin as needed-I note she also has a Lidoderm patch--this appears to be complicated by numerous allergies including tramadol and Percocet.  #7 history of tenderness buttocks area-I have spoken with the wound care nurse about follow-up of this-and she will evaluate Of note Will update a CBC and basic metabolic panel next week to keep an eye on her electrolytes she does have a mildly elevated white count but this appears to be somewhat chronic recently she is on prednisone and  will update lab work for follow-up   [id number]-of note greater than 40 minutes spent assessing patient-reviewing her chart-reviewing her labs-discussing her status at bedside with her daughters-and coordinating and formulating a plan of care for numerous diagnoses-of note greater than 50% of time spent coordinating plan of care       Oralia Manis, [place]

## 2016-02-19 ENCOUNTER — Encounter (HOSPITAL_COMMUNITY)
Admission: RE | Admit: 2016-02-19 | Discharge: 2016-02-19 | Disposition: A | Discharge status: Home / Self Care | Payer: Medicare Other | Source: Skilled Nursing Facility | Attending: Internal Medicine | Admitting: Internal Medicine

## 2016-02-19 LAB — COMPREHENSIVE METABOLIC PANEL
ALBUMIN: 2.6 g/dL — AB (ref 3.5–5.0)
ALT: 19 U/L (ref 14–54)
AST: 18 U/L (ref 15–41)
Alkaline Phosphatase: 115 U/L (ref 38–126)
Anion gap: 5 (ref 5–15)
BUN: 19 mg/dL (ref 6–20)
CHLORIDE: 102 mmol/L (ref 101–111)
CO2: 27 mmol/L (ref 22–32)
Calcium: 7.6 mg/dL — ABNORMAL LOW (ref 8.9–10.3)
Creatinine, Ser: 0.37 mg/dL — ABNORMAL LOW (ref 0.44–1.00)
GFR calc Af Amer: >60 mL/min (ref >60)
GLUCOSE: 74 mg/dL (ref 65–99)
POTASSIUM: 3.4 mmol/L — AB (ref 3.5–5.1)
SODIUM: 134 mmol/L — AB (ref 135–145)
Total Bilirubin: 0.6 mg/dL (ref 0.3–1.2)
Total Protein: 4.8 g/dL — ABNORMAL LOW (ref 6.5–8.1)

## 2016-02-19 LAB — CBC WITH DIFFERENTIAL/PLATELET
Basophils Absolute: 0 10*3/uL (ref 0.0–0.1)
Basophils Relative: 0 %
EOS PCT: 1 %
Eosinophils Absolute: 0.1 10*3/uL (ref 0.0–0.7)
HEMATOCRIT: 36.2 % (ref 36.0–46.0)
HEMOGLOBIN: 12.3 g/dL (ref 12.0–15.0)
LYMPHS ABS: 1.1 10*3/uL (ref 0.7–4.0)
Lymphocytes Relative: 11 %
MCH: 28.4 pg (ref 26.0–34.0)
MCHC: 34 g/dL (ref 30.0–36.0)
MCV: 83.6 fL (ref 78.0–100.0)
MONOS PCT: 11 %
Monocytes Absolute: 1.1 10*3/uL — ABNORMAL HIGH (ref 0.1–1.0)
Neutro Abs: 8.1 10*3/uL — ABNORMAL HIGH (ref 1.7–7.7)
Neutrophils Relative %: 77 %
Platelets: 173 10*3/uL (ref 150–400)
RBC: 4.33 MIL/uL (ref 3.87–5.11)
RDW: 18.5 % — ABNORMAL HIGH (ref 11.5–15.5)
WBC: 10.4 10*3/uL (ref 4.0–10.5)

## 2016-02-20 ENCOUNTER — Non-Acute Institutional Stay (SKILLED_NURSING_FACILITY): Payer: Medicare Other | Admitting: Internal Medicine

## 2016-02-20 ENCOUNTER — Encounter: Payer: Self-pay | Admitting: Internal Medicine

## 2016-02-20 DIAGNOSIS — I714 Abdominal aortic aneurysm, without rupture, unspecified: Secondary | ICD-10-CM

## 2016-02-20 DIAGNOSIS — I482 Chronic atrial fibrillation, unspecified: Secondary | ICD-10-CM

## 2016-02-20 DIAGNOSIS — E43 Unspecified severe protein-calorie malnutrition: Secondary | ICD-10-CM

## 2016-02-20 DIAGNOSIS — J438 Other emphysema: Secondary | ICD-10-CM | POA: Diagnosis not present

## 2016-02-20 DIAGNOSIS — E876 Hypokalemia: Secondary | ICD-10-CM | POA: Diagnosis not present

## 2016-02-20 NOTE — Progress Notes (Signed)
Provider:  Veleta Miners Location:   [facility] Room Number: 132/P Place of Service:  SNF (31)  PCP: Glo Herring., MD Patient Care Team: Redmond School, MD as PCP - General (Internal Medicine) [name], MD as Consulting Physician (Gastroenterology) Minus Breeding, MD as Consulting Physician (Cardiology)  Extended Emergency Contact Information Primary Emergency Contact: Arnette Schaumann [place] of [place] Phone: [phone] Relation: Daughter Secondary Emergency Contact: Milinda Cave [place] of [place] Phone: [phone] Relation: Daughter  Code Status: Full Code Goals of Care: Advanced Directive information Advanced Directives 02/20/2016  Does patient have an advance directive? Yes  Type of Advance Directive (No Data)  Does patient want to make changes to advanced directive? No - Patient declined  Copy of advanced directive(s) in chart? Yes  Would patient like information on creating an advanced directive? -  Pre-existing out of facility DNR order (yellow form or pink MOST form) -      Chief Complaint  Patient presents with  . New Admit To SNF    HPI: Patient is a 81 y.o. female seen today for admission for rehab in the facility. Patient was initially admitted to hospital after she fell on her Nasal canula and had acute low back pain. Her CTScan did not show any acute abnormality. She was discharged home with Home health care. Patient was readmitted with reparatory distress and was diagnosed with Community acquired Pneumonia. She is discharged to SNF for Rehab. Patient did not have any c/o pain today. She was started on antibiotics for her ear pain. Her ear pain is resolved also.  Past Medical History:  Diagnosis Date  . AAA (abdominal aortic aneurysm) without rupture ([place]) 01/24/2016   4.3x4.0 cm per CT   . Atrial fibrillation ([facility])   . Cerebrovascular disease   . COPD (chronic obstructive pulmonary disease)  ([place])   . CVA 10/25/2008   Qualifier: Diagnosis of  By: [name], CNA, Christy    . DVT (deep venous thrombosis) ([place])   . GERD (gastroesophageal reflux disease)   . Hypertension   . Internal carotid artery stenosis 05/13/2012   HIGH GRADE LEFT INTERNAL CAROTID STENOSIS.  S/p  L CEA 05/11/12  . Irregular heart beat   . PAD (peripheral artery disease) ([facility])    lower extremity PAD with bilateral leg stenting  . TIA (transient ischemic attack)    Past Surgical History:  Procedure Laterality Date  . APPENDECTOMY  1956  . CAROTID ANGIOGRAM N/A 05/06/2012   Procedure: CAROTID ANGIOGRAM;  Surgeon: [name], MD;  Location: [facility] CATH LAB;  Service: Cardiovascular;  Laterality: N/A;  . CAROTID ENDARTERECTOMY Left Nov. 4, 2013   CE  . CHOLECYSTECTOMY  1966   Gall Bladder  . COLONOSCOPY N/A 07/12/2014   [id number] cecal polyp/one colon polyp/redundant left colon  . ENDARTERECTOMY  05/11/2012   Procedure: ENDARTERECTOMY CAROTID;  Surgeon: [name], MD;  Location: [facility] OR;  Service: Vascular;  Laterality: Left;  . ESOPHAGOGASTRODUODENOSCOPY N/A 07/12/2014   [id number] in the lower thrid of the esophagus  . EYE SURGERY Left Dec. 22, 2016   Cataract  . GALLBLADDER SURGERY    . MALONEY DILATION N/A 07/12/2014   Procedure: MALONEY DILATION;  Surgeon: [name], MD;  Location: [facility];  Service: Endoscopy;  Laterality: N/A;  . PARTIAL HYSTERECTOMY    . PATCH ANGIOPLASTY  05/11/2012   Procedure: PATCH ANGIOPLASTY;  Surgeon: [name], MD;  Location: [facility];  Service: Vascular;  Laterality:  Left;  with Dacron Patch Angioplasty  . SAVORY DILATION N/A 07/12/2014   Procedure: SAVORY DILATION;  Surgeon: [name], MD;  Location: [facility];  Service: Endoscopy;  Laterality: N/A;    reports that she quit smoking about 1 years ago. She smoked 1.00 pack per day. She has quit using smokeless tobacco. She reports that she does not drink alcohol or use drugs. Social History   Social  History  . Marital status: Married    Spouse name: N/A  . Number of children: N/A  . Years of education: N/A   Occupational History  . unemployed Retired   Social History Main Topics  . Smoking status: Former Smoker    Packs/day: 1.00    Quit date: 02/23/2014  . Smokeless tobacco: Former Systems developer  . Alcohol use No  . Drug use: No  . Sexual activity: No   Other Topics Concern  . Not on file   Social History Narrative  . No narrative on file    Functional Status Survey:    Family History  Problem Relation Age of Onset  . Deep vein thrombosis Sister     Blood Clot in leg  . Alzheimer's disease Brother   . COPD Sister   . COPD Sister     Health Maintenance  Topic Date Due  . Samul Dada  11/15/1953  . ZOSTAVAX  11/14/1996  . DEXA SCAN  11/14/2001  . PNA vac Low Risk Adult (1 of 2 - PCV13) 11/14/2001  . INFLUENZA VACCINE  12020/09/115 (Originally 02/06/2016)    Allergies  Allergen Reactions  . Ibuprofen     Gi upset  . Pnu-Imune [Pneumococcal Polysaccharide Vaccine]     Huge knot at site and rash  . Advair Diskus [Fluticasone-Salmeterol]     Confusion and shortness of breath  . Albuterol     Confusion and shortness of breath  . Amoxicillin Hives  . Cephalexin Hives  . Codeine     confusion  . Entex Pac [Pseudoephedrine-Gg & Dm] Other (See Comments)    Upper respiratory.   . Etanercept     vertigo  . Gualenic Acid Other (See Comments)    unknown  . Lorazepam     hallucinations  . Nubain [Nalbuphine Hcl]   . Oxycodone Hcl Nausea And Vomiting  . Oxycodone-Acetaminophen Nausea And Vomiting  . Roxicodone [Oxycodone Hcl]   . Tramadol     hallucinations   . Tylox [Oxycodone-Acetaminophen]       Medication List       Accurate as of 02/20/16  1:58 PM. Always use your most recent med list.          acetaminophen 325 MG tablet Commonly known as:  TYLENOL Take 650 mg by mouth every 4 (four) hours as needed for mild pain or fever.   amoxicillin 500 MG  tablet Commonly known as:  AMOXIL Give 500 mg by mouth three times a day for 7 days.   cilostazol 50 MG tablet Commonly known as:  PLETAL Take 50 mg by mouth 2 (two) times daily.   cyclobenzaprine 5 MG tablet Commonly known as:  FLEXERIL Take 1 tablet (5 mg total) by mouth 3 (three) times daily.   dabigatran 150 MG Caps capsule Commonly known as:  PRADAXA Take 150 mg by mouth 2 (two) times daily.   hydrALAZINE 10 MG tablet Commonly known as:  APRESOLINE Take 1 tablet (10 mg total) by mouth 2 (two) times daily. New medication to help with treatment of  your high blood pressure.   HYDROcodone-acetaminophen 5-325 MG tablet Commonly known as:  NORCO/VICODIN Take 1 tablet by mouth every 6 (six) hours as needed for moderate pain. DO NOT EXCEED 4GM OF APAP IN 24 HOURS FROM ALL SOURCES   levalbuterol 0.63 MG/3ML nebulizer solution Commonly known as:  XOPENEX Take 3 mLs (0.63 mg total) by nebulization every 4 (four) hours as needed for wheezing or shortness of breath.   lidocaine 4 % cream Commonly known as:  LMX Apply 1 application topically daily as needed (for pain (hips/back)).   LIDODERM 5 % Generic drug:  lidocaine Place 1 patch onto the skin daily. Remove & Discard patch within 12 hours or as directed by MD   multivitamin with minerals Tabs tablet Take 1 tablet by mouth daily.   NON FORMULARY [place]ozy shack pudding twice a day in between meals   NON FORMULARY Magic cup should come on meal tray. Give once a day   OXYGEN 3 L/mn as needed every shift   pantoprazole 40 MG tablet Commonly known as:  PROTONIX Take 1 tablet (40 mg total) by mouth daily.   senna-docusate 8.6-50 MG tablet Commonly known as:  Senokot-S Take 2 tablets by mouth at bedtime. For constipation   SPIRIVA HANDIHALER 18 MCG inhalation capsule Generic drug:  tiotropium Place 10 mcg into inhaler and inhale at bedtime   verapamil 240 MG 24 hr capsule Commonly known as:  VERELAN PM Take 240 mg by  mouth daily.   zinc oxide 20 % ointment Apply zinc oxide to buttocks and sacral area every shift and prn       Review of Systems  Constitutional: Negative for activity change, appetite change, chills, diaphoresis and fatigue.  HENT: Negative.   Respiratory: Positive for cough and shortness of breath. Negative for choking and chest tightness.   Cardiovascular: Negative for chest pain, palpitations and leg swelling.  Gastrointestinal: Negative for abdominal distention, abdominal pain and blood in stool.    Vitals:   02/20/16 1325  BP: (!) 110/58  Pulse: 78  Resp: 16  Temp: 97.8 F (36.6 C)  TempSrc: Oral  SpO2: 96%   There is no height or weight on file to calculate BMI. Physical Exam  Constitutional: She is oriented to person, place, and time.  HENT:  Head: Normocephalic.  Neck: Neck supple.  Cardiovascular:  No murmur heard. Irregular rate  Pulmonary/Chest:  Doesn't move air. Mild rales in lower lobes B/L  Abdominal: Bowel sounds are normal. There is no tenderness.  Musculoskeletal: She exhibits no edema or tenderness.  Neurological: She is oriented to person, place, and time.  Patient seems little drowzy    Labs reviewed: Basic Metabolic Panel:  Recent Labs  01/25/16 1711  02/14/16 1405 02/16/16 0800 02/19/16 1030  NA 131*  < > 131* 137 134*  K 4.8  < > 3.2* 4.3 3.4*  CL 98*  < > 96* 101 102  CO2 26  < > 28 32 27  GLUCOSE 240*  < > 203* 75 74  BUN 26*  < > 24* 26* 19  CREATININE 0.63  < > 0.57 0.55 0.37*  CALCIUM 8.6*  < > 8.2* 8.1* 7.6*  MG 1.8  --   --   --   --   < > = values in this interval not displayed. Liver Function Tests:  Recent Labs  02/06/16 2020 02/14/16 1405 02/19/16 1030  AST 19 23 18   ALT 19 42 19  ALKPHOS 124 165* 115  BILITOT 0.6 1.5* 0.6  PROT 5.8* 6.6 4.8*  ALBUMIN 3.2* 3.8 2.6*   No results for input(s): LIPASE, AMYLASE in the last 8760 hours. No results for input(s): AMMONIA in the last 8760 hours. CBC:  Recent  Labs  02/14/16 1405 02/16/16 0800 02/19/16 1030  WBC 11.4* 10.9* 10.4  NEUTROABS 10.5* 8.5* 8.1*  HGB 13.4 11.7* 12.3  HCT 39.9 34.8* 36.2  MCV 81.6 82.7 83.6  PLT 302 252 173   Cardiac Enzymes:  Recent Labs  02/14/16 1405  TROPONINI <0.03   BNP: Invalid input(s): POCBNP Lab Results  Component Value Date   HGBA1C 6.1 (H) 05/03/2012   Lab Results  Component Value Date   TSH 2.775 01/25/2016   No results found for: VITAMINB12 No results found for: FOLATE No results found for: IRON, TIBC, FERRITIN   Assessment/Plan  1. Low back pain  Patient pain is controlled on flexeril, Vicodin and tylenol. Will change her Flexeril to PRN due to her drowsiness. Continue Physical therapy.  2. COPD  Continue on Inhalers and Oxygen. She is off her prednisone.  3. Atrial Fibrillation  Rate controlled. Continue Pradaxa.  4. Failure to thrive  Patients Albumin is 2.6 Nutrition consult with Protein supplement.  5. Potassium is 3.4. Repeat CMP in 1 week to follow.

## 2016-02-22 ENCOUNTER — Encounter (HOSPITAL_COMMUNITY)
Admission: RE | Admit: 2016-02-22 | Discharge: 2016-02-22 | Disposition: A | Discharge status: Home / Self Care | Payer: Medicare Other | Source: Skilled Nursing Facility | Attending: *Deleted | Admitting: *Deleted

## 2016-02-22 LAB — BASIC METABOLIC PANEL
Anion gap: 6 (ref 5–15)
BUN: 18 mg/dL (ref 6–20)
CHLORIDE: 97 mmol/L — AB (ref 101–111)
CO2: 30 mmol/L (ref 22–32)
Calcium: 8 mg/dL — ABNORMAL LOW (ref 8.9–10.3)
Creatinine, Ser: 0.52 mg/dL (ref 0.44–1.00)
GFR calc Af Amer: >60 mL/min (ref >60)
GFR calc non Af Amer: >60 mL/min (ref >60)
GLUCOSE: 91 mg/dL (ref 65–99)
POTASSIUM: 4.1 mmol/L (ref 3.5–5.1)
Sodium: 133 mmol/L — ABNORMAL LOW (ref 135–145)

## 2016-02-25 NOTE — Progress Notes (Signed)
This encounter was created in error - please disregard.

## 2016-02-28 ENCOUNTER — Encounter (HOSPITAL_COMMUNITY)
Admission: RE | Admit: 2016-02-28 | Discharge: 2016-02-28 | Disposition: A | Discharge status: Home / Self Care | Payer: Medicare Other | Source: Skilled Nursing Facility | Attending: *Deleted | Admitting: *Deleted

## 2016-02-28 LAB — BASIC METABOLIC PANEL
ANION GAP: 8 (ref 5–15)
BUN: 16 mg/dL (ref 6–20)
CHLORIDE: 101 mmol/L (ref 101–111)
CO2: 29 mmol/L (ref 22–32)
Calcium: 8.1 mg/dL — ABNORMAL LOW (ref 8.9–10.3)
Creatinine, Ser: 0.44 mg/dL (ref 0.44–1.00)
GFR calc Af Amer: >60 mL/min (ref >60)
GLUCOSE: 89 mg/dL (ref 65–99)
POTASSIUM: 3.3 mmol/L — AB (ref 3.5–5.1)
SODIUM: 138 mmol/L (ref 135–145)

## 2016-03-04 ENCOUNTER — Other Ambulatory Visit (HOSPITAL_COMMUNITY)
Admission: RE | Admit: 2016-03-04 | Discharge: 2016-03-04 | Disposition: A | Discharge status: Home / Self Care | Payer: Medicare Other | Source: Skilled Nursing Facility | Attending: Internal Medicine | Admitting: Internal Medicine

## 2016-03-04 DIAGNOSIS — J9611 Chronic respiratory failure with hypoxia: Secondary | ICD-10-CM | POA: Insufficient documentation

## 2016-03-04 LAB — BASIC METABOLIC PANEL
ANION GAP: 5 (ref 5–15)
BUN: 20 mg/dL (ref 6–20)
CO2: 31 mmol/L (ref 22–32)
Calcium: 8.3 mg/dL — ABNORMAL LOW (ref 8.9–10.3)
Chloride: 103 mmol/L (ref 101–111)
Creatinine, Ser: 0.42 mg/dL — ABNORMAL LOW (ref 0.44–1.00)
GFR calc Af Amer: >60 mL/min (ref >60)
GFR calc non Af Amer: >60 mL/min (ref >60)
GLUCOSE: 83 mg/dL (ref 65–99)
POTASSIUM: 3.3 mmol/L — AB (ref 3.5–5.1)
Sodium: 139 mmol/L (ref 135–145)

## 2016-03-05 ENCOUNTER — Encounter: Payer: Self-pay | Admitting: Internal Medicine

## 2016-03-05 ENCOUNTER — Non-Acute Institutional Stay (SKILLED_NURSING_FACILITY): Payer: Medicare Other | Admitting: Internal Medicine

## 2016-03-05 DIAGNOSIS — J9601 Acute respiratory failure with hypoxia: Secondary | ICD-10-CM | POA: Diagnosis not present

## 2016-03-05 DIAGNOSIS — M898X9 Other specified disorders of bone, unspecified site: Secondary | ICD-10-CM | POA: Diagnosis not present

## 2016-03-05 DIAGNOSIS — E876 Hypokalemia: Secondary | ICD-10-CM | POA: Diagnosis not present

## 2016-03-05 NOTE — Progress Notes (Signed)
Location:   [facility] Room Number: 132/P Place of Service:  SNF (31) Provider:  Amado Coe., MD  Patient Care Team: Redmond School, MD as PCP - General (Internal Medicine) [name], MD as Consulting Physician (Gastroenterology) Minus Breeding, MD as Consulting Physician (Cardiology)  Extended Emergency Contact Information Primary Emergency Contact: Arnette Schaumann [place] of [place] Phone: [phone] Relation: Daughter Secondary Emergency Contact: Knight,Janet  [place] Phone: [phone] Relation: Daughter  Code Status:  Full Code Goals of care: Advanced Directive information Advanced Directives 03/05/2016  Does patient have an advance directive? Yes  Type of Advance Directive (No Data)  Does patient want to make changes to advanced directive? No - Patient declined  Copy of advanced directive(s) in chart? Yes  Would patient like information on creating an advanced directive? -  Pre-existing out of facility DNR order (yellow form or pink MOST form) -     Chief Complaint  Patient presents with  . Acute Visit    Lump in back   Also follow-up hypokalemia HPI:  Pt is a 81 y.o. female seen today for an acute visit for Complaints of a lump in her left lower back-this was noted by her therapist-I'm following up on this. Patient was initially admitted to hospital after falling with acute low back pain-CT scan did not show any acute abnormality she was discharged with home health-she was later readmitted with respiratory distress and diagnosed with community-acquired pneumonia.  She is here for rehabilitation and actually appears to be doing fairly well considering her numerous comorbidities.  Her therapist says that she has noted a lump on her left lower back that is tender to touch.  Speaking with the patient she says this has been here since her fall.  I did review previous x-rays including  one done 02/06/2016 of her pelvis that did not really show any acute process.  Her previous one on 01/18/2016 did not show any acute process it did show an incidental abdominal aortic aneurysm 4.3 x 4 cm with recommendation to follow-up in one year.  We've also been slowly increasing her potassium supplementation-she has had variable potassium readings during her stay here but recently appears to run borderline low she was 3.3 last week we did start her on 10 mEq potassium nonetheless most recent lab this week continues to show low potassium of 3.3.  She is not on any diuretic.         Past Medical History:  Diagnosis Date  . AAA (abdominal aortic aneurysm) without rupture ([place]) 01/24/2016   4.3x4.0 cm per CT   . Atrial fibrillation ([place])   . Cerebrovascular disease   . COPD (chronic obstructive pulmonary disease) ([place])   . CVA 10/25/2008   Qualifier: Diagnosis of  By: [name], CNA, Christy    . DVT (deep venous thrombosis) ([place])   . GERD (gastroesophageal reflux disease)   . Hypertension   . Internal carotid artery stenosis 05/13/2012   HIGH GRADE LEFT INTERNAL CAROTID STENOSIS.  S/p  L CEA 05/11/12  . Irregular heart beat   . PAD (peripheral artery disease) ([facility])    lower extremity PAD with bilateral leg stenting  . TIA (transient ischemic attack)    Past Surgical History:  Procedure Laterality Date  . APPENDECTOMY  1956  . CAROTID ANGIOGRAM N/A 05/06/2012   Procedure: CAROTID ANGIOGRAM;  Surgeon: [name], MD;  Location: [facility] CATH LAB;  Service: Cardiovascular;  Laterality: N/A;  .  CAROTID ENDARTERECTOMY Left Nov. 4, 2013   CE  . CHOLECYSTECTOMY  1966   Gall Bladder  . COLONOSCOPY N/A 07/12/2014   [id number] cecal polyp/one colon polyp/redundant left colon  . ENDARTERECTOMY  05/11/2012   Procedure: ENDARTERECTOMY CAROTID;  Surgeon: [name], MD;  Location: [facility] OR;  Service: Vascular;  Laterality: Left;  . ESOPHAGOGASTRODUODENOSCOPY N/A 07/12/2014   [id number] in  the lower thrid of the esophagus  . EYE SURGERY Left Dec. 22, 2016   Cataract  . GALLBLADDER SURGERY    . MALONEY DILATION N/A 07/12/2014   Procedure: MALONEY DILATION;  Surgeon: [name], MD;  Location: [facility] END[facility] SU[facility];  Service: Endoscopy;  Laterality: N/A;  . PARTIAL HYSTERECTOMY    . PATCH ANGIOPLASTY  05/11/2012   Procedure: PATCH ANGIOPLASTY;  Surgeon: [name], MD;  Location: [facility] OR;  Service: Vascular;  Laterality: Left;  with Dacron Patch Angioplasty  . SAVORY DILATION N/A 07/12/2014   Procedure: SAVORY DILATION;  Surgeon: [name], MD;  Location: [facility] ENDO SUITE;  Service: Endoscopy;  Laterality: N/A;    Allergies  Allergen Reactions  . Ibuprofen     Gi upset  . Pnu-Imune [Pneumococcal Polysaccharide Vaccine]     Huge knot at site and rash  . Advair Diskus [Fluticasone-Salmeterol]     Confusion and shortness of breath  . Albuterol     Confusion and shortness of breath  . Amoxicillin Hives  . Cephalexin Hives  . Codeine     confusion  . Entex Pac [Pseudoephedrine-Gg & Dm] Other (See Comments)    Upper respiratory.   . Etanercept     vertigo  . Gualenic Acid Other (See Comments)    unknown  . Lorazepam     hallucinations  . Nubain [Nalbuphine Hcl]   . Oxycodone Hcl Nausea And Vomiting  . Oxycodone-Acetaminophen Nausea And Vomiting  . Roxicodone [Oxycodone Hcl]   . Tramadol     hallucinations   . Tylox [Oxycodone-Acetaminophen]       Medication List       Accurate as of 03/05/16  3:56 PM. Always use your most recent med list.          acetaminophen 325 MG tablet Commonly known as:  TYLENOL Take 650 mg by mouth every 4 (four) hours as needed for mild pain or fever.   BIOFREEZE 4 % Gel Generic drug:  Menthol (Topical Analgesic) Apply to both hips for pain Q shift PRN   cilostazol 50 MG tablet Commonly known as:  PLETAL Take 50 mg by mouth 2 (two) times daily.   cyclobenzaprine 5 MG tablet Commonly known as:  FLEXERIL Take 1 tablet (5  mg total) by mouth 3 (three) times daily.   dabigatran 150 MG Caps capsule Commonly known as:  PRADAXA Take 150 mg by mouth 2 (two) times daily.   hydrALAZINE 10 MG tablet Commonly known as:  APRESOLINE Take 1 tablet (10 mg total) by mouth 2 (two) times daily. New medication to help with treatment of your high blood pressure.   levalbuterol 0.63 MG/3ML nebulizer solution Commonly known as:  XOPENEX Take 3 mLs (0.63 mg total) by nebulization every 4 (four) hours as needed for wheezing or shortness of breath.   lidocaine 4 % cream Commonly known as:  LMX Apply 1 application topically daily as needed (for pain (hips/back)).   LIDODERM 5 % Generic drug:  lidocaine Place 1 patch onto the skin daily. Remove & Discard patch within 12 hours or as  directed by MD   multivitamin with minerals Tabs tablet Take 1 tablet by mouth daily.   NON FORMULARY [place]ozy shack pudding twice a day in between meals   NON FORMULARY Magic cup should come on meal tray. Give once a day   OXYGEN 3 L/mn as needed every shift   pantoprazole 40 MG tablet Commonly known as:  PROTONIX Take 1 tablet (40 mg total) by mouth daily.   potassium chloride SA 20 MEQ tablet Commonly known as:  K-DUR,KLOR-CON Take 20 mEq by mouth daily.   PREPARATION H HYDROCORTISONE 1 % Generic drug:  hydrocortisone cream Administer until resolved to external hemorrhoid every shift   PROSTAT PO 30 ml by mouth Twice a day   senna-docusate 8.6-50 MG tablet Commonly known as:  Senokot-S Take 2 tablets by mouth at bedtime. For constipation   SPIRIVA HANDIHALER 18 MCG inhalation capsule Generic drug:  tiotropium Place 10 mcg into inhaler and inhale at bedtime   verapamil 240 MG 24 hr capsule Commonly known as:  VERELAN PM Take 240 mg by mouth daily.   zinc oxide 20 % ointment Apply zinc oxide to buttocks and sacral area every shift and prn       Review of Systems  General does not complaining of fever chills   Skin  does not complain of rashes or itching.  Head ears eyes nose mouth and throat--when I last saw her she was complaining of ear rpain however this has resolved with antibiotic  Respiratory is not complaining of shortness of breath at this time again does have a history of significant COPD does not complain of increased cough from baseline.  Cardiac does not complain of chest pain or palpitations.  GI is not complaining of nausea vomiting diarrhea constipation or abdominal discomfort at this time.  GU does not complaining of dysuria.  Muscle skeletal does have a significant history of chronic pain and frailty at this point pain appears to be controlled but this only appears well controlled.  However she does complain at times of some left lateral low back pain she says it kind of hurts when she lies on the "lump" that has been there since her fall several weeks ago  Neurologic is not complaining of dizziness headache or numbness.  Psych she is  alert oriented pleasant and conversant today is not complaining of overt anxiety or depression   Immunization History  Administered Date(s) Administered  . Influenza Split 05/03/2012  . Influenza,inj,Quad PF,36+ Mos 04/18/2014  . Influenza-Unspecified 05/08/2013   Pertinent  Health Maintenance Due  Topic Date Due  . DEXA SCAN  11/14/2001  . PNA vac Low Risk Adult (1 of 2 - PCV13) 11/14/2001  . INFLUENZA VACCINE  12020/10/2915 (Originally 02/06/2016)   No flowsheet data found. Functional Status Survey:    Vitals:   03/05/16 1536  BP: (!) 103/56  Pulse: 63  Resp: 19  Temp: 97.4 F (36.3 C)  TempSrc: Oral   There is no height or weight on file to calculate BMI. Physical Exam In general this is a pleasant frail elderly female in no distress resting comfortably in bed  .  Her skin is warm and dry.  Eyes sclerae and conjunctivae are clear visual acuity appears grossly intact pupils are reactive.      Oropharynx clear  mucous membranes moist.  Chest is clear to auscultation with reduced breath sounds there is no labored breathing.  Heart is regular irregular rate and rhythm she does not have significant lower  extremity edema.  Muscle skeletal general frailty with arthritic changes diffuse does move all extremities 4 On the lateral left low back there is somewhat of a hardened area that does not visually protrudes significantly however does appear to have some protrusion on palpation this is hard somewhat tender to palpation there is no erythema here and it it is flesh-colored appears bony.  Abdomen is soft and nontender with positive bowel sounds.  Neurologic is grossly intact no lateralizing findings her speech is clear.  Psych she is alert and oriented pleasant and conversant   Labs reviewed:  Recent Labs  01/25/16 1711  02/22/16 0725 02/28/16 0715 03/04/16 0612  NA 131*  < > 133* 138 139  K 4.8  < > 4.1 3.3* 3.3*  CL 98*  < > 97* 101 103  CO2 26  < > 30 29 31   GLUCOSE 240*  < > 91 89 83  BUN 26*  < > 18 16 20   CREATININE 0.63  < > 0.52 0.44 0.42*  CALCIUM 8.6*  < > 8.0* 8.1* 8.3*  MG 1.8  --   --   --   --   < > = values in this interval not displayed.  Recent Labs  02/06/16 2020 02/14/16 1405 02/19/16 1030  AST 19 23 18   ALT 19 42 19  ALKPHOS 124 165* 115  BILITOT 0.6 1.5* 0.6  PROT 5.8* 6.6 4.8*  ALBUMIN 3.2* 3.8 2.6*    Recent Labs  02/14/16 1405 02/16/16 0800 02/19/16 1030  WBC 11.4* 10.9* 10.4  NEUTROABS 10.5* 8.5* 8.1*  HGB 13.4 11.7* 12.3  HCT 39.9 34.8* 36.2  MCV 81.6 82.7 83.6  PLT 302 252 173   Lab Results  Component Value Date   TSH 2.775 01/25/2016   Lab Results  Component Value Date   HGBA1C 6.1 (H) 05/03/2012   Lab Results  Component Value Date   CHOL 186 05/03/2012   HDL 71 05/03/2012   LDLCALC 101 (H) 05/03/2012   TRIG 72 05/03/2012   CHOLHDL 2.6 05/03/2012    Significant Diagnostic Results in last 30 days:  Ct Chest W  Contrast  Result Date: 02/14/2016 CLINICAL DATA:  Abnormal CXR. Pt has been sob but is unable to verbalize time frame. Pt has hx of COPD and irregular heart beat. EXAM: CT CHEST WITH CONTRAST TECHNIQUE: Multidetector CT imaging of the chest was performed during intravenous contrast administration. CONTRAST:  75 cc Isovue 300 COMPARISON:  02/23/2010 FINDINGS: Cardiovascular: The heart is enlarged. Significant coronary artery calcifications are present. Trace pericardial effusion. There is aberrant arch anatomy. Aberrant right subclavian this significant atherosclerotic calcification at its origin. There is a common trunk for both common carotid arteries and a separate left subclavian artery. Extensive atherosclerotic calcification of the thoracic aorta not associated with aneurysm or dissection. Study was not performed for evaluation of the pulmonary arteries. Mediastinum/Nodes: The visualized portion of the thyroid gland has a normal appearance. Small, nonspecific mediastinal lymph nodes. Pericardial fluid is identified in the superior pericardial recesses. The esophagus is normal in appearance. Lungs/Pleura: Fluid attenuation is identified within right lower lobe bronchi. No enhancing mass is identified in this region. Other bronchi appear patent. There is bibasilar atelectasis. Small bilateral pleural effusions are also present. Upper Abdomen: Bilateral renal cysts. Musculoskeletal: Mild degenerative changes are seen in the spine. IMPRESSION: 1. Mucus plugs or aspiration within right lower lobe bronchi. 2. Bilateral pleural effusions and bibasilar atelectasis. 3. Significant coronary artery disease. 4.  Aortic atherosclerosis. 5.  Apparent arch anatomy. 6. Bilateral renal cysts. Electronically Signed   By: Nolon Nations M.D.   On: 02/14/2016 18:37   Dg Chest Port 1 View  Result Date: 02/14/2016 CLINICAL DATA:  Irregular heartbeat, shortness of breath, history COPD, atrial fibrillation, hypertension EXAM:  PORTABLE CHEST 1 VIEW COMPARISON:  Portable exam 1455 hours compared 02/06/2016 FINDINGS: Normal heart size and mediastinal contours. Atherosclerotic calcification aorta. Slight pulmonary vascular congestion. Increased atelectasis versus consolidation in RIGHT lower lobe. Remaining lungs demonstrate chronic changes and question underlying COPD. No acute infiltrate, pleural effusion or pneumothorax. Bones demineralized. IMPRESSION: Probable COPD changes with increased atelectasis versus consolidation in RIGHT lower lobe. Electronically Signed   By: Lavonia Dana M.D.   On: 02/14/2016 15:31   Dg Chest Portable 1 View  Result Date: 02/06/2016 CLINICAL DATA:  81 year old female with increasing cough and pain. Initial encounter. EXAM: PORTABLE CHEST 1 VIEW COMPARISON:  11/08/2014 and earlier. FINDINGS: Portable AP semi upright view at 1951 hours. Chronic pulmonary hyperinflation. Patchy increased bibasilar opacity slightly greater on the right. No pneumothorax or pulmonary edema. No definite pleural effusion. Stable cardiac size and mediastinal contours. Ir calcified aortic atherosclerosis. Visualized tracheal air column is within normal limits. IMPRESSION: 1. chronic hyperinflation with patchy acute bibasilar opacity. Favor acute infectious exacerbation. No definite pleural effusion. 2. Calcified aortic atherosclerosis. Electronically Signed   By: Genevie Ann M.D.   On: 02/06/2016 20:12   Dg Hips Bilat With Pelvis Min 5 Views  Result Date: 02/06/2016 CLINICAL DATA:  Fall at home 01/24/2016 (2 weeks prior), persistent bilateral hip pain since fall. EXAM: DG HIP (WITH OR WITHOUT PELVIS) 5+V BILAT COMPARISON:  Pelvis radiographs 01/24/2016 FINDINGS: The cortical margins of the bony pelvis and both hips are intact. No acute or healing fracture. Pubic symphysis and sacroiliac joints are congruent. Both femoral heads are well-seated in the respective acetabula. Bony under mineralization again seen. Large stool burden.  Atherosclerosis and vascular stents. IMPRESSION: 1. No pelvic fracture. 2. Osteopenia. Electronically Signed   By: Jeb Levering M.D.   On: 02/06/2016 22:54    Assessment/Plan  #1-history of "lump" left low back-this appears to be somewhat brawny-will obtain an x-ray as initial study-and continue to monitor apparently this is been there at least since her fall.  Nursing did speak with her family and apparently the lump may actually proceed a fall-at this point will await x-ray results-previous x-rays do not appear to be concerning.  In regards to pain she does not do well with narcotics she continues on when necessary Biofreeze she also received Flexeril when necessary-lidocaine patch as well.  She also has Tylenol when necessary.  #2-hypokalemia this appears mild but persistent we have increased her potassium to 20 mEq a day and will recheck a BMP next week.  Clinically she appears to be doing well although is very frail.  Her respiratory status appears to be stable which is encouraging-- continues on Xopenex every 4 hours when necessary is also on Spiriva at night  [phone]

## 2016-03-07 ENCOUNTER — Other Ambulatory Visit (HOSPITAL_COMMUNITY)
Admission: AD | Admit: 2016-03-07 | Discharge: 2016-03-07 | Disposition: A | Discharge status: Home / Self Care | Payer: Medicare Other | Source: Skilled Nursing Facility | Attending: Internal Medicine | Admitting: Internal Medicine

## 2016-03-07 ENCOUNTER — Encounter: Payer: Self-pay | Admitting: Internal Medicine

## 2016-03-07 ENCOUNTER — Non-Acute Institutional Stay (SKILLED_NURSING_FACILITY): Payer: Medicare Other | Admitting: Internal Medicine

## 2016-03-07 ENCOUNTER — Other Ambulatory Visit: Payer: Self-pay

## 2016-03-07 DIAGNOSIS — F329 Major depressive disorder, single episode, unspecified: Secondary | ICD-10-CM | POA: Diagnosis not present

## 2016-03-07 DIAGNOSIS — E43 Unspecified severe protein-calorie malnutrition: Secondary | ICD-10-CM | POA: Diagnosis not present

## 2016-03-07 DIAGNOSIS — J438 Other emphysema: Secondary | ICD-10-CM

## 2016-03-07 DIAGNOSIS — F32A Depression, unspecified: Secondary | ICD-10-CM

## 2016-03-07 DIAGNOSIS — I952 Hypotension due to drugs: Secondary | ICD-10-CM | POA: Diagnosis not present

## 2016-03-07 DIAGNOSIS — E875 Hyperkalemia: Secondary | ICD-10-CM

## 2016-03-07 LAB — POTASSIUM: POTASSIUM: 5.4 mmol/L — AB (ref 3.5–5.1)

## 2016-03-07 MED ORDER — HYDROCODONE-ACETAMINOPHEN 5-325 MG PO TABS: 1.0000 | ORAL_TABLET | Freq: Four times a day (QID) | ORAL | 0 refills | Status: DC | PRN

## 2016-03-07 NOTE — Progress Notes (Signed)
Location:   [facility] Room Number: 132/P Place of Service:  SNF (31) Provider:  Mayford Knife., MD  Patient Care Team: Redmond School, MD as PCP - General (Internal Medicine) [name], MD as Consulting Physician (Gastroenterology) Minus Breeding, MD as Consulting Physician (Cardiology)  Extended Emergency Contact Information Primary Emergency Contact: Arnette Schaumann [place] Phone: [phone] Relation: Daughter Secondary Emergency Contact: Knight,Janet  [place] Phone: [phone] Relation: Daughter  Code Status:  Full Code Goals of care: Advanced Directive information Advanced Directives 03/07/2016  Does patient have an advance directive? Yes  Type of Advance Directive (No Data)  Does patient want to make changes to advanced directive? No - Patient declined  Copy of advanced directive(s) in chart? Yes  Would patient like information on creating an advanced directive? -  Pre-existing out of facility DNR order (yellow form or pink MOST form) -     Chief Complaint  Patient presents with  . Acute Visit    HPI:  Pt is a 81 y.o. female seen today for an acute visit for Hyperkalemia and low Bp. Patient was started on potassium supplement as her potassium was running persistently low. Today's lab has potassium is 5.4. While reviewing her medications I also realized her systolic BP is persistency running in 100-90. Patient otherwise is doing good. Had no complains . She had lost her husband in May 2017 and still feels that she still feels depressed about that. Her appetite is good. And she is resting well.  Past Medical History:  Diagnosis Date  . AAA (abdominal aortic aneurysm) without rupture ([place]) 01/24/2016   4.3x4.0 cm per CT   . Atrial fibrillation ([place])   . Cerebrovascular disease   . COPD (chronic obstructive pulmonary disease) ([place])   . CVA 10/25/2008   Qualifier: Diagnosis of   By: [name], CNA, Christy    . DVT (deep venous thrombosis) ([place])   . GERD (gastroesophageal reflux disease)   . Hypertension   . Internal carotid artery stenosis 05/13/2012   HIGH GRADE LEFT INTERNAL CAROTID STENOSIS.  S/p  L CEA 05/11/12  . Irregular heart beat   . PAD (peripheral artery disease) ([facility])    lower extremity PAD with bilateral leg stenting  . TIA (transient ischemic attack)    Past Surgical History:  Procedure Laterality Date  . APPENDECTOMY  1956  . CAROTID ANGIOGRAM N/A 05/06/2012   Procedure: CAROTID ANGIOGRAM;  Surgeon: [name], MD;  Location: [facility] CATH LAB;  Service: Cardiovascular;  Laterality: N/A;  . CAROTID ENDARTERECTOMY Left Nov. 4, 2013   CE  . CHOLECYSTECTOMY  1966   Gall Bladder  . COLONOSCOPY N/A 07/12/2014   [id number] cecal polyp/one colon polyp/redundant left colon  . ENDARTERECTOMY  05/11/2012   Procedure: ENDARTERECTOMY CAROTID;  Surgeon: [name], MD;  Location: [facility] OR;  Service: Vascular;  Laterality: Left;  . ESOPHAGOGASTRODUODENOSCOPY N/A 07/12/2014   [id number] in the lower thrid of the esophagus  . EYE SURGERY Left Dec. 22, 2016   Cataract  . GALLBLADDER SURGERY    . MALONEY DILATION N/A 07/12/2014   Procedure: MALONEY DILATION;  Surgeon: [name], MD;  Location: [facility] ENDO SUITE;  Service: Endoscopy;  Laterality: N/A;  . PARTIAL HYSTERECTOMY    . PATCH ANGIOPLASTY  05/11/2012   Procedure: PATCH ANGIOPLASTY;  Surgeon: [name], MD;  Location: [facility] OR;  Service: Vascular;  Laterality: Left;  with Dacron Patch Angioplasty  .  SAVORY DILATION N/A 07/12/2014   Procedure: SAVORY DILATION;  Surgeon: [name], MD;  Location: [facility] SU[facility];  Service: Endoscopy;  Laterality: N/A;    Allergies  Allergen Reactions  . Ibuprofen     Gi upset  . Pnu-Imune [Pneumococcal Polysaccharide Vaccine]     Huge knot at site and rash  . Advair Diskus [Fluticasone-Salmeterol]     Confusion and shortness of breath  . Albuterol     Confusion  and shortness of breath  . Amoxicillin Hives  . Cephalexin Hives  . Codeine     confusion  . Entex Pac [Pseudoephedrine-Gg & Dm] Other (See Comments)    Upper respiratory.   . Etanercept     vertigo  . Gualenic Acid Other (See Comments)    unknown  . Lorazepam     hallucinations  . Nubain [Nalbuphine Hcl]   . Oxycodone Hcl Nausea And Vomiting  . Oxycodone-Acetaminophen Nausea And Vomiting  . Roxicodone [Oxycodone Hcl]   . Tramadol     hallucinations   . Tylox [Oxycodone-Acetaminophen]       Medication List       Accurate as of 03/07/16  2:03 PM. Always use your most recent med list.          acetaminophen 325 MG tablet Commonly known as:  TYLENOL Take 650 mg by mouth every 4 (four) hours as needed for mild pain or fever.   BIOFREEZE 4 % Gel Generic drug:  Menthol (Topical Analgesic) Apply to both hips for pain Q shift PRN   cilostazol 50 MG tablet Commonly known as:  PLETAL Take 50 mg by mouth 2 (two) times daily.   cyclobenzaprine 5 MG tablet Commonly known as:  FLEXERIL Take 1 tablet (5 mg total) by mouth 3 (three) times daily.   dabigatran 150 MG Caps capsule Commonly known as:  PRADAXA Take 150 mg by mouth 2 (two) times daily.   hydrALAZINE 10 MG tablet Commonly known as:  APRESOLINE Take 1 tablet (10 mg total) by mouth 2 (two) times daily. New medication to help with treatment of your high blood pressure.   levalbuterol 0.63 MG/3ML nebulizer solution Commonly known as:  XOPENEX Take 3 mLs (0.63 mg total) by nebulization every 4 (four) hours as needed for wheezing or shortness of breath.   lidocaine 4 % cream Commonly known as:  LMX Apply 1 application topically daily as needed (for pain (hips/back)).   LIDODERM 5 % Generic drug:  lidocaine Place 1 patch onto the skin daily. Remove & Discard patch within 12 hours or as directed by MD   multivitamin with minerals Tabs tablet Take 1 tablet by mouth daily.   NON FORMULARY Magic cup should come  on meal tray. Give once a day   OXYGEN 3 L/mn as needed every shift   pantoprazole 40 MG tablet Commonly known as:  PROTONIX Take 1 tablet (40 mg total) by mouth daily.   potassium chloride SA 20 MEQ tablet Commonly known as:  K-DUR,KLOR-CON Take 20 mEq by mouth daily.   PREPARATION H HYDROCORTISONE 1 % Generic drug:  hydrocortisone cream Administer until resolved to external hemorrhoid every shift   PROSTAT PO 30 ml by mouth Twice a day   senna-docusate 8.6-50 MG tablet Commonly known as:  Senokot-S Take 2 tablets by mouth at bedtime. For constipation   SPIRIVA HANDIHALER 18 MCG inhalation capsule Generic drug:  tiotropium Place 10 mcg into inhaler and inhale at bedtime   verapamil 240 MG 24 hr  capsule Commonly known as:  VERELAN PM Take 240 mg by mouth daily.   zinc oxide 20 % ointment Apply zinc oxide to buttocks and sacral area every shift and prn       Review of Systems  Constitutional: Negative for appetite change, chills, diaphoresis, fatigue and fever.  Respiratory: Positive for cough. Negative for apnea, choking, chest tightness and shortness of breath.   Cardiovascular: Negative for chest pain, palpitations and leg swelling.  Gastrointestinal: Negative for abdominal distention and abdominal pain.    Immunization History  Administered Date(s) Administered  . Influenza Split 05/03/2012  . Influenza,inj,Quad PF,36+ Mos 04/18/2014  . Influenza-Unspecified 05/08/2013   Pertinent  Health Maintenance Due  Topic Date Due  . DEXA SCAN  11/14/2001  . PNA vac Low Risk Adult (1 of 2 - PCV13) 11/14/2001  . INFLUENZA VACCINE  1Sep 20, 202017 (Originally 02/06/2016)   No flowsheet data found. Functional Status Survey:    There were no vitals filed for this visit. There is no height or weight on file to calculate BMI. Physical Exam  Constitutional: She is oriented to person, place, and time. She is cooperative.  HENT:  Head: Normocephalic.  Neck: Neck supple.    Cardiovascular: Normal rate and regular rhythm.   Pulmonary/Chest: Effort normal and breath sounds normal. She has no wheezes. She has no rales.  Abdominal: Soft. Bowel sounds are normal. She exhibits no distension. There is no tenderness.  Neurological: She is alert and oriented to person, place, and time.    Labs reviewed:  Recent Labs  01/25/16 1711  02/22/16 0725 02/28/16 0715 03/04/16 0612 03/07/16 0705  NA 131*  < > 133* 138 139  --   K 4.8  < > 4.1 3.3* 3.3* 5.4*  CL 98*  < > 97* 101 103  --   CO2 26  < > 30 29 31   --   GLUCOSE 240*  < > 91 89 83  --   BUN 26*  < > 18 16 20   --   CREATININE 0.63  < > 0.52 0.44 0.42*  --   CALCIUM 8.6*  < > 8.0* 8.1* 8.3*  --   MG 1.8  --   --   --   --   --   < > = values in this interval not displayed.  Recent Labs  02/06/16 2020 02/14/16 1405 02/19/16 1030  AST 19 23 18   ALT 19 42 19  ALKPHOS 124 165* 115  BILITOT 0.6 1.5* 0.6  PROT 5.8* 6.6 4.8*  ALBUMIN 3.2* 3.8 2.6*    Recent Labs  02/14/16 1405 02/16/16 0800 02/19/16 1030  WBC 11.4* 10.9* 10.4  NEUTROABS 10.5* 8.5* 8.1*  HGB 13.4 11.7* 12.3  HCT 39.9 34.8* 36.2  MCV 81.6 82.7 83.6  PLT 302 252 173   Lab Results  Component Value Date   TSH 2.775 01/25/2016   Lab Results  Component Value Date   HGBA1C 6.1 (H) 05/03/2012   Lab Results  Component Value Date   CHOL 186 05/03/2012   HDL 71 05/03/2012   LDLCALC 101 (H) 05/03/2012   TRIG 72 05/03/2012   CHOLHDL 2.6 05/03/2012    Significant Diagnostic Results in last 30 days:  Ct Chest W Contrast  Result Date: 02/14/2016 CLINICAL DATA:  Abnormal CXR. Pt has been sob but is unable to verbalize time frame. Pt has hx of COPD and irregular heart beat. EXAM: CT CHEST WITH CONTRAST TECHNIQUE: Multidetector CT imaging of the chest was performed  during intravenous contrast administration. CONTRAST:  75 cc Isovue 300 COMPARISON:  02/23/2010 FINDINGS: Cardiovascular: The heart is enlarged. Significant coronary artery  calcifications are present. Trace pericardial effusion. There is aberrant arch anatomy. Aberrant right subclavian this significant atherosclerotic calcification at its origin. There is a common trunk for both common carotid arteries and a separate left subclavian artery. Extensive atherosclerotic calcification of the thoracic aorta not associated with aneurysm or dissection. Study was not performed for evaluation of the pulmonary arteries. Mediastinum/Nodes: The visualized portion of the thyroid gland has a normal appearance. Small, nonspecific mediastinal lymph nodes. Pericardial fluid is identified in the superior pericardial recesses. The esophagus is normal in appearance. Lungs/Pleura: Fluid attenuation is identified within right lower lobe bronchi. No enhancing mass is identified in this region. Other bronchi appear patent. There is bibasilar atelectasis. Small bilateral pleural effusions are also present. Upper Abdomen: Bilateral renal cysts. Musculoskeletal: Mild degenerative changes are seen in the spine. IMPRESSION: 1. Mucus plugs or aspiration within right lower lobe bronchi. 2. Bilateral pleural effusions and bibasilar atelectasis. 3. Significant coronary artery disease. 4.  Aortic atherosclerosis. 5. Apparent arch anatomy. 6. Bilateral renal cysts. Electronically Signed   By: Nolon Nations M.D.   On: 02/14/2016 18:37   Dg Chest Port 1 View  Result Date: 02/14/2016 CLINICAL DATA:  Irregular heartbeat, shortness of breath, history COPD, atrial fibrillation, hypertension EXAM: PORTABLE CHEST 1 VIEW COMPARISON:  Portable exam 1455 hours compared 02/06/2016 FINDINGS: Normal heart size and mediastinal contours. Atherosclerotic calcification aorta. Slight pulmonary vascular congestion. Increased atelectasis versus consolidation in RIGHT lower lobe. Remaining lungs demonstrate chronic changes and question underlying COPD. No acute infiltrate, pleural effusion or pneumothorax. Bones demineralized.  IMPRESSION: Probable COPD changes with increased atelectasis versus consolidation in RIGHT lower lobe. Electronically Signed   By: Lavonia Dana M.D.   On: 02/14/2016 15:31   Dg Chest Portable 1 View  Result Date: 02/06/2016 CLINICAL DATA:  81 year old female with increasing cough and pain. Initial encounter. EXAM: PORTABLE CHEST 1 VIEW COMPARISON:  11/08/2014 and earlier. FINDINGS: Portable AP semi upright view at 1951 hours. Chronic pulmonary hyperinflation. Patchy increased bibasilar opacity slightly greater on the right. No pneumothorax or pulmonary edema. No definite pleural effusion. Stable cardiac size and mediastinal contours. Ir calcified aortic atherosclerosis. Visualized tracheal air column is within normal limits. IMPRESSION: 1. chronic hyperinflation with patchy acute bibasilar opacity. Favor acute infectious exacerbation. No definite pleural effusion. 2. Calcified aortic atherosclerosis. Electronically Signed   By: Genevie Ann M.D.   On: 02/06/2016 20:12   Dg Hips Bilat With Pelvis Min 5 Views  Result Date: 02/06/2016 CLINICAL DATA:  Fall at home 01/24/2016 (2 weeks prior), persistent bilateral hip pain since fall. EXAM: DG HIP (WITH OR WITHOUT PELVIS) 5+V BILAT COMPARISON:  Pelvis radiographs 01/24/2016 FINDINGS: The cortical margins of the bony pelvis and both hips are intact. No acute or healing fracture. Pubic symphysis and sacroiliac joints are congruent. Both femoral heads are well-seated in the respective acetabula. Bony under mineralization again seen. Large stool burden. Atherosclerosis and vascular stents. IMPRESSION: 1. No pelvic fracture. 2. Osteopenia. Electronically Signed   By: Jeb Levering M.D.   On: 02/06/2016 22:54    Assessment/Plan    Hypotension due to drugs  Will discontinue Hydralazine Follow frequent Vitals.  Hyperkalemia  Discontinue Potassium Repeat BMP  Protein-calorie malnutrition, severe (HCC) Appetite has improved. Patient said her appetite was poor  after she lost her husband in May. But she is doing good now.  Depression Discussed in  details with patient. She says she is just grieving her husband and do not want anything right now for depression. I have told patient she can always let us know if she needs any help.

## 2016-03-07 NOTE — Telephone Encounter (Signed)
RX faxed to [facility] @ [phone]. Phone number [phone]  

## 2016-03-13 ENCOUNTER — Other Ambulatory Visit (HOSPITAL_COMMUNITY)
Admission: RE | Admit: 2016-03-13 | Discharge: 2016-03-13 | Disposition: A | Discharge status: Home / Self Care | Payer: Medicare Other | Source: Skilled Nursing Facility | Attending: Internal Medicine | Admitting: Internal Medicine

## 2016-03-13 DIAGNOSIS — D649 Anemia, unspecified: Secondary | ICD-10-CM | POA: Insufficient documentation

## 2016-03-14 ENCOUNTER — Non-Acute Institutional Stay (SKILLED_NURSING_FACILITY): Payer: Medicare Other | Admitting: Internal Medicine

## 2016-03-14 ENCOUNTER — Encounter: Payer: Self-pay | Admitting: Internal Medicine

## 2016-03-14 ENCOUNTER — Encounter (HOSPITAL_COMMUNITY)
Admission: RE | Admit: 2016-03-14 | Discharge: 2016-03-14 | Disposition: A | Discharge status: Home / Self Care | Payer: Medicare Other | Source: Skilled Nursing Facility | Attending: Internal Medicine | Admitting: Internal Medicine

## 2016-03-14 DIAGNOSIS — R195 Other fecal abnormalities: Secondary | ICD-10-CM | POA: Diagnosis not present

## 2016-03-14 DIAGNOSIS — K640 First degree hemorrhoids: Secondary | ICD-10-CM

## 2016-03-14 DIAGNOSIS — I482 Chronic atrial fibrillation, unspecified: Secondary | ICD-10-CM

## 2016-03-14 LAB — BASIC METABOLIC PANEL
Anion gap: 6 (ref 5–15)
BUN: 23 mg/dL — AB (ref 6–20)
CHLORIDE: 102 mmol/L (ref 101–111)
CO2: 33 mmol/L — AB (ref 22–32)
CREATININE: 0.53 mg/dL (ref 0.44–1.00)
Calcium: 8.9 mg/dL (ref 8.9–10.3)
GFR calc Af Amer: >60 mL/min (ref >60)
GFR calc non Af Amer: >60 mL/min (ref >60)
GLUCOSE: 90 mg/dL (ref 65–99)
Potassium: 4.1 mmol/L (ref 3.5–5.1)
Sodium: 141 mmol/L (ref 135–145)

## 2016-03-14 NOTE — Progress Notes (Signed)
Location:   [facility] Room Number: 132/P Place of Service:  SNF (31) Provider:  Mayford Knife., MD  Patient Care Team: Redmond School, MD as PCP - General (Internal Medicine) [name], MD as Consulting Physician (Gastroenterology) Minus Breeding, MD as Consulting Physician (Cardiology)  Extended Emergency Contact Information Primary Emergency Contact: Arnette Schaumann [place] Phone: [phone] Relation: Daughter Secondary Emergency Contact: Knight,Janet  [place] Phone: [phone] Relation: Daughter  Code Status: Full Code  Goals of care: Advanced Directive information Advanced Directives 03/14/2016  Does patient have an advance directive? Yes  Type of Advance Directive (No Data)  Does patient want to make changes to advanced directive? No - Patient declined  Copy of advanced directive(s) in chart? Yes  Would patient like information on creating an advanced directive? -  Pre-existing out of facility DNR order (yellow form or pink MOST form) -     Chief Complaint  Patient presents with  . Acute Visit    Positive stool    HPI:  Pt is a 81 y.o. female seen today for an acute visit for Occult Card positive. Patient had positive stool card. She says she has noticed her Hemorrhoids flaring up. She has not have any change in bowel habits. Has not noticed any blood in stools.No abdominal pain. Her appetite is good.   Past Medical History:  Diagnosis Date  . AAA (abdominal aortic aneurysm) without rupture ([place]) 01/24/2016   4.3x4.0 cm per CT   . Atrial fibrillation ([place])   . Cerebrovascular disease   . COPD (chronic obstructive pulmonary disease) ([place])   . CVA 10/25/2008   Qualifier: Diagnosis of  By: [name], CNA, Christy    . DVT (deep venous thrombosis) ([place])   . GERD (gastroesophageal reflux disease)   . Hypertension   . Internal carotid artery stenosis 05/13/2012   HIGH GRADE  LEFT INTERNAL CAROTID STENOSIS.  S/p  L CEA 05/11/12  . Irregular heart beat   . PAD (peripheral artery disease) ([facility])    lower extremity PAD with bilateral leg stenting  . TIA (transient ischemic attack)    Past Surgical History:  Procedure Laterality Date  . APPENDECTOMY  1956  . CAROTID ANGIOGRAM N/A 05/06/2012   Procedure: CAROTID ANGIOGRAM;  Surgeon: [name], MD;  Location: [facility] CATH LAB;  Service: Cardiovascular;  Laterality: N/A;  . CAROTID ENDARTERECTOMY Left Nov. 4, 2013   CE  . CHOLECYSTECTOMY  1966   Gall Bladder  . COLONOSCOPY N/A 07/12/2014   [id number] cecal polyp/one colon polyp/redundant left colon  . ENDARTERECTOMY  05/11/2012   Procedure: ENDARTERECTOMY CAROTID;  Surgeon: [name], MD;  Location: [facility] OR;  Service: Vascular;  Laterality: Left;  . ESOPHAGOGASTRODUODENOSCOPY N/A 07/12/2014   [id number] in the lower thrid of the esophagus  . EYE SURGERY Left Dec. 22, 2016   Cataract  . GALLBLADDER SURGERY    . MALONEY DILATION N/A 07/12/2014   Procedure: MALONEY DILATION;  Surgeon: [name], MD;  Location: [facility] SUITE;  Service: Endoscopy;  Laterality: N/A;  . PARTIAL HYSTERECTOMY    . PATCH ANGIOPLASTY  05/11/2012   Procedure: PATCH ANGIOPLASTY;  Surgeon: [name], MD;  Location: [facility] OR;  Service: Vascular;  Laterality: Left;  with Dacron Patch Angioplasty  . SAVORY DILATION N/A 07/12/2014   Procedure: SAVORY DILATION;  Surgeon: [name], MD;  Location: [facility] SUITE;  Service: Endoscopy;  Laterality: N/A;  Allergies  Allergen Reactions  . Ibuprofen     Gi upset  . Pnu-Imune [Pneumococcal Polysaccharide Vaccine]     Huge knot at site and rash  . Advair Diskus [Fluticasone-Salmeterol]     Confusion and shortness of breath  . Albuterol     Confusion and shortness of breath  . Amoxicillin Hives  . Cephalexin Hives  . Codeine     confusion  . Entex Pac [Pseudoephedrine-Gg & Dm] Other (See Comments)    Upper respiratory.   .  Etanercept     vertigo  . Gualenic Acid Other (See Comments)    unknown  . Lorazepam     hallucinations  . Nubain [Nalbuphine Hcl]   . Oxycodone Hcl Nausea And Vomiting  . Oxycodone-Acetaminophen Nausea And Vomiting  . Roxicodone [Oxycodone Hcl]   . Tramadol     hallucinations   . Tylox [Oxycodone-Acetaminophen]       Medication List       Accurate as of 03/14/16 11:54 AM. Always use your most recent med list.          acetaminophen 325 MG tablet Commonly known as:  TYLENOL Take 650 mg by mouth every 4 (four) hours as needed for mild pain or fever.   BIOFREEZE 4 % Gel Generic drug:  Menthol (Topical Analgesic) Apply to both hips for pain Q shift PRN   cilostazol 50 MG tablet Commonly known as:  PLETAL Take 50 mg by mouth 2 (two) times daily.   cyclobenzaprine 5 MG tablet Commonly known as:  FLEXERIL Take 1 tablet (5 mg total) by mouth 3 (three) times daily.PRN   dabigatran 150 MG Caps capsule Commonly known as:  PRADAXA Take 150 mg by mouth 2 (two) times daily.   levalbuterol 0.63 MG/3ML nebulizer solution Commonly known as:  XOPENEX Take 3 mLs (0.63 mg total) by nebulization every 4 (four) hours as needed for wheezing or shortness of breath.   lidocaine 4 % cream Commonly known as:  LMX Apply 1 application topically daily as needed (for pain (hips/back)).   LIDODERM 5 % Generic drug:  lidocaine Place 1 patch onto the skin daily. Remove & Discard patch within 12 hours or as directed by MD   multivitamin with minerals Tabs tablet Take 1 tablet by mouth daily.   NON FORMULARY Magic cup should come on meal tray. Give once a day   OXYGEN 3 L/mn as needed every shift   pantoprazole 40 MG tablet Commonly known as:  PROTONIX Take 1 tablet (40 mg total) by mouth daily.   PREPARATION H HYDROCORTISONE 1 % Generic drug:  hydrocortisone cream Administer until resolved to external hemorrhoid every shift   PROSTAT PO 30 ml by mouth Twice a day     senna-docusate 8.6-50 MG tablet Commonly known as:  Senokot-S Take 2 tablets by mouth at bedtime. For constipation   SPIRIVA HANDIHALER 18 MCG inhalation capsule Generic drug:  tiotropium Place 10 mcg into inhaler and inhale at bedtime   verapamil 240 MG 24 hr capsule Commonly known as:  VERELAN PM Take 240 mg by mouth daily.   zinc oxide 20 % ointment Apply zinc oxide to buttocks and sacral area every shift and prn       Review of Systems  Constitutional: Negative for activity change, appetite change, fatigue, fever and unexpected weight change.  Respiratory: Negative.   Cardiovascular: Negative.   Gastrointestinal: Negative for abdominal distention, abdominal pain, anal bleeding, blood in stool, constipation, diarrhea and nausea.  No flowsheet data found. Functional Status Survey:    Vitals:   03/14/16 1040  BP: 112/75  Pulse: 89  Resp: 20  Temp: 97.5 F (36.4 C)  TempSrc: Oral   There is no height or weight on file to calculate BMI. Physical Exam  Constitutional: She appears well-developed.  HENT:  Head: Normocephalic.  Mouth/Throat: Oropharynx is clear and moist.  Cardiovascular: Normal rate, regular rhythm and normal heart sounds.   Pulmonary/Chest: Effort normal and breath sounds normal. She has no wheezes. She has no rales.  Abdominal: Soft. She exhibits no distension. There is no tenderness.  Rectal exam. Patient has a swollen external Hemorrhoid 9 O clock position.    Labs reviewed:  Recent Labs  01/25/16 1711  02/28/16 0715 03/04/16 0612 03/07/16 0705 03/14/16 0750  NA 131*  < > 138 139  --  141  K 4.8  < > 3.3* 3.3* 5.4* 4.1  CL 98*  < > 101 103  --  102  CO2 26  < > 29 31  --  33*  GLUCOSE 240*  < > 89 83  --  90  BUN 26*  < > 16 20  --  23*  CREATININE 0.63  < > 0.44 0.42*  --  0.53  CALCIUM 8.6*  < > 8.1* 8.3*  --  8.9  MG 1.8  --   --   --   --   --   < > = values in this interval not displayed.  Recent Labs  02/06/16 2020  02/14/16 1405 02/19/16 1030  AST 19 23 18   ALT 19 42 19  ALKPHOS 124 165* 115  BILITOT 0.6 1.5* 0.6  PROT 5.8* 6.6 4.8*  ALBUMIN 3.2* 3.8 2.6*    Recent Labs  02/14/16 1405 02/16/16 0800 02/19/16 1030  WBC 11.4* 10.9* 10.4  NEUTROABS 10.5* 8.5* 8.1*  HGB 13.4 11.7* 12.3  HCT 39.9 34.8* 36.2  MCV 81.6 82.7 83.6  PLT 302 252 173   Lab Results  Component Value Date   TSH 2.775 01/25/2016   Lab Results  Component Value Date   HGBA1C 6.1 (H) 05/03/2012   Lab Results  Component Value Date   CHOL 186 05/03/2012   HDL 71 05/03/2012   LDLCALC 101 (H) 05/03/2012   TRIG 72 05/03/2012   CHOLHDL 2.6 05/03/2012    Significant Diagnostic Results in last 30 days:  Ct Chest W Contrast  Result Date: 02/14/2016 CLINICAL DATA:  Abnormal CXR. Pt has been sob but is unable to verbalize time frame. Pt has hx of COPD and irregular heart beat. EXAM: CT CHEST WITH CONTRAST TECHNIQUE: Multidetector CT imaging of the chest was performed during intravenous contrast administration. CONTRAST:  75 cc Isovue 300 COMPARISON:  02/23/2010 FINDINGS: Cardiovascular: The heart is enlarged. Significant coronary artery calcifications are present. Trace pericardial effusion. There is aberrant arch anatomy. Aberrant right subclavian this significant atherosclerotic calcification at its origin. There is a common trunk for both common carotid arteries and a separate left subclavian artery. Extensive atherosclerotic calcification of the thoracic aorta not associated with aneurysm or dissection. Study was not performed for evaluation of the pulmonary arteries. Mediastinum/Nodes: The visualized portion of the thyroid gland has a normal appearance. Small, nonspecific mediastinal lymph nodes. Pericardial fluid is identified in the superior pericardial recesses. The esophagus is normal in appearance. Lungs/Pleura: Fluid attenuation is identified within right lower lobe bronchi. No enhancing mass is identified in this region.  Other bronchi appear patent. There is bibasilar  atelectasis. Small bilateral pleural effusions are also present. Upper Abdomen: Bilateral renal cysts. Musculoskeletal: Mild degenerative changes are seen in the spine. IMPRESSION: 1. Mucus plugs or aspiration within right lower lobe bronchi. 2. Bilateral pleural effusions and bibasilar atelectasis. 3. Significant coronary artery disease. 4.  Aortic atherosclerosis. 5. Apparent arch anatomy. 6. Bilateral renal cysts. Electronically Signed   By: Nolon Nations M.D.   On: 02/14/2016 18:37   Dg Chest Port 1 View  Result Date: 02/14/2016 CLINICAL DATA:  Irregular heartbeat, shortness of breath, history COPD, atrial fibrillation, hypertension EXAM: PORTABLE CHEST 1 VIEW COMPARISON:  Portable exam 1455 hours compared 02/06/2016 FINDINGS: Normal heart size and mediastinal contours. Atherosclerotic calcification aorta. Slight pulmonary vascular congestion. Increased atelectasis versus consolidation in RIGHT lower lobe. Remaining lungs demonstrate chronic changes and question underlying COPD. No acute infiltrate, pleural effusion or pneumothorax. Bones demineralized. IMPRESSION: Probable COPD changes with increased atelectasis versus consolidation in RIGHT lower lobe. Electronically Signed   By: Lavonia Dana M.D.   On: 02/14/2016 15:31    Assessment/Plan  Positive Occult card  Patient Hgb has been stable and normal. She had colonoscopy and EGD done in 2016 in [facility]. It showed gastritis and cecal polyp.  Will continue Pradaxa for atrial fibrillation Check HGB in few days. Continue Preparation H Also Continue on Protonix. Follow up with GI in [place].  Chronic Atrial Fibrillation   Rate control Continue Pradaxa.   Family/ staff Communication:   Labs/tests ordered:

## 2016-03-15 ENCOUNTER — Encounter (HOSPITAL_COMMUNITY)
Admission: RE | Admit: 2016-03-15 | Discharge: 2016-03-15 | Disposition: A | Discharge status: Home / Self Care | Payer: Medicare Other | Source: Skilled Nursing Facility | Attending: *Deleted | Admitting: *Deleted

## 2016-03-15 LAB — CBC WITH DIFFERENTIAL/PLATELET
Basophils Absolute: 0.1 10*3/uL (ref 0.0–0.1)
Basophils Relative: 1 %
EOS PCT: 1 %
Eosinophils Absolute: 0.1 10*3/uL (ref 0.0–0.7)
HCT: 34.8 % — ABNORMAL LOW (ref 36.0–46.0)
HEMOGLOBIN: 11 g/dL — AB (ref 12.0–15.0)
LYMPHS ABS: 2.1 10*3/uL (ref 0.7–4.0)
LYMPHS PCT: 23 %
MCH: 27.1 pg (ref 26.0–34.0)
MCHC: 31.6 g/dL (ref 30.0–36.0)
MCV: 85.7 fL (ref 78.0–100.0)
Monocytes Absolute: 0.9 10*3/uL (ref 0.1–1.0)
Monocytes Relative: 10 %
NEUTROS PCT: 65 %
Neutro Abs: 5.8 10*3/uL (ref 1.7–7.7)
PLATELETS: 264 10*3/uL (ref 150–400)
RBC: 4.06 MIL/uL (ref 3.87–5.11)
RDW: 17.5 % — AB (ref 11.5–15.5)
WBC: 8.9 10*3/uL (ref 4.0–10.5)

## 2016-04-01 ENCOUNTER — Non-Acute Institutional Stay (SKILLED_NURSING_FACILITY): Payer: Medicare Other | Admitting: Internal Medicine

## 2016-04-01 ENCOUNTER — Encounter: Payer: Self-pay | Admitting: Internal Medicine

## 2016-04-01 DIAGNOSIS — G8929 Other chronic pain: Secondary | ICD-10-CM

## 2016-04-01 DIAGNOSIS — H34211 Partial retinal artery occlusion, right eye: Secondary | ICD-10-CM | POA: Diagnosis not present

## 2016-04-01 DIAGNOSIS — J9611 Chronic respiratory failure with hypoxia: Secondary | ICD-10-CM

## 2016-04-01 DIAGNOSIS — H04123 Dry eye syndrome of bilateral lacrimal glands: Secondary | ICD-10-CM | POA: Diagnosis not present

## 2016-04-01 DIAGNOSIS — M545 Low back pain, unspecified: Secondary | ICD-10-CM

## 2016-04-01 DIAGNOSIS — D3132 Benign neoplasm of left choroid: Secondary | ICD-10-CM | POA: Diagnosis not present

## 2016-04-01 DIAGNOSIS — E875 Hyperkalemia: Secondary | ICD-10-CM

## 2016-04-01 DIAGNOSIS — I482 Chronic atrial fibrillation, unspecified: Secondary | ICD-10-CM

## 2016-04-01 DIAGNOSIS — H2513 Age-related nuclear cataract, bilateral: Secondary | ICD-10-CM | POA: Diagnosis not present

## 2016-04-01 DIAGNOSIS — H353112 Nonexudative age-related macular degeneration, right eye, intermediate dry stage: Secondary | ICD-10-CM | POA: Diagnosis not present

## 2016-04-01 DIAGNOSIS — H2511 Age-related nuclear cataract, right eye: Secondary | ICD-10-CM | POA: Diagnosis not present

## 2016-04-01 DIAGNOSIS — D2211 Melanocytic nevi of right eyelid, including canthus: Secondary | ICD-10-CM | POA: Diagnosis not present

## 2016-04-01 DIAGNOSIS — Z961 Presence of intraocular lens: Secondary | ICD-10-CM | POA: Diagnosis not present

## 2016-04-01 NOTE — Progress Notes (Signed)
Location:   [facility] Room Number: 132/P Place of Service:  SNF (31)  Provider: Granville Lewis  PCP: Glo Herring., MD Patient Care Team: Redmond School, MD as PCP - General (Internal Medicine) [name], MD as Consulting Physician (Gastroenterology) Minus Breeding, MD as Consulting Physician (Cardiology)  Extended Emergency Contact Information Primary Emergency Contact: Arnette Schaumann [place] Phone: [phone] Relation: Daughter Secondary Emergency Contact: Knight,Janet  [place] Phone: [phone] Relation: Daughter  Code Status: Full Code Goals of care:  Advanced Directive information Advanced Directives 04/01/2016  Does patient have an advance directive? Yes  Type of Advance Directive (No Data)  Does patient want to make changes to advanced directive? No - Patient declined  Copy of advanced directive(s) in chart? Yes  Would patient like information on creating an advanced directive? -  Pre-existing out of facility DNR order (yellow form or pink MOST form) -     Allergies  Allergen Reactions  . Ibuprofen     Gi upset  . Pnu-Imune [Pneumococcal Polysaccharide Vaccine]     Huge knot at site and rash  . Advair Diskus [Fluticasone-Salmeterol]     Confusion and shortness of breath  . Albuterol     Confusion and shortness of breath  . Amoxicillin Hives  . Cephalexin Hives  . Codeine     confusion  . Entex Pac [Pseudoephedrine-Gg & Dm] Other (See Comments)    Upper respiratory.   . Etanercept     vertigo  . Gualenic Acid Other (See Comments)    unknown  . Lorazepam     hallucinations  . Nubain [Nalbuphine Hcl]   . Oxycodone Hcl Nausea And Vomiting  . Oxycodone-Acetaminophen Nausea And Vomiting  . Roxicodone [Oxycodone Hcl]   . Tramadol     hallucinations   . Tylox [Oxycodone-Acetaminophen]     Chief Complaint  Patient presents with  . Discharge Note    HPI:  81 y.o. female   seen today for discharge from facility.  Patient has gained strength and done relatively well-she is now walking at times with physical therapy with a rolling walker which is encouraging.  Initially she had a complicated course here initially was admitted to the hospital after a fall with no acute pathology found-she was sent home with therapy however developed respiratory distress and was readmitted to the hospital and subsequently here for deconditioning and strengthening.  She appears to have done relatively well here but continues to be oxygen dependent she says her breathing has improved as well as exercise tolerance.  Initially she appeared to have some depression and poor appetite but apparently this has improved she did not 1 anything for depression thinking it was more of a situational type situation.  No gross of COPD she continues on inhalers oxygen Spiriva Xopenex nebulizers.  She also has a history of atrial fibrillation this is rate controlled on verapamil she is on Prozac so for anticoagulation.  Initially when she came here she has some mild hypokalemia this was supplemented and actually potassium went little high in potassium was discontinued this will need follow-up by primary care provider potassium was 4.1 on lab done on September 7.  She also had an occult positive blood tests although was thought this was secondary to a hemorrhoid which was treated.  Hemoglobin remained stable.  Most recent hemoglobin on 03/15/2016 was 11.0.  Currently she has no complaints she is sitting in her wheelchair comfortably is looking forward to  going home.  She will need PT and OT she has all the DME she needs at home including oxygen-family will be staying with her        Past Medical History:  Diagnosis Date  . AAA (abdominal aortic aneurysm) without rupture ([place]) 01/24/2016   4.3x4.0 cm per CT   . Atrial fibrillation ([place])   . Cerebrovascular disease   . COPD (chronic  obstructive pulmonary disease) ([place])   . CVA 10/25/2008   Qualifier: Diagnosis of  By: [name], CNA, Christy    . DVT (deep venous thrombosis) ([place])   . GERD (gastroesophageal reflux disease)   . Hypertension   . Internal carotid artery stenosis 05/13/2012   HIGH GRADE LEFT INTERNAL CAROTID STENOSIS.  S/p  L CEA 05/11/12  . Irregular heart beat   . PAD (peripheral artery disease) ([facility])    lower extremity PAD with bilateral leg stenting  . TIA (transient ischemic attack)     Past Surgical History:  Procedure Laterality Date  . APPENDECTOMY  1956  . CAROTID ANGIOGRAM N/A 05/06/2012   Procedure: CAROTID ANGIOGRAM;  Surgeon: [name], MD;  Location: [facility] CATH LAB;  Service: Cardiovascular;  Laterality: N/A;  . CAROTID ENDARTERECTOMY Left Nov. 4, 2013   CE  . CHOLECYSTECTOMY  1966   Gall Bladder  . COLONOSCOPY N/A 07/12/2014   [id number] cecal polyp/one colon polyp/redundant left colon  . ENDARTERECTOMY  05/11/2012   Procedure: ENDARTERECTOMY CAROTID;  Surgeon: [name], MD;  Location: [facility] OR;  Service: Vascular;  Laterality: Left;  . ESOPHAGOGASTRODUODENOSCOPY N/A 07/12/2014   [id number] in the lower thrid of the esophagus  . EYE SURGERY Left Dec. 22, 2016   Cataract  . GALLBLADDER SURGERY    . MALONEY DILATION N/A 07/12/2014   Procedure: MALONEY DILATION;  Surgeon: [name], MD;  Location: [facility] ENDO SUITE;  Service: Endoscopy;  Laterality: N/A;  . PARTIAL HYSTERECTOMY    . PATCH ANGIOPLASTY  05/11/2012   Procedure: PATCH ANGIOPLASTY;  Surgeon: [name], MD;  Location: [facility] OR;  Service: Vascular;  Laterality: Left;  with Dacron Patch Angioplasty  . SAVORY DILATION N/A 07/12/2014   Procedure: SAVORY DILATION;  Surgeon: [name], MD;  Location: [facility] SUITE;  Service: Endoscopy;  Laterality: N/A;      reports that she quit smoking about 2 years ago. She smoked 1.00 pack per day. She has quit using smokeless tobacco. She reports that she does not drink alcohol or  use drugs. Social History   Social History  . Marital status: Married    Spouse name: N/A  . Number of children: N/A  . Years of education: N/A   Occupational History  . unemployed Retired   Social History Main Topics  . Smoking status: Former Smoker    Packs/day: 1.00    Quit date: 02/23/2014  . Smokeless tobacco: Former Systems developer  . Alcohol use No  . Drug use: No  . Sexual activity: No   Other Topics Concern  . Not on file   Social History Narrative  . No narrative on file   Functional Status Survey:    Allergies  Allergen Reactions  . Ibuprofen     Gi upset  . Pnu-Imune [Pneumococcal Polysaccharide Vaccine]     Huge knot at site and rash  . Advair Diskus [Fluticasone-Salmeterol]     Confusion and shortness of breath  . Albuterol     Confusion and shortness of breath  . Amoxicillin Hives  .  Cephalexin Hives  . Codeine     confusion  . Entex Pac [Pseudoephedrine-Gg & Dm] Other (See Comments)    Upper respiratory.   . Etanercept     vertigo  . Gualenic Acid Other (See Comments)    unknown  . Lorazepam     hallucinations  . Nubain [Nalbuphine Hcl]   . Oxycodone Hcl Nausea And Vomiting  . Oxycodone-Acetaminophen Nausea And Vomiting  . Roxicodone [Oxycodone Hcl]   . Tramadol     hallucinations   . Tylox [Oxycodone-Acetaminophen]     Pertinent  Health Maintenance Due  Topic Date Due  . DEXA SCAN  11/14/2001  . PNA vac Low Risk Adult (1 of 2 - PCV13) 11/14/2001  . INFLUENZA VACCINE  104/06/202017 (Originally 02/06/2016)    Medications:   Medication List       Accurate as of 04/01/16 11:44 AM. Always use your most recent med list.          acetaminophen 325 MG tablet Commonly known as:  TYLENOL Take 650 mg by mouth every 4 (four) hours as needed for mild pain or fever.   BIOFREEZE 4 % Gel Generic drug:  Menthol (Topical Analgesic) Apply to both hips for pain Q shift PRN   cilostazol 50 MG tablet Commonly known as:  PLETAL Take 50 mg by mouth 2  (two) times daily.   cyclobenzaprine 5 MG tablet Commonly known as:  FLEXERIL Take 1 tablet (5 mg total) by mouth 3 (three) times daily.   dabigatran 150 MG Caps capsule Commonly known as:  PRADAXA Take 150 mg by mouth 2 (two) times daily.   levalbuterol 0.63 MG/3ML nebulizer solution Commonly known as:  XOPENEX Take 3 mLs (0.63 mg total) by nebulization every 4 (four) hours as needed for wheezing or shortness of breath.   lidocaine 4 % cream Commonly known as:  LMX Apply 1 application topically daily as needed (for pain (hips/back)).   LIDODERM 5 % Generic drug:  lidocaine Place 1 patch onto the skin daily. Remove & Discard patch within 12 hours or as directed by MD   multivitamin with minerals Tabs tablet Take 1 tablet by mouth daily.   NON FORMULARY Mighty shake should come on meal tray. Give once a day   OXYGEN 3 L/mn as needed every shift   pantoprazole 40 MG tablet Commonly known as:  PROTONIX Take 1 tablet (40 mg total) by mouth daily.   PREPARATION H HYDROCORTISONE 1 % Generic drug:  hydrocortisone cream Administer until resolved to external hemorrhoid every shift   PROSTAT PO 30 ml by mouth Twice a day   senna-docusate 8.6-50 MG tablet Commonly known as:  Senokot-S Take 2 tablets by mouth at bedtime. For constipation   SPIRIVA HANDIHALER 18 MCG inhalation capsule Generic drug:  tiotropium Place 10 mcg into inhaler and inhale at bedtime   verapamil 240 MG 24 hr capsule Commonly known as:  VERELAN PM Take 240 mg by mouth daily.   zinc oxide 20 % ointment Apply zinc oxide to buttocks and sacral area every shift and prn       Review of Systems   General no complaints of fever or chills.  Skin does not complain of rashes or itching.  Head ears eyes nose mouth and throat does not complain of sore throat or visual changes.  Respiratory does not complain of increased shortness of breath beyond baseline or cough that has increased appears to have  stabilized in this regards.  Heart attack is  not complaining of chest pain does not have significant lower extremity edema.  GI is not complaining of abdominal discomfort nausea vomiting diarrhea constipation says her appetite is fine  GU is not complaining of dysuria.  Musculoskeletal continues to have some weakness but is ambulating better using a rolling walker does not complain of joint pain.  Neurologic is not complaining of dizziness headache or syncopal-type feelings.  Psych is not complaining of depression or anxiety is looking forward to going home There were no vitals filed for this visit.  Physical Exam   Temperature 98.4 pulse 84 respirations 20 blood pressure 102/69.  Constitutional: She appears significant stronger than when I first assessed her-but continues to be frail.  Her skin is warm and dry  HENT: Visual acuity appears grossly intact Head: Normocephalic.  Mouth/Throat: Oropharynx is clear and moist.  Cardiovascular: Normal rate, regular rhythm and normal heart sounds.   no significant lower extremity edema Pulmonary/Chest: Effort normal and breath sounds normal. She has no wheezes. She has no rales.  Abdominal: Soft. She exhibits no distension. There is no tenderness all sounds are positive Muscle skeletal is able to move all extremities 4 has general frailty is ambulatory with a rolling walker for limited distance about 100 feet.  Neurologic is grossly intact her speech is clear.  Psych she is alert and oriented pleasant and appropriate  .  Rectal exam. Patient has a swollen external Hemorrhoid 9 O clock position.    Labs reviewed: Basic Metabolic Panel:  Recent Labs  01/25/16 1711  02/28/16 0715 03/04/16 0612 03/07/16 0705 03/14/16 0750  NA 131*  < > 138 139  --  141  K 4.8  < > 3.3* 3.3* 5.4* 4.1  CL 98*  < > 101 103  --  102  CO2 26  < > 29 31  --  33*  GLUCOSE 240*  < > 89 83  --  90  BUN 26*  < > 16 20  --  23*  CREATININE 0.63  < >  0.44 0.42*  --  0.53  CALCIUM 8.6*  < > 8.1* 8.3*  --  8.9  MG 1.8  --   --   --   --   --   < > = values in this interval not displayed. Liver Function Tests:  Recent Labs  02/06/16 2020 02/14/16 1405 02/19/16 1030  AST 19 23 18   ALT 19 42 19  ALKPHOS 124 165* 115  BILITOT 0.6 1.5* 0.6  PROT 5.8* 6.6 4.8*  ALBUMIN 3.2* 3.8 2.6*   No results for input(s): LIPASE, AMYLASE in the last 8760 hours. No results for input(s): AMMONIA in the last 8760 hours. CBC:  Recent Labs  02/16/16 0800 02/19/16 1030 03/15/16 0810  WBC 10.9* 10.4 8.9  NEUTROABS 8.5* 8.1* 5.8  HGB 11.7* 12.3 11.0*  HCT 34.8* 36.2 34.8*  MCV 82.7 83.6 85.7  PLT 252 173 264   Cardiac Enzymes:  Recent Labs  02/14/16 1405  TROPONINI <0.03   BNP: Invalid input(s): POCBNP CBG: No results for input(s): GLUCAP in the last 8760 hours.  Procedures and Imaging Studies During Stay: No results found.  Assessment/Plan:    #1-history of respiratory failure with COPD-this is stabilized is on chronic oxygen as well as Xopenex nebulizers and Spiriva she appears to be stable in this regards but still a fragile individual.  #2 history of back pain this appears to be controlled currently she has gained strength is ambulating with a walker  at times she does not do well with narcotics does have a Lidoderm patch for her left hip and lidocaine topical once a day as needed she also has an order for Biofreeze when necessary.  #3 history of atrial fibrillation this appears rate controlled she is on verapamil continues on Topamax for anticoagulation.  I do note occasionally I see some lower systolic readings but these are not frequent and I suspect may be a machine variation at times-I got 102/74 her blood pressure today which appears relatively baseline I do see systolics as high as the [id number] at times.  She does not report any syncope at this point will defer to primary care provider since she has been stable in this  regards.  #4 history of potassium abnormalities one point she been somewhat low and became slightly high she is no longer on potassium level checkup BMP before discharge.  #5 history of occult positive stool again she did have hemorrhoids-has been suggestion for GI follow-we'll defer to primary care provider for follow-up of this her hemoglobin has been stable.  -She is on a proton pump inhibitor.  [id number] note greater than 30 minutes spent on this discharge summary-greater than 50% of time spent coordinating plan of care for numerous diagnoses   Patient is being discharged with the following home health services:  She has on DME required at home She will need PT and OT for strengthening with her history of debility respiratory failure and back pain  :    Patient has been advised to f/u with their PCP in 1-2 weeks to bring them up to date on their rehab stay.  Social services at facility was responsible for arranging this appointment.  Pt was provided with a 30 day supply of prescriptions for medications and refills must be obtained from their PCP.  For controlled substances, a more limited supply may be provided adequate until PCP appointment only.

## 2016-04-02 ENCOUNTER — Encounter (HOSPITAL_COMMUNITY)
Admission: RE | Admit: 2016-04-02 | Discharge: 2016-04-02 | Disposition: A | Discharge status: Home / Self Care | Payer: Medicare Other | Source: Skilled Nursing Facility | Attending: *Deleted | Admitting: *Deleted

## 2016-04-02 DIAGNOSIS — I482 Chronic atrial fibrillation: Secondary | ICD-10-CM | POA: Diagnosis not present

## 2016-04-02 DIAGNOSIS — M6281 Muscle weakness (generalized): Secondary | ICD-10-CM | POA: Diagnosis not present

## 2016-04-02 DIAGNOSIS — J9611 Chronic respiratory failure with hypoxia: Secondary | ICD-10-CM | POA: Diagnosis not present

## 2016-04-02 DIAGNOSIS — R260 Ataxic gait: Secondary | ICD-10-CM | POA: Diagnosis not present

## 2016-04-02 DIAGNOSIS — J449 Chronic obstructive pulmonary disease, unspecified: Secondary | ICD-10-CM | POA: Diagnosis not present

## 2016-04-02 DIAGNOSIS — I2510 Atherosclerotic heart disease of native coronary artery without angina pectoris: Secondary | ICD-10-CM | POA: Diagnosis not present

## 2016-04-02 LAB — BASIC METABOLIC PANEL
ANION GAP: 6 (ref 5–15)
BUN: 19 mg/dL (ref 6–20)
CALCIUM: 8.7 mg/dL — AB (ref 8.9–10.3)
CO2: 35 mmol/L — ABNORMAL HIGH (ref 22–32)
Chloride: 100 mmol/L — ABNORMAL LOW (ref 101–111)
Creatinine, Ser: 0.55 mg/dL (ref 0.44–1.00)
GFR calc Af Amer: >60 mL/min (ref >60)
GLUCOSE: 88 mg/dL (ref 65–99)
POTASSIUM: 3.6 mmol/L (ref 3.5–5.1)
SODIUM: 141 mmol/L (ref 135–145)

## 2016-04-04 DIAGNOSIS — D692 Other nonthrombocytopenic purpura: Secondary | ICD-10-CM | POA: Diagnosis not present

## 2016-04-04 DIAGNOSIS — R7309 Other abnormal glucose: Secondary | ICD-10-CM | POA: Diagnosis not present

## 2016-04-04 DIAGNOSIS — E441 Mild protein-calorie malnutrition: Secondary | ICD-10-CM | POA: Diagnosis not present

## 2016-04-04 DIAGNOSIS — Z23 Encounter for immunization: Secondary | ICD-10-CM | POA: Diagnosis not present

## 2016-04-04 DIAGNOSIS — Z681 Body mass index (BMI) 19 or less, adult: Secondary | ICD-10-CM | POA: Diagnosis not present

## 2016-04-04 DIAGNOSIS — M1991 Primary osteoarthritis, unspecified site: Secondary | ICD-10-CM | POA: Diagnosis not present

## 2016-04-04 DIAGNOSIS — R6251 Failure to thrive (child): Secondary | ICD-10-CM | POA: Diagnosis not present

## 2016-04-04 DIAGNOSIS — J449 Chronic obstructive pulmonary disease, unspecified: Secondary | ICD-10-CM | POA: Diagnosis not present

## 2016-04-04 DIAGNOSIS — I739 Peripheral vascular disease, unspecified: Secondary | ICD-10-CM | POA: Diagnosis not present

## 2016-04-05 DIAGNOSIS — I482 Chronic atrial fibrillation: Secondary | ICD-10-CM | POA: Diagnosis not present

## 2016-04-05 DIAGNOSIS — R260 Ataxic gait: Secondary | ICD-10-CM | POA: Diagnosis not present

## 2016-04-05 DIAGNOSIS — J449 Chronic obstructive pulmonary disease, unspecified: Secondary | ICD-10-CM | POA: Diagnosis not present

## 2016-04-05 DIAGNOSIS — M6281 Muscle weakness (generalized): Secondary | ICD-10-CM | POA: Diagnosis not present

## 2016-04-05 DIAGNOSIS — I2510 Atherosclerotic heart disease of native coronary artery without angina pectoris: Secondary | ICD-10-CM | POA: Diagnosis not present

## 2016-04-05 DIAGNOSIS — J9611 Chronic respiratory failure with hypoxia: Secondary | ICD-10-CM | POA: Diagnosis not present

## 2016-04-09 DIAGNOSIS — J449 Chronic obstructive pulmonary disease, unspecified: Secondary | ICD-10-CM | POA: Diagnosis not present

## 2016-04-09 DIAGNOSIS — I2510 Atherosclerotic heart disease of native coronary artery without angina pectoris: Secondary | ICD-10-CM | POA: Diagnosis not present

## 2016-04-09 DIAGNOSIS — I482 Chronic atrial fibrillation: Secondary | ICD-10-CM | POA: Diagnosis not present

## 2016-04-09 DIAGNOSIS — J9611 Chronic respiratory failure with hypoxia: Secondary | ICD-10-CM | POA: Diagnosis not present

## 2016-04-09 DIAGNOSIS — R260 Ataxic gait: Secondary | ICD-10-CM | POA: Diagnosis not present

## 2016-04-09 DIAGNOSIS — M6281 Muscle weakness (generalized): Secondary | ICD-10-CM | POA: Diagnosis not present

## 2016-04-10 DIAGNOSIS — J9611 Chronic respiratory failure with hypoxia: Secondary | ICD-10-CM | POA: Diagnosis not present

## 2016-04-10 DIAGNOSIS — I2510 Atherosclerotic heart disease of native coronary artery without angina pectoris: Secondary | ICD-10-CM | POA: Diagnosis not present

## 2016-04-10 DIAGNOSIS — M6281 Muscle weakness (generalized): Secondary | ICD-10-CM | POA: Diagnosis not present

## 2016-04-10 DIAGNOSIS — R260 Ataxic gait: Secondary | ICD-10-CM | POA: Diagnosis not present

## 2016-04-10 DIAGNOSIS — J449 Chronic obstructive pulmonary disease, unspecified: Secondary | ICD-10-CM | POA: Diagnosis not present

## 2016-04-10 DIAGNOSIS — I482 Chronic atrial fibrillation: Secondary | ICD-10-CM | POA: Diagnosis not present

## 2016-04-12 DIAGNOSIS — R260 Ataxic gait: Secondary | ICD-10-CM | POA: Diagnosis not present

## 2016-04-12 DIAGNOSIS — I482 Chronic atrial fibrillation: Secondary | ICD-10-CM | POA: Diagnosis not present

## 2016-04-12 DIAGNOSIS — M6281 Muscle weakness (generalized): Secondary | ICD-10-CM | POA: Diagnosis not present

## 2016-04-12 DIAGNOSIS — J449 Chronic obstructive pulmonary disease, unspecified: Secondary | ICD-10-CM | POA: Diagnosis not present

## 2016-04-12 DIAGNOSIS — I2510 Atherosclerotic heart disease of native coronary artery without angina pectoris: Secondary | ICD-10-CM | POA: Diagnosis not present

## 2016-04-12 DIAGNOSIS — J9611 Chronic respiratory failure with hypoxia: Secondary | ICD-10-CM | POA: Diagnosis not present

## 2016-04-15 DIAGNOSIS — R260 Ataxic gait: Secondary | ICD-10-CM | POA: Diagnosis not present

## 2016-04-15 DIAGNOSIS — I2510 Atherosclerotic heart disease of native coronary artery without angina pectoris: Secondary | ICD-10-CM | POA: Diagnosis not present

## 2016-04-15 DIAGNOSIS — H2511 Age-related nuclear cataract, right eye: Secondary | ICD-10-CM | POA: Diagnosis not present

## 2016-04-15 DIAGNOSIS — M6281 Muscle weakness (generalized): Secondary | ICD-10-CM | POA: Diagnosis not present

## 2016-04-15 DIAGNOSIS — J449 Chronic obstructive pulmonary disease, unspecified: Secondary | ICD-10-CM | POA: Diagnosis not present

## 2016-04-15 DIAGNOSIS — J9611 Chronic respiratory failure with hypoxia: Secondary | ICD-10-CM | POA: Diagnosis not present

## 2016-04-15 DIAGNOSIS — I482 Chronic atrial fibrillation: Secondary | ICD-10-CM | POA: Diagnosis not present

## 2016-04-16 DIAGNOSIS — R260 Ataxic gait: Secondary | ICD-10-CM | POA: Diagnosis not present

## 2016-04-16 DIAGNOSIS — J449 Chronic obstructive pulmonary disease, unspecified: Secondary | ICD-10-CM | POA: Diagnosis not present

## 2016-04-16 DIAGNOSIS — I482 Chronic atrial fibrillation: Secondary | ICD-10-CM | POA: Diagnosis not present

## 2016-04-16 DIAGNOSIS — J9611 Chronic respiratory failure with hypoxia: Secondary | ICD-10-CM | POA: Diagnosis not present

## 2016-04-16 DIAGNOSIS — M6281 Muscle weakness (generalized): Secondary | ICD-10-CM | POA: Diagnosis not present

## 2016-04-16 DIAGNOSIS — I2510 Atherosclerotic heart disease of native coronary artery without angina pectoris: Secondary | ICD-10-CM | POA: Diagnosis not present

## 2016-04-17 DIAGNOSIS — I2510 Atherosclerotic heart disease of native coronary artery without angina pectoris: Secondary | ICD-10-CM | POA: Diagnosis not present

## 2016-04-17 DIAGNOSIS — R260 Ataxic gait: Secondary | ICD-10-CM | POA: Diagnosis not present

## 2016-04-17 DIAGNOSIS — J449 Chronic obstructive pulmonary disease, unspecified: Secondary | ICD-10-CM | POA: Diagnosis not present

## 2016-04-17 DIAGNOSIS — J9611 Chronic respiratory failure with hypoxia: Secondary | ICD-10-CM | POA: Diagnosis not present

## 2016-04-17 DIAGNOSIS — I482 Chronic atrial fibrillation: Secondary | ICD-10-CM | POA: Diagnosis not present

## 2016-04-17 DIAGNOSIS — M6281 Muscle weakness (generalized): Secondary | ICD-10-CM | POA: Diagnosis not present

## 2016-04-18 ENCOUNTER — Ambulatory Visit: Payer: Medicare Other | Admitting: Gastroenterology

## 2016-04-18 DIAGNOSIS — H2511 Age-related nuclear cataract, right eye: Secondary | ICD-10-CM | POA: Diagnosis not present

## 2016-04-22 DIAGNOSIS — R260 Ataxic gait: Secondary | ICD-10-CM | POA: Diagnosis not present

## 2016-04-22 DIAGNOSIS — I2510 Atherosclerotic heart disease of native coronary artery without angina pectoris: Secondary | ICD-10-CM | POA: Diagnosis not present

## 2016-04-22 DIAGNOSIS — M6281 Muscle weakness (generalized): Secondary | ICD-10-CM | POA: Diagnosis not present

## 2016-04-22 DIAGNOSIS — I482 Chronic atrial fibrillation: Secondary | ICD-10-CM | POA: Diagnosis not present

## 2016-04-22 DIAGNOSIS — J9611 Chronic respiratory failure with hypoxia: Secondary | ICD-10-CM | POA: Diagnosis not present

## 2016-04-22 DIAGNOSIS — J449 Chronic obstructive pulmonary disease, unspecified: Secondary | ICD-10-CM | POA: Diagnosis not present

## 2016-04-23 DIAGNOSIS — J9611 Chronic respiratory failure with hypoxia: Secondary | ICD-10-CM | POA: Diagnosis not present

## 2016-04-23 DIAGNOSIS — I2510 Atherosclerotic heart disease of native coronary artery without angina pectoris: Secondary | ICD-10-CM | POA: Diagnosis not present

## 2016-04-23 DIAGNOSIS — R260 Ataxic gait: Secondary | ICD-10-CM | POA: Diagnosis not present

## 2016-04-23 DIAGNOSIS — I482 Chronic atrial fibrillation: Secondary | ICD-10-CM | POA: Diagnosis not present

## 2016-04-23 DIAGNOSIS — M6281 Muscle weakness (generalized): Secondary | ICD-10-CM | POA: Diagnosis not present

## 2016-04-23 DIAGNOSIS — J449 Chronic obstructive pulmonary disease, unspecified: Secondary | ICD-10-CM | POA: Diagnosis not present

## 2016-04-24 DIAGNOSIS — I482 Chronic atrial fibrillation: Secondary | ICD-10-CM | POA: Diagnosis not present

## 2016-04-24 DIAGNOSIS — R260 Ataxic gait: Secondary | ICD-10-CM | POA: Diagnosis not present

## 2016-04-24 DIAGNOSIS — M6281 Muscle weakness (generalized): Secondary | ICD-10-CM | POA: Diagnosis not present

## 2016-04-24 DIAGNOSIS — J449 Chronic obstructive pulmonary disease, unspecified: Secondary | ICD-10-CM | POA: Diagnosis not present

## 2016-04-24 DIAGNOSIS — I2510 Atherosclerotic heart disease of native coronary artery without angina pectoris: Secondary | ICD-10-CM | POA: Diagnosis not present

## 2016-04-24 DIAGNOSIS — J9611 Chronic respiratory failure with hypoxia: Secondary | ICD-10-CM | POA: Diagnosis not present

## 2016-04-25 DIAGNOSIS — J449 Chronic obstructive pulmonary disease, unspecified: Secondary | ICD-10-CM | POA: Diagnosis not present

## 2016-04-25 DIAGNOSIS — J9611 Chronic respiratory failure with hypoxia: Secondary | ICD-10-CM | POA: Diagnosis not present

## 2016-04-25 DIAGNOSIS — R260 Ataxic gait: Secondary | ICD-10-CM | POA: Diagnosis not present

## 2016-04-25 DIAGNOSIS — I2510 Atherosclerotic heart disease of native coronary artery without angina pectoris: Secondary | ICD-10-CM | POA: Diagnosis not present

## 2016-04-25 DIAGNOSIS — M6281 Muscle weakness (generalized): Secondary | ICD-10-CM | POA: Diagnosis not present

## 2016-04-25 DIAGNOSIS — I482 Chronic atrial fibrillation: Secondary | ICD-10-CM | POA: Diagnosis not present

## 2016-04-26 DIAGNOSIS — M6281 Muscle weakness (generalized): Secondary | ICD-10-CM | POA: Diagnosis not present

## 2016-04-26 DIAGNOSIS — I2510 Atherosclerotic heart disease of native coronary artery without angina pectoris: Secondary | ICD-10-CM | POA: Diagnosis not present

## 2016-04-26 DIAGNOSIS — I482 Chronic atrial fibrillation: Secondary | ICD-10-CM | POA: Diagnosis not present

## 2016-04-26 DIAGNOSIS — R260 Ataxic gait: Secondary | ICD-10-CM | POA: Diagnosis not present

## 2016-04-26 DIAGNOSIS — J9611 Chronic respiratory failure with hypoxia: Secondary | ICD-10-CM | POA: Diagnosis not present

## 2016-04-26 DIAGNOSIS — J449 Chronic obstructive pulmonary disease, unspecified: Secondary | ICD-10-CM | POA: Diagnosis not present

## 2016-04-30 DIAGNOSIS — M6281 Muscle weakness (generalized): Secondary | ICD-10-CM | POA: Diagnosis not present

## 2016-04-30 DIAGNOSIS — R260 Ataxic gait: Secondary | ICD-10-CM | POA: Diagnosis not present

## 2016-04-30 DIAGNOSIS — I482 Chronic atrial fibrillation: Secondary | ICD-10-CM | POA: Diagnosis not present

## 2016-04-30 DIAGNOSIS — I2510 Atherosclerotic heart disease of native coronary artery without angina pectoris: Secondary | ICD-10-CM | POA: Diagnosis not present

## 2016-04-30 DIAGNOSIS — J9611 Chronic respiratory failure with hypoxia: Secondary | ICD-10-CM | POA: Diagnosis not present

## 2016-04-30 DIAGNOSIS — J449 Chronic obstructive pulmonary disease, unspecified: Secondary | ICD-10-CM | POA: Diagnosis not present

## 2016-05-01 DIAGNOSIS — J9611 Chronic respiratory failure with hypoxia: Secondary | ICD-10-CM | POA: Diagnosis not present

## 2016-05-01 DIAGNOSIS — I482 Chronic atrial fibrillation: Secondary | ICD-10-CM | POA: Diagnosis not present

## 2016-05-01 DIAGNOSIS — R260 Ataxic gait: Secondary | ICD-10-CM | POA: Diagnosis not present

## 2016-05-01 DIAGNOSIS — J449 Chronic obstructive pulmonary disease, unspecified: Secondary | ICD-10-CM | POA: Diagnosis not present

## 2016-05-01 DIAGNOSIS — I2510 Atherosclerotic heart disease of native coronary artery without angina pectoris: Secondary | ICD-10-CM | POA: Diagnosis not present

## 2016-05-01 DIAGNOSIS — M6281 Muscle weakness (generalized): Secondary | ICD-10-CM | POA: Diagnosis not present

## 2016-05-03 DIAGNOSIS — J9611 Chronic respiratory failure with hypoxia: Secondary | ICD-10-CM | POA: Diagnosis not present

## 2016-05-03 DIAGNOSIS — I482 Chronic atrial fibrillation: Secondary | ICD-10-CM | POA: Diagnosis not present

## 2016-05-03 DIAGNOSIS — R260 Ataxic gait: Secondary | ICD-10-CM | POA: Diagnosis not present

## 2016-05-03 DIAGNOSIS — J449 Chronic obstructive pulmonary disease, unspecified: Secondary | ICD-10-CM | POA: Diagnosis not present

## 2016-05-03 DIAGNOSIS — I2510 Atherosclerotic heart disease of native coronary artery without angina pectoris: Secondary | ICD-10-CM | POA: Diagnosis not present

## 2016-05-03 DIAGNOSIS — M6281 Muscle weakness (generalized): Secondary | ICD-10-CM | POA: Diagnosis not present

## 2016-05-06 DIAGNOSIS — I482 Chronic atrial fibrillation: Secondary | ICD-10-CM | POA: Diagnosis not present

## 2016-05-06 DIAGNOSIS — I2510 Atherosclerotic heart disease of native coronary artery without angina pectoris: Secondary | ICD-10-CM | POA: Diagnosis not present

## 2016-05-06 DIAGNOSIS — R260 Ataxic gait: Secondary | ICD-10-CM | POA: Diagnosis not present

## 2016-05-06 DIAGNOSIS — M6281 Muscle weakness (generalized): Secondary | ICD-10-CM | POA: Diagnosis not present

## 2016-05-06 DIAGNOSIS — J449 Chronic obstructive pulmonary disease, unspecified: Secondary | ICD-10-CM | POA: Diagnosis not present

## 2016-05-06 DIAGNOSIS — J9611 Chronic respiratory failure with hypoxia: Secondary | ICD-10-CM | POA: Diagnosis not present

## 2016-05-08 DIAGNOSIS — I2510 Atherosclerotic heart disease of native coronary artery without angina pectoris: Secondary | ICD-10-CM | POA: Diagnosis not present

## 2016-05-08 DIAGNOSIS — I482 Chronic atrial fibrillation: Secondary | ICD-10-CM | POA: Diagnosis not present

## 2016-05-08 DIAGNOSIS — J449 Chronic obstructive pulmonary disease, unspecified: Secondary | ICD-10-CM | POA: Diagnosis not present

## 2016-05-08 DIAGNOSIS — R260 Ataxic gait: Secondary | ICD-10-CM | POA: Diagnosis not present

## 2016-05-08 DIAGNOSIS — J9611 Chronic respiratory failure with hypoxia: Secondary | ICD-10-CM | POA: Diagnosis not present

## 2016-05-08 DIAGNOSIS — M6281 Muscle weakness (generalized): Secondary | ICD-10-CM | POA: Diagnosis not present

## 2016-05-10 DIAGNOSIS — J9611 Chronic respiratory failure with hypoxia: Secondary | ICD-10-CM | POA: Diagnosis not present

## 2016-05-10 DIAGNOSIS — I482 Chronic atrial fibrillation: Secondary | ICD-10-CM | POA: Diagnosis not present

## 2016-05-10 DIAGNOSIS — I2510 Atherosclerotic heart disease of native coronary artery without angina pectoris: Secondary | ICD-10-CM | POA: Diagnosis not present

## 2016-05-10 DIAGNOSIS — M6281 Muscle weakness (generalized): Secondary | ICD-10-CM | POA: Diagnosis not present

## 2016-05-10 DIAGNOSIS — R260 Ataxic gait: Secondary | ICD-10-CM | POA: Diagnosis not present

## 2016-05-10 DIAGNOSIS — J449 Chronic obstructive pulmonary disease, unspecified: Secondary | ICD-10-CM | POA: Diagnosis not present

## 2016-05-13 DIAGNOSIS — I482 Chronic atrial fibrillation: Secondary | ICD-10-CM | POA: Diagnosis not present

## 2016-05-13 DIAGNOSIS — J449 Chronic obstructive pulmonary disease, unspecified: Secondary | ICD-10-CM | POA: Diagnosis not present

## 2016-05-13 DIAGNOSIS — M6281 Muscle weakness (generalized): Secondary | ICD-10-CM | POA: Diagnosis not present

## 2016-05-13 DIAGNOSIS — R260 Ataxic gait: Secondary | ICD-10-CM | POA: Diagnosis not present

## 2016-05-13 DIAGNOSIS — I2510 Atherosclerotic heart disease of native coronary artery without angina pectoris: Secondary | ICD-10-CM | POA: Diagnosis not present

## 2016-05-13 DIAGNOSIS — J9611 Chronic respiratory failure with hypoxia: Secondary | ICD-10-CM | POA: Diagnosis not present

## 2016-05-15 DIAGNOSIS — R260 Ataxic gait: Secondary | ICD-10-CM | POA: Diagnosis not present

## 2016-05-15 DIAGNOSIS — I2510 Atherosclerotic heart disease of native coronary artery without angina pectoris: Secondary | ICD-10-CM | POA: Diagnosis not present

## 2016-05-15 DIAGNOSIS — M6281 Muscle weakness (generalized): Secondary | ICD-10-CM | POA: Diagnosis not present

## 2016-05-15 DIAGNOSIS — J449 Chronic obstructive pulmonary disease, unspecified: Secondary | ICD-10-CM | POA: Diagnosis not present

## 2016-05-15 DIAGNOSIS — J9611 Chronic respiratory failure with hypoxia: Secondary | ICD-10-CM | POA: Diagnosis not present

## 2016-05-15 DIAGNOSIS — I482 Chronic atrial fibrillation: Secondary | ICD-10-CM | POA: Diagnosis not present

## 2016-05-16 DIAGNOSIS — J9611 Chronic respiratory failure with hypoxia: Secondary | ICD-10-CM | POA: Diagnosis not present

## 2016-05-16 DIAGNOSIS — I482 Chronic atrial fibrillation: Secondary | ICD-10-CM | POA: Diagnosis not present

## 2016-05-16 DIAGNOSIS — J449 Chronic obstructive pulmonary disease, unspecified: Secondary | ICD-10-CM | POA: Diagnosis not present

## 2016-05-16 DIAGNOSIS — R260 Ataxic gait: Secondary | ICD-10-CM | POA: Diagnosis not present

## 2016-05-16 DIAGNOSIS — M6281 Muscle weakness (generalized): Secondary | ICD-10-CM | POA: Diagnosis not present

## 2016-05-16 DIAGNOSIS — I2510 Atherosclerotic heart disease of native coronary artery without angina pectoris: Secondary | ICD-10-CM | POA: Diagnosis not present

## 2016-05-21 DIAGNOSIS — J449 Chronic obstructive pulmonary disease, unspecified: Secondary | ICD-10-CM | POA: Diagnosis not present

## 2016-05-21 DIAGNOSIS — I482 Chronic atrial fibrillation: Secondary | ICD-10-CM | POA: Diagnosis not present

## 2016-05-21 DIAGNOSIS — R260 Ataxic gait: Secondary | ICD-10-CM | POA: Diagnosis not present

## 2016-05-21 DIAGNOSIS — M6281 Muscle weakness (generalized): Secondary | ICD-10-CM | POA: Diagnosis not present

## 2016-05-21 DIAGNOSIS — J9611 Chronic respiratory failure with hypoxia: Secondary | ICD-10-CM | POA: Diagnosis not present

## 2016-05-21 DIAGNOSIS — I2510 Atherosclerotic heart disease of native coronary artery without angina pectoris: Secondary | ICD-10-CM | POA: Diagnosis not present

## 2016-05-23 DIAGNOSIS — R260 Ataxic gait: Secondary | ICD-10-CM | POA: Diagnosis not present

## 2016-05-23 DIAGNOSIS — I2510 Atherosclerotic heart disease of native coronary artery without angina pectoris: Secondary | ICD-10-CM | POA: Diagnosis not present

## 2016-05-23 DIAGNOSIS — M6281 Muscle weakness (generalized): Secondary | ICD-10-CM | POA: Diagnosis not present

## 2016-05-23 DIAGNOSIS — J449 Chronic obstructive pulmonary disease, unspecified: Secondary | ICD-10-CM | POA: Diagnosis not present

## 2016-05-23 DIAGNOSIS — I482 Chronic atrial fibrillation: Secondary | ICD-10-CM | POA: Diagnosis not present

## 2016-05-23 DIAGNOSIS — J9611 Chronic respiratory failure with hypoxia: Secondary | ICD-10-CM | POA: Diagnosis not present

## 2016-08-08 ENCOUNTER — Encounter (HOSPITAL_COMMUNITY): Payer: Medicare Other

## 2016-08-08 ENCOUNTER — Ambulatory Visit: Payer: Medicare Other | Admitting: Family

## 2016-08-23 ENCOUNTER — Emergency Department (HOSPITAL_COMMUNITY): Payer: Medicare Other

## 2016-08-23 ENCOUNTER — Inpatient Hospital Stay (HOSPITAL_COMMUNITY)
Admission: EM | Admit: 2016-08-23 | Discharge: 2016-08-25 | DRG: 379 | Disposition: A | Discharge status: Home / Self Care | Payer: Medicare Other | Attending: Internal Medicine | Admitting: Internal Medicine

## 2016-08-23 ENCOUNTER — Encounter (HOSPITAL_COMMUNITY): Payer: Self-pay

## 2016-08-23 DIAGNOSIS — I482 Chronic atrial fibrillation, unspecified: Secondary | ICD-10-CM | POA: Diagnosis present

## 2016-08-23 DIAGNOSIS — Z886 Allergy status to analgesic agent status: Secondary | ICD-10-CM

## 2016-08-23 DIAGNOSIS — K922 Gastrointestinal hemorrhage, unspecified: Secondary | ICD-10-CM | POA: Diagnosis not present

## 2016-08-23 DIAGNOSIS — Z8673 Personal history of transient ischemic attack (TIA), and cerebral infarction without residual deficits: Secondary | ICD-10-CM

## 2016-08-23 DIAGNOSIS — I6529 Occlusion and stenosis of unspecified carotid artery: Secondary | ICD-10-CM | POA: Diagnosis present

## 2016-08-23 DIAGNOSIS — D500 Iron deficiency anemia secondary to blood loss (chronic): Secondary | ICD-10-CM | POA: Diagnosis not present

## 2016-08-23 DIAGNOSIS — L8990 Pressure ulcer of unspecified site, unspecified stage: Secondary | ICD-10-CM | POA: Insufficient documentation

## 2016-08-23 DIAGNOSIS — K921 Melena: Secondary | ICD-10-CM | POA: Diagnosis present

## 2016-08-23 DIAGNOSIS — K219 Gastro-esophageal reflux disease without esophagitis: Secondary | ICD-10-CM | POA: Diagnosis present

## 2016-08-23 DIAGNOSIS — R339 Retention of urine, unspecified: Secondary | ICD-10-CM | POA: Diagnosis not present

## 2016-08-23 DIAGNOSIS — Z882 Allergy status to sulfonamides status: Secondary | ICD-10-CM

## 2016-08-23 DIAGNOSIS — Z888 Allergy status to other drugs, medicaments and biological substances status: Secondary | ICD-10-CM

## 2016-08-23 DIAGNOSIS — K5641 Fecal impaction: Secondary | ICD-10-CM | POA: Diagnosis present

## 2016-08-23 DIAGNOSIS — D649 Anemia, unspecified: Secondary | ICD-10-CM | POA: Diagnosis present

## 2016-08-23 DIAGNOSIS — R531 Weakness: Secondary | ICD-10-CM | POA: Diagnosis not present

## 2016-08-23 DIAGNOSIS — Z881 Allergy status to other antibiotic agents status: Secondary | ICD-10-CM

## 2016-08-23 DIAGNOSIS — I739 Peripheral vascular disease, unspecified: Secondary | ICD-10-CM | POA: Diagnosis present

## 2016-08-23 DIAGNOSIS — Z86718 Personal history of other venous thrombosis and embolism: Secondary | ICD-10-CM

## 2016-08-23 DIAGNOSIS — Z87891 Personal history of nicotine dependence: Secondary | ICD-10-CM

## 2016-08-23 DIAGNOSIS — Z9049 Acquired absence of other specified parts of digestive tract: Secondary | ICD-10-CM

## 2016-08-23 DIAGNOSIS — J449 Chronic obstructive pulmonary disease, unspecified: Secondary | ICD-10-CM | POA: Diagnosis present

## 2016-08-23 DIAGNOSIS — K649 Unspecified hemorrhoids: Secondary | ICD-10-CM | POA: Diagnosis present

## 2016-08-23 DIAGNOSIS — L89151 Pressure ulcer of sacral region, stage 1: Secondary | ICD-10-CM | POA: Diagnosis present

## 2016-08-23 DIAGNOSIS — I679 Cerebrovascular disease, unspecified: Secondary | ICD-10-CM | POA: Diagnosis present

## 2016-08-23 DIAGNOSIS — I10 Essential (primary) hypertension: Secondary | ICD-10-CM | POA: Diagnosis present

## 2016-08-23 DIAGNOSIS — Z90710 Acquired absence of both cervix and uterus: Secondary | ICD-10-CM

## 2016-08-23 DIAGNOSIS — R404 Transient alteration of awareness: Secondary | ICD-10-CM | POA: Diagnosis not present

## 2016-08-23 DIAGNOSIS — Z825 Family history of asthma and other chronic lower respiratory diseases: Secondary | ICD-10-CM

## 2016-08-23 DIAGNOSIS — I714 Abdominal aortic aneurysm, without rupture: Secondary | ICD-10-CM | POA: Diagnosis present

## 2016-08-23 DIAGNOSIS — Z885 Allergy status to narcotic agent status: Secondary | ICD-10-CM

## 2016-08-23 DIAGNOSIS — Z820 Family history of epilepsy and other diseases of the nervous system: Secondary | ICD-10-CM

## 2016-08-23 LAB — CBC WITH DIFFERENTIAL/PLATELET
BASOS ABS: 0 10*3/uL (ref 0.0–0.1)
Basophils Relative: 0 %
EOS ABS: 0.1 10*3/uL (ref 0.0–0.7)
EOS PCT: 1 %
HCT: 34.7 % — ABNORMAL LOW (ref 36.0–46.0)
HEMOGLOBIN: 11.5 g/dL — AB (ref 12.0–15.0)
LYMPHS ABS: 1.4 10*3/uL (ref 0.7–4.0)
Lymphocytes Relative: 14 %
MCH: 26.1 pg (ref 26.0–34.0)
MCHC: 33.1 g/dL (ref 30.0–36.0)
MCV: 78.9 fL (ref 78.0–100.0)
Monocytes Absolute: 1 10*3/uL (ref 0.1–1.0)
Monocytes Relative: 10 %
NEUTROS PCT: 75 %
Neutro Abs: 7.1 10*3/uL (ref 1.7–7.7)
PLATELETS: 211 10*3/uL (ref 150–400)
RBC: 4.4 MIL/uL (ref 3.87–5.11)
RDW: 17.7 % — ABNORMAL HIGH (ref 11.5–15.5)
WBC: 9.6 10*3/uL (ref 4.0–10.5)

## 2016-08-23 LAB — COMPREHENSIVE METABOLIC PANEL
ALK PHOS: 89 U/L (ref 38–126)
ALT: 14 U/L (ref 14–54)
AST: 18 U/L (ref 15–41)
Albumin: 3.6 g/dL (ref 3.5–5.0)
Anion gap: 8 (ref 5–15)
BUN: 14 mg/dL (ref 6–20)
CALCIUM: 8.8 mg/dL — AB (ref 8.9–10.3)
CO2: 33 mmol/L — ABNORMAL HIGH (ref 22–32)
CREATININE: 0.46 mg/dL (ref 0.44–1.00)
Chloride: 92 mmol/L — ABNORMAL LOW (ref 101–111)
Glucose, Bld: 97 mg/dL (ref 65–99)
Potassium: 3.6 mmol/L (ref 3.5–5.1)
SODIUM: 133 mmol/L — AB (ref 135–145)
TOTAL PROTEIN: 6.2 g/dL — AB (ref 6.5–8.1)
Total Bilirubin: 0.6 mg/dL (ref 0.3–1.2)

## 2016-08-23 LAB — PROTIME-INR
INR: 2.32
PROTHROMBIN TIME: 25.9 s — AB (ref 11.4–15.2)

## 2016-08-23 LAB — URINALYSIS, ROUTINE W REFLEX MICROSCOPIC
Bilirubin Urine: NEGATIVE
Glucose, UA: NEGATIVE mg/dL
HGB URINE DIPSTICK: NEGATIVE
Ketones, ur: NEGATIVE mg/dL
Leukocytes, UA: NEGATIVE
NITRITE: NEGATIVE
PROTEIN: NEGATIVE mg/dL
Specific Gravity, Urine: 1.013 (ref 1.005–1.030)
pH: 6 (ref 5.0–8.0)

## 2016-08-23 LAB — POC OCCULT BLOOD, ED: Fecal Occult Bld: POSITIVE — AB

## 2016-08-23 LAB — LIPASE, BLOOD

## 2016-08-23 MED ORDER — IOPAMIDOL (ISOVUE-300) INJECTION 61%
INTRAVENOUS | Status: AC
  Administered 2016-08-24: 100 mL
  Filled 2016-08-23: qty 100

## 2016-08-23 MED ORDER — IOPAMIDOL (ISOVUE-300) INJECTION 61%
INTRAVENOUS | Status: AC
  Administered 2016-08-24: 30 mL via ORAL
  Filled 2016-08-23: qty 30

## 2016-08-23 NOTE — ED Provider Notes (Signed)
[facility] DEPT Provider Note   CSN: [id number] Arrival date & time: 08/23/16  2036  By signing my name below, I, Charolotte Eke, attest that this documentation has been prepared under the direction and in the presence of Margette Fast, MD. Electronically Signed: Charolotte Eke, Scribe. 08/23/16. 9:00 PM.   History   Chief Complaint Chief Complaint  Patient presents with  . Urinary Retention    HPI Caroline [name] is a 82 y.o. female with h/o COPD, A fib, HTN, DVT, TIA, AAA without rupture 4.3 x 4.0 cm per Ct, chronic lower back pain who presents to the Emergency Department complaining of gradual onset, moderate, constant suprapubic abdominal pain that began at 3pm today. Pt has associated "peanut butter" like BMs, rectal bleeding, vaginal bleeding, rectal pain, urinary retention, hemorrhoids. Pt takes a fluid pill each day. Pt states that she normally has to urinate frequently but was unable to do so today since 3 PM. No fever, chills, or associated symptoms. No medication changes.    The history is provided by the patient. No language interpreter was used.    Past Medical History:  Diagnosis Date  . AAA (abdominal aortic aneurysm) without rupture ([place]) 01/24/2016   4.3x4.0 cm per CT   . Atrial fibrillation ([place])   . Cerebrovascular disease   . COPD (chronic obstructive pulmonary disease) ([place])   . CVA 10/25/2008   Qualifier: Diagnosis of  By: [name], CNA, Christy    . DVT (deep venous thrombosis) ([place])   . GERD (gastroesophageal reflux disease)   . Hypertension   . Internal carotid artery stenosis 05/13/2012   HIGH GRADE LEFT INTERNAL CAROTID STENOSIS.  S/p  L CEA 05/11/12  . Irregular heart beat   . PAD (peripheral artery disease) ([facility])    lower extremity PAD with bilateral leg stenting  . TIA (transient ischemic attack)     Patient Active Problem List   Diagnosis Date Noted  . CAP (community acquired pneumonia) 02/06/2016  . Fall   . Malnutrition of moderate degree 01/25/2016    . Hyperkalemia 01/25/2016  . Urinary retention 01/25/2016  . Inability to ambulate due to multiple joints 01/24/2016  . Facet arthropathy of spine 01/24/2016  . Chronic respiratory failure with hypoxia ([place]) 01/24/2016  . Low back pain at multiple sites 01/24/2016  . Chronic atrial fibrillation ([place]) 01/24/2016  . AAA (abdominal aortic aneurysm) without rupture ([place]) 01/24/2016  . PVD (peripheral vascular disease) ([place]) 01/24/2016  . Constipation 01/24/2016  . Colon adenomas 11/28/2014  . Rectal bleeding 06/30/2014  . Acute respiratory failure with hypoxia ([place]) 04/14/2014  . Pulmonary edema 04/14/2014  . Protein-calorie malnutrition, severe ([place]) 04/14/2014  . Hypotension 05/13/2012  . Internal carotid artery stenosis 05/13/2012  . TIA (transient ischemic attack) 05/05/2012  . Hemiballismus 05/03/2012  . Stroke ([place]) 05/02/2012  . COPD (chronic obstructive pulmonary disease) ([place]) 05/02/2012  . Peripheral vascular disease ([place]) 05/17/2010  . TOBACCO ABUSE 05/16/2010  . Carotid stenosis 11/06/2009  . FIBRILLATION, ATRIAL 10/25/2008  . CVA 10/25/2008  . LOW BACK PAIN, CHRONIC 10/25/2008    Past Surgical History:  Procedure Laterality Date  . APPENDECTOMY  1956  . CAROTID ANGIOGRAM N/A 05/06/2012   Procedure: CAROTID ANGIOGRAM;  Surgeon: [name], MD;  Location: [facility] CATH LAB;  Service: Cardiovascular;  Laterality: N/A;  . CAROTID ENDARTERECTOMY Left Nov. 4, 2013   CE  . CHOLECYSTECTOMY  1966   Gall Bladder  . COLONOSCOPY N/A 07/12/2014   [id number] cecal polyp/one colon polyp/redundant left  colon  . ENDARTERECTOMY  05/11/2012   Procedure: ENDARTERECTOMY CAROTID;  Surgeon: [name], MD;  Location: [facility] OR;  Service: Vascular;  Laterality: Left;  . ESOPHAGOGASTRODUODENOSCOPY N/A 07/12/2014   [id number] in the lower thrid of the esophagus  . EYE SURGERY Left Dec. 22, 2016   Cataract  . GALLBLADDER SURGERY    . MALONEY DILATION N/A 07/12/2014   Procedure: MALONEY  DILATION;  Surgeon: [name], MD;  Location: [facility] ENDO SUITE;  Service: Endoscopy;  Laterality: N/A;  . PARTIAL HYSTERECTOMY    . PATCH ANGIOPLASTY  05/11/2012   Procedure: PATCH ANGIOPLASTY;  Surgeon: [name], MD;  Location: [facility] OR;  Service: Vascular;  Laterality: Left;  with Dacron Patch Angioplasty  . SAVORY DILATION N/A 07/12/2014   Procedure: SAVORY DILATION;  Surgeon: [name], MD;  Location: [facility] ENDO SUITE;  Service: Endoscopy;  Laterality: N/A;    OB History    No data available       Home Medications    Prior to Admission medications   Medication Sig Start Date End Date Taking? Authorizing Provider  cilostazol (PLETAL) 100 MG tablet Take 100 mg by mouth daily.    Yes Historical Provider, MD  dabigatran (PRADAXA) 150 MG CAPS capsule Take 150 mg by mouth 2 (two) times daily.   Yes Historical Provider, MD  hydrochlorothiazide (MICROZIDE) 12.5 MG capsule Take 12.5 mg by mouth daily.   Yes Historical Provider, MD  levalbuterol Penne Lash) 0.63 MG/3ML nebulizer solution Take 3 mLs (0.63 mg total) by nebulization every 4 (four) hours as needed for wheezing or shortness of breath. 01/27/16  Yes Rexene Alberts, MD  Multiple Vitamins-Minerals (PRESERVISION AREDS 2+MULTI VIT) CAPS Take 1 capsule by mouth 2 (two) times daily.   Yes Historical Provider, MD  OXYGEN Inhale 2 L into the lungs continuous.    Yes Historical Provider, MD  potassium chloride SA (K-DUR,KLOR-CON) 20 MEQ tablet Take 20 mEq by mouth daily.   Yes Historical Provider, MD  senna-docusate (SENOKOT-S) 8.6-50 MG tablet Take 2 tablets by mouth at bedtime. For constipation Patient taking differently: Take 1-2 tablets by mouth at bedtime as needed for mild constipation. For constipation 01/27/16  Yes Rexene Alberts, MD  tiotropium (SPIRIVA HANDIHALER) 18 MCG inhalation capsule Place 18 mcg into inhaler and inhale at bedtime. Place 10 mcg into inhaler and inhale at bedtime    Yes Historical Provider, MD  verapamil  (VERELAN PM) 240 MG 24 hr capsule Take 240 mg by mouth daily.   Yes Historical Provider, MD    Family History Family History  Problem Relation Age of Onset  . Deep vein thrombosis Sister     Blood Clot in leg  . Alzheimer's disease Brother   . COPD Sister   . COPD Sister     Social History Social History  Substance Use Topics  . Smoking status: Former Smoker    Packs/day: 1.00    Quit date: 02/23/2014  . Smokeless tobacco: Former Systems developer  . Alcohol use No     Allergies   Ibuprofen; Pnu-imune [pneumococcal polysaccharide vaccine]; Advair diskus [fluticasone-salmeterol]; Albuterol; Amoxicillin; Cephalexin; Codeine; Entex pac [pseudoephedrine-gg & dm]; Etanercept; Fentanyl; Gualenic acid; Lorazepam; Nubain [nalbuphine hcl]; Oxycodone hcl; Oxycodone-acetaminophen; Roxicodone [oxycodone hcl]; Sulfate; Tramadol; and Tylox [oxycodone-acetaminophen]   Review of Systems Review of Systems  Gastrointestinal: Positive for abdominal distention, anal bleeding, blood in stool and rectal pain.  Endocrine:       Urinary retention.   Genitourinary: Positive for difficulty urinating, vaginal  bleeding and vaginal pain. Negative for frequency.    A complete 10 system review of systems was obtained and all systems are negative except as noted in the HPI and PMH.    Physical Exam Updated Vital Signs BP 109/97 (BP Location: Right Arm)   Pulse 80   Temp 98 F (36.7 C) (Oral)   Resp 19   Ht 5\' 4"  (1.626 m)   Wt 85 lb (38.6 kg)   SpO2 98%   BMI 14.59 kg/m   Physical Exam  Constitutional: She is oriented to person, place, and time. She appears well-developed and well-nourished. No distress.  HENT:  Head: Atraumatic.  Mouth/Throat: Oropharynx is clear and moist.  Eyes: Conjunctivae are normal. Pupils are equal, round, and reactive to light.  Cardiovascular: Normal rate and regular rhythm.  Exam reveals no gallop and no friction rub.   No murmur heard. Pulmonary/Chest: Effort normal and  breath sounds normal. No stridor.  Abdominal: Soft. There is tenderness.  Tenderness in the suprapubic region with firm mass, presumably bladder.   Genitourinary: Rectal exam shows guaiac positive stool. No vaginal discharge found.  Genitourinary Comments: 1 cm area or erythema  And mild tenderness on the left lower labia. No fluctuance. No drainage. No active bleeding.   Musculoskeletal: Normal range of motion. She exhibits no edema or deformity.  Neurological: She is alert and oriented to person, place, and time. No sensory deficit.  Skin: Skin is warm and dry. She is not diaphoretic.  Psychiatric: She has a normal mood and affect.     ED Treatments / Results   DIAGNOSTIC STUDIES: Oxygen Saturation is 98% on room air, normal by my interpretation.    COORDINATION OF CARE: 8:53 PM Discussed treatment plan with pt at bedside and pt agreed to plan, which includes the placement of a foley and a rectal exam.  Labs (all labs ordered are listed, but only abnormal results are displayed) Labs Reviewed  COMPREHENSIVE METABOLIC PANEL - Abnormal; Notable for the following:       Result Value   Sodium 133 (*)    Chloride 92 (*)    CO2 33 (*)    Calcium 8.8 (*)    Total Protein 6.2 (*)    All other components within normal limits  LIPASE, BLOOD - Abnormal; Notable for the following:    Lipase <10 (*)    All other components within normal limits  CBC WITH DIFFERENTIAL/PLATELET - Abnormal; Notable for the following:    Hemoglobin 11.5 (*)    HCT 34.7 (*)    RDW 17.7 (*)    All other components within normal limits  URINALYSIS, ROUTINE W REFLEX MICROSCOPIC - Abnormal; Notable for the following:    APPearance HAZY (*)    All other components within normal limits  PROTIME-INR - Abnormal; Notable for the following:    Prothrombin Time 25.9 (*)    All other components within normal limits  POC OCCULT BLOOD, ED - Abnormal; Notable for the following:    Fecal Occult Bld POSITIVE (*)    All  other components within normal limits  TYPE AND SCREEN   Radiology  CT ordered and pending.   Procedures Procedures (including critical care time)  None  Medications Ordered in ED Medications  iopamidol (ISOVUE-300) 61 % injection (not administered)  iopamidol (ISOVUE-300) 61 % injection (not administered)     Initial Impression / Assessment and Plan / ED Course  I have reviewed the triage vital signs and the nursing notes.  Pertinent labs & imaging results that were available during my care of the patient were reviewed by me and considered in my medical decision making (see chart for details).  Patient resents to the emergency department for evaluation of urinary retention and blood in the stool. On exam she does have external hemorrhoids but stool appears maroon in color. No bright red blood. No melena. Also has an area of cellulitis over the labia with no fluctuance or drainage. This area does not appear to be bleeding and is not located near the urethra. Suspicion that this is causing the patient's urinary retention. Patient is hemoccult positive and on Pradaxa. Will keep foley in place with 450 ml of urine on placement of foley. Spoke with Dr. Olevia Bowens with hospitalist group and will obtain CT.  Discussed patient's case with hospitalist, Dr. Olevia Bowens. Patient and family (if present) updated with plan. Care transferred to hospitalist service.  I reviewed all nursing notes, vitals, pertinent old records, EKGs, labs, imaging (as available).    Final Clinical Impressions(s) / ED Diagnoses   Final diagnoses:  Gastrointestinal hemorrhage, unspecified gastrointestinal hemorrhage type  Urinary retention    I personally performed the services described in this documentation, which was scribed in my presence. The recorded information has been reviewed and is accurate.    Nanda Quinton, MD    Margette Fast, MD 08/24/16 0000

## 2016-08-23 NOTE — ED Notes (Signed)
436 cc noted in bladder scan

## 2016-08-23 NOTE — ED Notes (Signed)
Pt with second runny and bloody stool.  Cleaned pt and changed linen on bed

## 2016-08-23 NOTE — ED Triage Notes (Signed)
Pt states she has not been able to urinate since 3 pm today, denies previous history of same

## 2016-08-24 ENCOUNTER — Encounter (HOSPITAL_COMMUNITY): Payer: Self-pay | Admitting: Internal Medicine

## 2016-08-24 DIAGNOSIS — L8990 Pressure ulcer of unspecified site, unspecified stage: Secondary | ICD-10-CM | POA: Insufficient documentation

## 2016-08-24 DIAGNOSIS — I679 Cerebrovascular disease, unspecified: Secondary | ICD-10-CM | POA: Diagnosis present

## 2016-08-24 DIAGNOSIS — Z881 Allergy status to other antibiotic agents status: Secondary | ICD-10-CM | POA: Diagnosis not present

## 2016-08-24 DIAGNOSIS — R109 Unspecified abdominal pain: Secondary | ICD-10-CM | POA: Diagnosis not present

## 2016-08-24 DIAGNOSIS — D649 Anemia, unspecified: Secondary | ICD-10-CM | POA: Diagnosis present

## 2016-08-24 DIAGNOSIS — Z9049 Acquired absence of other specified parts of digestive tract: Secondary | ICD-10-CM | POA: Diagnosis not present

## 2016-08-24 DIAGNOSIS — D500 Iron deficiency anemia secondary to blood loss (chronic): Secondary | ICD-10-CM | POA: Diagnosis present

## 2016-08-24 DIAGNOSIS — Z886 Allergy status to analgesic agent status: Secondary | ICD-10-CM | POA: Diagnosis not present

## 2016-08-24 DIAGNOSIS — Z8673 Personal history of transient ischemic attack (TIA), and cerebral infarction without residual deficits: Secondary | ICD-10-CM | POA: Diagnosis not present

## 2016-08-24 DIAGNOSIS — J449 Chronic obstructive pulmonary disease, unspecified: Secondary | ICD-10-CM | POA: Diagnosis present

## 2016-08-24 DIAGNOSIS — Z885 Allergy status to narcotic agent status: Secondary | ICD-10-CM | POA: Diagnosis not present

## 2016-08-24 DIAGNOSIS — Z90710 Acquired absence of both cervix and uterus: Secondary | ICD-10-CM | POA: Diagnosis not present

## 2016-08-24 DIAGNOSIS — Z87891 Personal history of nicotine dependence: Secondary | ICD-10-CM | POA: Diagnosis not present

## 2016-08-24 DIAGNOSIS — Z888 Allergy status to other drugs, medicaments and biological substances status: Secondary | ICD-10-CM | POA: Diagnosis not present

## 2016-08-24 DIAGNOSIS — I482 Chronic atrial fibrillation: Secondary | ICD-10-CM | POA: Diagnosis not present

## 2016-08-24 DIAGNOSIS — I739 Peripheral vascular disease, unspecified: Secondary | ICD-10-CM | POA: Diagnosis not present

## 2016-08-24 DIAGNOSIS — Z86718 Personal history of other venous thrombosis and embolism: Secondary | ICD-10-CM | POA: Diagnosis not present

## 2016-08-24 DIAGNOSIS — R339 Retention of urine, unspecified: Secondary | ICD-10-CM | POA: Diagnosis not present

## 2016-08-24 DIAGNOSIS — I714 Abdominal aortic aneurysm, without rupture: Secondary | ICD-10-CM | POA: Diagnosis present

## 2016-08-24 DIAGNOSIS — Z820 Family history of epilepsy and other diseases of the nervous system: Secondary | ICD-10-CM | POA: Diagnosis not present

## 2016-08-24 DIAGNOSIS — K921 Melena: Secondary | ICD-10-CM | POA: Diagnosis present

## 2016-08-24 DIAGNOSIS — K649 Unspecified hemorrhoids: Secondary | ICD-10-CM | POA: Diagnosis present

## 2016-08-24 DIAGNOSIS — L89151 Pressure ulcer of sacral region, stage 1: Secondary | ICD-10-CM | POA: Diagnosis present

## 2016-08-24 DIAGNOSIS — Z882 Allergy status to sulfonamides status: Secondary | ICD-10-CM | POA: Diagnosis not present

## 2016-08-24 DIAGNOSIS — Z825 Family history of asthma and other chronic lower respiratory diseases: Secondary | ICD-10-CM | POA: Diagnosis not present

## 2016-08-24 DIAGNOSIS — I10 Essential (primary) hypertension: Secondary | ICD-10-CM | POA: Diagnosis present

## 2016-08-24 DIAGNOSIS — K219 Gastro-esophageal reflux disease without esophagitis: Secondary | ICD-10-CM | POA: Diagnosis present

## 2016-08-24 LAB — BASIC METABOLIC PANEL
Anion gap: 8 (ref 5–15)
BUN: 10 mg/dL (ref 6–20)
CO2: 31 mmol/L (ref 22–32)
CREATININE: 0.48 mg/dL (ref 0.44–1.00)
Calcium: 8.2 mg/dL — ABNORMAL LOW (ref 8.9–10.3)
Chloride: 97 mmol/L — ABNORMAL LOW (ref 101–111)
GFR calc Af Amer: >60 mL/min (ref >60)
Glucose, Bld: 85 mg/dL (ref 65–99)
Potassium: 3.3 mmol/L — ABNORMAL LOW (ref 3.5–5.1)
SODIUM: 136 mmol/L (ref 135–145)

## 2016-08-24 LAB — TYPE AND SCREEN
ABO/RH(D): O POS
ANTIBODY SCREEN: NEGATIVE

## 2016-08-24 LAB — PHOSPHORUS: Phosphorus: 3.5 mg/dL (ref 2.5–4.6)

## 2016-08-24 LAB — HEMOGLOBIN
HEMOGLOBIN: 10.6 g/dL — AB (ref 12.0–15.0)
Hemoglobin: 10.8 g/dL — ABNORMAL LOW (ref 12.0–15.0)

## 2016-08-24 LAB — MAGNESIUM: Magnesium: 1.6 mg/dL — ABNORMAL LOW (ref 1.7–2.4)

## 2016-08-24 MED ORDER — POLYETHYLENE GLYCOL 3350 17 G PO PACK
17.0000 g | PACK | Freq: Two times a day (BID) | ORAL | Status: DC
  Administered 2016-08-24 – 2016-08-25 (×3): 17 g via ORAL
  Filled 2016-08-24 (×3): qty 1

## 2016-08-24 MED ORDER — ACETAMINOPHEN 325 MG PO TABS
650.0000 mg | ORAL_TABLET | Freq: Four times a day (QID) | ORAL | Status: DC | PRN
  Administered 2016-08-24: 650 mg via ORAL
  Filled 2016-08-24: qty 2

## 2016-08-24 MED ORDER — LEVALBUTEROL HCL 0.63 MG/3ML IN NEBU: 0.6300 mg | INHALATION_SOLUTION | RESPIRATORY_TRACT | Status: DC | PRN

## 2016-08-24 MED ORDER — ORAL CARE MOUTH RINSE
15.0000 mL | Freq: Two times a day (BID) | OROMUCOSAL | Status: DC
  Administered 2016-08-24 – 2016-08-25 (×2): 15 mL via OROMUCOSAL

## 2016-08-24 MED ORDER — TIOTROPIUM BROMIDE MONOHYDRATE 18 MCG IN CAPS
ORAL_CAPSULE | RESPIRATORY_TRACT | Status: AC
  Filled 2016-08-24: qty 5

## 2016-08-24 MED ORDER — VERAPAMIL HCL ER 240 MG PO TBCR
240.0000 mg | EXTENDED_RELEASE_TABLET | Freq: Every day | ORAL | Status: DC
Start: 2016-08-24 — End: 2016-08-25
  Administered 2016-08-24 – 2016-08-25 (×2): 240 mg via ORAL
  Filled 2016-08-24 (×2): qty 1

## 2016-08-24 MED ORDER — POTASSIUM CHLORIDE IN NACL 40-0.9 MEQ/L-% IV SOLN
INTRAVENOUS | Status: AC
  Administered 2016-08-24: 75 mL/h via INTRAVENOUS

## 2016-08-24 MED ORDER — FAMOTIDINE 20 MG PO TABS
20.0000 mg | ORAL_TABLET | Freq: Two times a day (BID) | ORAL | Status: DC
  Administered 2016-08-24 – 2016-08-25 (×3): 20 mg via ORAL
  Filled 2016-08-24 (×3): qty 1

## 2016-08-24 MED ORDER — FAMOTIDINE IN NACL 20-0.9 MG/50ML-% IV SOLN
20.0000 mg | Freq: Two times a day (BID) | INTRAVENOUS | Status: DC
  Administered 2016-08-24 (×2): 20 mg via INTRAVENOUS
  Filled 2016-08-24 (×2): qty 50

## 2016-08-24 MED ORDER — HYDROCORTISONE 2.5 % RE CREA
TOPICAL_CREAM | Freq: Two times a day (BID) | RECTAL | Status: DC
  Administered 2016-08-24 – 2016-08-25 (×3): via RECTAL
  Filled 2016-08-24: qty 28.35

## 2016-08-24 MED ORDER — TIOTROPIUM BROMIDE MONOHYDRATE 18 MCG IN CAPS
18.0000 ug | ORAL_CAPSULE | Freq: Every day | RESPIRATORY_TRACT | Status: DC
  Administered 2016-08-24: 18 ug via RESPIRATORY_TRACT
  Filled 2016-08-24: qty 5

## 2016-08-24 MED ORDER — ONDANSETRON HCL 4 MG/2ML IJ SOLN: 4.0000 mg | Freq: Three times a day (TID) | INTRAMUSCULAR | Status: DC | PRN

## 2016-08-24 MED ORDER — TAMSULOSIN HCL 0.4 MG PO CAPS
0.4000 mg | ORAL_CAPSULE | Freq: Every day | ORAL | Status: DC
  Administered 2016-08-24 – 2016-08-25 (×2): 0.4 mg via ORAL
  Filled 2016-08-24 (×2): qty 1

## 2016-08-24 NOTE — Consult Note (Signed)
. Referring Provider: Orvan Falconer, MD Primary Care Physician:  Glo Herring., MD Primary Gastroenterologist:  Dr. Laural Golden  Reason for Consultation:    Hematochezia.  HPI:   Patient is 82 year old Caucasian female who presented to emergency room with acute urinary retention yesterday. Foley catheter was placed for bladder decompression. She was noted to have heme-positive stool on rectal exam by ER physician. According to her daughter who is at bedside there was some blood coating the stool. She denies rectal bleeding or melena. He has history of constipation and has been using Dulcolax one as-needed basis. She states she had what she believes to be normal stool 2 days ago and yesterday all she passed was few bits and pieces. She has not experienced numbness or lower extremity weakness. She has chronic low back pain secondary to spinal stenosis/arthritis. She does not take OTC NSAIDs. She has been on anticoagulant for several years. She denies history of peptic ulcer disease. Her daughter states she has been under a lot of stress and she lost her husband last year. All in all she lost 40 pounds. She states her weight now has leveled off. Patient lives alone. She prepares her own meals. Other house chores are generally performed by her 2 daughters.  She has history of colonic adenomas. She had colonoscopy by Dr. Oneida Alar in January 2016. He was found to have large flat adenoma at cecum. She was referred to Dr. Olegario Messier of [facility] and underwent submucosal resection in March 2016. She believes she was advised to return for follow-up exam in 2-3 years.   Past Medical History:  Diagnosis Date  . AAA (abdominal aortic aneurysm) without rupture ([place]) 01/24/2016   4.3x4.0 cm per CT   . Atrial fibrillation ([place])   . Cerebrovascular disease   . COPD (chronic obstructive pulmonary disease) ([place])   . CVA 10/25/2008   Qualifier: Diagnosis of  By: [name], CNA, Christy    . DVT (deep venous thrombosis) ([place])    . GERD (gastroesophageal reflux disease)   . Hypertension   . Internal carotid artery stenosis 05/13/2012   HIGH GRADE LEFT INTERNAL CAROTID STENOSIS.  S/p  L CEA 05/11/12  . Irregular heart beat   . PAD (peripheral artery disease) (HCC)    lower extremity PAD with bilateral leg stenting  . TIA (transient ischemic attack)     Past Surgical History:  Procedure Laterality Date  . APPENDECTOMY  1956  . CAROTID ANGIOGRAM N/A 05/06/2012   Procedure: CAROTID ANGIOGRAM;  Surgeon: [name], MD;  Location: [facility] CATH LAB;  Service: Cardiovascular;  Laterality: N/A;  . CAROTID ENDARTERECTOMY Left Nov. 4, 2013   CE  . CHOLECYSTECTOMY  1966   Gall Bladder  . COLONOSCOPY N/A 07/12/2014   [id number] cecal polyp/one colon polyp/redundant left colon  . ENDARTERECTOMY  05/11/2012   Procedure: ENDARTERECTOMY CAROTID;  Surgeon: [name], MD;  Location: [facility] OR;  Service: Vascular;  Laterality: Left;  . ESOPHAGOGASTRODUODENOSCOPY N/A 07/12/2014   [id number] in the lower thrid of the esophagus  . EYE SURGERY Left Dec. 22, 2016   Cataract  . GALLBLADDER SURGERY    . MALONEY DILATION N/A 07/12/2014   Procedure: MALONEY DILATION;  Surgeon: [name], MD;  [facility] END[facility];  Service: Endoscopy;  Laterality: N/A;  . PARTIAL HYSTERECTOMY    . PATCH ANGIOPLASTY  05/11/2012   Procedure: PATCH ANGIOPLASTY;  Surgeon: [name], MD;  Location: [place];  Service: Vascular;  Laterality: Left;  with Dacron  Patch Angioplasty  . SAVORY DILATION N/A 07/12/2014   Procedure: SAVORY DILATION;  Surgeon: [name], MD;  Location: [facility] END[facility];  Service: Endoscopy;  Laterality: N/A;    Prior to Admission medications   Medication Sig Start Date End Date Taking? Authorizing Provider  cilostazol (PLETAL) 100 MG tablet Take 100 mg by mouth daily.    Yes Historical Provider, MD  dabigatran (PRADAXA) 150 MG CAPS capsule Take 150 mg by mouth 2 (two) times daily.   Yes Historical Provider, MD   hydrochlorothiazide (MICROZIDE) 12.5 MG capsule Take 12.5 mg by mouth daily.   Yes Historical Provider, MD  levalbuterol Penne Lash) 0.63 MG/3ML nebulizer solution Take 3 mLs (0.63 mg total) by nebulization every 4 (four) hours as needed for wheezing or shortness of breath. 01/27/16  Yes Rexene Alberts, MD  Multiple Vitamins-Minerals (PRESERVISION AREDS 2+MULTI VIT) CAPS Take 1 capsule by mouth 2 (two) times daily.   Yes Historical Provider, MD  OXYGEN Inhale 2 L into the lungs continuous.    Yes Historical Provider, MD  potassium chloride SA (K-DUR,KLOR-CON) 20 MEQ tablet Take 20 mEq by mouth daily.   Yes Historical Provider, MD  senna-docusate (SENOKOT-S) 8.6-50 MG tablet Take 2 tablets by mouth at bedtime. For constipation Patient taking differently: Take 1-2 tablets by mouth at bedtime as needed for mild constipation. For constipation 01/27/16  Yes Rexene Alberts, MD  tiotropium (SPIRIVA HANDIHALER) 18 MCG inhalation capsule Place 18 mcg into inhaler and inhale at bedtime. Place 10 mcg into inhaler and inhale at bedtime    Yes Historical Provider, MD  verapamil (VERELAN PM) 240 MG 24 hr capsule Take 240 mg by mouth daily.   Yes Historical Provider, MD    Current Facility-Administered Medications  Medication Dose Route Frequency Provider Last Rate Last Dose  . 0.9 % NaCl with KCl 40 mEq / L  infusion   Intravenous Continuous Reubin Milan, MD 75 mL/hr at 08/24/16 0426 75 mL/hr at 08/24/16 0426  . famotidine (PEPCID) IVPB 20 mg premix  20 mg Intravenous Q12H Reubin Milan, MD 100 mL/hr at 08/24/16 0926 20 mg at 08/24/16 0926  . hydrocortisone (ANUSOL-HC) 2.5 % rectal cream   Rectal BID Rogene [place], MD      . levalbuterol Penne Lash) nebulizer solution 0.63 mg  0.63 mg Nebulization Q4H PRN Reubin Milan, MD      . MEDLINE mouth rinse  15 mL Mouth Rinse BID Reubin Milan, MD   15 mL at 08/24/16 0926  . ondansetron (ZOFRAN) injection 4 mg  4 mg Intravenous Q8H PRN Reubin Milan, MD      . polyethylene glycol (MIRALAX / Floria Raveling) packet 17 g  17 g Oral BID Rogene [place], MD      . tamsulosin (FLOMAX) capsule 0.4 mg  0.4 mg Oral Daily Orvan Falconer, MD      . tiotropium [facility]) inhalation capsule 18 mcg  18 mcg Inhalation QHS Reubin Milan, MD      . verapamil (CALAN-SR) CR tablet 240 mg  240 mg Oral Daily Reubin Milan, MD   240 mg at 08/24/16 [id number]    Allergies as of 08/23/2016 - Review Complete 08/23/2016  Allergen Reaction Noted  . Ibuprofen    . Pnu-imune [pneumococcal polysaccharide vaccine]  06/30/2014  . Advair diskus [fluticasone-salmeterol]    . Albuterol  06/04/2010  . Amoxicillin Hives   . Cephalexin Hives   . Codeine    . Entex pac [pseudoephedrine-gg &  dm] Other (See Comments) 04/23/2012  . Etanercept    . Fentanyl  02/06/2016  . Gualenic acid Other (See Comments) 04/23/2012  . Lorazepam    . Nubain [nalbuphine hcl]  04/23/2012  . Oxycodone hcl Nausea And Vomiting   . Oxycodone-acetaminophen Nausea And Vomiting   . Roxicodone [oxycodone hcl]  04/13/2014  . Sulfate  08/23/2016  . Tramadol  02/06/2016  . Tylox [oxycodone-acetaminophen]  04/13/2014    Family History  Problem Relation Age of Onset  . Deep vein thrombosis Sister     Blood Clot in leg  . Alzheimer's disease Brother   . COPD Sister   . COPD Sister     Social History   Social History  . Marital status: Married    Spouse name: N/A  . Number of children: N/A  . Years of education: N/A   Occupational History  . unemployed Retired   Social History Main Topics  . Smoking status: Former Smoker    Packs/day: 1.00    Quit date: 02/23/2014  . Smokeless tobacco: Former Systems developer  . Alcohol use No  . Drug use: No  . Sexual activity: No   Other Topics Concern  . Not on file   Social History Narrative  . No narrative on file    Review of Systems: See HPI, otherwise normal ROS  Physical Exam: Temp:  [98 F (36.7 C)-98.1 F (36.7 C)] 98 F (36.7 C)  (02/17 1327) Pulse Rate:  [49-112] 76 (02/17 1327) Resp:  [15-33] 18 (02/17 1327) BP: (109-138)/(59-97) 123/59 (02/17 1327) SpO2:  [89 %-100 %] 98 % (02/17 1327) Weight:  [85 lb (38.6 kg)] 85 lb (38.6 kg) (02/16 2044) Last BM Date: 08/24/16  Patient is alert and in no acute distress. She has generalized wasting. Conjunctiva was pink. Sclerae nonicteric. Oropharyngeal mucosa is dry. She has to upper dentures. No neck masses or thyromegaly noted. Cardiac exam with regular rhythm normal S1 and S2. No murmur or gallop noted. Auscultation of lungs revealed diminished intensity of breath sounds bilaterally. Abdomen is symmetrical. Bowel sounds are normal. On palpation is soft and nontender without organomegaly or masses. Rectal examination reveals soft sentinel skin tags and large stool mass in the rectum. It is soft. Stool is brown. She also has indurated tender area involving left labia. She has large dressing covering sacral decubitus. No peripheral edema or clubbing noted.  Intake/Output from previous day: 02/16 0701 - 02/17 0700 In: 192.5 [I.V.:192.5] Out: 1500 [Urine:1500] Intake/Output this shift: No intake/output data recorded.  Lab Results:  Recent Labs  08/23/16 2149 08/24/16 0720 08/24/16 1033  WBC 9.6  --   --   HGB 11.5* 10.6* 10.8*  HCT 34.7*  --   --   PLT 211  --   --    BMET  Recent Labs  08/23/16 2149 08/24/16 1033  NA 133* 136  K 3.6 3.3*  CL 92* 97*  CO2 33* 31  GLUCOSE 97 85  BUN 14 10  CREATININE 0.46 0.48  CALCIUM 8.8* 8.2*   LFT  Recent Labs  08/23/16 2149  PROT 6.2*  ALBUMIN 3.6  AST 18  ALT 14  ALKPHOS 89  BILITOT 0.6   PT/INR  Recent Labs  08/23/16 2149  LABPROT 25.9*  INR 2.32   Hepatitis Panel No results for input(s): HEPBSAG, HCVAB, HEPAIGM, HEPBIGM in the last 72 hours.  Studies/Results: Ct Abdomen Pelvis W Contrast  Result Date: 08/24/2016 CLINICAL DATA:  Abdominal pain in urinary retention. Gradual onset of  moderate constant suprapubic abdominal pain beginning at 3 p.m. today. Rectal and vaginal bleeding, rectal pain, urinary retention, hemorrhoids, chronic low back pain. EXAM: CT ABDOMEN AND PELVIS WITH CONTRAST TECHNIQUE: Multidetector CT imaging of the abdomen and pelvis was performed using the standard protocol following bolus administration of intravenous contrast. CONTRAST:  149mL ISOVUE-300 IOPAMIDOL (ISOVUE-300) INJECTION 61%, 47mL ISOVUE-300 IOPAMIDOL (ISOVUE-300) INJECTION 61% COMPARISON:  CT abdomen and pelvis 06/30/2014. CT lumbar spine 01/24/2016 FINDINGS: Lower chest: Emphysematous changes in the lung bases. Focal tree-in-bud opacities in the right lung base posteriorly and in the periphery of the left lung. This could represent chronic interstitial change or acute bronchopneumonia. No pleural effusions. Small esophageal hiatal hernia. Hepatobiliary: Surgical absence of the gallbladder. Bile duct dilatation is likely normal for postcholecystectomy patient. No focal liver lesions. Pancreas: Unremarkable. No pancreatic ductal dilatation or surrounding inflammatory changes. Spleen: Normal in size without focal abnormality. Adrenals/Urinary Tract: No adrenal gland nodules. Bilateral renal cysts, largest in the upper pole right kidney. Nephrograms are symmetrical. No hydronephrosis or hydroureter. Bladder is decompressed with a Foley catheter. Stomach/Bowel: Stomach is unremarkable. Small bowel are mildly prominent and fluid-filled, likely representing ileus. Colon is diffusely fluid-filled with prominent stool in the rectum and rectosigmoid region, extending into the descending and transverse colon. There is mild wall thickening of the rectum. Changes could indicate stercoral colitis or possibly stool impaction. Appendix is not identified. Vascular/Lymphatic: There is an infrarenal abdominal aortic aneurysm, today measuring 4.1 cm AP dimension by 4 cm transversely. This represents no significant change since  previous CT lumbar spine 01/24/2016. Circumferential calcification without much mural thrombus. Iliac arteries are normal caliber and appear patent with diffuse calcification. No evidence of retroperitoneal hematoma or fluid collection. No significant lymphadenopathy. Reproductive: Uterus is not visualized and may be surgically absent. No abnormal adnexal masses. Other: No free air or free fluid in the abdomen. Abdominal wall musculature appears intact although thinned. Musculoskeletal: Degenerative changes in the spine. Displaced fracture of the sacrum at the junction of S1-2. Fractures of the sacral ala bilaterally without displacement. These changes are present on the previous CT lumbar spine from 01/24/2016. IMPRESSION: 1. 4.1 cm diameter abdominal aortic aneurysm, unchanged since previous lumbar spine CT of 01/24/2016. No changes to suggest rupture. 2. Stool and fluid filled the colon with prominent stool in the rectum. Mild rectal wall thickening may represent stercoral colitis or stool impaction. Mild prominence of fluid-filled small bowel is likely ileus. 3. Emphysematous changes in the lung bases with focal areas of tree-in-bud opacity possibly representing chronic interstitial change or acute bronchopneumonia. 4. Old sacral fractures. Electronically Signed   By: Lucienne Capers M.D.   On: 08/24/2016 02:09   Abdominopelvic CT reviewed. Markedly dilated rectum with mild thickening to wall tenderness full of stool. Other abnormalities include 4.1 cm AAA, old sacral fractures and emphysematous changes involving both lungs.  Assessment;  Patient is 82 year old Caucasian female with multiple medical problems and poor health who presents with acute urinary retention and noted to have had hematochezia. There is no history of frank rectal bleeding or melena.  Rectal exam reveals fecal impaction. She has markedly dilated rectum suggesting chronic constipation and she will need to be on medication daily  rather than when necessary. Hematochezia may be secondary to hemorrhoids or proctitis resulting from fecal impaction. She has mild anemia. Insignificant drop in H&H most likely due to hydration.  GI history significant for colonic adenomas and she has undergone 2 colonoscopies in the last two years. I do not  believe there is indication for colonoscopy at this time unless she has frank rectal bleeding.  Acute urinary retention may be secondary to fecal impaction. She does have history of spinal stenosis but there has been no change in her gait according to her daughter.   Recommendations;  Heart healthy diet Polyethylene glycol 17 g by mouth twice a day until impaction cleared after which dose could be reduced to once a day. Anusol HC cream to be applied to perianal area twice a day. Change famotidine to 20 mg by mouth twice a day for primarily for GI prophylaxis. CBC with a.m. Lab.    LOS: 0 days   Lavaris Sexson  08/24/2016, 2:32 PM

## 2016-08-24 NOTE — Progress Notes (Signed)
PROGRESS NOTE    Caroline [name]  [id number] DOB: 1934/12/15 DOA: 08/23/2016 PCP: Glo Herring., MD    Brief Narrative:  Patient is a 82 yo female with chronic atrial fib on Pradaxa, AAA, CVA, TIA DVT, GERD, HTN, carotid stenosis s/p CEA 2013, admitted for urinary retention and rectal bleeding.  Foley was placed with 400 cc, H/H have been stable, and her Pradaxa was held.  GI has been consulted.  AP CT showed stable 4.1cm AAA, ? Ileus.  Assessment & Plan:   Principal Problem:   Hematochezia Active Problems:   Carotid stenosis   Peripheral vascular disease (HCC)   COPD (chronic obstructive pulmonary disease) (HCC)   Chronic atrial fibrillation (H[facility])   Urinary retention   Anemia   Pressure injury of skin   Urinary retention:  Will try voiding trial tomorrow.  Start Flomax daily.  She has not been on any anticholinergic medication.    Rectal bleeding:  Will await GI's recommendation.  She had a polyp removed at [place] 2 years ago.  No other abnormality found per patient.   Continue with serial H and H and PRBCs as required.   Afib:  She has CHADSVASC of 8.  Will resume Pradaxa when recommended by GI.   COPD:  Stable.   Continue with Meds.    PVD:  Pletal being held.   DVT prophylaxis: Pradaxa, being held.  Code Status: FULL CODE.  Family Communication: family aware.  Disposition Plan: Inpatient, complicated lower GI bleed in the setting of anticoagulation.   Consultants:   GI.   Procedures: None.   Antimicrobials: Anti-infectives    None       Subjective:  Feeling better.   Objective: Vitals:   08/24/16 0100 08/24/16 0130 08/24/16 0230 08/24/16 0300  BP: 138/71 126/79 124/68 128/65  Pulse: 69 82 91 83  Resp: 20 16 21 18   Temp:    98.1 F (36.7 C)  TempSrc:    Oral  SpO2: 95% 100% 95% 99%  Weight:      Height:        Intake/Output Summary (Last 24 hours) at 08/24/16 1204 Last data filed at 08/24/16 0700  Gross per 24 hour  Intake             192.5 ml  Output             1500 ml  Net          -1307.5 ml   Filed Weights   08/23/16 2044  Weight: 38.6 kg (85 lb)    Examination:  General exam: Appears calm and comfortable  Respiratory system: Clear to auscultation. Respiratory effort normal. Cardiovascular system: S1 & S2 heard, RRR. No JVD, murmurs, rubs, gallops or clicks. No pedal edema. Gastrointestinal system: Abdomen is nondistended, soft and nontender. No organomegaly or masses felt. Normal bowel sounds heard. Central nervous system: Alert and oriented. No focal neurological deficits. Extremities: Symmetric 5 x 5 power. Skin: No rashes, lesions or ulcers Psychiatry: Judgement and insight appear normal. Mood & affect appropriate.   Data Reviewed: I have personally reviewed following labs and imaging studies  CBC:  Recent Labs Lab 08/23/16 2149 08/24/16 0720 08/24/16 1033  WBC 9.6  --   --   NEUTROABS 7.1  --   --   HGB 11.5* 10.6* 10.8*  HCT 34.7*  --   --   MCV 78.9  --   --   PLT 211  --   --    Basic  Metabolic Panel:  Recent Labs Lab 08/23/16 2149 08/24/16 0720 08/24/16 1033  NA 133*  --  136  K 3.6  --  3.3*  CL 92*  --  97*  CO2 33*  --  31  GLUCOSE 97  --  85  BUN 14  --  10  CREATININE 0.46  --  0.48  CALCIUM 8.8*  --  8.2*  MG  --  1.6*  --   PHOS  --  3.5  --    GFR: Estimated Creatinine Clearance: 34.7 mL/min (by C-G formula based on SCr of 0.48 mg/dL). Liver Function Tests:  Recent Labs Lab 08/23/16 2149  AST 18  ALT 14  ALKPHOS 89  BILITOT 0.6  PROT 6.2*  ALBUMIN 3.6    Recent Labs Lab 08/23/16 2149  LIPASE <10*   No results for input(s): AMMONIA in the last 168 hours. Coagulation Profile:  Recent Labs Lab 08/23/16 2149  INR 2.32   Cardiac Enzymes:  Radiology Studies: Ct Abdomen Pelvis W Contrast  Result Date: 08/24/2016 CLINICAL DATA:  Abdominal pain in urinary retention. Gradual onset of moderate constant suprapubic abdominal pain beginning at 3 p.m.  today. Rectal and vaginal bleeding, rectal pain, urinary retention, hemorrhoids, chronic low back pain. EXAM: CT ABDOMEN AND PELVIS WITH CONTRAST TECHNIQUE: Multidetector CT imaging of the abdomen and pelvis was performed using the standard protocol following bolus administration of intravenous contrast. CONTRAST:  164mL ISOVUE-300 IOPAMIDOL (ISOVUE-300) INJECTION 61%, 34mL ISOVUE-300 IOPAMIDOL (ISOVUE-300) INJECTION 61% COMPARISON:  CT abdomen and pelvis 06/30/2014. CT lumbar spine 01/24/2016 FINDINGS: Lower chest: Emphysematous changes in the lung bases. Focal tree-in-bud opacities in the right lung base posteriorly and in the periphery of the left lung. This could represent chronic interstitial change or acute bronchopneumonia. No pleural effusions. Small esophageal hiatal hernia. Hepatobiliary: Surgical absence of the gallbladder. Bile duct dilatation is likely normal for postcholecystectomy patient. No focal liver lesions. Pancreas: Unremarkable. No pancreatic ductal dilatation or surrounding inflammatory changes. Spleen: Normal in size without focal abnormality. Adrenals/Urinary Tract: No adrenal gland nodules. Bilateral renal cysts, largest in the upper pole right kidney. Nephrograms are symmetrical. No hydronephrosis or hydroureter. Bladder is decompressed with a Foley catheter. Stomach/Bowel: Stomach is unremarkable. Small bowel are mildly prominent and fluid-filled, likely representing ileus. Colon is diffusely fluid-filled with prominent stool in the rectum and rectosigmoid region, extending into the descending and transverse colon. There is mild wall thickening of the rectum. Changes could indicate stercoral colitis or possibly stool impaction. Appendix is not identified. Vascular/Lymphatic: There is an infrarenal abdominal aortic aneurysm, today measuring 4.1 cm AP dimension by 4 cm transversely. This represents no significant change since previous CT lumbar spine 01/24/2016. Circumferential  calcification without much mural thrombus. Iliac arteries are normal caliber and appear patent with diffuse calcification. No evidence of retroperitoneal hematoma or fluid collection. No significant lymphadenopathy. Reproductive: Uterus is not visualized and may be surgically absent. No abnormal adnexal masses. Other: No free air or free fluid in the abdomen. Abdominal wall musculature appears intact although thinned. Musculoskeletal: Degenerative changes in the spine. Displaced fracture of the sacrum at the junction of S1-2. Fractures of the sacral ala bilaterally without displacement. These changes are present on the previous CT lumbar spine from 01/24/2016. IMPRESSION: 1. 4.1 cm diameter abdominal aortic aneurysm, unchanged since previous lumbar spine CT of 01/24/2016. No changes to suggest rupture. 2. Stool and fluid filled the colon with prominent stool in the rectum. Mild rectal wall thickening may represent stercoral colitis or stool  impaction. Mild prominence of fluid-filled small bowel is likely ileus. 3. Emphysematous changes in the lung bases with focal areas of tree-in-bud opacity possibly representing chronic interstitial change or acute bronchopneumonia. 4. Old sacral fractures. Electronically Signed   By: Lucienne Capers M.D.   On: 08/24/2016 02:09    Scheduled Meds: . famotidine (PEPCID) IV  20 mg Intravenous Q12H  . mouth rinse  15 mL Mouth Rinse BID  . tiotropium  18 mcg Inhalation QHS  . verapamil  240 mg Oral Daily   Continuous Infusions: . 0.9 % NaCl with KCl 40 mEq / L 75 mL/hr (08/24/16 0426)     LOS: 0 days   Zarielle Cea, MD FACP Hospitalist.   If 7PM-7AM, please contact night-coverage [email or web address] Password [facility] 08/24/2016, 12:04 PM

## 2016-08-24 NOTE — H&P (Signed)
History and Physical    Caroline [name] [id number] DOB: August 19, 1934 DOA: 08/23/2016  PCP: Glo Herring., MD   Patient coming from: Home.  Chief Complaint: Urinary retention.  HPI: Caroline [name] is a 82 y.o. female with medical history significant of AAA, chronic atrial fibrillation on Pradaxa, CVA, TIA, DVT, GERD, hypertension, carotid artery stenosis (S/P CEA in 2013), peripheral arterial disease on Pletal, previous rectal bleeding who is coming to the emergency department around 2030 due to urinary retention since about 1500 today. The patient had over 400 mL of urine on bladder scan.   While in the emergency department, the patient had 2 episodes of hematochezia. She denies nausea, emesis, diarrhea or constipation. She denies dysuria or frequency and states that her suprapubic area pain has resolved after Foley cath was inserted. She denies fever, chills, chest pain, palpitations, worsening dyspnea, dizziness, diaphoresis, PND, orthopnea or pitting edema lower extremities.  ED Course: Foley catheter was inserted, urine was strained with intermediate improvement of her pain. Urine analysis showed hazy color, but was otherwise negative. WBC 9.6, hemoglobin 11.5 g/dL and platelets 211. PT 25.9 and INR 2.32. Sodium 133, potassium 3.6, chloride 92, bicarbonate 33 mmol/L. Stool guaiac was positive.  Imaging: CT scan of the abdomen/pelvis with contrast shows stable AAA, the stool and fluid filled colon with mild rectal wall thickening. Possible ileus. Emphysematous changes with chronic interstitial lung disease or bronchopneumonia.  Review of Systems: As per HPI otherwise 10 point review of systems negative.    Past Medical History:  Diagnosis Date  . AAA (abdominal aortic aneurysm) without rupture ([place]) 01/24/2016   4.3x4.0 cm per CT   . Atrial fibrillation ([place])   . Cerebrovascular disease   . COPD (chronic obstructive pulmonary disease) ([place])   . CVA 10/25/2008   Qualifier: Diagnosis  of  By: [name], CNA, Christy    . DVT (deep venous thrombosis) ([place])   . GERD (gastroesophageal reflux disease)   . Hypertension   . Internal carotid artery stenosis 05/13/2012   HIGH GRADE LEFT INTERNAL CAROTID STENOSIS.  S/p  L CEA 05/11/12  . Irregular heart beat   . PAD (peripheral artery disease) (HCC)    lower extremity PAD with bilateral leg stenting  . TIA (transient ischemic attack)     Past Surgical History:  Procedure Laterality Date  . APPENDECTOMY  1956  . CAROTID ANGIOGRAM N/A 05/06/2012   Procedure: CAROTID ANGIOGRAM;  Surgeon: [name], MD;  Location: [facility] CATH LAB;  Service: Cardiovascular;  Laterality: N/A;  . CAROTID ENDARTERECTOMY Left Nov. 4, 2013   CE  . CHOLECYSTECTOMY  1966   Gall Bladder  . COLONOSCOPY N/A 07/12/2014   [id number] cecal polyp/one colon polyp/redundant left colon  . ENDARTERECTOMY  05/11/2012   Procedure: ENDARTERECTOMY CAROTID;  Surgeon: [name], MD;  Location: [facility] OR;  Service: Vascular;  Laterality: Left;  . ESOPHAGOGASTRODUODENOSCOPY N/A 07/12/2014   [id number] in the lower thrid of the esophagus  . EYE SURGERY Left Dec. 22, 2016   Cataract  . GALLBLADDER SURGERY    . MALONEY DILATION N/A 07/12/2014   Procedure: MALONEY DILATION;  Surgeon: [name], MD;  Location: [facility] ENDO SUITE;  Service: Endoscopy;  Laterality: N/A;  . PARTIAL HYSTERECTOMY    . PATCH ANGIOPLASTY  05/11/2012   Procedure: PATCH ANGIOPLASTY;  Surgeon: [name], MD;  Location: [facility] OR;  Service: Vascular;  Laterality: Left;  with Dacron Patch Angioplasty  . SAVORY DILATION N/A 07/12/2014   Procedure: SAVORY  DILATION;  Surgeon: [name], MD;  Location: [facility] ENDO SUITE;  Service: Endoscopy;  Laterality: N/A;     reports that she quit smoking about 2 years ago. She smoked 1.00 pack per day. She has quit using smokeless tobacco. She reports that she does not drink alcohol or use drugs.  Allergies  Allergen Reactions  . Ibuprofen     Gi upset  .  Pnu-Imune [Pneumococcal Polysaccharide Vaccine]     Huge knot at site and rash  . Advair Diskus [Fluticasone-Salmeterol]     Confusion and shortness of breath  . Albuterol     Confusion and shortness of breath  . Amoxicillin Hives  . Cephalexin Hives  . Codeine     confusion  . Entex Pac [Pseudoephedrine-Gg & Dm] Other (See Comments)    Upper respiratory.   . Etanercept     vertigo  . Fentanyl     Hallucinations '  . Gualenic Acid Other (See Comments)    unknown  . Lorazepam     hallucinations  . Nubain [Nalbuphine Hcl]   . Oxycodone Hcl Nausea And Vomiting  . Oxycodone-Acetaminophen Nausea And Vomiting  . Roxicodone [Oxycodone Hcl]   . Sulfate   . Tramadol     hallucinations   . Tylox [Oxycodone-Acetaminophen]     Family History  Problem Relation Age of Onset  . Deep vein thrombosis Sister     Blood Clot in leg  . Alzheimer's disease Brother   . COPD Sister   . COPD Sister     Prior to Admission medications   Medication Sig Start Date End Date Taking? Authorizing Provider  cilostazol (PLETAL) 100 MG tablet Take 100 mg by mouth daily.    Yes Historical Provider, MD  dabigatran (PRADAXA) 150 MG CAPS capsule Take 150 mg by mouth 2 (two) times daily.   Yes Historical Provider, MD  hydrochlorothiazide (MICROZIDE) 12.5 MG capsule Take 12.5 mg by mouth daily.   Yes Historical Provider, MD  levalbuterol Penne Lash) 0.63 MG/3ML nebulizer solution Take 3 mLs (0.63 mg total) by nebulization every 4 (four) hours as needed for wheezing or shortness of breath. 01/27/16  Yes Rexene Alberts, MD  Multiple Vitamins-Minerals (PRESERVISION AREDS 2+MULTI VIT) CAPS Take 1 capsule by mouth 2 (two) times daily.   Yes Historical Provider, MD  OXYGEN Inhale 2 L into the lungs continuous.    Yes Historical Provider, MD  potassium chloride SA (K-DUR,KLOR-CON) 20 MEQ tablet Take 20 mEq by mouth daily.   Yes Historical Provider, MD  senna-docusate (SENOKOT-S) 8.6-50 MG tablet Take 2 tablets by mouth  at bedtime. For constipation Patient taking differently: Take 1-2 tablets by mouth at bedtime as needed for mild constipation. For constipation 01/27/16  Yes Rexene Alberts, MD  tiotropium (SPIRIVA HANDIHALER) 18 MCG inhalation capsule Place 18 mcg into inhaler and inhale at bedtime. Place 10 mcg into inhaler and inhale at bedtime    Yes Historical Provider, MD  verapamil (VERELAN PM) 240 MG 24 hr capsule Take 240 mg by mouth daily.   Yes Historical Provider, MD    Physical Exam:  Constitutional: NAD, calm, comfortable Vitals:   08/23/16 2200 08/23/16 2215 08/23/16 2304 08/24/16 0033  BP: 123/67  109/97 123/77  Pulse: 99 88 80 89  Resp: (!) 33 21 19 22   Temp:      TempSrc:      SpO2: 98% 100% 98% 99%  Weight:      Height:       Eyes: PERRL, lids  and conjunctivae normal ENMT: Mucous membranes are moist. Posterior pharynx clear of any exudate or lesions. Neck: normal, supple, no masses, no thyromegaly Respiratory: clear to auscultation bilaterally, no wheezing, no crackles. Normal respiratory effort. No accessory muscle use.  Cardiovascular: Irregularly irregular, no murmurs / rubs / gallops. No extremity edema. 2+ pedal pulses. No carotid bruits.  Abdomen: Soft, no tenderness after bladder drainage with Foley cath, no masses palpated. No hepatosplenomegaly. Bowel sounds positive.  GU per Dr. Laverta Baltimore: 1 cm area or erythema  And mild tenderness on the left lower labia. No fluctuance. No drainage. No active bleeding.  Musculoskeletal: no clubbing / cyanosis. Good ROM, no contractures. Normal muscle tone.  Skin: no rashes, lesions, ulcers. No induration Neurologic: CN 2-12 grossly intact. Sensation intact, DTR normal. Strength 5/5 in all 4.  Psychiatric: Normal judgment and insight. Alert and oriented x 3. Normal mood.   Labs on Admission: I have personally reviewed following labs and imaging studies  CBC:  Recent Labs Lab 08/23/16 2149  WBC 9.6  NEUTROABS 7.1  HGB 11.5*  HCT 34.7*    MCV 78.9  PLT 123456   Basic Metabolic Panel:  Recent Labs Lab 08/23/16 2149  NA 133*  K 3.6  CL 92*  CO2 33*  GLUCOSE 97  BUN 14  CREATININE 0.46  CALCIUM 8.8*   GFR: Estimated Creatinine Clearance: 34.7 mL/min (by C-G formula based on SCr of 0.46 mg/dL). Liver Function Tests:  Recent Labs Lab 08/23/16 2149  AST 18  ALT 14  ALKPHOS 89  BILITOT 0.6  PROT 6.2*  ALBUMIN 3.6    Recent Labs Lab 08/23/16 2149  LIPASE <10*   No results for input(s): AMMONIA in the last 168 hours. Coagulation Profile:  Recent Labs Lab 08/23/16 2149  INR 2.32   Cardiac Enzymes: No results for input(s): CKTOTAL, CKMB, CKMBINDEX, TROPONINI in the last 168 hours. BNP (last 3 results) No results for input(s): PROBNP in the last 8760 hours. HbA1C: No results for input(s): HGBA1C in the last 72 hours. CBG: No results for input(s): GLUCAP in the last 168 hours. Lipid Profile: No results for input(s): CHOL, HDL, LDLCALC, TRIG, CHOLHDL, LDLDIRECT in the last 72 hours. Thyroid Function Tests: No results for input(s): TSH, T4TOTAL, FREET4, T3FREE, THYROIDAB in the last 72 hours. Anemia Panel: No results for input(s): VITAMINB12, FOLATE, FERRITIN, TIBC, IRON, RETICCTPCT in the last 72 hours. Urine analysis:    Component Value Date/Time   COLORURINE YELLOW 08/23/2016 2105   APPEARANCEUR HAZY (A) 08/23/2016 2105   LABSPEC 1.013 08/23/2016 2105   PHURINE 6.0 08/23/2016 2105   GLUCOSEU NEGATIVE 08/23/2016 2105   HGBUR NEGATIVE 08/23/2016 2105   BILIRUBINUR NEGATIVE 08/23/2016 2105   KETONESUR NEGATIVE 08/23/2016 2105   PROTEINUR NEGATIVE 08/23/2016 2105   UROBILINOGEN 2.0 (H) 06/30/2014 1244   NITRITE NEGATIVE 08/23/2016 2105   LEUKOCYTESUR NEGATIVE 08/23/2016 2105   Echocardiogram 04/15/2014  ------------------------------------------------------------------- LV EF: 60% -  65%  ------------------------------------------------------------------- Indications:   Dyspnea  786.09.  ------------------------------------------------------------------- History:  PMH:  Atrial fibrillation. Stroke. Chronic obstructive pulmonary disease. Risk factors: Current tobacco use. Hypertension.  ------------------------------------------------------------------- Study Conclusions  - Procedure narrative: Transthoracic echocardiography. Image quality was suboptimal. The study was technically difficult, as a result of poor acoustic windows and poor sound wave transmission. - Left ventricle: The cavity size was normal. Wall thickness was normal. Systolic function was normal. The estimated ejection fraction was in the range of 60% to 65%. Wall motion was normal; there were no regional wall motion  abnormalities. The study was not technically sufficient to allow evaluation of LV diastolic dysfunction due to atrial fibrillation. Doppler parameters are consistent with elevated mean left atrial filling pressure. - Aortic valve: Mildly calcified annulus. Mildly thickened, moderately calcified leaflets. Peak velocity and mean gradient likely underestimated. There appears to be at least a mild degree of aortic stenosis from a morphologic standpoint. - Mitral valve: Mildly calcified annulus. There was trivial regurgitation. - Right atrium: The atrium was mildly dilated. - Tricuspid valve: There was mild regurgitation. - Pulmonary arteries: PA peak pressure: 50 mm Hg (S). Moderately elevated pulmonary pressures. - Inferior vena cava: The vessel was dilated. The respirophasic diameter changes were blunted (< 50%), consistent with elevated central venous pressure.  Radiological Exams on Admission:  CLINICAL DATA:  Abdominal pain in urinary retention. Gradual onset of moderate constant suprapubic abdominal pain beginning at 3 p.m. today. Rectal and vaginal bleeding, rectal pain, urinary retention, hemorrhoids, chronic low back  pain.  EXAM: CT ABDOMEN AND PELVIS WITH CONTRAST  TECHNIQUE: Multidetector CT imaging of the abdomen and pelvis was performed using the standard protocol following bolus administration of intravenous contrast.  CONTRAST:  164mL ISOVUE-300 IOPAMIDOL (ISOVUE-300) INJECTION 61%, 66mL ISOVUE-300 IOPAMIDOL (ISOVUE-300) INJECTION 61%  COMPARISON:  CT abdomen and pelvis 06/30/2014. CT lumbar spine 01/24/2016  FINDINGS: Lower chest: Emphysematous changes in the lung bases. Focal tree-in-bud opacities in the right lung base posteriorly and in the periphery of the left lung. This could represent chronic interstitial change or acute bronchopneumonia. No pleural effusions. Small esophageal hiatal hernia.  Hepatobiliary: Surgical absence of the gallbladder. Bile duct dilatation is likely normal for postcholecystectomy patient. No focal liver lesions.  Pancreas: Unremarkable. No pancreatic ductal dilatation or surrounding inflammatory changes.  Spleen: Normal in size without focal abnormality.  Adrenals/Urinary Tract: No adrenal gland nodules. Bilateral renal cysts, largest in the upper pole right kidney. Nephrograms are symmetrical. No hydronephrosis or hydroureter. Bladder is decompressed with a Foley catheter.  Stomach/Bowel: Stomach is unremarkable. Small bowel are mildly prominent and fluid-filled, likely representing ileus. Colon is diffusely fluid-filled with prominent stool in the rectum and rectosigmoid region, extending into the descending and transverse colon. There is mild wall thickening of the rectum. Changes could indicate stercoral colitis or possibly stool impaction. Appendix is not identified.  Vascular/Lymphatic: There is an infrarenal abdominal aortic aneurysm, today measuring 4.1 cm AP dimension by 4 cm transversely. This represents no significant change since previous CT lumbar spine 01/24/2016. Circumferential calcification without much  mural thrombus. Iliac arteries are normal caliber and appear patent with diffuse calcification. No evidence of retroperitoneal hematoma or fluid collection. No significant lymphadenopathy.  Reproductive: Uterus is not visualized and may be surgically absent. No abnormal adnexal masses.  Other: No free air or free fluid in the abdomen. Abdominal wall musculature appears intact although thinned.  Musculoskeletal: Degenerative changes in the spine. Displaced fracture of the sacrum at the junction of S1-2. Fractures of the sacral ala bilaterally without displacement. These changes are present on the previous CT lumbar spine from 01/24/2016.  IMPRESSION: 1. 4.1 cm diameter abdominal aortic aneurysm, unchanged since previous lumbar spine CT of 01/24/2016. No changes to suggest rupture. 2. Stool and fluid filled the colon with prominent stool in the rectum. Mild rectal wall thickening may represent stercoral colitis or stool impaction. Mild prominence of fluid-filled small bowel is likely ileus. 3. Emphysematous changes in the lung bases with focal areas of tree-in-bud opacity possibly representing chronic interstitial change or acute bronchopneumonia. 4. Old sacral fractures.  Electronically Signed   By: Lucienne Capers M.D.   On: 08/24/2016 02:09   Assessment/Plan Principal Problem:   Hematochezia Admit to telemetry/observation. Keep nothing by mouth. Pradaxa and Pletal have been held. Monitor hematocrit and hemoglobin. Consult GI in a.m.  Active Problems:   Urinary retention Currently has Foley catheter. She states that the pain has been greatly relieved. Unknown etiology at this point, since imaging is not offering new information.    Carotid stenosis Stable. S/P CEA in 2013 Pradaxa and Pletal are on hold.    Peripheral vascular disease ([place]) The patient states that she has frequent cramps and claudication. Pletal has been held due to rectal bleeding. Will  check magnesium level on next blood draw to rule out hypomagnesemia induced cramps.      Chronic atrial fibrillation (HCC) CHA2DS2-VASc Score of 8. Continue verapamil 240 mg by mouth daily. Pradaxa has been held due to rectal bleeding.    COPD (chronic obstructive pulmonary disease) (HCC) Continue supplemental oxygen. Continue Levalbuterol every 4 hours as needed B and neb.    Anemia Secondary to rectal bleeding. Pradaxa has been held. Monitor hematocrit and hemoglobin. Consult GI in the morning.    DVT prophylaxis: On Pradaxa. Held due to hematochezia. Code Status: Full code. Family Communication: Two of her daughters are present in the room. Disposition Plan: Admit for hematocrit and hemoglobin trending. Consults called: Gastroenterology routine consult for a.m. Admission status: Observation/telemetry.   Reubin Milan MD [facility] Pager [phone].  If 7PM-7AM, please contact night-coverage [email or web address] Password [email or web address]  08/24/2016, 12:57 AM

## 2016-08-25 DIAGNOSIS — I482 Chronic atrial fibrillation: Secondary | ICD-10-CM

## 2016-08-25 DIAGNOSIS — K921 Melena: Principal | ICD-10-CM

## 2016-08-25 DIAGNOSIS — I739 Peripheral vascular disease, unspecified: Secondary | ICD-10-CM

## 2016-08-25 LAB — CBC
HEMATOCRIT: 32.5 % — AB (ref 36.0–46.0)
HEMOGLOBIN: 10.6 g/dL — AB (ref 12.0–15.0)
MCH: 26 pg (ref 26.0–34.0)
MCHC: 32.6 g/dL (ref 30.0–36.0)
MCV: 79.9 fL (ref 78.0–100.0)
Platelets: 186 10*3/uL (ref 150–400)
RBC: 4.07 MIL/uL (ref 3.87–5.11)
RDW: 18 % — ABNORMAL HIGH (ref 11.5–15.5)
WBC: 5.8 10*3/uL (ref 4.0–10.5)

## 2016-08-25 MED ORDER — HYDROCORTISONE 2.5 % RE CREA: TOPICAL_CREAM | Freq: Two times a day (BID) | RECTAL | 0 refills | Status: AC

## 2016-08-25 MED ORDER — POLYETHYLENE GLYCOL 3350 17 G PO PACK: 17.0000 g | PACK | Freq: Two times a day (BID) | ORAL | 0 refills | Status: AC

## 2016-08-25 MED ORDER — TAMSULOSIN HCL 0.4 MG PO CAPS: 0.4000 mg | ORAL_CAPSULE | Freq: Every day | ORAL | 0 refills | Status: AC

## 2016-08-25 MED ORDER — DABIGATRAN ETEXILATE MESYLATE 150 MG PO CAPS
150.0000 mg | ORAL_CAPSULE | Freq: Two times a day (BID) | ORAL | Status: DC
  Administered 2016-08-25: 150 mg via ORAL
  Filled 2016-08-25 (×3): qty 1

## 2016-08-25 NOTE — Discharge Summary (Signed)
Physician Discharge Summary  Caroline [name] [id number] DOB: [birth year] DOA: 08/23/2016  PCP: Glo Herring., MD  Admit date: 08/23/2016 Discharge date: 08/25/2016  Admitted From: Home.  Disposition: Home.   Recommendations for Outpatient Follow-up:  1. Follow up with PCP in 1-2 weeks 2. Follow up with Dr Oneida Alar as recommended.   Home Health: None.  Equipment/Devices:  Discharge Condition: No further rectal bleeding.  CODE STATUS: FULL CODE.  Diet recommendation: As tolerated.   Brief/Interim Summary: patient was admitted for acute urinary retention, constipation, and rectal bleeding by Dr Tennis Must on Aug 24, 2016.  As per his H and P :  " HPI: Caroline [name] is a 82 y.o. female with medical history significant of AAA, chronic atrial fibrillation on Pradaxa, CVA, TIA, DVT, GERD, hypertension, carotid artery stenosis (S/P CEA in 2013), peripheral arterial disease on Pletal, previous rectal bleeding who is coming to the emergency department around 2030 due to urinary retention since about 1500 today. The patient had over 400 mL of urine on bladder scan.   While in the emergency department, the patient had 2 episodes of hematochezia. She denies nausea, emesis, diarrhea or constipation. She denies dysuria or frequency and states that her suprapubic area pain has resolved after Foley cath was inserted. She denies fever, chills, chest pain, palpitations, worsening dyspnea, dizziness, diaphoresis, PND, orthopnea or pitting edema lower extremities.  ED Course: Foley catheter was inserted, urine was strained with intermediate improvement of her pain. Urine analysis showed hazy color, but was otherwise negative. WBC 9.6, hemoglobin 11.5 g/dL and platelets 211. PT 25.9 and INR 2.32. Sodium 133, potassium 3.6, chloride 92, bicarbonate 33 mmol/L. Stool guaiac was positive.  Imaging: CT scan of the abdomen/pelvis with contrast shows stable AAA, the stool and fluid filled colon with mild rectal  wall thickening. Possible ileus. Emphysematous changes with chronic interstitial lung disease or bronchopneumonia.  Review of Systems: As per HPI otherwise 10 point review of systems negative.   HOSPITAL COURSE:  Patient was admitted for urinary retention, perhaps precipitated by her constipation.  She was given a foley, and her UA showed no evidence of infection.  She has hx of spinal stenosis, but her lower extremities had no weakness, and it was not felt that she had urinary retention from her spinal cord.  She did not take any new anticholinergic medication nor taking any narcotics or decongestion.  For her GI bleeding, she was seen by GI, and it was felt that she has hemorrhoidal bleed. Her Pradaxa was held, and resumed.  She was given laxative regimen by Dr Dereck Leep.  Her Hb remained stable, and she will be discharged home today.  She will follow up with her PCP next week and with Dr Oneida Alar as scheduled.  For her urinary retention, will give voiding trial today.  If sucessful, she will be discharge without a foley. If unable, will discharge her with a foley and refer her to urology outpatient for follow up .  Thank you and good Day.   Discharge Diagnoses:  Principal Problem:   Hematochezia Active Problems:   Carotid stenosis   Peripheral vascular disease (HCC)   COPD (chronic obstructive pulmonary disease) (HCC)   Chronic atrial fibrillation (HCC)   Urinary retention   Anemia   Pressure injury of skin    Discharge Instructions  Discharge Instructions    Diet - low sodium heart healthy    Complete by:  As directed    Discharge instructions    Complete by:  As directed    Follow up with Dr Oneida Alar as recommended.   Increase activity slowly    Complete by:  As directed      Allergies as of 08/25/2016      Reactions   Ibuprofen    Gi upset   Pnu-imune [pneumococcal Polysaccharide Vaccine]    Huge knot at site and rash   Advair Diskus [fluticasone-salmeterol]    Confusion and  shortness of breath   Albuterol    Confusion and shortness of breath   Amoxicillin Hives   Cephalexin Hives   Codeine    confusion   Entex Pac [pseudoephedrine-gg & Dm] Other (See Comments)   Upper respiratory.    Etanercept    vertigo   Fentanyl    Hallucinations '   Gualenic Acid Other (See Comments)   unknown   Lorazepam    hallucinations   Nubain [nalbuphine Hcl]    Oxycodone Hcl Nausea And Vomiting   Oxycodone-acetaminophen Nausea And Vomiting   Roxicodone [oxycodone Hcl]    Sulfate    Tramadol    hallucinations   Tylox [oxycodone-acetaminophen]       Medication List    STOP taking these medications   hydrochlorothiazide 12.5 MG capsule Commonly known as:  MICROZIDE     TAKE these medications   cilostazol 100 MG tablet Commonly known as:  PLETAL Take 100 mg by mouth daily.   dabigatran 150 MG Caps capsule Commonly known as:  PRADAXA Take 150 mg by mouth 2 (two) times daily.   hydrocortisone 2.5 % rectal cream Commonly known as:  ANUSOL-HC Place rectally 2 (two) times daily.   levalbuterol 0.63 MG/3ML nebulizer solution Commonly known as:  XOPENEX Take 3 mLs (0.63 mg total) by nebulization every 4 (four) hours as needed for wheezing or shortness of breath.   OXYGEN Inhale 2 L into the lungs continuous.   polyethylene glycol packet Commonly known as:  MIRALAX / GLYCOLAX Take 17 g by mouth 2 (two) times daily.   potassium chloride SA 20 MEQ tablet Commonly known as:  K-DUR,KLOR-CON Take 20 mEq by mouth daily.   PRESERVISION AREDS 2+MULTI VIT Caps Take 1 capsule by mouth 2 (two) times daily.   senna-docusate 8.6-50 MG tablet Commonly known as:  Senokot-S Take 2 tablets by mouth at bedtime. For constipation What changed:  how much to take  when to take this  reasons to take this  additional instructions   SPIRIVA HANDIHALER 18 MCG inhalation capsule Generic drug:  tiotropium Place 18 mcg into inhaler and inhale at bedtime. Place 10 mcg  into inhaler and inhale at bedtime   tamsulosin 0.4 MG Caps capsule Commonly known as:  FLOMAX Take 1 capsule (0.4 mg total) by mouth daily. Start taking on:  08/26/2016   verapamil 240 MG 24 hr capsule Commonly known as:  VERELAN PM Take 240 mg by mouth daily.       Allergies  Allergen Reactions  . Ibuprofen     Gi upset  . Pnu-Imune [Pneumococcal Polysaccharide Vaccine]     Huge knot at site and rash  . Advair Diskus [Fluticasone-Salmeterol]     Confusion and shortness of breath  . Albuterol     Confusion and shortness of breath  . Amoxicillin Hives  . Cephalexin Hives  . Codeine     confusion  . Entex Pac [Pseudoephedrine-Gg & Dm] Other (See Comments)    Upper respiratory.   . Etanercept     vertigo  . Fentanyl  Hallucinations '  . Gualenic Acid Other (See Comments)    unknown  . Lorazepam     hallucinations  . Nubain [Nalbuphine Hcl]   . Oxycodone Hcl Nausea And Vomiting  . Oxycodone-Acetaminophen Nausea And Vomiting  . Roxicodone [Oxycodone Hcl]   . Sulfate   . Tramadol     hallucinations   . Tylox [Oxycodone-Acetaminophen]     Consultations:  GI.    Procedures/Studies: Ct Abdomen Pelvis W Contrast  Result Date: 08/24/2016 CLINICAL DATA:  Abdominal pain in urinary retention. Gradual onset of moderate constant suprapubic abdominal pain beginning at 3 p.m. today. Rectal and vaginal bleeding, rectal pain, urinary retention, hemorrhoids, chronic low back pain. EXAM: CT ABDOMEN AND PELVIS WITH CONTRAST TECHNIQUE: Multidetector CT imaging of the abdomen and pelvis was performed using the standard protocol following bolus administration of intravenous contrast. CONTRAST:  165mL ISOVUE-300 IOPAMIDOL (ISOVUE-300) INJECTION 61%, 64mL ISOVUE-300 IOPAMIDOL (ISOVUE-300) INJECTION 61% COMPARISON:  CT abdomen and pelvis 06/30/2014. CT lumbar spine 01/24/2016 FINDINGS: Lower chest: Emphysematous changes in the lung bases. Focal tree-in-bud opacities in the right lung  base posteriorly and in the periphery of the left lung. This could represent chronic interstitial change or acute bronchopneumonia. No pleural effusions. Small esophageal hiatal hernia. Hepatobiliary: Surgical absence of the gallbladder. Bile duct dilatation is likely normal for postcholecystectomy patient. No focal liver lesions. Pancreas: Unremarkable. No pancreatic ductal dilatation or surrounding inflammatory changes. Spleen: Normal in size without focal abnormality. Adrenals/Urinary Tract: No adrenal gland nodules. Bilateral renal cysts, largest in the upper pole right kidney. Nephrograms are symmetrical. No hydronephrosis or hydroureter. Bladder is decompressed with a Foley catheter. Stomach/Bowel: Stomach is unremarkable. Small bowel are mildly prominent and fluid-filled, likely representing ileus. Colon is diffusely fluid-filled with prominent stool in the rectum and rectosigmoid region, extending into the descending and transverse colon. There is mild wall thickening of the rectum. Changes could indicate stercoral colitis or possibly stool impaction. Appendix is not identified. Vascular/Lymphatic: There is an infrarenal abdominal aortic aneurysm, today measuring 4.1 cm AP dimension by 4 cm transversely. This represents no significant change since previous CT lumbar spine 01/24/2016. Circumferential calcification without much mural thrombus. Iliac arteries are normal caliber and appear patent with diffuse calcification. No evidence of retroperitoneal hematoma or fluid collection. No significant lymphadenopathy. Reproductive: Uterus is not visualized and may be surgically absent. No abnormal adnexal masses. Other: No free air or free fluid in the abdomen. Abdominal wall musculature appears intact although thinned. Musculoskeletal: Degenerative changes in the spine. Displaced fracture of the sacrum at the junction of S1-2. Fractures of the sacral ala bilaterally without displacement. These changes are present  on the previous CT lumbar spine from 01/24/2016. IMPRESSION: 1. 4.1 cm diameter abdominal aortic aneurysm, unchanged since previous lumbar spine CT of 01/24/2016. No changes to suggest rupture. 2. Stool and fluid filled the colon with prominent stool in the rectum. Mild rectal wall thickening may represent stercoral colitis or stool impaction. Mild prominence of fluid-filled small bowel is likely ileus. 3. Emphysematous changes in the lung bases with focal areas of tree-in-bud opacity possibly representing chronic interstitial change or acute bronchopneumonia. 4. Old sacral fractures. Electronically Signed   By: Lucienne Capers M.D.   On: 08/24/2016 02:09       Subjective: Feeling better.    Discharge Exam: Vitals:   08/24/16 2234 08/25/16 0500  BP:  (!) 117/59  Pulse: 68 76  Resp:  20  Temp:  97.7 F (36.5 C)   Vitals:   08/24/16 1327  08/24/16 2123 08/24/16 2234 08/25/16 0500  BP: (!) 123/59 (!) 103/56  (!) 117/59  Pulse: 76 83 68 76  Resp: 18 18  20   Temp: 98 F (36.7 C) 98.6 F (37 C)  97.7 F (36.5 C)  TempSrc: Oral Oral  Oral  SpO2: 98% 99% 96% 91%  Weight:      Height:        General: Pt is alert, awake, not in acute distress Cardiovascular: RRR, S1/S2 +, no rubs, no gallops Respiratory: CTA bilaterally, no wheezing, no rhonchi Abdominal: Soft, NT, ND, bowel sounds + Extremities: no edema, no cyanosis    The results of significant diagnostics from this hospitalization (including imaging, microbiology, ancillary and laboratory) are listed below for reference.     Basic Metabolic Panel:  Recent Labs Lab 08/23/16 2149 08/24/16 0720 08/24/16 1033  NA 133*  --  136  K 3.6  --  3.3*  CL 92*  --  97*  CO2 33*  --  31  GLUCOSE 97  --  85  BUN 14  --  10  CREATININE 0.46  --  0.48  CALCIUM 8.8*  --  8.2*  MG  --  1.6*  --   PHOS  --  3.5  --    Liver Function Tests:  Recent Labs Lab 08/23/16 2149  AST 18  ALT 14  ALKPHOS 89  BILITOT 0.6  PROT 6.2*   ALBUMIN 3.6    Recent Labs Lab 08/23/16 2149  LIPASE <10*   CBC:  Recent Labs Lab 08/23/16 2149 08/24/16 0720 08/24/16 1033 08/25/16 0533  WBC 9.6  --   --  5.8  NEUTROABS 7.1  --   --   --   HGB 11.5* 10.6* 10.8* 10.6*  HCT 34.7*  --   --  32.5*  MCV 78.9  --   --  79.9  PLT 211  --   --  186   Urinalysis    Component Value Date/Time   COLORURINE YELLOW 08/23/2016 2105   APPEARANCEUR HAZY (A) 08/23/2016 2105   LABSPEC 1.013 08/23/2016 2105   PHURINE 6.0 08/23/2016 2105   [place] 08/23/2016 2105   HGBUR NEGATIVE 08/23/2016 2105   BILIRUBINUR NEGATIVE 08/23/2016 2105   [place] NEGATIVE 08/23/2016 2105   PROTEINUR NEGATIVE 08/23/2016 2105   UROBILINOGEN 2.0 (H) 06/30/2014 1244   NITRITE NEGATIVE 08/23/2016 2105   LEUKOCYTESUR NEGATIVE 08/23/2016 2105    Time coordinating discharge: Over 30 minutes  SIGNED:   Orvan Falconer, MD FACP [facility] 08/25/2016, 12:45 PM   If 7PM-7AM, please contact night-coverage [email or web address] Password [email or web address]

## 2016-08-25 NOTE — Progress Notes (Signed)
  Subjective:  Patient continues to complain of active pain. She prefers to lie flat when she is in bed. She denies abdominal pain. She states she had large bowel movement last night was very painful. No melena or rectal bleeding reported.  Objective: Blood pressure (!) 117/59, pulse 76, temperature 97.7 F (36.5 C), temperature source Oral, resp. rate 20, height 5\' 4"  (1.626 m), weight 85 lb (38.6 kg), SpO2 91 %. Patient is alert and in no acute distress. Abdomen is flat soft and nontender without organomegaly or masses. No LE edema or clubbing noted.  Labs/studies Results:   Recent Labs  08/23/16 2149 08/24/16 0720 08/24/16 1033 08/25/16 0533  WBC 9.6  --   --  5.8  HGB 11.5* 10.6* 10.8* 10.6*  HCT 34.7*  --   --  32.5*  PLT 211  --   --  186    BMET   Recent Labs  08/23/16 2149 08/24/16 1033  NA 133* 136  K 3.6 3.3*  CL 92* 97*  CO2 33* 31  GLUCOSE 97 85  BUN 14 10  CREATININE 0.46 0.48  CALCIUM 8.8* 8.2*    LFT   Recent Labs  08/23/16 2149  PROT 6.2*  ALBUMIN 3.6  AST 18  ALT 14  ALKPHOS 89  BILITOT 0.6    PT/INR   Recent Labs  08/23/16 2149  LABPROT 25.9*  INR 2.32     Assessment:  #1.Hematochezia secondary to hemorrhoids and proctitis due to fecal impaction. Hemoglobin is low but stable. #2. Fecal impaction. Fecal impaction appears to have resolved. She has markedly dilated rectum consistent with chronic constipation and she needs to be on laxative daily. #3. History of multiple colonic adenomas. Last colonoscopy was in March 2016 at [facility]. Dr. Oneida Alar will determine the timing of next colonoscopy. #4. Acute urinary retention possibly due to constipation. Dr. Marin Comment considering removing Foley catheter and see how she does.  Recommendations:  Patient advised to consume fiber rich foods. Continue polyethylene glycol daily. She can titrate the dose. Can resume Pradaxa. Continue Anusol HC cream for 2 weeks.

## 2016-08-25 NOTE — Progress Notes (Signed)
Patient is to be discharged home and in stable condition. Patient and family verbalize understanding of medication regimen and discharge instructions. Patient will be escorted out by staff.  Celestia Khat, RN

## 2016-09-10 DIAGNOSIS — Z0000 Encounter for general adult medical examination without abnormal findings: Secondary | ICD-10-CM | POA: Diagnosis not present

## 2016-09-10 DIAGNOSIS — Z681 Body mass index (BMI) 19 or less, adult: Secondary | ICD-10-CM | POA: Diagnosis not present

## 2016-09-10 DIAGNOSIS — J449 Chronic obstructive pulmonary disease, unspecified: Secondary | ICD-10-CM | POA: Diagnosis not present

## 2016-09-16 ENCOUNTER — Encounter: Payer: Self-pay | Admitting: Family

## 2016-09-25 ENCOUNTER — Emergency Department (HOSPITAL_COMMUNITY)
Admission: EM | Admit: 2016-09-25 | Discharge: 2016-09-26 | Disposition: A | Discharge status: Home / Self Care | Payer: Medicare Other | Attending: Emergency Medicine | Admitting: Emergency Medicine

## 2016-09-25 ENCOUNTER — Encounter (HOSPITAL_COMMUNITY): Payer: Self-pay | Admitting: Emergency Medicine

## 2016-09-25 DIAGNOSIS — I10 Essential (primary) hypertension: Secondary | ICD-10-CM | POA: Insufficient documentation

## 2016-09-25 DIAGNOSIS — Z87891 Personal history of nicotine dependence: Secondary | ICD-10-CM | POA: Diagnosis not present

## 2016-09-25 DIAGNOSIS — L723 Sebaceous cyst: Secondary | ICD-10-CM | POA: Diagnosis not present

## 2016-09-25 DIAGNOSIS — R000 Tachycardia, unspecified: Secondary | ICD-10-CM | POA: Diagnosis not present

## 2016-09-25 DIAGNOSIS — Z79899 Other long term (current) drug therapy: Secondary | ICD-10-CM | POA: Insufficient documentation

## 2016-09-25 DIAGNOSIS — R102 Pelvic and perineal pain: Secondary | ICD-10-CM | POA: Diagnosis present

## 2016-09-25 DIAGNOSIS — N907 Vulvar cyst: Secondary | ICD-10-CM | POA: Insufficient documentation

## 2016-09-25 DIAGNOSIS — J449 Chronic obstructive pulmonary disease, unspecified: Secondary | ICD-10-CM | POA: Diagnosis not present

## 2016-09-25 NOTE — ED Triage Notes (Signed)
Pt c/o rectal pain after bowel movement. Pt states the area is swollen and bleeding.

## 2016-09-25 NOTE — ED Provider Notes (Signed)
[facility] DEPT Provider Note   CSN: [id number] Arrival date & time: 09/25/16  2112     History   Chief Complaint Chief Complaint  Patient presents with  . Rectal Pain    HPI Caroline [name] is a 82 y.o. female presenting for evaluation of a painful swollen mass near her vagina which developed suddenly this evening during a bowel movement, but has drained since arriving here.  She and daughter at bedside reports she has had problems with cysts along her outer labia's which generally will resolve using warm compresses.  She denies any swelling or pain prior to development of this mass tonight however it is improved now that it drained (clear fluid and white thick discharge).  She denies fevers, chills or other complaint.   The history is provided by the patient and a relative.    Past Medical History:  Diagnosis Date  . AAA (abdominal aortic aneurysm) without rupture ([place]) 01/24/2016   4.3x4.0 cm per CT   . Atrial fibrillation ([place])   . Cerebrovascular disease   . COPD (chronic obstructive pulmonary disease) ([place])   . CVA 10/25/2008   Qualifier: Diagnosis of  By: [name], CNA, Christy    . DVT (deep venous thrombosis) ([place])   . GERD (gastroesophageal reflux disease)   . Hypertension   . Internal carotid artery stenosis 05/13/2012   HIGH GRADE LEFT INTERNAL CAROTID STENOSIS.  S/p  L CEA 05/11/12  . Irregular heart beat   . PAD (peripheral artery disease) (H[facility])    lower extremity PAD with bilateral leg stenting  . TIA (transient ischemic attack)     Patient Active Problem List   Diagnosis Date Noted  . Hematochezia 08/24/2016  . Anemia 08/24/2016  . Pressure injury of skin 08/24/2016  . CAP (community acquired pneumonia) 02/06/2016  . Fall   . Malnutrition of moderate degree 01/25/2016  . Hyperkalemia 01/25/2016  . Urinary retention 01/25/2016  . Inability to ambulate due to multiple joints 01/24/2016  . Facet arthropathy of spine 01/24/2016  . Chronic respiratory  failure with hypoxia ([place]) 01/24/2016  . Low back pain at multiple sites 01/24/2016  . Chronic atrial fibrillation ([place]) 01/24/2016  . AAA (abdominal aortic aneurysm) without rupture ([place]) 01/24/2016  . PVD (peripheral vascular disease) ([place]) 01/24/2016  . Constipation 01/24/2016  . Colon adenomas 11/28/2014  . Rectal bleeding 06/30/2014  . Acute respiratory failure with hypoxia ([place]) 04/14/2014  . Pulmonary edema 04/14/2014  . Protein-calorie malnutrition, severe ([place]) 04/14/2014  . Hypotension 05/13/2012  . Internal carotid artery stenosis 05/13/2012  . TIA (transient ischemic attack) 05/05/2012  . Hemiballismus 05/03/2012  . Stroke ([place]) 05/02/2012  . COPD (chronic obstructive pulmonary disease) ([place]) 05/02/2012  . Peripheral vascular disease ([place]) 05/17/2010  . TOBACCO ABUSE 05/16/2010  . Carotid stenosis 11/06/2009  . FIBRILLATION, ATRIAL 10/25/2008  . CVA 10/25/2008  . LOW BACK PAIN, CHRONIC 10/25/2008    Past Surgical History:  Procedure Laterality Date  . APPENDECTOMY  1956  . CAROTID ANGIOGRAM N/A 05/06/2012   Procedure: CAROTID ANGIOGRAM;  Surgeon: [name], MD;  Location[facility] C[facility] LAB;  Service: Cardiovascular;  Laterality: N/A;  . CAROTID ENDARTERECTOMY Left Nov. 4, 2013   CE  . CHOLECYSTECTOMY  1966   Gall Bladder  . COLONOSCOPY N/A 07/12/2014   UVO:ZDGUY cecal polyp/one colon polyp/redundant left colon  . ENDARTERECTOMY  05/11/2012   Procedure: ENDARTERECTOMY CAROTID;  Surgeon: [name], MD;  Location: [facility];  Service: Vascular;  Laterality: Left;  .  ESOPHAGOGASTRODUODENOSCOPY N/A 07/12/2014   TIR:[id number] in the lower thrid of the esophagus  . EYE SURGERY Left Dec. 22, 2016   Cataract  . GALLBLADDER SURGERY    . MALONEY DILATION N/A 07/12/2014   Procedure: MALONEY DILATION;  Surgeon: [name], MD;  [facility];  Service: Endoscopy;  Laterality: N/A;  . PARTIAL HYSTERECTOMY    . PATCH ANGIOPLASTY  05/11/2012   Procedure: PATCH  ANGIOPLASTY;  Surgeon: [name], MD;  Location: [facility] OR;  Service: Vascular;  Laterality: Left;  with Dacron Patch Angioplasty  . SAVORY DILATION N/A 07/12/2014   Procedure: SAVORY DILATION;  Surgeon: [name], MD;  Location: [facility] ENDO SUITE;  Service: Endoscopy;  Laterality: N/A;    OB History    No data available       Home Medications    Prior to Admission medications   Medication Sig Start Date End Date Taking? Authorizing Provider  cilostazol (PLETAL) 100 MG tablet Take 100 mg by mouth daily.     Historical Provider, MD  dabigatran (PRADAXA) 150 MG CAPS capsule Take 150 mg by mouth 2 (two) times daily.    Historical Provider, MD  hydrocortisone (ANUSOL-HC) 2.5 % rectal cream Place rectally 2 (two) times daily. 08/25/16   Orvan Falconer, MD  levalbuterol Penne Lash) 0.63 MG/3ML nebulizer solution Take 3 mLs (0.63 mg total) by nebulization every 4 (four) hours as needed for wheezing or shortness of breath. 01/27/16   Rexene Alberts, MD  Multiple Vitamins-Minerals (PRESERVISION AREDS 2+MULTI VIT) CAPS Take 1 capsule by mouth 2 (two) times daily.    Historical Provider, MD  OXYGEN Inhale 2 L into the lungs continuous.     Historical Provider, MD  polyethylene glycol (MIRALAX / GLYCOLAX) packet Take 17 g by mouth 2 (two) times daily. 08/25/16   Orvan Falconer, MD  potassium chloride SA (K-DUR,KLOR-CON) 20 MEQ tablet Take 20 mEq by mouth daily.    Historical Provider, MD  senna-docusate (SENOKOT-S) 8.6-50 MG tablet Take 2 tablets by mouth at bedtime. For constipation Patient taking differently: Take 1-2 tablets by mouth at bedtime as needed for mild constipation. For constipation 01/27/16   Rexene Alberts, MD  tamsulosin (FLOMAX) 0.4 MG CAPS capsule Take 1 capsule (0.4 mg total) by mouth daily. 08/26/16   Orvan Falconer, MD  tiotropium (SPIRIVA HANDIHALER) 18 MCG inhalation capsule Place 18 mcg into inhaler and inhale at bedtime. Place 10 mcg into inhaler and inhale at bedtime     Historical Provider, MD    verapamil (VERELAN PM) 240 MG 24 hr capsule Take 240 mg by mouth daily.    Historical Provider, MD    Family History Family History  Problem Relation Age of Onset  . Deep vein thrombosis Sister     Blood Clot in leg  . Alzheimer's disease Brother   . COPD Sister   . COPD Sister     Social History Social History  Substance Use Topics  . Smoking status: Former Smoker    Packs/day: 1.00    Quit date: 02/23/2014  . Smokeless tobacco: Former Systems developer  . Alcohol use No     Allergies   Ibuprofen; Pnu-imune [pneumococcal polysaccharide vaccine]; Advair diskus [fluticasone-salmeterol]; Albuterol; Amoxicillin; Cephalexin; Codeine; Entex pac [pseudoephedrine-gg & dm]; Etanercept; Fentanyl; Gualenic acid; Lorazepam; Nubain [nalbuphine hcl]; Oxycodone hcl; Oxycodone-acetaminophen; Roxicodone [oxycodone hcl]; Sulfate; Tramadol; and Tylox [oxycodone-acetaminophen]   Review of Systems Review of Systems  Constitutional: Negative for chills and fever.  HENT: Negative.   Respiratory: Negative for  shortness of breath and wheezing.   Cardiovascular: Negative.   Gastrointestinal: Negative.   Genitourinary: Negative.   Skin:       Negative except as mentioned in HPI.   Neurological: Negative for numbness.     Physical Exam Updated Vital Signs BP 131/69 (BP Location: Right Arm)   Pulse (!) 104   Temp 98.3 F (36.8 C) (Oral)   Resp 17   Ht 5\' 6"  (1.676 m)   Wt 40.4 kg   SpO2 97%   BMI 14.36 kg/m   Physical Exam  Constitutional: She appears well-developed and well-nourished. No distress.  HENT:  Head: Normocephalic.  Cardiovascular: Normal rate.   Pulmonary/Chest: Effort normal. She has no wheezes.  Musculoskeletal: Normal range of motion. She exhibits no edema.  Skin:  Multiple small sebaceous cysts/ along groin and bilateral labia majora.  Non tender, induration slightly erythematous lesion right labia which has recently drained.  1 cm intact cyst left labia with smaller multiple  cysts.  No other infected appearing cysts.      ED Treatments / Results  Labs (all labs ordered are listed, but only abnormal results are displayed) Labs Reviewed - No data to display  EKG  EKG Interpretation None       Radiology No results found.  Procedures Procedures (including critical care time)  INCISION AND DRAINAGE Performed by: Evalee Jefferson Consent: Verbal consent obtained. Risks and benefits: risks, benefits and alternatives were discussed Type: sebaceous cyst  Body area: left labia majora  Anesthesia: freeze spray, betadine used for sterile procedure.  Incision was made with a scalpel.  Local anesthetic: none  Anesthetic total: na Complexity: complex Blunt dissection to break up loculations  Drainage: sebum Drainage amount: moderate Packing material: na  Patient tolerance: Patient tolerated the procedure well with no immediate complications.     Medications Ordered in ED Medications - No data to display   Initial Impression / Assessment and Plan / ED Course  I have reviewed the triage vital signs and the nursing notes.  Pertinent labs & imaging results that were available during my care of the patient were reviewed by me and considered in my medical decision making (see chart for details).     Warm compresses, plan f/u with pcp if sx persist or worsen.  The patient appears reasonably screened and/or stabilized for discharge and I doubt any other medical condition or other [facility] requiring further screening, evaluation, or treatment in the ED at this time prior to discharge.  Pt was seen by Dr. Lacinda Axon during this visit.  Final Clinical Impressions(s) / ED Diagnoses   Final diagnoses:  Sebaceous cyst of labia    New Prescriptions Discharge Medication List as of 09/25/2016 11:44 PM       Evalee Jefferson, PA-C 09/26/16 0036    Nat Christen, MD 09/28/16 1655

## 2016-09-27 ENCOUNTER — Ambulatory Visit (INDEPENDENT_AMBULATORY_CARE_PROVIDER_SITE_OTHER)
Admission: RE | Admit: 2016-09-27 | Discharge: 2016-09-27 | Disposition: A | Discharge status: Home / Self Care | Payer: Medicare Other | Source: Ambulatory Visit | Attending: Family | Admitting: Family

## 2016-09-27 ENCOUNTER — Ambulatory Visit (HOSPITAL_COMMUNITY)
Admission: RE | Admit: 2016-09-27 | Discharge: 2016-09-27 | Disposition: A | Discharge status: Home / Self Care | Payer: Medicare Other | Source: Ambulatory Visit | Attending: Family | Admitting: Family

## 2016-09-27 ENCOUNTER — Encounter: Payer: Self-pay | Admitting: Family

## 2016-09-27 ENCOUNTER — Ambulatory Visit (INDEPENDENT_AMBULATORY_CARE_PROVIDER_SITE_OTHER): Payer: Medicare Other | Admitting: Family

## 2016-09-27 VITALS — BP 121/75 | HR 103 | Temp 97.5°F | Resp 18 | Ht 66.0 in | Wt 89.3 lb

## 2016-09-27 DIAGNOSIS — Z7901 Long term (current) use of anticoagulants: Secondary | ICD-10-CM | POA: Diagnosis not present

## 2016-09-27 DIAGNOSIS — I6521 Occlusion and stenosis of right carotid artery: Secondary | ICD-10-CM

## 2016-09-27 DIAGNOSIS — I779 Disorder of arteries and arterioles, unspecified: Secondary | ICD-10-CM

## 2016-09-27 DIAGNOSIS — Z48812 Encounter for surgical aftercare following surgery on the circulatory system: Secondary | ICD-10-CM | POA: Diagnosis not present

## 2016-09-27 DIAGNOSIS — R0989 Other specified symptoms and signs involving the circulatory and respiratory systems: Secondary | ICD-10-CM | POA: Diagnosis not present

## 2016-09-27 DIAGNOSIS — Z9889 Other specified postprocedural states: Secondary | ICD-10-CM

## 2016-09-27 DIAGNOSIS — Z87891 Personal history of nicotine dependence: Secondary | ICD-10-CM | POA: Diagnosis not present

## 2016-09-27 LAB — VAS US CAROTID
LCCAPSYS: 73 cm/s
LEFT ECA DIAS: -10 cm/s
LICADSYS: -55 cm/s
LICAPDIAS: -22 cm/s
LICAPSYS: -63 cm/s
Left CCA dist dias: 19 cm/s
Left CCA dist sys: 72 cm/s
Left CCA prox dias: 21 cm/s
Left ICA dist dias: -16 cm/s
RIGHT CCA MID DIAS: 16 cm/s
RIGHT ECA DIAS: -11 cm/s
Right CCA prox dias: 15 cm/s
Right CCA prox sys: 64 cm/s
Right cca dist sys: -66 cm/s

## 2016-09-27 NOTE — Progress Notes (Signed)
Chief Complaint: Follow up Extracranial Carotid Artery Stenosis   History of Present Illness  Caroline [name] is a 82 y.o. female patient of Dr. Oneida Alar who is status post left carotid endarterectomy in November, 2013.  She had a preoperative stroke. She has mild residual loss of dexterity in her right hand. She had no monocular loss of vision, no speech difficulties. She not had any subsequent stroke or TIA.  She returns today for carotid artery, PAD surveillance, and follow up.  She denies claudication symptoms with walking, denies non-healing wounds. But she does not seem to walk enough to elicit claudication symptoms, seems limited by dyspnea.  Pt states she had an arterial stent placed in her left thigh region about 2008 or before, by Dr. Rollene Fare, according to pt's daughter.   Pt states she has lumbar spinal stenosis, states "my back gives out occasionally", and attributes her left leg weakness to this also.   She denies light-headedness or dizziness unless she stands up too fast.   Her husband has severe health issues which is a stressor for her. 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 Is using 2L/[place] supplemental oxygen 24/7.  Patient reports New Medical or Surgical History: she fell in July 2017, no fracture sustained, had in facility rehab after this.   She has much less coughing since she changed from cigarettes to vapor nicotine She lost 7 pounds in a year, but states her appetite is voracious, eats all day, denies post prandial abdominal pain.    Pt Diabetic: yes, "borderline" Pt smoker: former smoker x 60 years (stopped August 2015, is using vapor cigs with nicotine, is weaning down)  Pt meds include: Statin : No ASA: No Other anticoagulants/antiplatelets: Pradaxa prescribed by her cardiologist for atrial fibrillation. Taking Pletal prescribed by Dr. Percival Spanish.     Past Medical History:  Diagnosis Date  . AAA (abdominal aortic aneurysm) without rupture ([place]) 01/24/2016   4.3x4.0 cm per CT   . Atrial fibrillation ([place])   . Cerebrovascular disease   . COPD (chronic obstructive pulmonary disease) ([place])   . CVA 10/25/2008   Qualifier: Diagnosis of  By: [name], CNA, Christy    . DVT (deep venous thrombosis) ([place])   . GERD (gastroesophageal reflux disease)   . Hypertension   . Internal carotid artery stenosis 05/13/2012   HIGH GRADE LEFT INTERNAL CAROTID STENOSIS.  S/p  L CEA 05/11/12  . Irregular heart beat   . PAD (peripheral artery disease) (HCC)    lower extremity PAD with bilateral leg stenting  . TIA (transient ischemic attack)     Social History Social History  Substance Use Topics  . Smoking status: Former Smoker    Packs/day: 1.00    Quit date: 02/23/2014  . Smokeless tobacco: Former Systems developer  . Alcohol use No    Family History Family History  Problem Relation Age of Onset  . Deep vein thrombosis Sister     Blood Clot in leg  . Alzheimer's disease Brother   . COPD Sister   . COPD Sister     Surgical History Past Surgical History:  Procedure Laterality Date  . APPENDECTOMY  1956  . CAROTID ANGIOGRAM N/A 05/06/2012   Procedure: CAROTID ANGIOGRAM;  Surgeon: [name], MD;  Location[facility] CATH LAB;  Service: Cardiovascular;  Laterality: N/A;  . CAROTID ENDARTERECTOMY Left Nov. 4, 2013   CE  . CHOLECYSTECTOMY  1966   Gall Bladder  . COLONOSCOPY N/A 07/12/2014   DBZ:MCEYE cecal polyp/one colon polyp/redundant left colon  . ENDARTERECTOMY  05/11/2012   Procedure: ENDARTERECTOMY CAROTID;  Surgeon: [name], MD;  Location: [facility] OR;  Service: Vascular;  Laterality: Left;  . ESOPHAGOGASTRODUODENOSCOPY N/A 07/12/2014   MVV:[id number] in the lower thrid of the esophagus  . EYE SURGERY Left Dec. 22, 2016   Cataract  . GALLBLADDER SURGERY    . MALONEY DILATION N/A 07/12/2014   Procedure: MALONEY DILATION;  Surgeon: [name],  MD;  [facility];  Service: Endoscopy;  Laterality: N/A;  . PARTIAL HYSTERECTOMY    . PATCH ANGIOPLASTY  05/11/2012   Procedure: PATCH ANGIOPLASTY;  Surgeon: [name], MD;  Location: [facility] OR;  Service: Vascular;  Laterality: Left;  with Dacron Patch Angioplasty  . SAVORY DILATION N/A 07/12/2014   Procedure: SAVORY DILATION;  Surgeon: [name], MD;  [facility];  Service: Endoscopy;  Laterality: N/A;    Allergies  Allergen Reactions  . Ibuprofen     Gi upset  . Pnu-Imune [Pneumococcal Polysaccharide Vaccine]     Huge knot at site and rash  . Advair Diskus [Fluticasone-Salmeterol]     Confusion and shortness of breath  . Albuterol     Confusion and shortness of breath  . Amoxicillin Hives  . Cephalexin Hives  . Codeine     confusion  . Entex Pac [Pseudoephedrine-Gg & Dm] Other (See Comments)    Upper respiratory.   . Etanercept     vertigo  . Fentanyl     Hallucinations '  . Gualenic Acid Other (See Comments)    unknown  . Lorazepam     hallucinations  . Nubain [Nalbuphine Hcl]   . Oxycodone Hcl Nausea And Vomiting  . Oxycodone-Acetaminophen Nausea And Vomiting  . Roxicodone [Oxycodone Hcl]   . Sulfate   . Tramadol     hallucinations   . Tylox [Oxycodone-Acetaminophen]     Current Outpatient Prescriptions  Medication Sig Dispense Refill  . cilostazol (PLETAL) 100 MG tablet Take 100 mg by mouth daily.     . dabigatran (PRADAXA) 150 MG CAPS capsule Take 150 mg by mouth 2 (two) times daily.    . hydrocortisone (ANUSOL-HC) 2.5 % rectal cream Place rectally 2 (two) times daily. 30 g 0  . Multiple Vitamins-Minerals (PRESERVISION AREDS 2+MULTI VIT) CAPS Take 1 capsule by mouth 2 (two) times daily.    . OXYGEN Inhale 2 L into the lungs continuous.     . potassium chloride SA (K-DUR,KLOR-CON) 20 MEQ tablet Take 20 mEq by mouth daily.    Marland Kitchen tiotropium (SPIRIVA HANDIHALER) 18 MCG inhalation capsule Place 18 mcg into inhaler and inhale at bedtime.  Place 10 mcg into inhaler and inhale at bedtime     . verapamil (VERELAN PM) 240 MG 24 hr capsule Take 240 mg by mouth daily.    Marland Kitchen levalbuterol (XOPENEX) 0.63 MG/3ML nebulizer solution Take 3 mLs (0.63 mg total) by nebulization every 4 (four) hours as needed for wheezing or shortness of breath. (Patient not taking: Reported on 09/27/2016)    . polyethylene glycol (MIRALAX / GLYCOLAX) packet Take 17 g by mouth 2 (two) times daily. (Patient not taking: Reported on 09/27/2016) 14 each 0  . senna-docusate (SENOKOT-S) 8.6-50 MG tablet Take 2 tablets by mouth at bedtime. For constipation (Patient not taking: Reported on 09/27/2016)    . tamsulosin (FLOMAX) 0.4 MG CAPS capsule Take 1 capsule (  0.4 mg total) by mouth daily. (Patient not taking: Reported on 09/27/2016) 30 capsule 0   No current facility-administered medications for this visit.     Review of Systems : See HPI for pertinent positives and negatives.  Physical Examination  Vitals:   09/27/16 1441  BP: 121/75  Pulse: (!) 103  Resp: 18  Temp: 97.5 F (36.4 C)  TempSrc: Oral  Weight: 89 lb 4.8 oz (40.5 kg)  Height: 5\' 6"  (1.676 m)   Body mass index is 14.41 kg/m.  General: WDWN thin female in NAD. GAIT: slow, steady Eyes: PERRLA Pulmonary: Minimal air movement in all fields, + rales in bases, no rhonchi auscultated, + moist cough. Wearing [place], portable supplemental oxygen  Cardiac: Irregular rhythm, no detected murmur.  VASCULAR EXAM Carotid Bruits Left Right   Negative Negative   Aorta is palpable. Radial pulses are 2+ palpable and equal.      LE Pulses LEFT RIGHT       FEMORAL 2+ palpable  1+ palpable   POPLITEAL  not palpable  not palpable   POSTERIOR TIBIAL not palpable, + Doppler signal  not palpable, + Doppler signal    DORSALIS PEDIS   ANTERIOR TIBIAL not palpable, + Doppler signal  not palpable, + Doppler signal     Gastrointestinal: soft, nontender, BS WNL, no r/g, no palpable masses.  Musculoskeletal: + muscle atrophy/wasting, cachectic. M/S 4/5 throughout except 3/5 in right LE, Extremities without ischemic changes. Moderate kyphosis.   Neurologic: A&O X 3; Appropriate Affect ; SENSATION ;normal;  Speech is normal CN 2-12 intact, Pain and light touch intact in extremities, Motor exam as listed above      Assessment: TINSLEIGH SLOVACEK is a 82 y.o. female who is status post left carotid endarterectomy in November, 2013.  She had a preoperative stroke. She has mild residual loss of dexterity in her right hand. She had no monocular loss of vision, no speech difficulties. She not had any subsequent stroke or TIA.   Dr. Warren Lacy has been monitoring patient's small AAA.   She reports a remote history of left upper leg arterial stent placement, she has no claudication symptoms, no signs of ischemia in her feet or legs. I am unable to palpate pedal pulses but Doppler signals are present in both DP and PT arteries. She is taking Pletal prescribed by her cardiologist.   DATA (09/27/16):  Carotid duplex suggests left carotid endarterectomy site with no left internal carotid artery stenosis, and 1-39% stenosis of the right proximal internal carotid artery. Bilateral vertebral artery flow is antegrade.  Bilateral subclavian artery waveforms are normal.  No significant change noted when compared to the previous exam on 08-03-15.  ABI: Right: 0.88; waveforms: biphasic; TBI: absent waveform Left: 0.66, waveforms: PT: monophasic, DP: biphasic; TBI: absent waveform Mild arterial occlusive disease in the right LE, moderate in the left.  No previous ABI from this facility for comparison.     Plan:  Follow-up in 1 year with Carotid Duplex scan and bilateral ABI.   I discussed in depth with the patient the  nature of atherosclerosis, and emphasized the importance of maximal medical management including strict control of blood pressure, blood glucose, and lipid levels, obtaining regular exercise, and continued cessation of smoking.  The patient is aware that without maximal medical management the underlying atherosclerotic disease process will progress, limiting the benefit of any interventions. The patient was given information about stroke prevention and what symptoms should prompt the patient to seek immediate medical  care. Thank you for allowing [place] to participate in this patient's care.  Clemon Chambers, RN, MSN, FNP-C [facility] Office: [phone]  Clinic Physician: Bridgett Larsson  09/27/16 2:53 PM

## 2016-09-27 NOTE — Patient Instructions (Addendum)
Stroke Prevention Some medical conditions and behaviors are associated with an increased chance of having a stroke. You may prevent a stroke by making healthy choices and managing medical conditions. How can I reduce my risk of having a stroke?  Stay physically active. Get at least 30 minutes of activity on most or all days.  Do not smoke. It may also be helpful to avoid exposure to secondhand smoke.  Limit alcohol use. Moderate alcohol use is considered to be:  No more than 2 drinks per day for men.  No more than 1 drink per day for nonpregnant women.  Eat healthy foods. This involves:  Eating 5 or more servings of fruits and vegetables a day.  Making dietary changes that address high blood pressure (hypertension), high cholesterol, diabetes, or obesity.  Manage your cholesterol levels.  Making food choices that are high in fiber and low in saturated fat, trans fat, and cholesterol may control cholesterol levels.  Take any prescribed medicines to control cholesterol as directed by your health care provider.  Manage your diabetes.  Controlling your carbohydrate and sugar intake is recommended to manage diabetes.  Take any prescribed medicines to control diabetes as directed by your health care provider.  Control your hypertension.  Making food choices that are low in salt (sodium), saturated fat, trans fat, and cholesterol is recommended to manage hypertension.  Ask your health care provider if you need treatment to lower your blood pressure. Take any prescribed medicines to control hypertension as directed by your health care provider.  If you are 18-39 years of age, have your blood pressure checked every 3-5 years. If you are 40 years of age or older, have your blood pressure checked every year.  Maintain a healthy weight.  Reducing calorie intake and making food choices that are low in sodium, saturated fat, trans fat, and cholesterol are recommended to manage  weight.  Stop drug abuse.  Avoid taking birth control pills.  Talk to your health care provider about the risks of taking birth control pills if you are over 35 years old, smoke, get migraines, or have ever had a blood clot.  Get evaluated for sleep disorders (sleep apnea).  Talk to your health care provider about getting a sleep evaluation if you snore a lot or have excessive sleepiness.  Take medicines only as directed by your health care provider.  For some people, aspirin or blood thinners (anticoagulants) are helpful in reducing the risk of forming abnormal blood clots that can lead to stroke. If you have the irregular heart rhythm of atrial fibrillation, you should be on a blood thinner unless there is a good reason you cannot take them.  Understand all your medicine instructions.  Make sure that other conditions (such as anemia or atherosclerosis) are addressed. Get help right away if:  You have sudden weakness or numbness of the face, arm, or leg, especially on one side of the body.  Your face or eyelid droops to one side.  You have sudden confusion.  You have trouble speaking (aphasia) or understanding.  You have sudden trouble seeing in one or both eyes.  You have sudden trouble walking.  You have dizziness.  You have a loss of balance or coordination.  You have a sudden, severe headache with no known cause.  You have new chest pain or an irregular heartbeat. Any of these symptoms may represent a serious problem that is an emergency. Do not wait to see if the symptoms will go away.   Get medical help at once. Call your local emergency services (911 in [place] Do not drive yourself to the hospital. This information is not intended to replace advice given to you by your health care provider. Make sure you discuss any questions you have with your health care provider. Document Released: 08/01/2004 Document Revised: 11/30/2015 Document Reviewed: 12/25/2012 Elsevier  Interactive Patient Education  2017 [employer].    Stroke Prevention Some medical conditions and behaviors are associated with an increased chance of having a stroke. You may prevent a stroke by making healthy choices and managing medical conditions. How can I reduce my risk of having a stroke?  Stay physically active. Get at least 30 minutes of activity on most or all days.  Do not smoke. It may also be helpful to avoid exposure to secondhand smoke.  Limit alcohol use. Moderate alcohol use is considered to be:  No more than 2 drinks per day for men.  No more than 1 drink per day for nonpregnant women.  Eat healthy foods. This involves:  Eating 5 or more servings of fruits and vegetables a day.  Making dietary changes that address high blood pressure (hypertension), high cholesterol, diabetes, or obesity.  Manage your cholesterol levels.  Making food choices that are high in fiber and low in saturated fat, trans fat, and cholesterol may control cholesterol levels.  Take any prescribed medicines to control cholesterol as directed by your health care provider.  Manage your diabetes.  Controlling your carbohydrate and sugar intake is recommended to manage diabetes.  Take any prescribed medicines to control diabetes as directed by your health care provider.  Control your hypertension.  Making food choices that are low in salt (sodium), saturated fat, trans fat, and cholesterol is recommended to manage hypertension.  Ask your health care provider if you need treatment to lower your blood pressure. Take any prescribed medicines to control hypertension as directed by your health care provider.  If you are 47-68 years of age, have your blood pressure checked every 3-5 years. If you are 8 years of age or older, have your blood pressure checked every year.  Maintain a healthy weight.  Reducing calorie intake and making food choices that are low in sodium, saturated fat, trans  fat, and cholesterol are recommended to manage weight.  Stop drug abuse.  Avoid taking birth control pills.  Talk to your health care provider about the risks of taking birth control pills if you are over 37 years old, smoke, get migraines, or have ever had a blood clot.  Get evaluated for sleep disorders (sleep apnea).  Talk to your health care provider about getting a sleep evaluation if you snore a lot or have excessive sleepiness.  Take medicines only as directed by your health care provider.  For some people, aspirin or blood thinners (anticoagulants) are helpful in reducing the risk of forming abnormal blood clots that can lead to stroke. If you have the irregular heart rhythm of atrial fibrillation, you should be on a blood thinner unless there is a good reason you cannot take them.  Understand all your medicine instructions.  Make sure that other conditions (such as anemia or atherosclerosis) are addressed. Get help right away if:  You have sudden weakness or numbness of the face, arm, or leg, especially on one side of the body.  Your face or eyelid droops to one side.  You have sudden confusion.  You have trouble speaking (aphasia) or understanding.  You have sudden trouble  seeing in one or both eyes.  You have sudden trouble walking.  You have dizziness.  You have a loss of balance or coordination.  You have a sudden, severe headache with no known cause.  You have new chest pain or an irregular heartbeat. Any of these symptoms may represent a serious problem that is an emergency. Do not wait to see if the symptoms will go away. Get medical help at once. Call your local emergency services (911 in [place] Do not drive yourself to the hospital. This information is not intended to replace advice given to you by your health care provider. Make sure you discuss any questions you have with your health care provider. Document Released: 08/01/2004 Document Revised:  11/30/2015 Document Reviewed: 12/25/2012 Elsevier Interactive Patient Education  2017 [employer].     Peripheral Vascular Disease Peripheral vascular disease (PVD) is a disease of the blood vessels that are not part of your heart and brain. A simple term for PVD is poor circulation. In most cases, PVD narrows the blood vessels that carry blood from your heart to the rest of your body. This can result in a decreased supply of blood to your arms, legs, and internal organs, like your stomach or kidneys. However, it most often affects a person's lower legs and feet. There are two types of PVD.  Organic PVD. This is the more common type. It is caused by damage to the structure of blood vessels.  Functional PVD. This is caused by conditions that make blood vessels contract and tighten (spasm). Without treatment, PVD tends to get worse over time. PVD can also lead to acute ischemic limb. This is when an arm or limb suddenly has trouble getting enough blood. This is a medical emergency. Follow these instructions at home:  Take medicines only as told by your doctor.  Do not use any tobacco products, including cigarettes, chewing tobacco, or electronic cigarettes. If you need help quitting, ask your doctor.  Lose weight if you are overweight, and maintain a healthy weight as told by your doctor.  Eat a diet that is low in fat and cholesterol. If you need help, ask your doctor.  Exercise regularly. Ask your doctor for some good activities for you.  Take good care of your feet.  Wear comfortable shoes that fit well.  Check your feet often for any cuts or sores. Contact a doctor if:  You have cramps in your legs while walking.  You have leg pain when you are at rest.  You have coldness in a leg or foot.  Your skin changes.  You are unable to get or have an erection (erectile dysfunction).  You have cuts or sores on your feet that are not healing. Get help right away if:  Your arm  or leg turns cold and blue.  Your arms or legs become red, warm, swollen, painful, or numb.  You have chest pain or trouble breathing.  You suddenly have weakness in your face, arm, or leg.  You become very confused or you cannot speak.  You suddenly have a very bad headache.  You suddenly cannot see. This information is not intended to replace advice given to you by your health care provider. Make sure you discuss any questions you have with your health care provider. Document Released: 09/18/2009 Document Revised: 11/30/2015 Document Reviewed: 12/02/2013 Elsevier Interactive Patient Education  2017 [employer].

## 2016-10-02 NOTE — Addendum Note (Signed)
Addended by: Lianne Cure A on: 10/02/2016 03:13 PM   Modules accepted: Orders

## 2016-10-17 DIAGNOSIS — Z79899 Other long term (current) drug therapy: Secondary | ICD-10-CM | POA: Diagnosis not present

## 2016-10-17 DIAGNOSIS — Z0000 Encounter for general adult medical examination without abnormal findings: Secondary | ICD-10-CM | POA: Diagnosis not present

## 2016-10-17 DIAGNOSIS — Z681 Body mass index (BMI) 19 or less, adult: Secondary | ICD-10-CM | POA: Diagnosis not present

## 2016-10-17 DIAGNOSIS — E441 Mild protein-calorie malnutrition: Secondary | ICD-10-CM | POA: Diagnosis not present

## 2016-10-17 DIAGNOSIS — Z1389 Encounter for screening for other disorder: Secondary | ICD-10-CM | POA: Diagnosis not present

## 2016-10-25 ENCOUNTER — Telehealth: Payer: Self-pay

## 2016-10-25 NOTE — Telephone Encounter (Signed)
I spoke with the patient who confirmed that Dr. Gerarda Fraction is her PCP.  She stated that she saw him about 2 weeks ago and mentioned that she is planning to move to [place] soon. Duane Lope ([place])

## 2016-12-09 DIAGNOSIS — Z887 Allergy status to serum and vaccine status: Secondary | ICD-10-CM | POA: Diagnosis not present

## 2016-12-09 DIAGNOSIS — Z79899 Other long term (current) drug therapy: Secondary | ICD-10-CM | POA: Diagnosis not present

## 2016-12-09 DIAGNOSIS — Z90710 Acquired absence of both cervix and uterus: Secondary | ICD-10-CM | POA: Diagnosis not present

## 2016-12-09 DIAGNOSIS — J9622 Acute and chronic respiratory failure with hypercapnia: Secondary | ICD-10-CM | POA: Diagnosis present

## 2016-12-09 DIAGNOSIS — Z9981 Dependence on supplemental oxygen: Secondary | ICD-10-CM | POA: Diagnosis not present

## 2016-12-09 DIAGNOSIS — I739 Peripheral vascular disease, unspecified: Secondary | ICD-10-CM | POA: Diagnosis present

## 2016-12-09 DIAGNOSIS — Z881 Allergy status to other antibiotic agents status: Secondary | ICD-10-CM | POA: Diagnosis not present

## 2016-12-09 DIAGNOSIS — Z888 Allergy status to other drugs, medicaments and biological substances status: Secondary | ICD-10-CM | POA: Diagnosis not present

## 2016-12-09 DIAGNOSIS — J440 Chronic obstructive pulmonary disease with acute lower respiratory infection: Secondary | ICD-10-CM | POA: Diagnosis present

## 2016-12-09 DIAGNOSIS — R05 Cough: Secondary | ICD-10-CM | POA: Diagnosis not present

## 2016-12-09 DIAGNOSIS — Z886 Allergy status to analgesic agent status: Secondary | ICD-10-CM | POA: Diagnosis not present

## 2016-12-09 DIAGNOSIS — Z9049 Acquired absence of other specified parts of digestive tract: Secondary | ICD-10-CM | POA: Diagnosis not present

## 2016-12-09 DIAGNOSIS — M8589 Other specified disorders of bone density and structure, multiple sites: Secondary | ICD-10-CM | POA: Diagnosis not present

## 2016-12-09 DIAGNOSIS — J188 Other pneumonia, unspecified organism: Secondary | ICD-10-CM | POA: Diagnosis not present

## 2016-12-09 DIAGNOSIS — Z7902 Long term (current) use of antithrombotics/antiplatelets: Secondary | ICD-10-CM | POA: Diagnosis not present

## 2016-12-09 DIAGNOSIS — I509 Heart failure, unspecified: Secondary | ICD-10-CM | POA: Diagnosis not present

## 2016-12-09 DIAGNOSIS — Z66 Do not resuscitate: Secondary | ICD-10-CM | POA: Diagnosis not present

## 2016-12-09 DIAGNOSIS — I4891 Unspecified atrial fibrillation: Secondary | ICD-10-CM | POA: Diagnosis present

## 2016-12-09 DIAGNOSIS — J441 Chronic obstructive pulmonary disease with (acute) exacerbation: Secondary | ICD-10-CM | POA: Diagnosis present

## 2016-12-09 DIAGNOSIS — Z885 Allergy status to narcotic agent status: Secondary | ICD-10-CM | POA: Diagnosis not present

## 2016-12-09 DIAGNOSIS — J449 Chronic obstructive pulmonary disease, unspecified: Secondary | ICD-10-CM | POA: Diagnosis not present

## 2016-12-09 DIAGNOSIS — I6522 Occlusion and stenosis of left carotid artery: Secondary | ICD-10-CM | POA: Diagnosis present

## 2016-12-09 DIAGNOSIS — J189 Pneumonia, unspecified organism: Secondary | ICD-10-CM | POA: Diagnosis present

## 2016-12-09 DIAGNOSIS — E785 Hyperlipidemia, unspecified: Secondary | ICD-10-CM | POA: Diagnosis not present

## 2016-12-09 DIAGNOSIS — Z87891 Personal history of nicotine dependence: Secondary | ICD-10-CM | POA: Diagnosis not present

## 2016-12-09 DIAGNOSIS — E874 Mixed disorder of acid-base balance: Secondary | ICD-10-CM | POA: Diagnosis not present

## 2016-12-09 DIAGNOSIS — M25551 Pain in right hip: Secondary | ICD-10-CM | POA: Diagnosis not present

## 2016-12-10 DIAGNOSIS — J440 Chronic obstructive pulmonary disease with acute lower respiratory infection: Secondary | ICD-10-CM | POA: Diagnosis not present

## 2016-12-10 DIAGNOSIS — J9622 Acute and chronic respiratory failure with hypercapnia: Secondary | ICD-10-CM | POA: Diagnosis not present

## 2016-12-10 DIAGNOSIS — J441 Chronic obstructive pulmonary disease with (acute) exacerbation: Secondary | ICD-10-CM | POA: Diagnosis not present

## 2016-12-10 DIAGNOSIS — Z9981 Dependence on supplemental oxygen: Secondary | ICD-10-CM | POA: Diagnosis not present

## 2016-12-10 DIAGNOSIS — J189 Pneumonia, unspecified organism: Secondary | ICD-10-CM | POA: Diagnosis not present

## 2016-12-10 DIAGNOSIS — I4891 Unspecified atrial fibrillation: Secondary | ICD-10-CM | POA: Diagnosis not present

## 2016-12-10 DIAGNOSIS — I739 Peripheral vascular disease, unspecified: Secondary | ICD-10-CM | POA: Diagnosis not present

## 2016-12-18 DIAGNOSIS — I739 Peripheral vascular disease, unspecified: Secondary | ICD-10-CM | POA: Diagnosis not present

## 2016-12-18 DIAGNOSIS — J449 Chronic obstructive pulmonary disease, unspecified: Secondary | ICD-10-CM | POA: Diagnosis not present

## 2016-12-18 DIAGNOSIS — J189 Pneumonia, unspecified organism: Secondary | ICD-10-CM | POA: Diagnosis not present

## 2016-12-18 DIAGNOSIS — I10 Essential (primary) hypertension: Secondary | ICD-10-CM | POA: Diagnosis not present

## 2016-12-18 DIAGNOSIS — I480 Paroxysmal atrial fibrillation: Secondary | ICD-10-CM | POA: Diagnosis not present

## 2016-12-18 DIAGNOSIS — I639 Cerebral infarction, unspecified: Secondary | ICD-10-CM | POA: Diagnosis not present

## 2016-12-30 DIAGNOSIS — J189 Pneumonia, unspecified organism: Secondary | ICD-10-CM | POA: Diagnosis not present

## 2016-12-30 DIAGNOSIS — K623 Rectal prolapse: Secondary | ICD-10-CM | POA: Diagnosis not present

## 2016-12-30 DIAGNOSIS — J449 Chronic obstructive pulmonary disease, unspecified: Secondary | ICD-10-CM | POA: Diagnosis not present

## 2017-01-17 DIAGNOSIS — Z961 Presence of intraocular lens: Secondary | ICD-10-CM | POA: Diagnosis not present

## 2017-01-17 DIAGNOSIS — H353113 Nonexudative age-related macular degeneration, right eye, advanced atrophic without subfoveal involvement: Secondary | ICD-10-CM | POA: Diagnosis not present

## 2017-01-17 DIAGNOSIS — D3131 Benign neoplasm of right choroid: Secondary | ICD-10-CM | POA: Diagnosis not present

## 2017-01-17 DIAGNOSIS — H353221 Exudative age-related macular degeneration, left eye, with active choroidal neovascularization: Secondary | ICD-10-CM | POA: Diagnosis not present

## 2017-01-29 DIAGNOSIS — K623 Rectal prolapse: Secondary | ICD-10-CM | POA: Diagnosis not present

## 2017-01-29 DIAGNOSIS — J449 Chronic obstructive pulmonary disease, unspecified: Secondary | ICD-10-CM | POA: Diagnosis not present

## 2017-01-29 DIAGNOSIS — H6093 Unspecified otitis externa, bilateral: Secondary | ICD-10-CM | POA: Diagnosis not present

## 2017-02-13 IMAGING — CT CT CHEST W/ CM
2 of 4 series · 16 of 46 positions shown, 18 images · IV contrast (isovue)
Comparison: 02/23/2010

CLINICAL DATA: Abnormal CXR. Pt has been sob but is unable to
verbalize time frame. Pt has hx of COPD and irregular heart beat.

EXAM:
CT CHEST WITH CONTRAST
TECHNIQUE: Multidetector CT imaging of the chest was performed during
intravenous contrast administration.
CONTRAST:  75 cc Isovue 300

[Series 2: routine chest w without · axial · non-contrast · 0.58mm/px · z∈[-116,+150]mm · 13 of 155 slices shown, 15 images]
[im 11/155  soft-tissue]
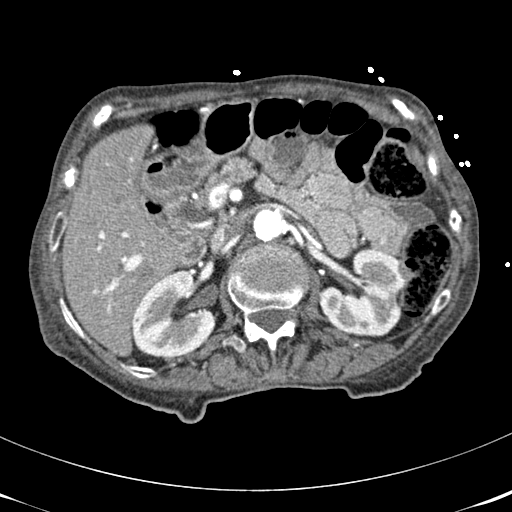
[im 11/155  bone]
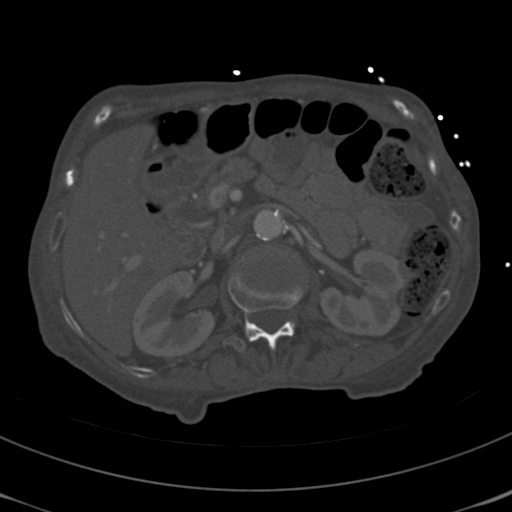
[im 21/155  soft-tissue]
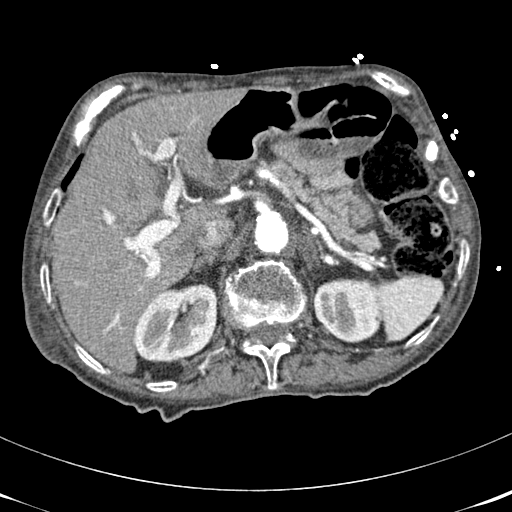
[im 31/155  soft-tissue]
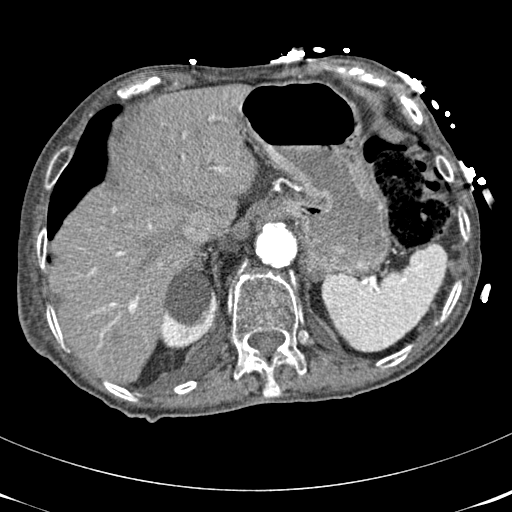
[im 42/155  soft-tissue]
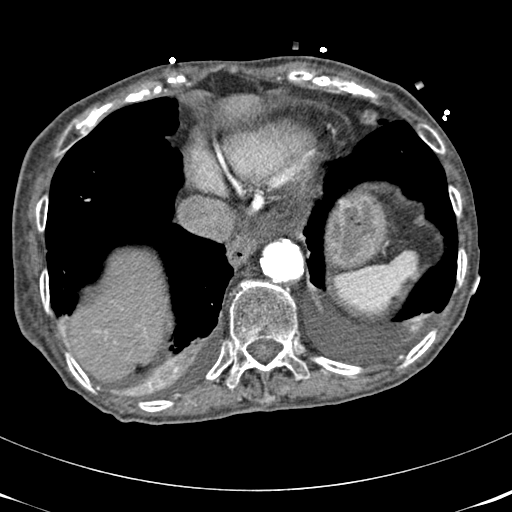
[im 52/155  soft-tissue]
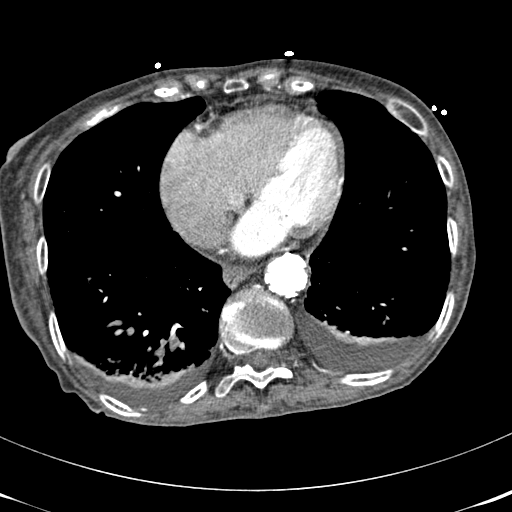
[im 62/155  soft-tissue]
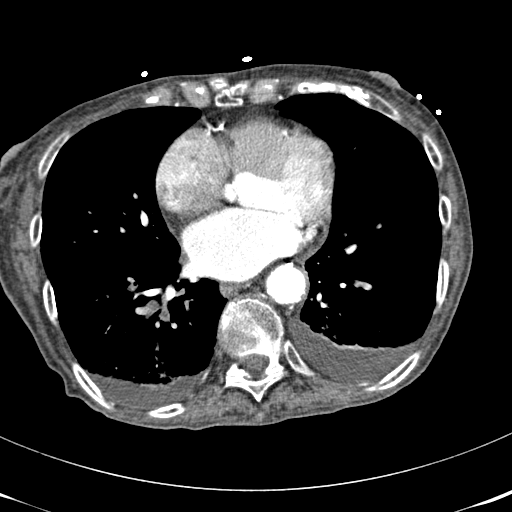
[im 83/155  soft-tissue]
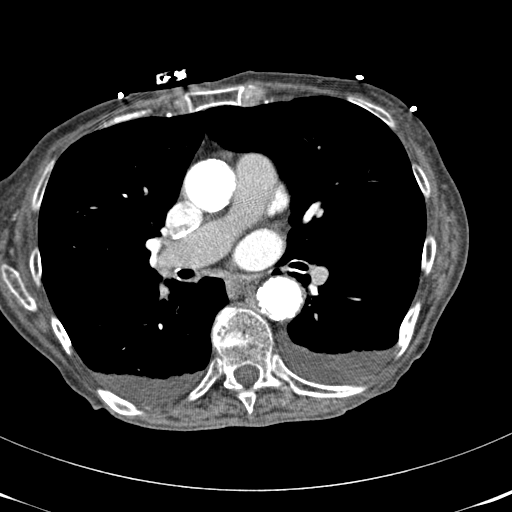
[im 93/155  soft-tissue]
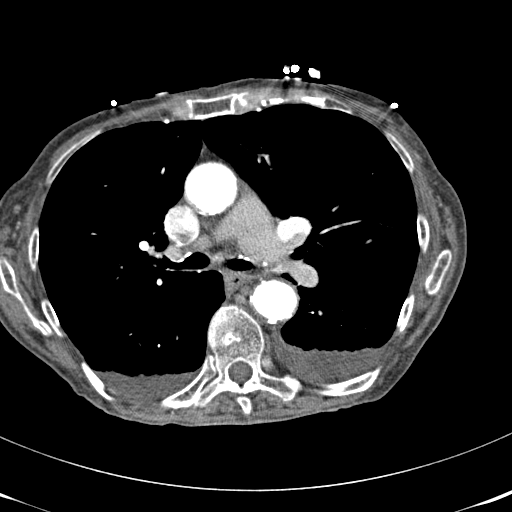
[im 103/155  soft-tissue]
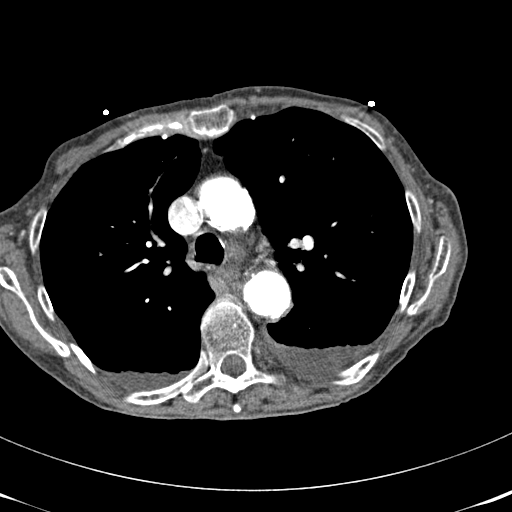
[im 103/155  bone]
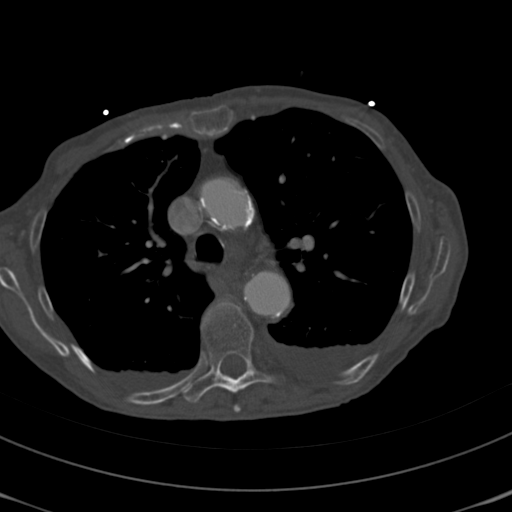
[im 113/155  soft-tissue]
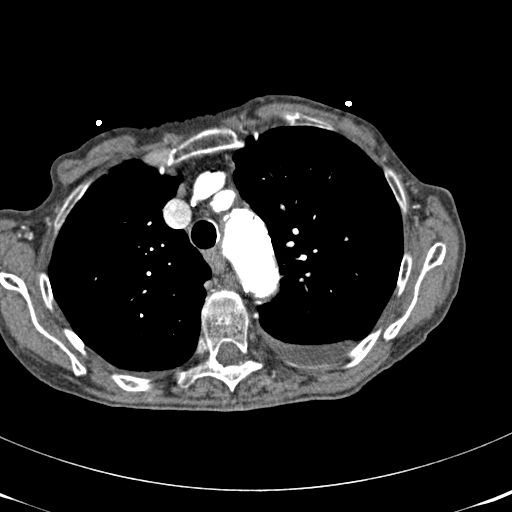
[im 124/155  soft-tissue]
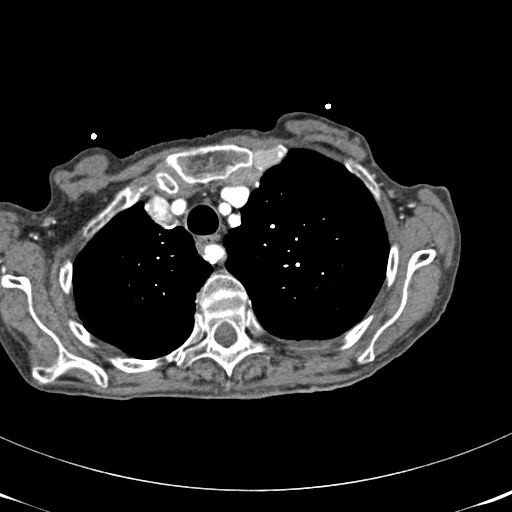
[im 134/155  soft-tissue]
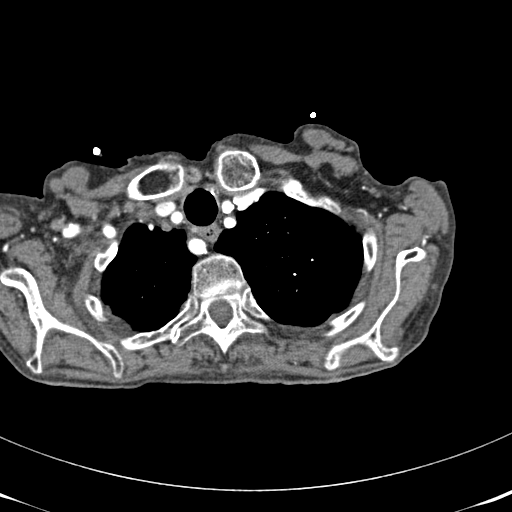
[im 144/155  soft-tissue]
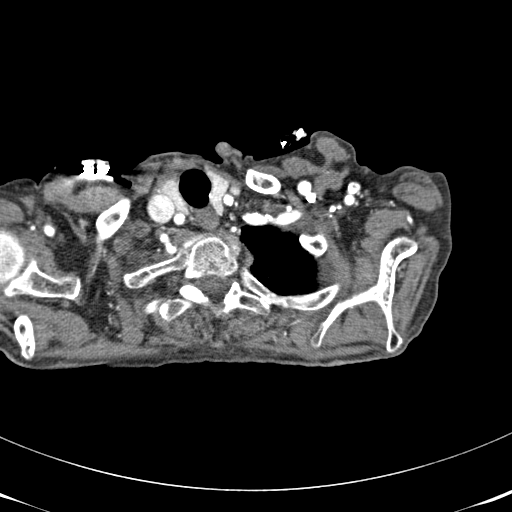

[Series 4: coronal · coronal · 0.56mm/px · 3 of 109 slices shown]
[im 37/109  soft-tissue]
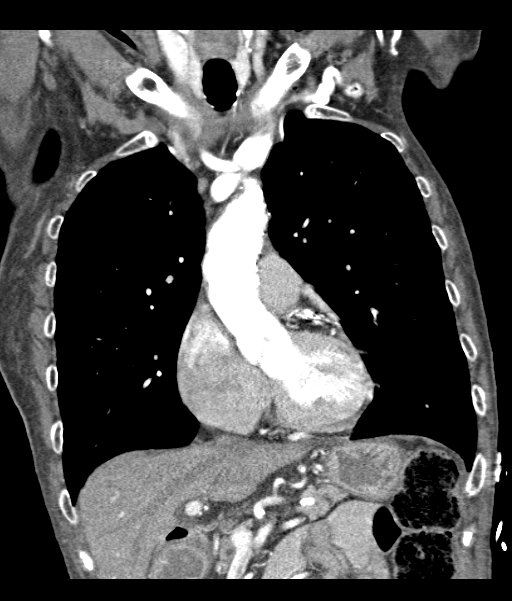
[im 49/109  soft-tissue]
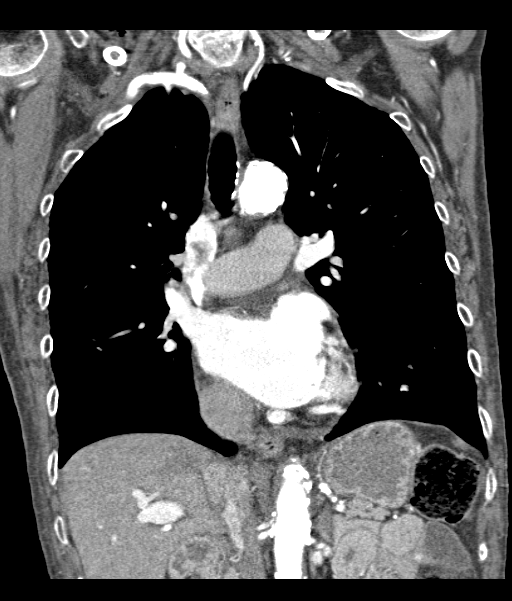
[im 61/109  soft-tissue]
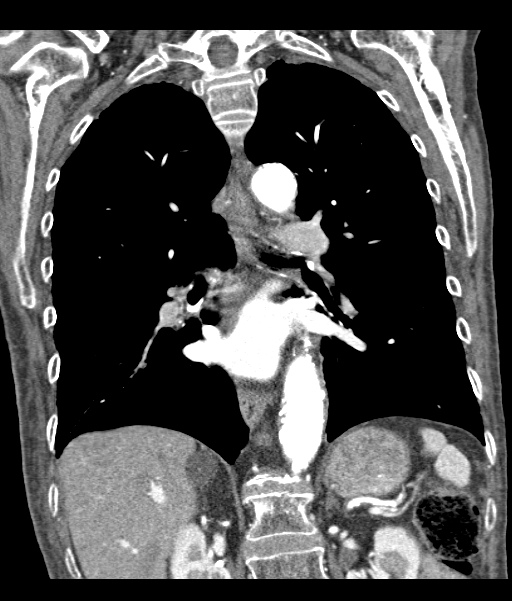

[16 of 46 positions shown; findings below may reference images not displayed]

FINDINGS: Cardiovascular: The heart is enlarged. Significant coronary artery
calcifications are present. Trace pericardial effusion. There is
aberrant arch anatomy. Aberrant right subclavian this significant
atherosclerotic calcification at its origin. There is a common trunk
for both common carotid arteries and a separate left subclavian
artery. Extensive atherosclerotic calcification of the thoracic
aorta not associated with aneurysm or dissection. Study was not
performed for evaluation of the pulmonary arteries.

Mediastinum/Nodes: The visualized portion of the thyroid gland has a
normal appearance. Small, nonspecific mediastinal lymph nodes.
Pericardial fluid is identified in the superior pericardial
recesses. The esophagus is normal in appearance.

Lungs/Pleura: Fluid attenuation is identified within right lower
lobe bronchi. No enhancing mass is identified in this region. Other
bronchi appear patent. There is bibasilar atelectasis. Small
bilateral pleural effusions are also present.

Upper Abdomen: Bilateral renal cysts.

Musculoskeletal: Mild degenerative changes are seen in the spine.
IMPRESSION: 1. Mucus plugs or aspiration within right lower lobe bronchi.
2. Bilateral pleural effusions and bibasilar atelectasis.
3. Significant coronary artery disease.
4.  Aortic atherosclerosis.
5. Apparent arch anatomy.
6. Bilateral renal cysts.

## 2017-02-13 IMAGING — CR DG CHEST 1V PORT
1 series · 1 of 1 positions shown · non-contrast
Comparison: Portable exam 6233 hours compared 02/06/2016

CLINICAL DATA: Irregular heartbeat, shortness of breath, history
COPD, atrial fibrillation, hypertension

EXAM:
PORTABLE CHEST 1 VIEW

[ap portable]
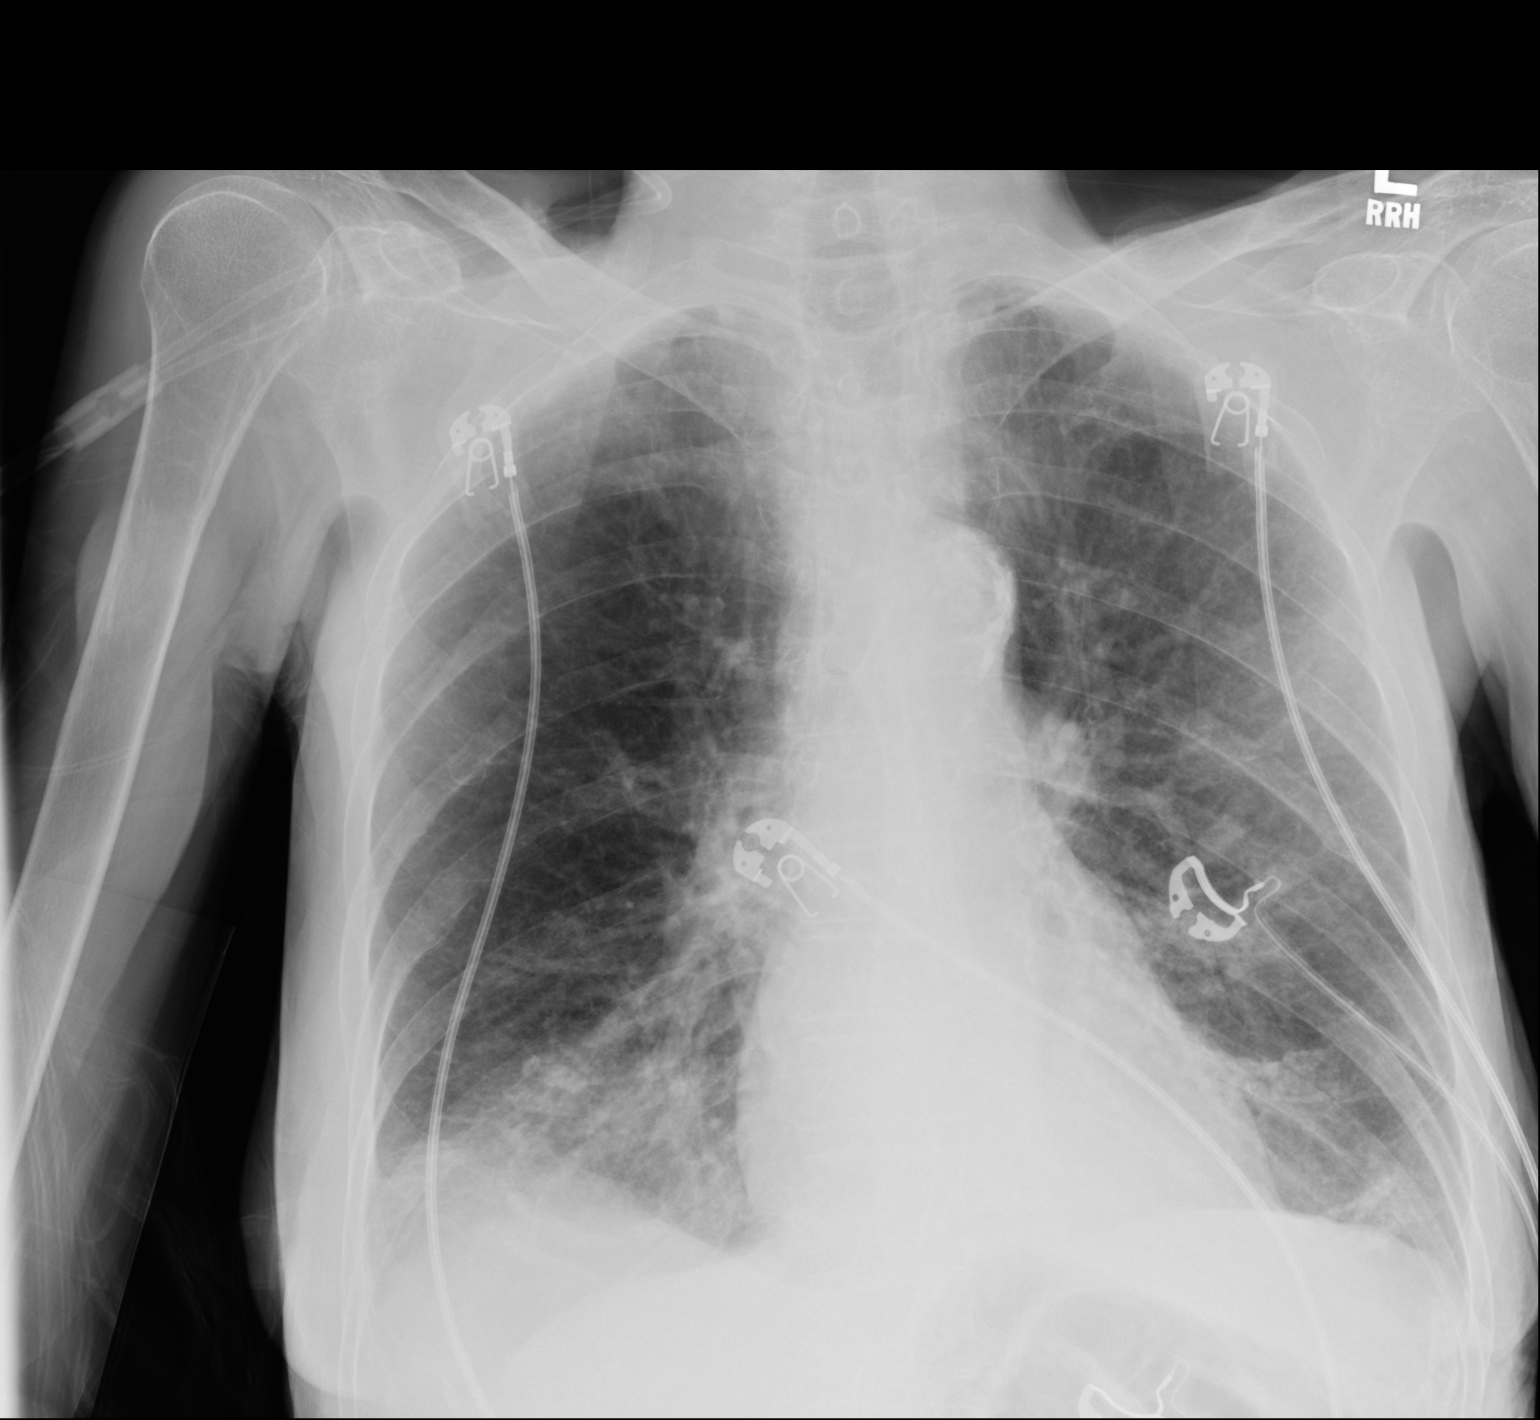

[1 of 1 positions shown; findings below may reference images not displayed]

FINDINGS: Normal heart size and mediastinal contours.

Atherosclerotic calcification aorta.

Slight pulmonary vascular congestion.

Increased atelectasis versus consolidation in RIGHT lower lobe.

Remaining lungs demonstrate chronic changes and question underlying
COPD.

No acute infiltrate, pleural effusion or pneumothorax.

Bones demineralized.
IMPRESSION: Probable COPD changes with increased atelectasis versus
consolidation in RIGHT lower lobe.

## 2017-02-17 DIAGNOSIS — F329 Major depressive disorder, single episode, unspecified: Secondary | ICD-10-CM | POA: Diagnosis not present

## 2017-04-10 DIAGNOSIS — N390 Urinary tract infection, site not specified: Secondary | ICD-10-CM | POA: Diagnosis not present

## 2017-04-10 DIAGNOSIS — I119 Hypertensive heart disease without heart failure: Secondary | ICD-10-CM | POA: Diagnosis not present

## 2017-04-10 DIAGNOSIS — M546 Pain in thoracic spine: Secondary | ICD-10-CM | POA: Diagnosis not present

## 2017-04-10 DIAGNOSIS — I2510 Atherosclerotic heart disease of native coronary artery without angina pectoris: Secondary | ICD-10-CM | POA: Diagnosis not present

## 2017-04-10 DIAGNOSIS — K5900 Constipation, unspecified: Secondary | ICD-10-CM | POA: Diagnosis not present

## 2017-04-10 DIAGNOSIS — J9610 Chronic respiratory failure, unspecified whether with hypoxia or hypercapnia: Secondary | ICD-10-CM | POA: Diagnosis not present

## 2017-04-10 DIAGNOSIS — Z886 Allergy status to analgesic agent status: Secondary | ICD-10-CM | POA: Diagnosis not present

## 2017-04-10 DIAGNOSIS — Z885 Allergy status to narcotic agent status: Secondary | ICD-10-CM | POA: Diagnosis not present

## 2017-04-10 DIAGNOSIS — J439 Emphysema, unspecified: Secondary | ICD-10-CM | POA: Diagnosis not present

## 2017-04-10 DIAGNOSIS — J80 Acute respiratory distress syndrome: Secondary | ICD-10-CM | POA: Diagnosis not present

## 2017-04-10 DIAGNOSIS — Z87891 Personal history of nicotine dependence: Secondary | ICD-10-CM | POA: Diagnosis not present

## 2017-04-10 DIAGNOSIS — J449 Chronic obstructive pulmonary disease, unspecified: Secondary | ICD-10-CM | POA: Diagnosis not present

## 2017-04-10 DIAGNOSIS — M549 Dorsalgia, unspecified: Secondary | ICD-10-CM | POA: Diagnosis not present

## 2017-04-10 DIAGNOSIS — J189 Pneumonia, unspecified organism: Secondary | ICD-10-CM | POA: Diagnosis not present

## 2017-04-10 DIAGNOSIS — M1990 Unspecified osteoarthritis, unspecified site: Secondary | ICD-10-CM | POA: Diagnosis not present

## 2017-04-10 DIAGNOSIS — I739 Peripheral vascular disease, unspecified: Secondary | ICD-10-CM | POA: Diagnosis not present

## 2017-04-10 DIAGNOSIS — Z880 Allergy status to penicillin: Secondary | ICD-10-CM | POA: Diagnosis not present

## 2017-04-10 DIAGNOSIS — Z7902 Long term (current) use of antithrombotics/antiplatelets: Secondary | ICD-10-CM | POA: Diagnosis not present

## 2017-04-10 DIAGNOSIS — I4891 Unspecified atrial fibrillation: Secondary | ICD-10-CM | POA: Diagnosis not present

## 2017-04-10 DIAGNOSIS — J90 Pleural effusion, not elsewhere classified: Secondary | ICD-10-CM | POA: Diagnosis not present

## 2017-04-10 DIAGNOSIS — R109 Unspecified abdominal pain: Secondary | ICD-10-CM | POA: Diagnosis not present

## 2017-04-10 DIAGNOSIS — Z8673 Personal history of transient ischemic attack (TIA), and cerebral infarction without residual deficits: Secondary | ICD-10-CM | POA: Diagnosis not present

## 2017-04-10 DIAGNOSIS — Z23 Encounter for immunization: Secondary | ICD-10-CM | POA: Diagnosis not present

## 2017-04-10 DIAGNOSIS — Z9981 Dependence on supplemental oxygen: Secondary | ICD-10-CM | POA: Diagnosis not present

## 2017-04-10 DIAGNOSIS — I714 Abdominal aortic aneurysm, without rupture: Secondary | ICD-10-CM | POA: Diagnosis not present

## 2017-04-17 DIAGNOSIS — M545 Low back pain: Secondary | ICD-10-CM | POA: Diagnosis not present

## 2017-04-17 DIAGNOSIS — J449 Chronic obstructive pulmonary disease, unspecified: Secondary | ICD-10-CM | POA: Diagnosis not present

## 2017-04-17 DIAGNOSIS — I714 Abdominal aortic aneurysm, without rupture: Secondary | ICD-10-CM | POA: Diagnosis not present

## 2017-04-17 DIAGNOSIS — Z758 Other problems related to medical facilities and other health care: Secondary | ICD-10-CM | POA: Diagnosis not present

## 2017-04-18 DIAGNOSIS — I723 Aneurysm of iliac artery: Secondary | ICD-10-CM | POA: Diagnosis not present

## 2017-04-18 DIAGNOSIS — N281 Cyst of kidney, acquired: Secondary | ICD-10-CM | POA: Diagnosis not present

## 2017-04-18 DIAGNOSIS — I714 Abdominal aortic aneurysm, without rupture: Secondary | ICD-10-CM | POA: Diagnosis not present

## 2017-04-30 DIAGNOSIS — I714 Abdominal aortic aneurysm, without rupture: Secondary | ICD-10-CM | POA: Diagnosis not present

## 2017-04-30 DIAGNOSIS — M1990 Unspecified osteoarthritis, unspecified site: Secondary | ICD-10-CM | POA: Diagnosis not present

## 2017-04-30 DIAGNOSIS — F329 Major depressive disorder, single episode, unspecified: Secondary | ICD-10-CM | POA: Diagnosis not present

## 2017-04-30 DIAGNOSIS — I82409 Acute embolism and thrombosis of unspecified deep veins of unspecified lower extremity: Secondary | ICD-10-CM | POA: Diagnosis not present

## 2017-04-30 DIAGNOSIS — I10 Essential (primary) hypertension: Secondary | ICD-10-CM | POA: Diagnosis not present

## 2017-04-30 DIAGNOSIS — M545 Low back pain: Secondary | ICD-10-CM | POA: Diagnosis not present

## 2017-04-30 DIAGNOSIS — I739 Peripheral vascular disease, unspecified: Secondary | ICD-10-CM | POA: Diagnosis not present

## 2017-05-28 DIAGNOSIS — F329 Major depressive disorder, single episode, unspecified: Secondary | ICD-10-CM | POA: Diagnosis not present

## 2017-05-28 DIAGNOSIS — R05 Cough: Secondary | ICD-10-CM | POA: Diagnosis not present

## 2017-05-28 DIAGNOSIS — R400 Somnolence: Secondary | ICD-10-CM | POA: Diagnosis not present

## 2017-05-28 DIAGNOSIS — J449 Chronic obstructive pulmonary disease, unspecified: Secondary | ICD-10-CM | POA: Diagnosis not present

## 2017-06-03 DIAGNOSIS — Z9049 Acquired absence of other specified parts of digestive tract: Secondary | ICD-10-CM | POA: Diagnosis not present

## 2017-06-03 DIAGNOSIS — K623 Rectal prolapse: Secondary | ICD-10-CM | POA: Diagnosis not present

## 2017-06-03 DIAGNOSIS — M1990 Unspecified osteoarthritis, unspecified site: Secondary | ICD-10-CM | POA: Diagnosis not present

## 2017-06-03 DIAGNOSIS — K921 Melena: Secondary | ICD-10-CM | POA: Diagnosis not present

## 2017-06-03 DIAGNOSIS — Z881 Allergy status to other antibiotic agents status: Secondary | ICD-10-CM | POA: Diagnosis not present

## 2017-06-03 DIAGNOSIS — J45909 Unspecified asthma, uncomplicated: Secondary | ICD-10-CM | POA: Diagnosis not present

## 2017-06-03 DIAGNOSIS — K6289 Other specified diseases of anus and rectum: Secondary | ICD-10-CM | POA: Diagnosis not present

## 2017-06-03 DIAGNOSIS — Z886 Allergy status to analgesic agent status: Secondary | ICD-10-CM | POA: Diagnosis not present

## 2017-06-03 DIAGNOSIS — I10 Essential (primary) hypertension: Secondary | ICD-10-CM | POA: Diagnosis not present

## 2017-06-03 DIAGNOSIS — Z885 Allergy status to narcotic agent status: Secondary | ICD-10-CM | POA: Diagnosis not present

## 2017-06-04 DIAGNOSIS — K625 Hemorrhage of anus and rectum: Secondary | ICD-10-CM | POA: Diagnosis not present

## 2017-06-04 DIAGNOSIS — K6289 Other specified diseases of anus and rectum: Secondary | ICD-10-CM | POA: Diagnosis not present

## 2017-06-04 DIAGNOSIS — K623 Rectal prolapse: Secondary | ICD-10-CM | POA: Diagnosis not present

## 2017-06-10 DIAGNOSIS — Z79891 Long term (current) use of opiate analgesic: Secondary | ICD-10-CM | POA: Diagnosis not present

## 2017-06-10 DIAGNOSIS — K621 Rectal polyp: Secondary | ICD-10-CM | POA: Diagnosis not present

## 2017-06-10 DIAGNOSIS — Z881 Allergy status to other antibiotic agents status: Secondary | ICD-10-CM | POA: Diagnosis not present

## 2017-06-10 DIAGNOSIS — K5790 Diverticulosis of intestine, part unspecified, without perforation or abscess without bleeding: Secondary | ICD-10-CM | POA: Diagnosis not present

## 2017-06-10 DIAGNOSIS — Z87891 Personal history of nicotine dependence: Secondary | ICD-10-CM | POA: Diagnosis not present

## 2017-06-10 DIAGNOSIS — I10 Essential (primary) hypertension: Secondary | ICD-10-CM | POA: Diagnosis not present

## 2017-06-10 DIAGNOSIS — Z888 Allergy status to other drugs, medicaments and biological substances status: Secondary | ICD-10-CM | POA: Diagnosis not present

## 2017-06-10 DIAGNOSIS — K625 Hemorrhage of anus and rectum: Secondary | ICD-10-CM | POA: Diagnosis not present

## 2017-06-10 DIAGNOSIS — Z79899 Other long term (current) drug therapy: Secondary | ICD-10-CM | POA: Diagnosis not present

## 2017-06-10 DIAGNOSIS — D128 Benign neoplasm of rectum: Secondary | ICD-10-CM | POA: Diagnosis not present

## 2017-06-10 DIAGNOSIS — Z7902 Long term (current) use of antithrombotics/antiplatelets: Secondary | ICD-10-CM | POA: Diagnosis not present

## 2017-06-10 DIAGNOSIS — Z885 Allergy status to narcotic agent status: Secondary | ICD-10-CM | POA: Diagnosis not present

## 2017-06-10 DIAGNOSIS — Z9049 Acquired absence of other specified parts of digestive tract: Secondary | ICD-10-CM | POA: Diagnosis not present

## 2017-06-10 DIAGNOSIS — J45909 Unspecified asthma, uncomplicated: Secondary | ICD-10-CM | POA: Diagnosis not present

## 2017-06-10 DIAGNOSIS — K5730 Diverticulosis of large intestine without perforation or abscess without bleeding: Secondary | ICD-10-CM | POA: Diagnosis not present

## 2017-06-10 DIAGNOSIS — Z880 Allergy status to penicillin: Secondary | ICD-10-CM | POA: Diagnosis not present

## 2017-06-10 DIAGNOSIS — K623 Rectal prolapse: Secondary | ICD-10-CM | POA: Diagnosis not present

## 2017-06-10 DIAGNOSIS — K635 Polyp of colon: Secondary | ICD-10-CM | POA: Diagnosis not present

## 2017-06-10 DIAGNOSIS — Z882 Allergy status to sulfonamides status: Secondary | ICD-10-CM | POA: Diagnosis not present

## 2017-06-10 DIAGNOSIS — Z887 Allergy status to serum and vaccine status: Secondary | ICD-10-CM | POA: Diagnosis not present

## 2017-06-10 DIAGNOSIS — Z886 Allergy status to analgesic agent status: Secondary | ICD-10-CM | POA: Diagnosis not present

## 2017-06-10 DIAGNOSIS — F329 Major depressive disorder, single episode, unspecified: Secondary | ICD-10-CM | POA: Diagnosis not present

## 2017-06-10 DIAGNOSIS — D123 Benign neoplasm of transverse colon: Secondary | ICD-10-CM | POA: Diagnosis not present

## 2017-06-10 DIAGNOSIS — M1990 Unspecified osteoarthritis, unspecified site: Secondary | ICD-10-CM | POA: Diagnosis not present

## 2017-06-13 DIAGNOSIS — I4891 Unspecified atrial fibrillation: Secondary | ICD-10-CM | POA: Diagnosis not present

## 2017-06-13 DIAGNOSIS — N3000 Acute cystitis without hematuria: Secondary | ICD-10-CM | POA: Diagnosis not present

## 2017-06-13 DIAGNOSIS — I10 Essential (primary) hypertension: Secondary | ICD-10-CM | POA: Diagnosis not present

## 2017-06-25 DIAGNOSIS — K623 Rectal prolapse: Secondary | ICD-10-CM | POA: Diagnosis not present

## 2017-06-25 DIAGNOSIS — K621 Rectal polyp: Secondary | ICD-10-CM | POA: Diagnosis not present

## 2017-07-29 DIAGNOSIS — H353114 Nonexudative age-related macular degeneration, right eye, advanced atrophic with subfoveal involvement: Secondary | ICD-10-CM | POA: Diagnosis not present

## 2017-07-29 DIAGNOSIS — H353221 Exudative age-related macular degeneration, left eye, with active choroidal neovascularization: Secondary | ICD-10-CM | POA: Diagnosis not present

## 2017-07-29 DIAGNOSIS — D3132 Benign neoplasm of left choroid: Secondary | ICD-10-CM | POA: Diagnosis not present

## 2017-08-29 DIAGNOSIS — H353221 Exudative age-related macular degeneration, left eye, with active choroidal neovascularization: Secondary | ICD-10-CM | POA: Diagnosis not present

## 2017-09-30 DIAGNOSIS — H353221 Exudative age-related macular degeneration, left eye, with active choroidal neovascularization: Secondary | ICD-10-CM | POA: Diagnosis not present

## 2017-09-30 DIAGNOSIS — H353114 Nonexudative age-related macular degeneration, right eye, advanced atrophic with subfoveal involvement: Secondary | ICD-10-CM | POA: Diagnosis not present

## 2017-09-30 DIAGNOSIS — D3132 Benign neoplasm of left choroid: Secondary | ICD-10-CM | POA: Diagnosis not present

## 2017-10-02 ENCOUNTER — Encounter (HOSPITAL_COMMUNITY): Payer: Medicare Other

## 2017-10-02 ENCOUNTER — Ambulatory Visit: Payer: Medicare Other | Admitting: Family

## 2017-10-07 DIAGNOSIS — J069 Acute upper respiratory infection, unspecified: Secondary | ICD-10-CM | POA: Diagnosis not present

## 2017-10-07 DIAGNOSIS — L040 Acute lymphadenitis of face, head and neck: Secondary | ICD-10-CM | POA: Diagnosis not present

## 2017-10-16 DIAGNOSIS — J449 Chronic obstructive pulmonary disease, unspecified: Secondary | ICD-10-CM | POA: Diagnosis not present

## 2017-10-16 DIAGNOSIS — R0600 Dyspnea, unspecified: Secondary | ICD-10-CM | POA: Diagnosis not present

## 2017-10-16 DIAGNOSIS — I739 Peripheral vascular disease, unspecified: Secondary | ICD-10-CM | POA: Diagnosis not present

## 2017-11-21 ENCOUNTER — Ambulatory Visit: Payer: Medicare Other | Admitting: Family

## 2017-11-21 ENCOUNTER — Encounter (HOSPITAL_COMMUNITY): Payer: Medicare Other

## 2018-09-06 DEATH — deceased
